# Patient Record
Sex: Female | Born: 1935 | Race: White | Hispanic: No | Marital: Single | Smoking: Never smoker
Health system: Southern US, Community
[De-identification: ages and names within clinical notes are randomized; demographics above are authoritative.]

## PROBLEM LIST (undated history)

## (undated) DIAGNOSIS — K219 Gastro-esophageal reflux disease without esophagitis: Secondary | ICD-10-CM

## (undated) DIAGNOSIS — I4819 Other persistent atrial fibrillation: Secondary | ICD-10-CM

## (undated) DIAGNOSIS — E785 Hyperlipidemia, unspecified: Secondary | ICD-10-CM

## (undated) DIAGNOSIS — R011 Cardiac murmur, unspecified: Secondary | ICD-10-CM

## (undated) DIAGNOSIS — I471 Supraventricular tachycardia, unspecified: Secondary | ICD-10-CM

## (undated) DIAGNOSIS — I5030 Unspecified diastolic (congestive) heart failure: Secondary | ICD-10-CM

## (undated) DIAGNOSIS — E039 Hypothyroidism, unspecified: Secondary | ICD-10-CM

## (undated) DIAGNOSIS — I10 Essential (primary) hypertension: Secondary | ICD-10-CM

## (undated) DIAGNOSIS — M1990 Unspecified osteoarthritis, unspecified site: Secondary | ICD-10-CM

## (undated) DIAGNOSIS — R928 Other abnormal and inconclusive findings on diagnostic imaging of breast: Secondary | ICD-10-CM

## (undated) HISTORY — DX: Unspecified osteoarthritis, unspecified site: M19.90

## (undated) HISTORY — DX: Hypothyroidism, unspecified: E03.9

## (undated) HISTORY — DX: Supraventricular tachycardia, unspecified: I47.10

## (undated) HISTORY — DX: Hyperlipidemia, unspecified: E78.5

## (undated) HISTORY — DX: Unspecified diastolic (congestive) heart failure: I50.30

## (undated) HISTORY — DX: Supraventricular tachycardia: I47.1

## (undated) HISTORY — DX: Other persistent atrial fibrillation: I48.19

## (undated) HISTORY — DX: Essential (primary) hypertension: I10

## (undated) HISTORY — DX: Gastro-esophageal reflux disease without esophagitis: K21.9

## (undated) HISTORY — DX: Other abnormal and inconclusive findings on diagnostic imaging of breast: R92.8

## (undated) HISTORY — DX: Cardiac murmur, unspecified: R01.1

---
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
## 1983-08-16 DIAGNOSIS — R011 Cardiac murmur, unspecified: Secondary | ICD-10-CM

## 1983-08-16 HISTORY — DX: Cardiac murmur, unspecified: R01.1

## 2000-08-15 DIAGNOSIS — M1990 Unspecified osteoarthritis, unspecified site: Secondary | ICD-10-CM

## 2000-08-15 HISTORY — DX: Unspecified osteoarthritis, unspecified site: M19.90

## 2003-08-16 DIAGNOSIS — I10 Essential (primary) hypertension: Secondary | ICD-10-CM

## 2003-08-16 HISTORY — DX: Essential (primary) hypertension: I10

## 2007-08-16 HISTORY — PX: OTHER SURGICAL HISTORY: SHX169

## 2008-08-15 HISTORY — PX: COLONOSCOPY: SHX174

## 2008-11-18 ENCOUNTER — Ambulatory Visit: Payer: Self-pay | Admitting: Cardiology

## 2008-11-18 ENCOUNTER — Ambulatory Visit: Payer: Self-pay | Admitting: Ophthalmology

## 2008-12-01 ENCOUNTER — Ambulatory Visit: Payer: Self-pay | Admitting: Ophthalmology

## 2008-12-13 HISTORY — PX: BREAST SURGERY: SHX581

## 2009-03-16 ENCOUNTER — Ambulatory Visit: Payer: Self-pay | Admitting: Ophthalmology

## 2009-08-15 DIAGNOSIS — R928 Other abnormal and inconclusive findings on diagnostic imaging of breast: Secondary | ICD-10-CM

## 2009-08-15 HISTORY — DX: Other abnormal and inconclusive findings on diagnostic imaging of breast: R92.8

## 2009-09-03 ENCOUNTER — Ambulatory Visit: Payer: Self-pay | Admitting: Unknown Physician Specialty

## 2011-08-16 HISTORY — PX: BREAST BIOPSY: SHX20

## 2011-08-16 HISTORY — PX: BREAST SURGERY: SHX581

## 2012-05-14 ENCOUNTER — Ambulatory Visit: Payer: Self-pay | Admitting: General Surgery

## 2012-05-16 LAB — PATHOLOGY REPORT

## 2012-08-15 HISTORY — PX: BREAST EXCISIONAL BIOPSY: SUR124

## 2012-10-13 ENCOUNTER — Encounter: Payer: Self-pay | Admitting: *Deleted

## 2012-10-13 DIAGNOSIS — I10 Essential (primary) hypertension: Secondary | ICD-10-CM | POA: Insufficient documentation

## 2012-11-22 ENCOUNTER — Encounter: Payer: Self-pay | Admitting: General Surgery

## 2012-11-22 NOTE — Progress Notes (Signed)
Quick Note:  I spoke with the attending radiologist the day of the procedure. She is concerned about the possibility of this malignancy in spite of multiple biopsies. Will review the option for formal excision with needle localization with the patient at her followup appointment. ______

## 2012-11-28 ENCOUNTER — Ambulatory Visit: Payer: Self-pay | Admitting: General Surgery

## 2012-12-05 ENCOUNTER — Ambulatory Visit (INDEPENDENT_AMBULATORY_CARE_PROVIDER_SITE_OTHER): Payer: Medicare Other | Admitting: General Surgery

## 2012-12-05 ENCOUNTER — Encounter: Payer: Self-pay | Admitting: General Surgery

## 2012-12-05 VITALS — BP 138/72 | HR 74 | Resp 16 | Ht 66.0 in | Wt 172.0 lb

## 2012-12-05 DIAGNOSIS — R928 Other abnormal and inconclusive findings on diagnostic imaging of breast: Secondary | ICD-10-CM | POA: Insufficient documentation

## 2012-12-05 NOTE — Patient Instructions (Addendum)
Patient to have left breast surgery to be scheduled.   This patient's surgery has been scheduled for 01-11-13 at [facility]. She is aware of date, time, and instructions.  It is okay for patient to continue 81 mg aspirin.

## 2012-12-05 NOTE — Progress Notes (Signed)
Patient ID: Meagan Wilcox, female   DOB: 07-May-1931, 79 y.o.   MRN: [id number]  Chief Complaint  Patient presents with  . Follow-up    mammogram    HPI GLENDALE Wilcox is a 79 y.o. female who presents for a follow up mammogram. The most recent mammogram was done on 11/21/12 at [facility] with birad category 5. The patient had a left breast stereotactic biopsy done in September 2013 which was benign. The patient does not performs regular self breast checks but does gets regular mammograms done. She denies any pain, swelling, discharge, lumps, or bumps in her breasts at this time. No known family history of breast problems.  HPI  Past Medical History  Diagnosis Date  . Hypertension 2005  . Heart murmur 1985  . Arthritis 2002  . Abnormal mammogram, unspecified 2011    Past Surgical History  Procedure Laterality Date  . Appendectomy  1939  . Colonoscopy  2010    Dr. Mechele Collin  . Cataract surgery  Bilateral 2009  . Left breast biopsy   2009  . Breast surgery Left 2013    stereotactic biopsy     History reviewed. No pertinent family history.  Social History History  Substance Use Topics  . Smoking status: Never Smoker   . Smokeless tobacco: Not on file  . Alcohol Use: No    Allergies  Allergen Reactions  . Amoxicillin Diarrhea  . Penicillins Rash    Current Outpatient Prescriptions  Medication Sig Dispense Refill  . amitriptyline (ELAVIL) 25 MG tablet Take 25 mg by mouth at bedtime.      Marland Kitchen aspirin 81 MG chewable tablet Chew 81 mg by mouth daily.      . Cholecalciferol (VITAMIN D3) 1000 UNIT/SPRAY LIQD Take by mouth.      . levothyroxine (SYNTHROID, LEVOTHROID) 100 MCG tablet Take 100 mcg by mouth daily.      Marland Kitchen omeprazole (PRILOSEC) 20 MG capsule Take 1 capsule by mouth daily.      . pentosan polysulfate (ELMIRON) 100 MG capsule Take 100 mg by mouth 3 (three) times daily before meals.      . simvastatin (ZOCOR) 20 MG tablet Take 20 mg by mouth every evening.      .  valsartan (DIOVAN) 40 MG tablet Take 40 mg by mouth daily.       No current facility-administered medications for this visit.    Review of Systems Review of Systems  Constitutional: Negative.   Respiratory: Negative.   Cardiovascular: Negative.     Blood pressure 138/72, pulse 74, resp. rate 16, height 5\' 6"  (1.676 m), weight 172 lb (78.019 kg).  Physical Exam Physical Exam  Constitutional: She appears well-developed and well-nourished.  Neck: Trachea normal. No mass and no thyromegaly present.  Cardiovascular: Normal rate, regular rhythm, normal heart sounds and normal pulses.   No murmur heard. Pulmonary/Chest: Effort normal and breath sounds normal. Right breast exhibits no inverted nipple, no mass, no nipple discharge, no skin change and no tenderness. Left breast exhibits no inverted nipple, no mass, no nipple discharge, no skin change and no tenderness.  Right breast 1/2 cup size bigger than left.   Lymphadenopathy:    She has no cervical adenopathy.    She has no axillary adenopathy.    Data Reviewed The November 21, 2012 left breast mammogram was reviewed as well as the written pathology report. I had spoken with the attending radiologist the day of the procedure. [id number].  Assessment  Left breast density, concerning for malignancy, possible missed biopsy.     Plan    I've recommended that we go ahead and proceed with a wire localization to confirm or dismiss the concerns for malignancy. The patient is in excellent health, and she is found the stereotactic procedure 3 particularly taxing. We'll have the films printed from [facility] prior to her biopsy.    This patient's surgery has been scheduled for 01-11-13 at [facility]. She is aware of date, time, and instructions. It is okay for patient to continue 81 mg aspirin.    [name] 12/05/2012, 10:00 PM   Cc: Corky Downs, M.D.

## 2012-12-23 ENCOUNTER — Other Ambulatory Visit: Payer: Self-pay | Admitting: General Surgery

## 2012-12-23 DIAGNOSIS — R928 Other abnormal and inconclusive findings on diagnostic imaging of breast: Secondary | ICD-10-CM

## 2012-12-27 ENCOUNTER — Ambulatory Visit: Payer: Self-pay | Admitting: General Surgery

## 2013-01-02 ENCOUNTER — Telehealth: Payer: Self-pay | Admitting: *Deleted

## 2013-01-02 NOTE — Telephone Encounter (Signed)
Message was left for patient to call the office.  We need to reschedule surgery that was scheduled for 01-11-13 at [facility] due to Dr. Rutherford Nail request (injury).

## 2013-01-04 ENCOUNTER — Telehealth: Payer: Self-pay | Admitting: *Deleted

## 2013-01-04 NOTE — Telephone Encounter (Signed)
Patient aware of the need to reschedule surgery per Dr. Rutherford Nail request (due to injury). This patient's surgery has been moved from 01-11-13 to 01-25-13. She is aware of all instructions.  Leah in the [facility] notified as well as Kim in mammography.

## 2013-01-25 DIAGNOSIS — R928 Other abnormal and inconclusive findings on diagnostic imaging of breast: Secondary | ICD-10-CM

## 2013-01-28 ENCOUNTER — Encounter: Payer: Self-pay | Admitting: General Surgery

## 2013-03-05 ENCOUNTER — Encounter: Payer: Self-pay | Admitting: General Surgery

## 2014-02-18 ENCOUNTER — Encounter: Payer: Self-pay | Admitting: General Surgery

## 2014-02-21 ENCOUNTER — Encounter: Payer: Self-pay | Admitting: General Surgery

## 2014-02-24 ENCOUNTER — Ambulatory Visit (INDEPENDENT_AMBULATORY_CARE_PROVIDER_SITE_OTHER): Payer: Medicare Other | Admitting: General Surgery

## 2014-02-24 ENCOUNTER — Other Ambulatory Visit: Payer: Medicare Other

## 2014-02-24 ENCOUNTER — Encounter: Payer: Self-pay | Admitting: General Surgery

## 2014-02-24 VITALS — BP 140/78 | HR 78 | Resp 14 | Ht 66.0 in | Wt 172.0 lb

## 2014-02-24 DIAGNOSIS — N63 Unspecified lump in unspecified breast: Secondary | ICD-10-CM

## 2014-02-24 DIAGNOSIS — R920 Mammographic microcalcification found on diagnostic imaging of breast: Secondary | ICD-10-CM

## 2014-02-24 HISTORY — PX: BREAST BIOPSY: SHX20

## 2014-02-24 NOTE — Progress Notes (Addendum)
Patient ID: Meagan Wilcox, female   DOB: 04-20-1931, 80 y.o.   MRN: [id number]  Chief Complaint  Patient presents with  . Follow-up    mammogram    HPI Meagan Wilcox is a 80 y.o. female. who presents for a breast evaluation. The most recent mammogram was done on 02/11/14 at [facility]. Patient does not perform regular self breast checks but does get regular mammograms done. She denies any problems with the breasts at this time.   The patient was originally evaluated in September 2013 for density in the left breast. Stereotactic biopsy completed 05/14/2012 showed benign tissue with focal fibrosis, columnar cell changes and dilated duct. A postbiopsy imaging suggested that the clip at least was superior to the area of original mammographic concern.  The patient was seen again in April 2014 in regards to an area of architectural distortion in the left breast. Attempt to identify this was unsuccessful as the area of concern could not be identified during standard mammograms for needle localization. At that point it was elected to have a one year followup, culminating in today's exam.  Since her last visit the radiologist reported that there was a developing density in the area of microcalcifications and an ultrasound completed at the imaging facility suggested an area amenable to biopsy. Also, microcalcifications have been identified in the right breast, new from past exams.   HPI  Past Medical History  Diagnosis Date  . Hypertension 2005  . Heart murmur 1985  . Arthritis 2002  . Abnormal mammogram, unspecified 2011    Past Surgical History  Procedure Laterality Date  . Appendectomy  1939  . Colonoscopy  2010    Dr. Mechele Collin  . Cataract surgery  Bilateral 2009  . Left breast biopsy   2009  . Breast surgery Left 2013    stereotactic biopsy     No family history on file.  Social History History  Substance Use Topics  . Smoking status: Never Smoker   . Smokeless tobacco: Not on file  .  Alcohol Use: No    Allergies  Allergen Reactions  . Amoxicillin Diarrhea  . Penicillins Rash    Current Outpatient Prescriptions  Medication Sig Dispense Refill  . amitriptyline (ELAVIL) 25 MG tablet Take 25 mg by mouth at bedtime.      Marland Kitchen aspirin 81 MG chewable tablet Chew 81 mg by mouth daily.      Marland Kitchen levothyroxine (SYNTHROID, LEVOTHROID) 100 MCG tablet Take 100 mcg by mouth daily.      . Multiple Vitamin (MULTIVITAMIN) tablet Take 1 tablet by mouth daily.      Marland Kitchen omeprazole (PRILOSEC) 20 MG capsule Take 1 capsule by mouth daily.      . pentosan polysulfate (ELMIRON) 100 MG capsule Take 100 mg by mouth 2 (two) times daily.       . simvastatin (ZOCOR) 20 MG tablet Take 20 mg by mouth every evening.      . valsartan (DIOVAN) 40 MG tablet Take 40 mg by mouth daily.       No current facility-administered medications for this visit.    Review of Systems Review of Systems  Constitutional: Negative.   Respiratory: Negative.   Cardiovascular: Negative.     Blood pressure 140/78, pulse 78, resp. rate 14, height 5\' 6"  (1.676 m), weight 172 lb (78.019 kg).  Physical Exam Physical Exam  Constitutional: She is oriented to person, place, and time. She appears well-developed and well-nourished.  Neck: Neck supple. No thyromegaly present.  Cardiovascular: Normal rate and regular rhythm.   Murmur heard.  Systolic murmur is present with a grade of 1/6  Pulmonary/Chest: Effort normal and breath sounds normal. Right breast exhibits no inverted nipple, no mass, no nipple discharge, no skin change and no tenderness. Left breast exhibits no inverted nipple, no mass, no nipple discharge, no skin change and no tenderness.  Lymphadenopathy:    She has no cervical adenopathy.    She has no axillary adenopathy.  Neurological: She is alert and oriented to person, place, and time.  Skin: Skin is warm and dry.    Data Reviewed Bilateral mammograms dated 02/11/2014 were reviewed. Architectural  distortion  Assessment    Right breast examination showed microcalcifications in the upper-outer quadrant and determined suspicion for malignancy. These were not evident on her 2014 study.  Left breast examination suggested a spiculated mass with an ultrasound correlation.    Plan    Ultrasound examination of the left breast at the 6:00 position, 4 cm from the nipple showed an area of hypoechoic tissue measuring 1 x 1 x 0.76 cm. The patient was amenable to a vacuum biopsy. 10 cc of 0.5% Xylocaine with 0.25% Marcaine with 1 200,000 units epinephrine was utilized for local anesthesia well tolerated. A 10-gauge Encor device was then advanced through the area an 8 core samples obtained. A small fluid collection was noted postbiopsy and this was controlled with direct pressure with no subsequent hematoma. Postbiopsy clip was placed. The skin defect was closed with benzoin and Steri-Strips followed by Telfa and Tegaderm dressing.  The patient was provided with written instructions in regards to wound care postbiopsy. Depending on pathology, will discuss proceeding with right breast stereotactic biopsy.      PCP: Meryle ReadyMasoud, Javed    Gisel Vipond W 02/25/2014, 3:28 PM

## 2014-02-24 NOTE — Patient Instructions (Signed)

## 2014-02-25 ENCOUNTER — Encounter: Payer: Self-pay | Admitting: General Surgery

## 2014-02-25 DIAGNOSIS — R920 Mammographic microcalcification found on diagnostic imaging of breast: Secondary | ICD-10-CM | POA: Insufficient documentation

## 2014-02-26 ENCOUNTER — Telehealth: Payer: Self-pay | Admitting: General Surgery

## 2014-02-26 ENCOUNTER — Telehealth: Payer: Self-pay | Admitting: *Deleted

## 2014-02-26 ENCOUNTER — Encounter: Payer: Self-pay | Admitting: General Surgery

## 2014-02-26 ENCOUNTER — Ambulatory Visit (INDEPENDENT_AMBULATORY_CARE_PROVIDER_SITE_OTHER): Payer: Self-pay | Admitting: General Surgery

## 2014-02-26 VITALS — BP 126/70 | HR 80 | Resp 12 | Ht 66.0 in | Wt 171.0 lb

## 2014-02-26 DIAGNOSIS — R920 Mammographic microcalcification found on diagnostic imaging of breast: Secondary | ICD-10-CM

## 2014-02-26 DIAGNOSIS — N63 Unspecified lump in unspecified breast: Secondary | ICD-10-CM

## 2014-02-26 LAB — PATHOLOGY

## 2014-02-26 NOTE — Telephone Encounter (Signed)
Pathology benign. Patient reports a few drops of blood. She will come in this afternoon for a wound check and to discuss contralateral biopsy.

## 2014-02-26 NOTE — Progress Notes (Signed)
Patient ID: Silvestre Mesilsie M Erpelding, female   DOB: 01-11-1931, 80 y.o.   MRN: [id number]  Chief Complaint  Patient presents with  . Follow-up    HPI Silvestre Mesilsie M Bouchillon is a 80 y.o. female.Pt had a biopsy on left breast on Monday July 13th, when she puts on her bra, each morning she has a little bit of blood on it, she stated not much just a few drops.   HPI  Past Medical History  Diagnosis Date  . Hypertension 2005  . Heart murmur 1985  . Arthritis 2002  . Abnormal mammogram, unspecified 2011    Past Surgical History  Procedure Laterality Date  . Appendectomy  1939  . Colonoscopy  2010    Dr. Mechele CollinElliott  . Cataract surgery  Bilateral 2009  . Left breast biopsy   2009  . Breast surgery Left 2013    stereotactic biopsy   . Breast surgery Left May 2010    1 mm foci of atypical ductal hyperplasia  . Breast biopsy Left 02-24-14    No family history on file.  Social History History  Substance Use Topics  . Smoking status: Never Smoker   . Smokeless tobacco: Not on file  . Alcohol Use: No    Allergies  Allergen Reactions  . Amoxicillin Diarrhea  . Penicillins Rash    Current Outpatient Prescriptions  Medication Sig Dispense Refill  . amitriptyline (ELAVIL) 25 MG tablet Take 25 mg by mouth at bedtime.      Marland Kitchen. aspirin 81 MG chewable tablet Chew 81 mg by mouth daily.      Marland Kitchen. levothyroxine (SYNTHROID, LEVOTHROID) 100 MCG tablet Take 100 mcg by mouth daily.      . Multiple Vitamin (MULTIVITAMIN) tablet Take 1 tablet by mouth daily.      Marland Kitchen. omeprazole (PRILOSEC) 20 MG capsule Take 1 capsule by mouth daily.      . pentosan polysulfate (ELMIRON) 100 MG capsule Take 100 mg by mouth 2 (two) times daily.       . simvastatin (ZOCOR) 20 MG tablet Take 20 mg by mouth every evening.      . valsartan (DIOVAN) 40 MG tablet Take 40 mg by mouth daily.       No current facility-administered medications for this visit.    Review of Systems Review of Systems  Constitutional: Negative.   Respiratory:  Negative.     Blood pressure 126/70, pulse 80, resp. rate 12, height 5\' 6"  (1.676 m), weight 171 lb (77.565 kg).  Physical Exam Physical Exam  Constitutional: She is oriented to person, place, and time. She appears well-developed and well-nourished.  Pulmonary/Chest:  Significant amount of bruising noted left breast  Neurological: She is alert and oriented to person, place, and time.  Skin: Skin is warm and dry.    Data Reviewed Diagnosis: LEFT BREAST 6:00, BIOPSY: - BENIGN BREAST TISSUE WITH COLUMNAR CELL CHANGE, APOCRINE METAPLASIA, AND FOCAL HYALINE FIBROSIS. - AREAS OF FIBROADIPOSE TISSUE, COULD BE COMPATIBLE WITH LIPOMA DEPENDING ON THE CLINICAL IMPRESSION. - NEGATIVE FOR ATYPIA AND MALIGNANCY. Comment These findings [facility]. Arvella NighByrnetts office at 11:40 AM on 02/26/14. RUB/02/26/2014    Assessment    Benign biopsy, unable to confirm mammographic abnormality due to his extensive bruising    Plan    Biopsy of the right breast microcalcifications in postbiopsy mammogram on the left will need to be postponed pending resolution of the significant bruising and ecchymosis noted today.  Arrangements will made for followup examination  one month. The patient is encouraged to make use of local heat for comfort and more rapid resolution of the anastomosis.      PCP: Meryle Ready 02/27/2014, 3:27 PM

## 2014-02-26 NOTE — Patient Instructions (Addendum)
Continue self breast exams. Call office for any new breast issues or concerns. Use heating pad to left breast to help reduce hematoma and for comfort. Wear bra for comfort and support

## 2014-02-26 NOTE — Telephone Encounter (Signed)
Pt had a BX on LT Side on Monday July 13th, when she puts on her bra, each morning she has a little bit of blood on it, she stated not much just a few drops. She was just wondering is this okay or should she come in to have it looked at.

## 2014-02-26 NOTE — Telephone Encounter (Signed)
appt for 1:30 today

## 2014-02-27 DIAGNOSIS — N63 Unspecified lump in unspecified breast: Secondary | ICD-10-CM | POA: Insufficient documentation

## 2014-03-26 ENCOUNTER — Ambulatory Visit (INDEPENDENT_AMBULATORY_CARE_PROVIDER_SITE_OTHER): Payer: Self-pay | Admitting: General Surgery

## 2014-03-26 ENCOUNTER — Encounter: Payer: Self-pay | Admitting: General Surgery

## 2014-03-26 VITALS — BP 140/72 | Wt 171.0 lb

## 2014-03-26 DIAGNOSIS — R920 Mammographic microcalcification found on diagnostic imaging of breast: Secondary | ICD-10-CM

## 2014-03-26 NOTE — Patient Instructions (Signed)
Patient advised to use a heating pad on the left breast to help resolve hematoma twice daily. Patient to return in December 2015. The patient is aware to call back for any questions or concerns.

## 2014-03-26 NOTE — Progress Notes (Signed)
Patient ID: Meagan Wilcox, female   DOB: 1931-02-02, 80 y.o.   MRN: [id number]  Chief Complaint  Patient presents with  . Follow-up    1 month follow up post op left breast biopsy    HPI Meagan Wilcox is a 80 y.o. female who presents for a post op left breast biopsy. The procedure was performed on 02/24/14. She states she is still having some tenderness in the area of the biopsy. No problems at this time.    HPI  Past Medical History  Diagnosis Date  . Hypertension 2005  . Heart murmur 1985  . Arthritis 2002  . Abnormal mammogram, unspecified 2011    Past Surgical History  Procedure Laterality Date  . Appendectomy  1939  . Colonoscopy  2010    Dr. Mechele Collin  . Cataract surgery  Bilateral 2009  . Left breast biopsy   2009  . Breast surgery Left 2013    stereotactic biopsy   . Breast surgery Left May 2010    1 mm foci of atypical ductal hyperplasia  . Breast biopsy Left 02-24-14    History reviewed. No pertinent family history.  Social History History  Substance Use Topics  . Smoking status: Never Smoker   . Smokeless tobacco: Not on file  . Alcohol Use: No    Allergies  Allergen Reactions  . Amoxicillin Diarrhea  . Penicillins Rash    Current Outpatient Prescriptions  Medication Sig Dispense Refill  . amitriptyline (ELAVIL) 25 MG tablet Take 25 mg by mouth at bedtime.      Marland Kitchen aspirin 81 MG chewable tablet Chew 81 mg by mouth daily.      Marland Kitchen levothyroxine (SYNTHROID, LEVOTHROID) 100 MCG tablet Take 100 mcg by mouth daily.      . Multiple Vitamin (MULTIVITAMIN) tablet Take 1 tablet by mouth daily.      Marland Kitchen omeprazole (PRILOSEC) 20 MG capsule Take 1 capsule by mouth daily.      . pentosan polysulfate (ELMIRON) 100 MG capsule Take 100 mg by mouth 2 (two) times daily.       . simvastatin (ZOCOR) 20 MG tablet Take 20 mg by mouth every evening.      . valsartan (DIOVAN) 40 MG tablet Take 40 mg by mouth daily.       No current facility-administered medications for this  visit.    Review of Systems Review of Systems  Constitutional: Negative.   Respiratory: Negative.   Cardiovascular: Negative.     Blood pressure 140/72, weight 171 lb (77.565 kg).  Physical Exam Physical Exam  Constitutional: She is oriented to person, place, and time. She appears well-developed and well-nourished.  Neck: Neck supple. No thyromegaly present.  Cardiovascular: Normal rate, regular rhythm and normal heart sounds.   No murmur heard. Pulmonary/Chest: Effort normal and breath sounds normal. Left breast exhibits tenderness. Left breast exhibits no inverted nipple, no mass, no nipple discharge and no skin change.    5 x 6 cm hematoma on the left breast at biopsy site.    Lymphadenopathy:    She has no cervical adenopathy.  Neurological: She is alert and oriented to person, place, and time.  Skin: Skin is warm and dry.    Data Reviewed LEFT BREAST 6:00, BIOPSY: - BENIGN BREAST TISSUE WITH COLUMNAR CELL CHANGE, APOCRINE METAPLASIA, AND FOCAL HYALINE FIBROSIS. - AREAS OF FIBROADIPOSE TISSUE, COULD BE COMPATIBLE WITH LIPOMA DEPENDING ON THE CLINICAL IMPRESSION. - NEGATIVE FOR ATYPIA AND MALIGNANCY. Comment These findings were communicated to  Michelle in Dr. Arvella NighByrnetts office at 11:40 AM on 02/26/14.    Assessment    Hematoma post biopsy.     Plan    The presence of the hematoma precludes a post biopsy mammogram to confirm the area of microcalcifications had been removed. We'll postpone reassessment until December 2015. We'll reassess at that time for post biopsy mammogram. The patient has been encouraged to resume local heat applications to assist with resolution of the hematoma.    PCP: Meryle ReadyMasoud, Javed    Zoye Chandra W 03/27/2014, 9:00 PM

## 2014-04-08 ENCOUNTER — Encounter: Payer: Self-pay | Admitting: General Surgery

## 2014-06-16 ENCOUNTER — Encounter: Payer: Self-pay | Admitting: General Surgery

## 2014-07-29 ENCOUNTER — Ambulatory Visit (INDEPENDENT_AMBULATORY_CARE_PROVIDER_SITE_OTHER): Payer: Medicare Other | Admitting: General Surgery

## 2014-07-29 ENCOUNTER — Encounter: Payer: Self-pay | Admitting: General Surgery

## 2014-07-29 VITALS — BP 160/80 | HR 64 | Resp 12 | Ht 66.0 in | Wt 174.0 lb

## 2014-07-29 DIAGNOSIS — R920 Mammographic microcalcification found on diagnostic imaging of breast: Secondary | ICD-10-CM

## 2014-07-29 NOTE — Patient Instructions (Addendum)
Continue self breast exams. Call office for any new breast issues or concerns. Follow up in 6 months with bilateral screening mammograms and office visits.

## 2014-07-29 NOTE — Progress Notes (Signed)
Patient ID: Meagan Wilcox, female   DOB: 1931-06-11, 80 y.o.   MRN: [id number]  Chief Complaint  Patient presents with  . Follow-up    HPI Meagan Mesilsie M Bidinger is a 80 y.o. female.  Here today for her follow up left breast biopsy on 02-24-14. She states the pain is usually at night 1/10 but it doesn't last long.   HPI  Past Medical History  Diagnosis Date  . Hypertension 2005  . Heart murmur 1985  . Arthritis 2002  . Abnormal mammogram, unspecified 2011    Past Surgical History  Procedure Laterality Date  . Appendectomy  1939  . Colonoscopy  2010    Dr. Mechele CollinElliott  . Cataract surgery  Bilateral 2009  . Left breast biopsy   2009  . Breast surgery Left 2013    stereotactic biopsy   . Breast surgery Left May 2010    1 mm foci of atypical ductal hyperplasia  . Breast biopsy Left 02-24-14    benign    No family history on file.  Social History History  Substance Use Topics  . Smoking status: Never Smoker   . Smokeless tobacco: Not on file  . Alcohol Use: No    Allergies  Allergen Reactions  . Amoxicillin Diarrhea  . Penicillins Rash    Current Outpatient Prescriptions  Medication Sig Dispense Refill  . amitriptyline (ELAVIL) 25 MG tablet Take 25 mg by mouth at bedtime.    Marland Kitchen. aspirin 81 MG chewable tablet Chew 81 mg by mouth daily.    Marland Kitchen. levothyroxine (SYNTHROID, LEVOTHROID) 100 MCG tablet Take 100 mcg by mouth daily.    . Multiple Vitamin (MULTIVITAMIN) tablet Take 1 tablet by mouth daily.    Marland Kitchen. omeprazole (PRILOSEC) 20 MG capsule Take 1 capsule by mouth daily.    . pentosan polysulfate (ELMIRON) 100 MG capsule Take 100 mg by mouth 2 (two) times daily.     . simvastatin (ZOCOR) 20 MG tablet Take 20 mg by mouth every evening.    . valsartan (DIOVAN) 40 MG tablet Take 40 mg by mouth daily.     No current facility-administered medications for this visit.    Review of Systems Review of Systems  Constitutional: Negative.   Respiratory: Negative.   Cardiovascular: Negative.      Blood pressure 160/80, pulse 64, resp. rate 12, height 5\' 6"  (1.676 m), weight 174 lb (78.926 kg).  Physical Exam Physical Exam  Constitutional: She is oriented to person, place, and time. She appears well-developed and well-nourished.  Neck: Neck supple.  Cardiovascular: Normal rate, regular rhythm and normal heart sounds.   Pulmonary/Chest: Effort normal and breath sounds normal. Right breast exhibits no inverted nipple, no mass, no nipple discharge, no skin change and no tenderness. Left breast exhibits tenderness. Left breast exhibits no inverted nipple, no mass, no nipple discharge and no skin change.    Tender at biopsy site left breast, left outer quadrant with 3 cm area of thickening.  Lymphadenopathy:    She has no cervical adenopathy.    She has no axillary adenopathy.  Neurological: She is alert and oriented to person, place, and time.  Skin: Skin is warm and dry.    Data Reviewed Diagnosis: LEFT BREAST 6:00, BIOPSY: - BENIGN BREAST TISSUE WITH COLUMNAR CELL CHANGE, APOCRINE METAPLASIA, AND FOCAL HYALINE FIBROSIS. - AREAS OF FIBROADIPOSE TISSUE, COULD BE COMPATIBLE WITH LIPOMA DEPENDING ON THE CLINICAL IMPRESSION. - NEGATIVE FOR ATYPIA AND MALIGNANCY.  Assessment    Benign breast exam.  Plan    The patient is still too tender to consider postbiopsy mammography. We'll plan for a screening exam in 6 months and reassess at that time.     Follow up in 6 months with bilateral screening mammograms and office visits.  PCP:  Meryle ReadyMasoud, Javed  Crist Kruszka W 07/30/2014, 5:58 AM

## 2014-12-18 ENCOUNTER — Other Ambulatory Visit: Payer: Self-pay

## 2014-12-18 DIAGNOSIS — Z1231 Encounter for screening mammogram for malignant neoplasm of breast: Secondary | ICD-10-CM

## 2015-02-09 ENCOUNTER — Encounter: Payer: Self-pay | Admitting: General Surgery

## 2015-02-10 ENCOUNTER — Encounter: Payer: Self-pay | Admitting: General Surgery

## 2015-02-10 ENCOUNTER — Ambulatory Visit (INDEPENDENT_AMBULATORY_CARE_PROVIDER_SITE_OTHER): Payer: Medicare Other | Admitting: General Surgery

## 2015-02-10 VITALS — BP 158/74 | HR 80 | Resp 14 | Ht 63.0 in | Wt 172.0 lb

## 2015-02-10 DIAGNOSIS — N63 Unspecified lump in unspecified breast: Secondary | ICD-10-CM

## 2015-02-10 DIAGNOSIS — R920 Mammographic microcalcification found on diagnostic imaging of breast: Secondary | ICD-10-CM | POA: Diagnosis not present

## 2015-02-10 NOTE — Progress Notes (Signed)
Patient ID: Meagan Wilcox, female   DOB: 1931-06-29, 81 y.o.   MRN: [id number]  Chief Complaint  Patient presents with  . Follow-up    mammogram    HPI Meagan Mesilsie M Norland is a 81 y.o. female who presents for a breast evaluation. The most recent mammogram was done on 02/02/15. She states she had added views and ultrasound 02-06-15. The patient had developed extensive ecchymosis after her vacuum biopsy. Patient does perform regular self breast checks and gets regular mammograms done.  She states the left breast is still tender but not any worse than it was in past.  HPI  Past Medical History  Diagnosis Date  . Hypertension 2005  . Heart murmur 1985  . Arthritis 2002  . Abnormal mammogram, unspecified 2011    Past Surgical History  Procedure Laterality Date  . Appendectomy  1939  . Colonoscopy  2010    Dr. Mechele CollinElliott  . Cataract surgery  Bilateral 2009  . Left breast biopsy   2009  . Breast surgery Left 2013    stereotactic biopsy   . Breast surgery Left May 2010    1 mm foci of atypical ductal hyperplasia  . Breast biopsy Left 02-24-14    benign    No family history on file.  Social History History  Substance Use Topics  . Smoking status: Never Smoker   . Smokeless tobacco: Never Used  . Alcohol Use: No    Allergies  Allergen Reactions  . Amoxicillin Diarrhea  . Penicillins Rash    Current Outpatient Prescriptions  Medication Sig Dispense Refill  . amitriptyline (ELAVIL) 25 MG tablet Take 25 mg by mouth at bedtime.    Marland Kitchen. aspirin 81 MG chewable tablet Chew 81 mg by mouth daily.    . Biotin (BIOTIN 5000) 5 MG CAPS Take by mouth daily.    Marland Kitchen. levothyroxine (SYNTHROID, LEVOTHROID) 100 MCG tablet Take 100 mcg by mouth daily.    . Multiple Vitamin (MULTIVITAMIN) tablet Take 1 tablet by mouth daily.    Marland Kitchen. omeprazole (PRILOSEC) 20 MG capsule Take 1 capsule by mouth daily.    . pentosan polysulfate (ELMIRON) 100 MG capsule Take 100 mg by mouth 2 (two) times daily.     . simvastatin  (ZOCOR) 20 MG tablet Take 20 mg by mouth every evening.    . valsartan (DIOVAN) 40 MG tablet Take 40 mg by mouth daily.     No current facility-administered medications for this visit.    Review of Systems Review of Systems  Constitutional: Negative.   Respiratory: Negative.   Cardiovascular: Negative.     Blood pressure 158/74, pulse 80, resp. rate 14, height 5\' 3"  (1.6 m), weight 172 lb (78.019 kg).  Physical Exam Physical Exam  Constitutional: She is oriented to person, place, and time. She appears well-developed and well-nourished.  HENT:  Mouth/Throat: Oropharynx is clear and moist.  Eyes: Conjunctivae are normal. No scleral icterus.  Neck: Neck supple.  Cardiovascular: Normal rate, regular rhythm and normal heart sounds.   Pulmonary/Chest: Effort normal and breath sounds normal. Right breast exhibits no inverted nipple, no mass, no nipple discharge, no skin change and no tenderness. Left breast exhibits tenderness. Left breast exhibits no inverted nipple, no mass, no nipple discharge and no skin change.  Tenderness at 6 o'clock 5 CFN left breast.  Lymphadenopathy:    She has no cervical adenopathy.    She has no axillary adenopathy.  Neurological: She is alert and oriented to person, place, and time.  Skin: Skin is warm and dry.  Psychiatric: She has a normal mood and affect.    Data Reviewed July 2015:  LEFT BREAST 6:00, BIOPSY: - BENIGN BREAST TISSUE WITH COLUMNAR CELL CHANGE, APOCRINE METAPLASIA, AND FOCAL HYALINE FIBROSIS. - AREAS OF FIBROADIPOSE TISSUE, COULD BE COMPATIBLE WITH LIPOMA DEPENDING ON THE CLINICAL IMPRESSION. - NEGATIVE FOR ATYPIA AND MALIGNANCY. Comment 2013 biopsy showed columnar cell changes without atypia.  2014/2015 mammograms recent area of persistent concern in the inferior aspect of the breast. Repeat attempted image guided biopsy showed no discernible lesion.  Recent mammogram dated 02/06/2015 completed at [facility] was reviewed.  Ultrasound reported persistent once I meter density at the 6:00 position with distortion less well appreciated. BI-RADS-4.  Assessment    No significant interval change in mammogram. Benign clinical exam.    Plan    Rose and cons of repeat biopsy were reviewed. At this time we'll plan for a follow-up 6 month mammogram.    Review previous mammograms to make sure the biopsy site was indeed the location of the mass.  The patient has been asked to return to the office in six months with a unilateral left breast diagnostic mammogram.  PCP:  Meryle Ready 02/11/2015, 8:39 PM

## 2015-02-10 NOTE — Patient Instructions (Signed)
Continue self breast exams. Call office for any new breast issues or concerns. The patient has been asked to return to the office in six months with a unilateral left breast diagnostic mammogram.

## 2015-08-05 ENCOUNTER — Encounter: Payer: Self-pay | Admitting: General Surgery

## 2015-08-06 ENCOUNTER — Ambulatory Visit (INDEPENDENT_AMBULATORY_CARE_PROVIDER_SITE_OTHER): Payer: Medicare Other | Admitting: General Surgery

## 2015-08-06 ENCOUNTER — Other Ambulatory Visit: Payer: Medicare Other

## 2015-08-06 ENCOUNTER — Encounter: Payer: Self-pay | Admitting: General Surgery

## 2015-08-06 VITALS — BP 148/74 | HR 74 | Resp 14 | Ht 64.0 in | Wt 171.0 lb

## 2015-08-06 DIAGNOSIS — N644 Mastodynia: Secondary | ICD-10-CM

## 2015-08-06 DIAGNOSIS — N63 Unspecified lump in breast: Secondary | ICD-10-CM | POA: Diagnosis not present

## 2015-08-06 DIAGNOSIS — N6320 Unspecified lump in the left breast, unspecified quadrant: Secondary | ICD-10-CM

## 2015-08-06 NOTE — Patient Instructions (Addendum)
The patient will be scheduled for a left stereotactic breast biopsy.   Patient has been scheduled for a left breast stere[facility] for [facility]31-15 at 4 pm. She will [facility] at [facility] at 3:30 pm. This patient is aware of date, time, and instructions. Patient verbalizes understanding.

## 2015-08-06 NOTE — Progress Notes (Signed)
Patient ID: Meagan Wilcox, female   DOB: June 02, 1931, 81 y.o.   MRN: [id number]  Chief Complaint  Patient presents with  . Follow-up    mammogram    HPI Meagan Mesilsie M Suire is a 81 y.o. female who presents for a breast evaluation. The most recent left breast mammogram was done on 07/2015.  Patient does perform regular self breast checks and gets regular mammograms done.    HPI  Past Medical History  Diagnosis Date  . Hypertension 2005  . Heart murmur 1985  . Arthritis 2002  . Abnormal mammogram, unspecified 2011    Past Surgical History  Procedure Laterality Date  . Appendectomy  1939  . Colonoscopy  2010    Dr. Mechele CollinElliott  . Cataract surgery  Bilateral 2009  . Left breast biopsy   2009  . Breast surgery Left 2013    stereotactic biopsy   . Breast surgery Left May 2010    1 mm foci of atypical ductal hyperplasia  . Breast biopsy Left 02-24-14    benign    No family history on file.  Social History Social History  Substance Use Topics  . Smoking status: Never Smoker   . Smokeless tobacco: Never Used  . Alcohol Use: No    Allergies  Allergen Reactions  . Amoxicillin Diarrhea  . Penicillins Rash    Current Outpatient Prescriptions  Medication Sig Dispense Refill  . amitriptyline (ELAVIL) 25 MG tablet Take 25 mg by mouth at bedtime.    Marland Kitchen. aspirin 81 MG chewable tablet Chew 81 mg by mouth daily.    Marland Kitchen. azithromycin (ZITHROMAX) 250 MG tablet     . Biotin (BIOTIN 5000) 5 MG CAPS Take by mouth daily.    Marland Kitchen. levothyroxine (SYNTHROID, LEVOTHROID) 100 MCG tablet Take 100 mcg by mouth daily.    . Multiple Vitamin (MULTIVITAMIN) tablet Take 1 tablet by mouth daily.    Marland Kitchen. omeprazole (PRILOSEC) 20 MG capsule Take 1 capsule by mouth daily.    . pentosan polysulfate (ELMIRON) 100 MG capsule Take 100 mg by mouth 2 (two) times daily.     . simvastatin (ZOCOR) 20 MG tablet Take 20 mg by mouth every evening.     No current facility-administered medications for this visit.    Review of  Systems Review of Systems  Constitutional: Negative.   Respiratory: Negative.   Cardiovascular: Negative.     Blood pressure 148/74, pulse 74, resp. rate 14, height 5\' 4"  (1.626 m), weight 171 lb (77.565 kg).  Physical Exam Physical Exam  Constitutional: She is oriented to person, place, and time. She appears well-developed and well-nourished.  Eyes: Conjunctivae are normal. No scleral icterus.  Neck: Neck supple.  Cardiovascular: Normal rate, regular rhythm and normal heart sounds.   Pulmonary/Chest: Effort normal and breath sounds normal. Right breast exhibits no inverted nipple, no mass, no nipple discharge, no skin change and no tenderness. Left breast exhibits no inverted nipple, no mass, no nipple discharge, no skin change and no tenderness.  Lymphadenopathy:    She has no cervical adenopathy.    She has no axillary adenopathy.  Neurological: She is alert and oriented to person, place, and time.  Skin: Skin is warm and dry.    Data Reviewed Left breast mammogram dated 08/04/2015 completed at [facility] was reviewed. Persistent and more prominent asymmetry in the inferior aspect of the breast. Not associated with any previously placed biopsy clips. BI-RADS-4.  Ultrasound examination of the left breast from the 3 to 6:00 position  was completed. No cystic or solid lesions were identified to correlate with the mammographic abnormality. BI-RADS-4.   Assessment    Persistent density, still likely related to previous hematoma.    Plan    Biopsy is been recommended using stereotactic technique. By index of suspicion is low but with the persistent and perhaps slightly more pronounced density noted (possibly related to resolution of the swelling with hematoma which was extensive) warrants a final answer. The patient will be scheduled for a left stereotactic breast biopsy.   Patient has been scheduled for a left breast stereotactic biopsy at [facility] for 08-31-15 at 4 pm. She will  check-in at the [facility] at 3:30 pm. This patient is aware of date, time, and instructions. Patient verbalizes understanding. This patient has been asked to discontinue aspirin 7 days prior to procedure.     PCP:  Corky Downs This information has been scribed by Ples Specter CMA.      [name] 08/07/2015, 12:09 PM

## 2015-08-07 ENCOUNTER — Other Ambulatory Visit: Payer: Self-pay | Admitting: General Surgery

## 2015-08-07 DIAGNOSIS — N6320 Unspecified lump in the left breast, unspecified quadrant: Secondary | ICD-10-CM | POA: Insufficient documentation

## 2015-08-31 ENCOUNTER — Other Ambulatory Visit: Payer: Self-pay | Admitting: General Surgery

## 2015-08-31 ENCOUNTER — Ambulatory Visit
Admission: RE | Admit: 2015-08-31 | Discharge: 2015-08-31 | Disposition: A | Discharge status: Home / Self Care | Payer: Medicare Other | Source: Ambulatory Visit | Attending: General Surgery | Admitting: General Surgery

## 2015-08-31 DIAGNOSIS — N6320 Unspecified lump in the left breast, unspecified quadrant: Secondary | ICD-10-CM

## 2015-08-31 DIAGNOSIS — R920 Mammographic microcalcification found on diagnostic imaging of breast: Secondary | ICD-10-CM | POA: Diagnosis not present

## 2015-08-31 DIAGNOSIS — N63 Unspecified lump in breast: Secondary | ICD-10-CM | POA: Insufficient documentation

## 2015-08-31 NOTE — Op Note (Signed)
STEREOTACTIC BREAST BIOPSY REPORT  Name:  [name] [facility] DOB:  1931-01-22  Vital signs:There were no vitals taken for this visit.  Lesion:  Nodular density.  Location:  Left breast, lower inner quadrant.   Prep:  ChloraPrep solution  Device:  40F Hologic device utilized  Local anesthetic:   20 cc's of 0.5% Xylocaine/0.25% Marcaine with 1:200,000 epinephrine and NaH2CO3   Patient tolerance:  Patient tolerated the procedure well.   Approach: Inferior`  Clip: Top hat  Dressing: Telfa/Tegaderm.  Ice pack applied. Written instructions provided to patient regarding wound care.   Patient advised that patient will be contacted by phone when pathology report is available.  Follow up appointment has been scheduled.      Meagan Wilcox

## 2015-09-01 ENCOUNTER — Telehealth: Payer: Self-pay | Admitting: General Surgery

## 2015-09-01 LAB — SURGICAL PATHOLOGY

## 2015-09-01 NOTE — Telephone Encounter (Signed)
The patient was notified that the biopsy completed yesterday was benign. The pathologist raised the question of a hamartoma, and I think this goes along with my clinical impression of residual changes from a hematoma. There was stromal fibrosis but no epithelial elements.  The patient reports no bruising but she has noted saturation of the gauze dressing applied yesterday. She will remove this tonight and continue to wear her bra for support.  Follow-up as previously scheduled.

## 2015-09-07 ENCOUNTER — Ambulatory Visit (INDEPENDENT_AMBULATORY_CARE_PROVIDER_SITE_OTHER): Payer: Medicare Other

## 2015-09-07 DIAGNOSIS — R920 Mammographic microcalcification found on diagnostic imaging of breast: Secondary | ICD-10-CM

## 2015-09-07 NOTE — Progress Notes (Signed)
Patient ID: Meagan Wilcox, female   DOB: June 18, 1931, 82 y.o.   MRN: [id number] Patient came in today for a wound check post stereo biopsy. The patient is aware of her pathology results. The wound is clean, with no signs of infection noted, small amount of bruising noted. Follow up as scheduled.

## 2016-02-11 HISTORY — PX: BREAST BIOPSY: SHX20

## 2016-03-14 ENCOUNTER — Other Ambulatory Visit: Payer: Self-pay | Admitting: Internal Medicine

## 2016-04-14 ENCOUNTER — Other Ambulatory Visit: Payer: Self-pay | Admitting: Internal Medicine

## 2016-05-18 ENCOUNTER — Other Ambulatory Visit: Payer: Self-pay | Admitting: Internal Medicine

## 2016-05-18 DIAGNOSIS — Z13820 Encounter for screening for osteoporosis: Secondary | ICD-10-CM

## 2016-06-01 ENCOUNTER — Telehealth: Payer: Self-pay

## 2016-06-01 NOTE — Telephone Encounter (Signed)
[facility]. She doesn't want to travel to [place].

## 2016-06-01 NOTE — Telephone Encounter (Signed)
The patient will have her mammogram done at [place].  

## 2016-06-08 ENCOUNTER — Other Ambulatory Visit: Payer: Self-pay

## 2016-06-08 DIAGNOSIS — Z1231 Encounter for screening mammogram for malignant neoplasm of breast: Secondary | ICD-10-CM

## 2016-06-15 ENCOUNTER — Other Ambulatory Visit: Payer: Self-pay | Admitting: *Deleted

## 2016-06-15 ENCOUNTER — Ambulatory Visit
Admission: RE | Admit: 2016-06-15 | Discharge: 2016-06-15 | Disposition: A | Discharge status: Home / Self Care | Payer: Self-pay | Source: Ambulatory Visit | Attending: *Deleted | Admitting: *Deleted

## 2016-06-15 DIAGNOSIS — Z9289 Personal history of other medical treatment: Secondary | ICD-10-CM

## 2016-06-21 ENCOUNTER — Ambulatory Visit
Admission: RE | Admit: 2016-06-21 | Discharge: 2016-06-21 | Disposition: A | Discharge status: Home / Self Care | Payer: Medicare Other | Source: Ambulatory Visit | Attending: Internal Medicine | Admitting: Internal Medicine

## 2016-06-21 DIAGNOSIS — M810 Age-related osteoporosis without current pathological fracture: Secondary | ICD-10-CM | POA: Diagnosis not present

## 2016-06-21 DIAGNOSIS — Z13820 Encounter for screening for osteoporosis: Secondary | ICD-10-CM | POA: Diagnosis not present

## 2016-07-26 ENCOUNTER — Ambulatory Visit
Admission: RE | Admit: 2016-07-26 | Discharge: 2016-07-26 | Disposition: A | Discharge status: Home / Self Care | Payer: Medicare Other | Source: Ambulatory Visit | Attending: General Surgery | Admitting: General Surgery

## 2016-07-26 DIAGNOSIS — Z1231 Encounter for screening mammogram for malignant neoplasm of breast: Secondary | ICD-10-CM

## 2016-08-10 ENCOUNTER — Encounter: Payer: Self-pay | Admitting: *Deleted

## 2016-08-18 ENCOUNTER — Ambulatory Visit: Payer: Medicare Other | Admitting: General Surgery

## 2016-08-30 ENCOUNTER — Encounter: Payer: Self-pay | Admitting: General Surgery

## 2016-08-30 ENCOUNTER — Ambulatory Visit (INDEPENDENT_AMBULATORY_CARE_PROVIDER_SITE_OTHER): Payer: Medicare Other | Admitting: General Surgery

## 2016-08-30 VITALS — BP 140/82 | HR 78 | Resp 14 | Ht 64.0 in | Wt 171.0 lb

## 2016-08-30 DIAGNOSIS — N6092 Unspecified benign mammary dysplasia of left breast: Secondary | ICD-10-CM | POA: Diagnosis not present

## 2016-08-30 NOTE — Patient Instructions (Addendum)
The patient is aware to call back for any questions or concerns. Patient will be asked to return to the office in one year with a bilateral screening mammogra

## 2016-08-30 NOTE — Progress Notes (Signed)
Patient ID: Meagan Mesilsie M Alas, female   DOB: September 18, 1930, 83 y.o.   MRN: [id number]  Chief Complaint  Patient presents with  . Follow-up    HPI Meagan Wilcox is a 83 y.o. female.  who presents for a breast evaluation. The most recent mammogram was done on 07-26-16.  Patient does perform regular self breast checks and gets regular mammograms done.   No new breast issues, occasional left lateral breast pain with changing positions, which is unchanged from before.  HPI  Past Medical History:  Diagnosis Date  . Abnormal mammogram, unspecified 2011  . Arthritis 2002  . Heart murmur 1985  . Hypertension 2005    Past Surgical History:  Procedure Laterality Date  . APPENDECTOMY  1939  . BREAST BIOPSY Left 02-24-14   benign  . BREAST BIOPSY Left 02/11/2016   neg  . BREAST BIOPSY Left 2013   neg  . BREAST EXCISIONAL BIOPSY Left 2014   neg  . BREAST SURGERY Left 2013   stereotactic biopsy   . BREAST SURGERY Left May 2010   1 mm foci of atypical ductal hyperplasia  . cataract surgery  Bilateral 2009  . COLONOSCOPY  2010   Dr. Mechele CollinElliott  . left breast biopsy   2009    No family history on file.  Social History Social History  Substance Use Topics  . Smoking status: Never Smoker  . Smokeless tobacco: Never Used  . Alcohol use No    Allergies  Allergen Reactions  . Amoxicillin Diarrhea  . Penicillins Rash    Current Outpatient Prescriptions  Medication Sig Dispense Refill  . amitriptyline (ELAVIL) 25 MG tablet Take 25 mg by mouth at bedtime.    Marland Kitchen. aspirin 81 MG chewable tablet Chew 81 mg by mouth daily.    . Biotin (BIOTIN 5000) 5 MG CAPS Take by mouth daily.    . Cholecalciferol (VITAMIN D3) 1000 units CAPS Take by mouth.    . levothyroxine (SYNTHROID, LEVOTHROID) 100 MCG tablet Take 100 mcg by mouth daily.    Marland Kitchen. omeprazole (PRILOSEC) 20 MG capsule Take 1 capsule by mouth daily.    . pentosan polysulfate (ELMIRON) 100 MG capsule Take 100 mg by mouth 2 (two) times daily.      . simvastatin (ZOCOR) 20 MG tablet Take 20 mg by mouth every evening.     No current facility-administered medications for this visit.     Review of Systems Review of Systems  Constitutional: Negative.   Respiratory: Negative.   Cardiovascular: Negative.     Blood pressure 140/82, pulse 78, resp. rate 14, height 5\' 4"  (1.626 m), weight 171 lb (77.6 kg).  Physical Exam Physical Exam  Constitutional: She is oriented to person, place, and time. She appears well-developed and well-nourished.  HENT:  Mouth/Throat: Oropharynx is clear and moist.  Eyes: Conjunctivae are normal. No scleral icterus.  Neck: Neck supple. Carotid bruit is not present.  Cardiovascular: Normal rate, regular rhythm and normal heart sounds.   Pulmonary/Chest: Effort normal and breath sounds normal. Right breast exhibits no inverted nipple, no mass, no nipple discharge, no skin change and no tenderness. Left breast exhibits no inverted nipple, no mass, no nipple discharge, no skin change and no tenderness.  Lymphadenopathy:    She has no cervical adenopathy.    She has no axillary adenopathy.  Neurological: She is alert and oriented to person, place, and time.  Skin: Skin is warm and dry.  Psychiatric: Her behavior is normal.    Data  Reviewed 07/26/2016 screening mammograms reviewed. BI-RADS-1.  Assessment    Unremarkable breast exam.    Plan    Repeat clinical exam and screening mammograms 1 year recommended. Patient desires to have this completed through this office.    Patient will be asked to return to the office in one year with a bilateral screening mammogram.   This information has been scribed by Dorathy Daft RN, BSN,BC.   [name] 08/31/2016, 1:38 PM

## 2016-08-31 DIAGNOSIS — N6092 Unspecified benign mammary dysplasia of left breast: Secondary | ICD-10-CM | POA: Insufficient documentation

## 2017-02-09 IMAGING — MG MM DIGITAL SCREENING BILAT W/ CAD
4 series · 4 of 4 positions shown · non-contrast
Comparison: Previous exam(s).

CLINICAL DATA: Screening.

EXAM:
DIGITAL SCREENING BILATERAL MAMMOGRAM WITH CAD

[R MLO]
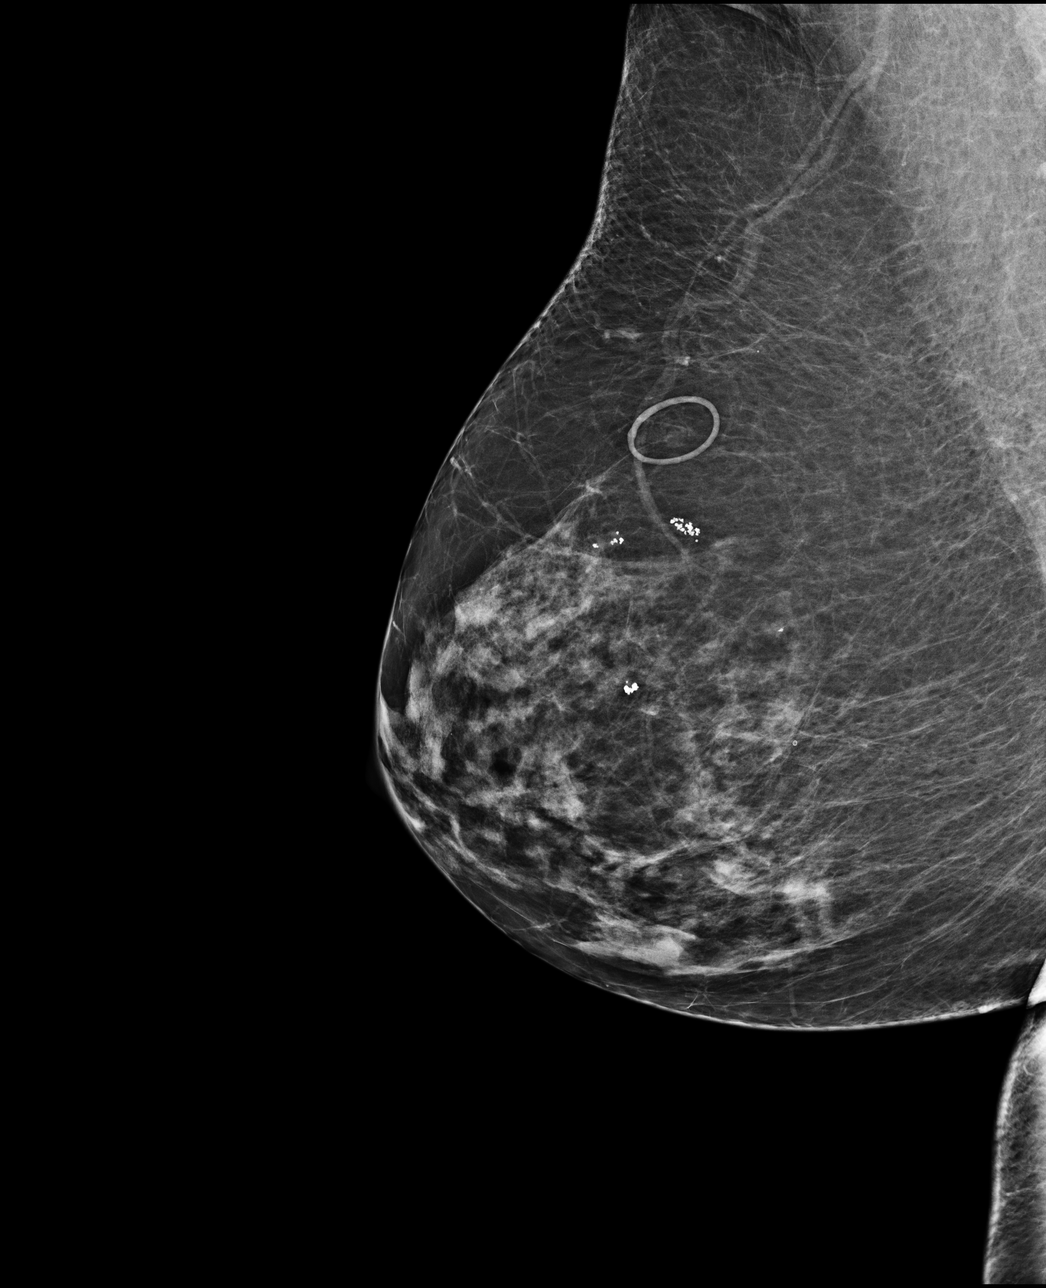

[L CC]
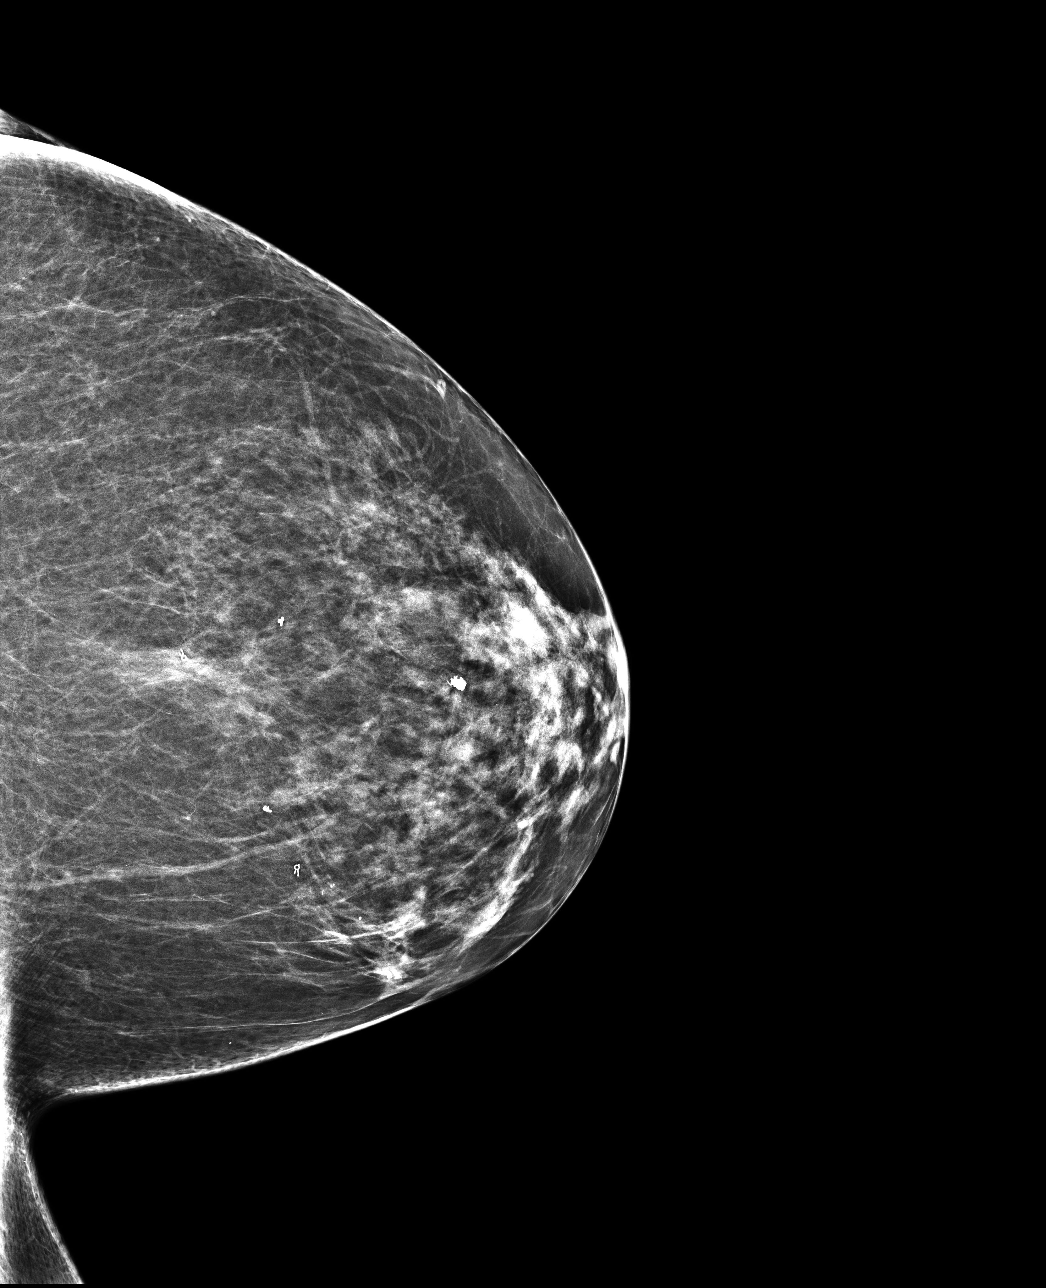

[R CC]
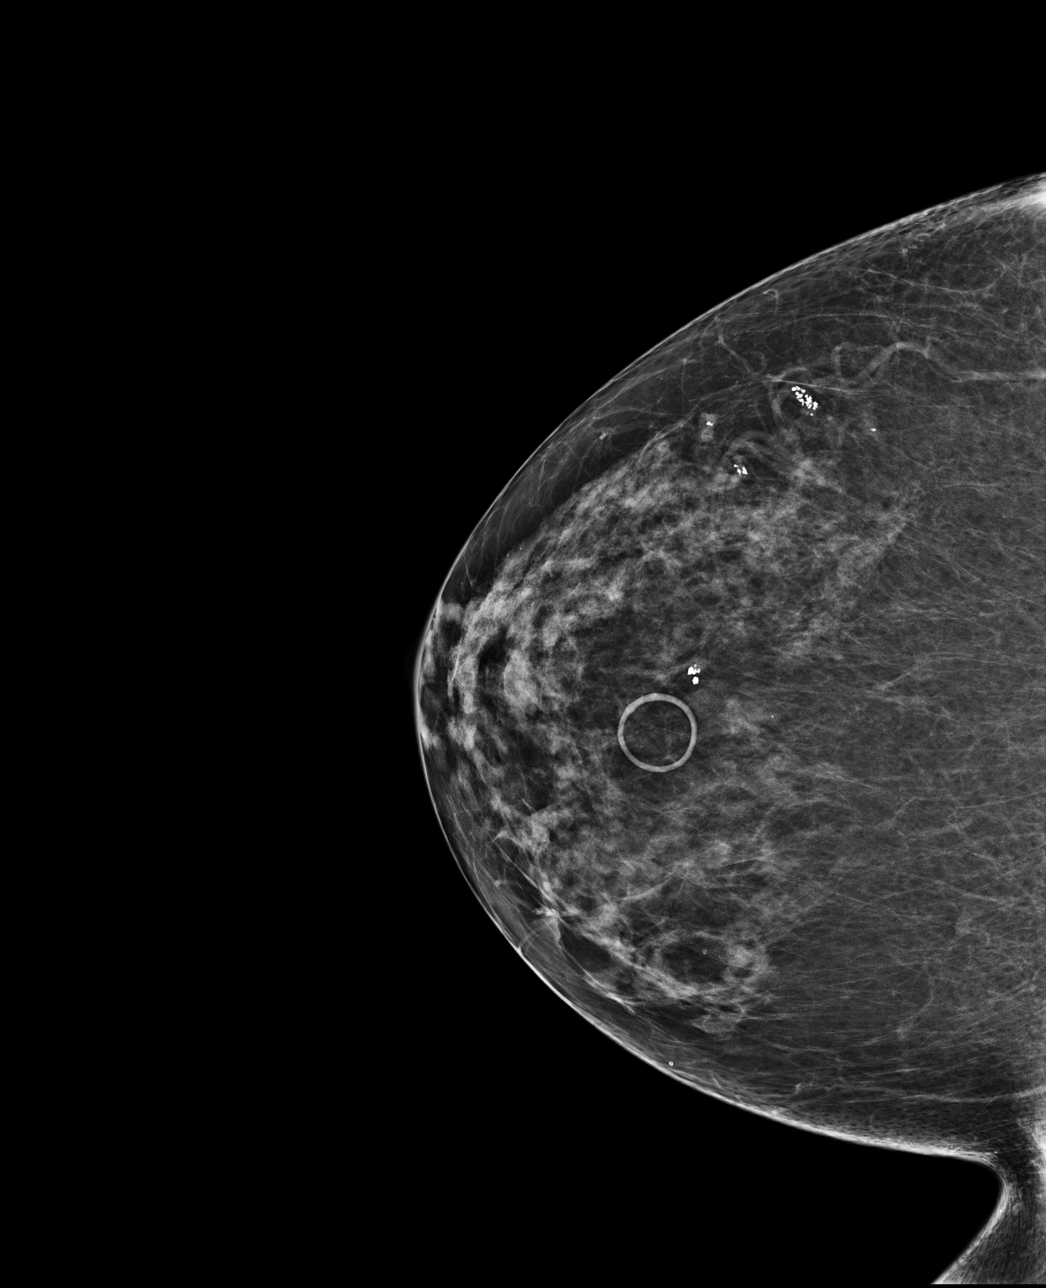

[L MLO]
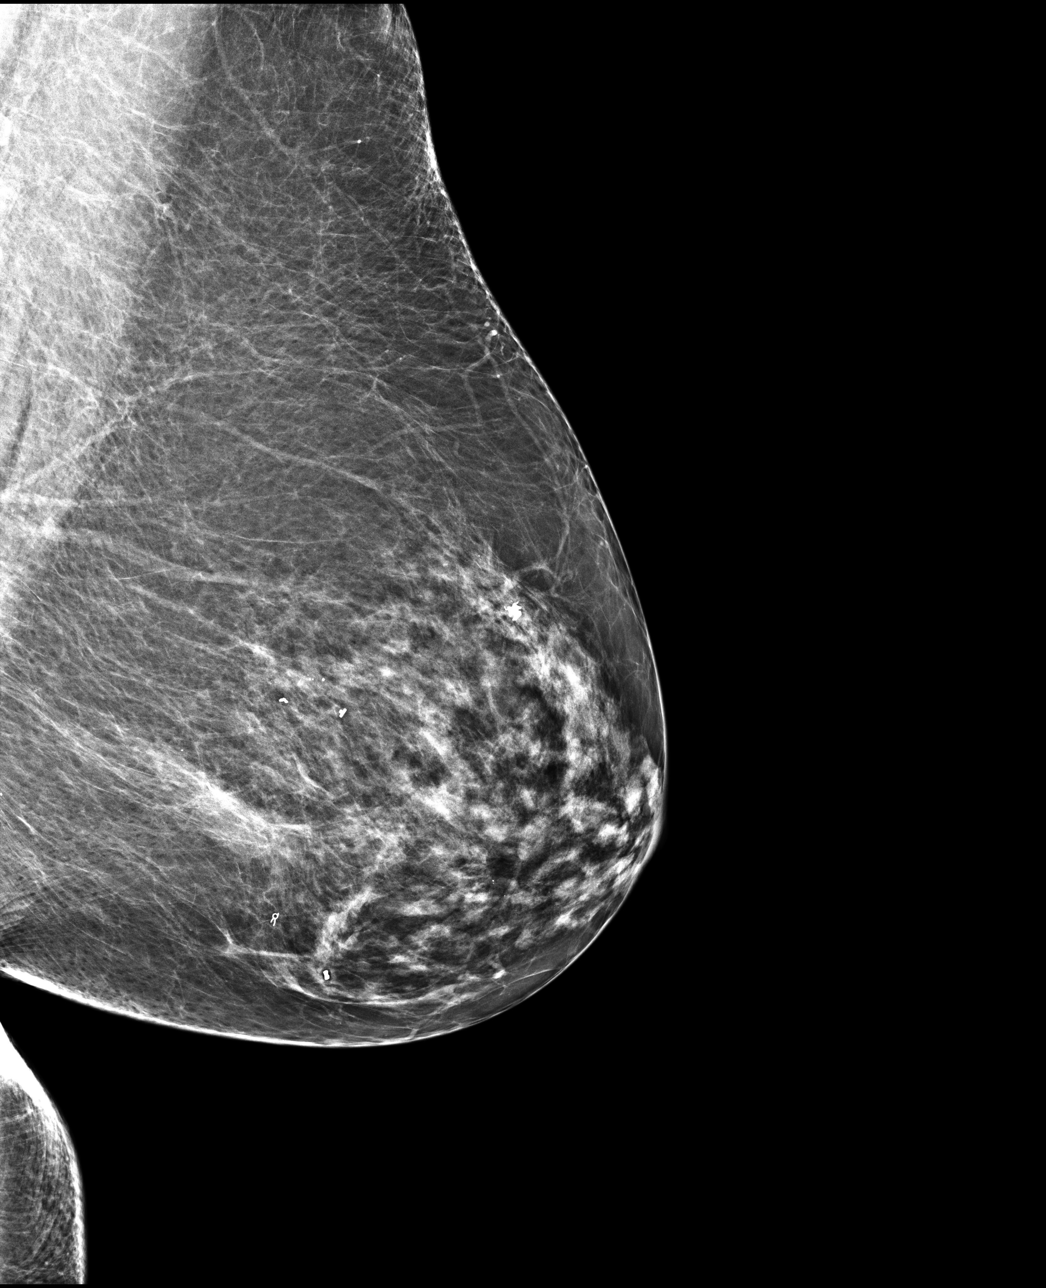

[4 of 4 positions shown; findings below may reference images not displayed]

ACR Breast Density Category b: There are scattered areas of
fibroglandular density.
FINDINGS: There are no findings suspicious for malignancy. Images were
processed with CAD.
IMPRESSION: No mammographic evidence of malignancy. A result letter of this
screening mammogram will be mailed directly to the patient.

RECOMMENDATION:
Screening mammogram in one year. (Code:AS-G-LCT)

BI-RADS CATEGORY  1: Negative.

## 2017-05-02 ENCOUNTER — Other Ambulatory Visit: Payer: Self-pay | Admitting: Student

## 2017-05-02 DIAGNOSIS — M5442 Lumbago with sciatica, left side: Secondary | ICD-10-CM

## 2017-05-04 ENCOUNTER — Ambulatory Visit
Admission: RE | Admit: 2017-05-04 | Discharge: 2017-05-04 | Disposition: A | Discharge status: Home / Self Care | Payer: Medicare Other | Source: Ambulatory Visit | Attending: Student | Admitting: Student

## 2017-05-04 DIAGNOSIS — M48061 Spinal stenosis, lumbar region without neurogenic claudication: Secondary | ICD-10-CM | POA: Insufficient documentation

## 2017-05-04 DIAGNOSIS — M5442 Lumbago with sciatica, left side: Secondary | ICD-10-CM | POA: Insufficient documentation

## 2017-05-04 DIAGNOSIS — M5136 Other intervertebral disc degeneration, lumbar region: Secondary | ICD-10-CM | POA: Insufficient documentation

## 2017-05-05 ENCOUNTER — Ambulatory Visit: Payer: Medicare Other

## 2017-05-17 ENCOUNTER — Ambulatory Visit (HOSPITAL_BASED_OUTPATIENT_CLINIC_OR_DEPARTMENT_OTHER): Payer: Medicare Other | Admitting: Student in an Organized Health Care Education/Training Program

## 2017-05-17 ENCOUNTER — Ambulatory Visit
Admission: RE | Admit: 2017-05-17 | Discharge: 2017-05-17 | Disposition: A | Discharge status: Home / Self Care | Payer: Medicare Other | Source: Ambulatory Visit | Attending: Student in an Organized Health Care Education/Training Program | Admitting: Student in an Organized Health Care Education/Training Program

## 2017-05-17 ENCOUNTER — Encounter: Payer: Self-pay | Admitting: Student in an Organized Health Care Education/Training Program

## 2017-05-17 VITALS — BP 164/59 | HR 64 | Temp 98.3°F | Resp 12 | Ht 63.0 in | Wt 170.0 lb

## 2017-05-17 DIAGNOSIS — M9983 Other biomechanical lesions of lumbar region: Secondary | ICD-10-CM

## 2017-05-17 DIAGNOSIS — M25562 Pain in left knee: Secondary | ICD-10-CM | POA: Diagnosis not present

## 2017-05-17 DIAGNOSIS — M5416 Radiculopathy, lumbar region: Secondary | ICD-10-CM | POA: Diagnosis not present

## 2017-05-17 DIAGNOSIS — M25561 Pain in right knee: Secondary | ICD-10-CM | POA: Insufficient documentation

## 2017-05-17 DIAGNOSIS — G894 Chronic pain syndrome: Secondary | ICD-10-CM | POA: Insufficient documentation

## 2017-05-17 DIAGNOSIS — Z7982 Long term (current) use of aspirin: Secondary | ICD-10-CM | POA: Insufficient documentation

## 2017-05-17 DIAGNOSIS — Z880 Allergy status to penicillin: Secondary | ICD-10-CM | POA: Insufficient documentation

## 2017-05-17 DIAGNOSIS — M5116 Intervertebral disc disorders with radiculopathy, lumbar region: Secondary | ICD-10-CM | POA: Insufficient documentation

## 2017-05-17 DIAGNOSIS — M545 Low back pain: Secondary | ICD-10-CM | POA: Diagnosis present

## 2017-05-17 DIAGNOSIS — Z79899 Other long term (current) drug therapy: Secondary | ICD-10-CM | POA: Diagnosis not present

## 2017-05-17 DIAGNOSIS — M5136 Other intervertebral disc degeneration, lumbar region: Secondary | ICD-10-CM

## 2017-05-17 DIAGNOSIS — M48061 Spinal stenosis, lumbar region without neurogenic claudication: Secondary | ICD-10-CM | POA: Diagnosis not present

## 2017-05-17 MED ORDER — LIDOCAINE HCL (PF) 1 % IJ SOLN
10.0000 mL | Freq: Once | INTRAMUSCULAR | Status: AC
  Administered 2017-05-17: 5 mL

## 2017-05-17 MED ORDER — LIDOCAINE HCL (PF) 1 % IJ SOLN
INTRAMUSCULAR | Status: AC
  Filled 2017-05-17: qty 5

## 2017-05-17 MED ORDER — IOPAMIDOL (ISOVUE-M 200) INJECTION 41%
INTRAMUSCULAR | Status: AC
  Filled 2017-05-17: qty 10

## 2017-05-17 MED ORDER — SODIUM CHLORIDE 0.9% FLUSH
2.0000 mL | Freq: Once | INTRAVENOUS | Status: AC
  Administered 2017-05-17: 10 mL

## 2017-05-17 MED ORDER — ROPIVACAINE HCL 2 MG/ML IJ SOLN
INTRAMUSCULAR | Status: AC
  Filled 2017-05-17: qty 10

## 2017-05-17 MED ORDER — IOPAMIDOL (ISOVUE-M 200) INJECTION 41%
10.0000 mL | Freq: Once | INTRAMUSCULAR | Status: AC
  Administered 2017-05-17: 10 mL via EPIDURAL
  Filled 2017-05-17: qty 10

## 2017-05-17 MED ORDER — DEXAMETHASONE SODIUM PHOSPHATE 10 MG/ML IJ SOLN
INTRAMUSCULAR | Status: AC
  Filled 2017-05-17: qty 1

## 2017-05-17 MED ORDER — SODIUM CHLORIDE 0.9 % IJ SOLN
INTRAMUSCULAR | Status: AC
  Filled 2017-05-17: qty 10

## 2017-05-17 MED ORDER — DEXAMETHASONE SODIUM PHOSPHATE 10 MG/ML IJ SOLN
10.0000 mg | Freq: Once | INTRAMUSCULAR | Status: AC
  Administered 2017-05-17: 10 mg

## 2017-05-17 MED ORDER — ROPIVACAINE HCL 2 MG/ML IJ SOLN
2.0000 mL | Freq: Once | INTRAMUSCULAR | Status: AC
  Administered 2017-05-17: 10 mL via EPIDURAL

## 2017-05-17 NOTE — Progress Notes (Signed)
Patient's Name: Meagan Wilcox  MRN: [id number]  Referring Provider: Corky Downs, MD  DOB: 11/26/1930  PCP: Corky Downs, MD  DOS: 05/17/2017  Note by: Edward Jolly, MD  Service setting: Ambulatory outpatient  Specialty: Interventional Pain Management  Patient type: Established  Location: [facility] (AMB) Pain Management Facility  Visit type: Interventional Procedure   Primary Reason for Visit: Interventional Pain Management Treatment. CC: Back Pain (low and mostley on the left); Leg Pain (left and lateral); and Knee Pain (bilateral, left is worse)  Procedure:  Anesthesia, Analgesia, Anxiolysis:  Type: Therapeutic Inter-Laminar Epidural Steroid Injection Region: Lumbar Level: L5-S1 Level. Laterality: Left         Type: Local Anesthesia Local Anesthetic: Lidocaine 1% Route: Infiltration (Rachel/IM) IV Access: Secured Sedation: Meaningful verbal contact was maintained at all times during the procedure  Indication(s): Analgesia and Anxiety   Indications: 1. Lumbar radiculopathy   2. Lumbar degenerative disc disease   3. Neuroforaminal stenosis of lumbar spine   4. Chronic pain syndrome    Pain Score: Pre-procedure: 3 /10 Post-procedure: 3 /10  Pre-op Assessment:  Meagan Wilcox is a 83 y.o. (year old), female patient, seen today for interventional treatment. She  has a past surgical history that includes Appendectomy (1939); Colonoscopy (2010); cataract surgery  (Bilateral, 2009); left breast biopsy  (2009); Breast surgery (Left, 2013); Breast surgery (Left, May 2010); Breast biopsy (Left, 02-24-14); Breast biopsy (Left, 02/11/2016); Breast excisional biopsy (Left, 2014); and Breast biopsy (Left, 2013). Meagan Wilcox has a current medication list which includes the following prescription(s): amitriptyline, aspirin, vitamin d3, gabapentin, levothyroxine, metaxalone, omeprazole, pentosan polysulfate, simvastatin, and biotin. Her primarily concern today is the Back Pain (low and mostley on the left); Leg Pain  (left and lateral); and Knee Pain (bilateral, left is worse)  Initial Vital Signs: Blood pressure (!) 168/59, pulse 64, temperature 98.3 F (36.8 C), temperature source Oral, resp. rate 18, height  (1.6 m), weight 170 lb (77.1 kg), SpO2 100 %. BMI: Estimated body mass index is 30.11 kg/m as calculated from the following:   Height as of this encounter:  (1.6 m).   Weight as of this encounter: 170 lb (77.1 kg).  Risk Assessment: Allergies: Reviewed. She is allergic to amoxicillin and penicillins.  Allergy Precautions: None required Coagulopathies: Reviewed. None identified.  Blood-thinner therapy: None at this time Active Infection(s): Reviewed. None identified. Meagan Wilcox is afebrile  Site Confirmation: Meagan Wilcox was asked to confirm the procedure and laterality before marking the site Procedure checklist: Completed Consent: Before the procedure and under the influence of no sedative(s), amnesic(s), or anxiolytics, the patient was informed of the treatment options, risks and possible complications. To fulfill our ethical and legal obligations, as recommended by the American Medical Association's Code of Ethics, I have informed the patient of my clinical impression; the nature and purpose of the treatment or procedure; the risks, benefits, and possible complications of the intervention; the alternatives, including doing nothing; the risk(s) and benefit(s) of the alternative treatment(s) or procedure(s); and the risk(s) and benefit(s) of doing nothing. The patient was provided information about the general risks and possible complications associated with the procedure. These may include, but are not limited to: failure to achieve desired goals, infection, bleeding, organ or nerve damage, allergic reactions, paralysis, and death. In addition, the patient was informed of those risks and complications associated to Spine-related procedures, such as failure to decrease pain; infection (i.e.:  Meningitis, epidural or intraspinal abscess); bleeding (i.e.: epidural hematoma, subarachnoid hemorrhage, or  any other type of intraspinal or peri-dural bleeding); organ or nerve damage (i.e.: Any type of peripheral nerve, nerve root, or spinal cord injury) with subsequent damage to sensory, motor, and/or autonomic systems, resulting in permanent pain, numbness, and/or weakness of one or several areas of the body; allergic reactions; (i.e.: anaphylactic reaction); and/or death. Furthermore, the patient was informed of those risks and complications associated with the medications. These include, but are not limited to: allergic reactions (i.e.: anaphylactic or anaphylactoid reaction(s)); adrenal axis suppression; blood sugar elevation that in diabetics may result in ketoacidosis or comma; water retention that in patients with history of congestive heart failure may result in shortness of breath, pulmonary edema, and decompensation with resultant heart failure; weight gain; swelling or edema; medication-induced neural toxicity; particulate matter embolism and blood vessel occlusion with resultant organ, and/or nervous system infarction; and/or aseptic necrosis of one or more joints. Finally, the patient was informed that Medicine is not an exact science; therefore, there is also the possibility of unforeseen or unpredictable risks and/or possible complications that may result in a catastrophic outcome. The patient indicated having understood very clearly. We have given the patient no guarantees and we have made no promises. Enough time was given to the patient to ask questions, all of which were answered to the patient's satisfaction. Meagan Wilcox has indicated that she wanted to continue with the procedure. Attestation: I, the ordering provider, attest that I have discussed with the patient the benefits, risks, side-effects, alternatives, likelihood of achieving goals, and potential problems during recovery for the  procedure that I have provided informed consent. Date: 05/17/2017; Time: 9:57 AM  Pre-Procedure Preparation:  Monitoring: As per clinic protocol. Respiration, ETCO2, SpO2, BP, heart rate and rhythm monitor placed and checked for adequate function Safety Precautions: Patient was assessed for positional comfort and pressure points before starting the procedure. Time-out: I initiated and conducted the "Time-out" before starting the procedure, as per protocol. The patient was asked to participate by confirming the accuracy of the "Time Out" information. Verification of the correct person, site, and procedure were performed and confirmed by me, the nursing staff, and the patient. "Time-out" conducted as per Joint Commission's Universal Protocol (UP.01.01.01). "Time-out" Date & Time: 05/17/2017; 1033 hrs.  Description of Procedure Process:   Position: Prone with head of the table was raised to facilitate breathing. Target Area: The interlaminar space, initially targeting the lower laminar border of the superior vertebral body. Approach: Paramedial approach. Area Prepped: Entire Posterior Lumbar Region Prepping solution: ChloraPrep (2% chlorhexidine gluconate and 70% isopropyl alcohol) Safety Precautions: Aspiration looking for blood return was conducted prior to all injections. At no point did we inject any substances, as a needle was being advanced. No attempts were made at seeking any paresthesias. Safe injection practices and needle disposal techniques used. Medications properly checked for expiration dates. SDV (single dose vial) medications used. Description of the Procedure: Protocol guidelines were followed. The procedure needle was introduced through the skin, ipsilateral to the reported pain, and advanced to the target area. Bone was contacted and the needle walked caudad, until the lamina was cleared. The epidural space was identified using "loss-of-resistance technique" with 2-3 ml of PF-NaCl  (0.9% NSS), in a 5cc LOR glass syringe. Vitals:   05/17/17 1032 05/17/17 1033 05/17/17 1037 05/17/17 1042  BP: (!) 167/71 (!) 155/61 (!) 161/60 (!) 164/59  Pulse:      Resp: Temp:      TempSrc:  SpO2: 97% 97% 98% 97%  Weight:      Height:        Start Time: 1033 hrs. End Time: 1043 hrs. Materials:  Needle(s) Type: Epidural needle Gauge: 17G Length: 3.5-in Medication(s): We administered iopamidol, ropivacaine (PF) 2 mg/mL (0.2%), sodium chloride flush, lidocaine (PF), and dexamethasone. Please see chart orders for dosing details. 5 cc of normal saline, 2 cc of 0.2% ropivacaine, 1 cc of Decadron 10 mg/cc Imaging Guidance (Spinal):  Type of Imaging Technique: Fluoroscopy Guidance (Spinal) Indication(s): Assistance in needle guidance and placement for procedures requiring needle placement in or near specific anatomical locations not easily accessible without such assistance. Exposure Time: Please see nurses notes. Contrast: Before injecting any contrast, we confirmed that the patient did not have an allergy to iodine, shellfish, or radiological contrast. Once satisfactory needle placement was completed at the desired level, radiological contrast was injected. Contrast injected under live fluoroscopy. No contrast complications. See chart for type and volume of contrast used. Fluoroscopic Guidance: I was personally present during the use of fluoroscopy. "Tunnel Vision Technique" used to obtain the best possible view of the target area. Parallax error corrected before commencing the procedure. "Direction-depth-direction" technique used to introduce the needle under continuous pulsed fluoroscopy. Once target was reached, antero-posterior, oblique, and lateral fluoroscopic projection used confirm needle placement in all planes. Images permanently stored in EMR. Interpretation: I personally interpreted the imaging intraoperatively. Adequate needle placement confirmed in multiple  planes. Appropriate spread of contrast into desired area was observed. No evidence of afferent or efferent intravascular uptake. No intrathecal or subarachnoid spread observed. Permanent images saved into the patient's record.  Antibiotic Prophylaxis:  Indication(s): None identified Antibiotic given: None  Post-operative Assessment:  EBL: None Complications: No immediate post-treatment complications observed by team, or reported by patient. Note: The patient tolerated the entire procedure well. A repeat set of vitals were taken after the procedure and the patient was kept under observation following institutional policy, for this type of procedure. Post-procedural neurological assessment was performed, showing return to baseline, prior to discharge. The patient was provided with post-procedure discharge instructions, including a section on how to identify potential problems. Should any problems arise concerning this procedure, the patient was given instructions to immediately contact us, at any time, without hesitation. In any case, we plan to contact the patient by telephone for a follow-up status report regarding this interventional procedure. Comments:  No additional relevant information.  Plan of Care  5 out of 5 strength left lower extremity: Plantar flexion, dorsiflexion, knee flexion, knee extension on discharge.  Imaging Orders     DG C-Arm 1-60 Min-No Report  Procedure Orders     Lumbar Epidural Injection  Medications ordered for procedure: Meds ordered this encounter  Medications  . iopamidol (ISOVUE-M) 41 % intrathecal injection 10 mL  . ropivacaine (PF) 2 mg/mL (0.2%) (NAROPIN) injection 2 mL  . sodium chloride flush (NS) 0.9 % injection 2 mL  . lidocaine (PF) (XYLOCAINE) 1 % injection 10 mL  . dexamethasone (DECADRON) injection 10 mg   Medications administered: We administered iopamidol, ropivacaine (PF) 2 mg/mL (0.2%), sodium chloride flush, lidocaine (PF), and  dexamethasone.  See the medical record for exact dosing, route, and time of administration.  New Prescriptions   No medications on file   Disposition: Discharge home  Discharge Date & Time: 05/17/2017; 1052 hrs.   Physician-requested Follow-up: Return in about 2 weeks (around 05/31/2017) for Post Procedure Evaluation. Future Appointments Date Time Provider Department Center  05/31/2017  11:45 AM Edward Jolly, MD [facility] None   Primary Care Physician: Corky Downs, MD Location: [facility] Note by: Edward Jolly, MD Date: 05/17/2017; Time: 2:25 PM  Disclaimer:  Medicine is not an exact science. The only guarantee in medicine is that nothing is guaranteed. It is important to note that the decision to proceed with this intervention was based on the information collected from the patient. The Data and conclusions were drawn from the patient's questionnaire, the interview, and the physical examination. Because the information was provided in large part by the patient, it cannot be guaranteed that it has not been purposely or unconsciously manipulated. Every effort has been made to obtain as much relevant data as possible for this evaluation. It is important to note that the conclusions that lead to this procedure are derived in large part from the available data. Always take into account that the treatment will also be dependent on availability of resources and existing treatment guidelines, considered by other Pain Management Practitioners as being common knowledge and practice, at the time of the intervention. For Medico-Legal purposes, it is also important to point out that variation in procedural techniques and pharmacological choices are the acceptable norm. The indications, contraindications, technique, and results of the above procedure should only be interpreted and judged by a Board-Certified Interventional Pain Specialist with extensive familiarity and expertise  in the same exact procedure and technique.

## 2017-05-17 NOTE — Patient Instructions (Signed)
Post-procedure Information What to expect: Most procedures involve the use of a local anesthetic (numbing medicine), and a steroid (anti-inflammatory medicine).  The local anesthetics may cause temporary numbness and weakness of the legs or arms, depending on the location of the block. This numbness/weakness may last 4-6 hours, depending on the local anesthetic used. In rare instances, it can last up to 24 hours. While numb, you must be very careful not to injure the extremity.  After any procedure, you could expect the pain to get better within 15-20 minutes. This relief is temporary and may last 4-6 hours. Once the local anesthetics wears off, you could experience discomfort, possibly more than usual, for up to 10 (ten) days. In the case of radiofrequencies, it may last up to 6 weeks. Surgeries may take up to 8 weeks for the healing process. The discomfort is due to the irritation caused by needles going through skin and muscle. To minimize the discomfort, we recommend using ice the first day, and heat from then on. The ice should be applied for 15 minutes on, and 15 minutes off. Keep repeating this cycle until bedtime. Avoid applying the ice directly to the skin, to prevent frostbite. Heat should be used daily, until the pain improves (4-10 days). Be careful not to burn yourself.  Occasionally you may experience muscle spasms or cramps. These occur as a consequence of the irritation caused by the needle sticks to the muscle and the blood that will inevitably be lost into the surrounding muscle tissue. Blood tends to be very irritating to tissues, which tend to react by going into spasm. These spasms may start the same day of your procedure, but they may also take days to develop. This late onset type of spasm or cramp is usually caused by electrolyte imbalances triggered by the steroids, at the level of the kidney. Cramps and spasms tend to respond well to muscle relaxants, multivitamins (some are  triggered by the procedure, but may have their origins in vitamin deficiencies), and "Gatorade", or any sports drinks that can replenish any electrolyte imbalances. (If you are a diabetic, ask your pharmacist to get you a sugar-free brand.) Warm showers or baths may also be helpful. Stretching exercises are highly recommended. General Instructions:  Be alert for signs of possible infection: redness, swelling, heat, red streaks, elevated temperature, and/or fever. These typically appear 4 to 6 days after the procedure. Immediately notify your doctor if you experience unusual bleeding, difficulty breathing, or loss of bowel or bladder control. If you experience increased pain, do not increase your pain medicine intake, unless instructed by your pain physician. Post-Procedure Care:  Be careful in moving about. Muscle spasms in the area of the injection may occur. Applying ice or heat to the area is often helpful. The incidence of spinal headaches after epidural injections ranges between 1.4% and 6%. If you develop a headache that does not seem to respond to conservative therapy, please let your physician know. This can be treated with an epidural blood patch.   Post-procedure numbness or redness is to be expected, however it should average 4 to 6 hours. If numbness and weakness of your extremities begins to develop 4 to 6 hours after your procedure, and is felt to be progressing and worsening, immediately contact your physician.   Diet:  If you experience nausea, do not eat until this sensation goes away. If you had a "Stellate Ganglion Block" for upper extremity "Reflex Sympathetic Dystrophy", do not eat or drink until your   hoarseness goes away. In any case, always start with liquids first and if you tolerate them well, then slowly progress to more solid foods. Activity:  For the first 4 to 6 hours after the procedure, use caution in moving about as you may experience numbness and/or weakness. Use caution in  cooking, using household electrical appliances, and climbing steps. If you need to reach your Doctor call our office: [phone] Monday-Thursday 8:00 am - 4:00 PM    Fridays: Closed     In case of an emergency: In case of emergency, call 911 or go to the nearest emergency room and have the physician there call us.  Interpretation of Procedure Every nerve block has two components: a diagnostic component, and a treatment component. Unrealistic expectations are the most common causes of "perceived failure".  In a perfect world, a single nerve block should be able to completely and permanently eliminate the pain. Sadly, the world is not perfect.  Most pain management nerve blocks are performed using local anesthetics and steroids. Steroids are responsible for any long-term benefit that you may experience. Their purpose is to decrease any chronic swelling that may exist in the area. Steroids begin to work immediately after being injected. However, most patients will not experience any benefits until 5 to 10 days after the injection, when the swelling has come down to the point where they can tell a difference. Steroids will only help if there is swelling to be treated. As such, they can assist with the diagnosis. If effective, they suggest an inflammatory component to the pain, and if ineffective, they rule out inflammation as the main cause or component of the problem. If the problem is one of mechanical compression, you will get no benefit from those steroids.   In the case of local anesthetics, they have a crucial role in the diagnosis of your condition. Most will begin to work within15 to 20 minutes after injection. The duration will depend on the type used (short- vs. Long-acting). It is of outmost importance that patients keep tract of their pain, after the procedure. To assist with this matter, a "Post-procedure Pain Diary" is provided. Make sure to complete it and to bring it back to your  follow-up appointment.  As long as the patient keeps accurate, detailed records of their symptoms after every procedure, and returns to have those interpreted, every procedure will provide us with invaluable information. Even a block that does not provide the patient with any relief, will always provide us with information about the mechanism and the origin of the pain. The only time a nerve block can be considered a waste of time is when patients do not keep track of the results, or do not keep their post-procedure appointment.  Reporting the results back to your physician The Pain Score  Pain is a subjective complaint. It cannot be seen, touched, or measured. We depend entirely on the patient's report of the pain in order to assess your condition and treatment. To evaluate the pain, we use a pain scale, where "0" means "No Pain", and a "10" is "the worst possible pain that you can even imagine" (i.e. something like been eaten alive by a shark or being torn apart by a lion).   You will frequently be asked to rate your pain. Please be as accurate, remember that medical decisions will be based on your responses. Please do not rate your pain above a 10. Doing so is actually interpreted as "symptom magnification" (exaggeration), as   well as lack of understanding with regards to the scale. To put this into perspective, when you tell us that your pain is at a 10 (ten), what you are saying is that there is nothing we can do to make this pain any worse. (Carefully think about that.) Epidural Steroid Injection An epidural steroid injection is given to relieve pain in your neck, back, or legs that is caused by the irritation or swelling of a nerve root. This procedure involves injecting a steroid and numbing medicine (anesthetic) into the epidural space. The epidural space is the space between the outer covering of your spinal cord and the bones that form your backbone (vertebra).  LET [facility] CARE PROVIDER  KNOW ABOUT:   Any allergies you have.  All medicines you are taking, including vitamins, herbs, eye drops, creams, and over-the-counter medicines such as aspirin.  Previous problems you or members of your family have had with the use of anesthetics.  Any blood disorders or blood clotting disorders you have.  Previous surgeries you have had.  Medical conditions you have.  RISKS AND COMPLICATIONS Generally, this is a safe procedure. However, as with any procedure, complications can occur. Possible complications of epidural steroid injection include:  Headache.  Bleeding.  Infection.  Allergic reaction to the medicines.  Damage to your nerves. The response to this procedure depends on the underlying cause of the pain and its duration. People who have long-term (chronic) pain are less likely to benefit from epidural steroids than are those people whose pain comes on strong and suddenly.  BEFORE THE PROCEDURE   Ask your health care provider about changing or stopping your regular medicines. You may be advised to stop taking blood-thinning medicines a few days before the procedure.  You may be given medicines to reduce anxiety.  Arrange for someone to take you home after the procedure.  PROCEDURE   You will remain awake during the procedure. You may receive medicine to make you relaxed.  You will be asked to lie on your stomach.  The injection site will be cleaned.  The injection site will be numbed with a medicine (local anesthetic).  A needle will be injected through your skin into the epidural space.  Your health care provider will use an X-ray machine to ensure that the steroid is delivered closest to the affected nerve. You may have minimal discomfort at this time.  Once the needle is in the right position, the local anesthetic and the steroid will be injected into the epidural space.  The needle will then be removed and a bandage will be applied to the injection  site.  AFTER THE PROCEDURE   You may be monitored for a short time before you go home.  You may feel weakness or numbness in your arm or leg, which disappears within hours.  You may be allowed to eat, drink, and take your regular medicine.  You may have soreness at the site of the injection.   This information is not intended to replace advice given to you by your health care provider. Make sure you discuss any questions you have with your health care provider.   Document Released: 11/08/2007 Document Revised: 04/03/2013 Document Reviewed: 01/18/2013 Elsevier Interactive Patient Education 2016 [employer]  GENERAL RISKS AND COMPLICATIONS  What are the risk, side effects and possible complications? Generally speaking, most procedures are safe.  However, with any procedure there are risks, side effects, and the possibility of complications.  The risks and complications  are dependent upon the sites that are lesioned, or the type of nerve block to be performed.  The closer the procedure is to the spine, the more serious the risks are.  Great care is taken when placing the radio frequency needles, block needles or lesioning probes, but sometimes complications can occur. Infection: Any time there is an injection through the skin, there is a risk of infection.  This is why sterile conditions are used for these blocks. There are four possible types of infection: 1. Localized skin infection. 2. Central Nervous System Infection: This can be in the form of Meningitis, which can be deadly. 3. Epidural Infections: This can be in the form of an epidural abscess, which can cause pressure inside of the spine, causing compression of the spinal cord with subsequent paralysis. This would require an emergency surgery to decompress, and there are no guarantees that the patient would recover from the paralysis. 4. Discitis: This is an infection of the intervertebral discs. It occurs in about 1% of discography  procedures. It is difficult to treat and it may lead to surgery. Pain: the needles have to go through skin and soft tissues, will cause soreness. Damage to internal structures:  The nerves to be lesioned may be near blood vessels or other nerves which can be potentially damaged. Bleeding: Bleeding is more common if the patient is taking blood thinners such as  aspirin, Coumadin, Ticiid, Plavix, etc., or if he/she have some genetic predisposition such as hemophilia. Bleeding into the spinal canal can cause compression of the spinal  cord with subsequent paralysis.  This would require an emergency surgery to decompress and there are no guarantees that the patient would recover from the paralysis. Pneumothorax: Puncturing of a lung is a possibility, every time a needle is introduced in the area of the chest or upper back.  Pneumothorax refers to free air around the collapsed lung(s), inside of the thoracic cavity (chest cavity).  Another two possible complications related to a similar event would include: Hemothorax and Chylothorax. These are variations of the Pneumothorax, where instead of air around the collapsed lung(s), you may have blood or chyle, respectively. Spinal headaches: They may occur with any procedures in the area of the spine. Persistent CSF (Cerebro-Spinal Fluid) leakage: This is a rare problem, but may occur with prolonged intrathecal or epidural catheters either due to the formation of a fistulous track or a dural tear. Nerve damage: By working so close to the spinal cord, there is always a possibility of nerve damage, which could be as serious as a permanent spinal cord injury with paralysis. Death: Although rare, severe deadly allergic reactions known as "Anaphylactic reaction" can occur to any of the medications used. Worsening of the symptoms: We can always make thing worse.  What are the chances of something like this happening? Chances of any of this occuring are extremely low.  By  statistics, you have more of a chance of getting killed in a motor vehicle accident: while driving to the hospital than any of the above occurring .  Nevertheless, you should be aware that they are possibilities.  In general, it is similar to taking a shower.  Everybody knows that you can slip, hit your head and get killed.  Does that mean that you should not shower again?  Nevertheless always keep in mind that statistics do not mean anything if you happen to be on the wrong side of them.  Even if a procedure has a 1 (one)  in a 1,000,000 (million) chance of going wrong, it you happen to be that one..Also, keep in mind that by statistics, you have more of a chance of having something go wrong when taking medications.  Who should not have this procedure? If you are on a blood thinning medication (e.g. Coumadin, Plavix, see list of "Blood Thinners"), or if you have an active infection going on, you should not have the procedure.  If you are taking any blood thinners, please inform your physician.  Preparing for your procedure: 1. Do not eat or drink anything at least eight (8) hours prior to the procedure. 2. Bring a driver with you .  It cannot be a taxi. 3. Come accompanied by an adult that can drive you back, and that is strong enough to help you if your legs get weak or numb from the local anesthetic. 4. Take all of your medicines the morning of the procedure with just enough water to swallow them. 5. If you have diabetes, make sure that you are scheduled to have your procedure done first thing in the morning, whenever possible. 6. If you have diabetes, take only half of your insulin dose and notify our nurse that you have done so as soon as you arrive at the clinic. 7. If you are diabetic, but only take blood sugar pills (oral hypoglycemic), then do not take them on the morning of your procedure.  You may take them after you have had the procedure. 8. Do not take aspirin or any aspirin-containing  medications, at least eleven (11) days prior to the procedure.  They may prolong bleeding. 9. Wear loose fitting clothing that may be easy to take off and that you would not mind if it got stained with Betadine or blood. 10. Do not wear any jewelry or perfume 11. Remove any nail coloring.  It will interfere with some of our monitoring equipment. 12. If you take Metformin for your diabetes, stop it 48 hours prior to the procedure.  NOTE: Remember that this is not meant to be interpreted as a complete list of all possible complications.  Unforeseen problems may occur.  BLOOD THINNERS The following drugs contain aspirin or other products, which can cause increased bleeding during surgery and should not be taken for 2 weeks prior to and 1 week after surgery.  If you should need take something for relief of minor pain, you may take acetaminophen which is found in Tylenol,m Datril, Anacin-3 and Panadol. It is not blood thinner. The products listed below are.  Do not take any of the products listed below in addition to any listed on your instruction sheet.  A.P.C or A.P.C with Codeine Codeine Phosphate Capsules #3 Ibuprofen Ridaura  ABC compound Congesprin Imuran rimadil  Advil Cope Indocin Robaxisal  Alka-Seltzer Effervescent Pain Reliever and Antacid Coricidin or Coricidin-D  Indomethacin Rufen  Alka-Seltzer plus Cold Medicine Cosprin Ketoprofen S-A-C Tablets  Anacin Analgesic Tablets or Capsules Coumadin Korlgesic Salflex  Anacin Extra Strength Analgesic tablets or capsules CP-2 Tablets Lanoril Salicylate  Anaprox Cuprimine Capsules Levenox Salocol  Anexsia-D Dalteparin Magan Salsalate  Anodynos Darvon compound Magnesium Salicylate Sine-off  Ansaid Dasin Capsules Magsal Sodium Salicylate  Anturane Depen Capsules Marnal Soma  APF Arthritis pain formula Dewitt's Pills Measurin Stanback  Argesic Dia-Gesic Meclofenamic Sulfinpyrazone  Arthritis Bayer Timed Release Aspirin Diclofenac Meclomen  Sulindac  Arthritis pain formula Anacin Dicumarol Medipren Supac  Analgesic (Safety coated) Arthralgen Diffunasal Mefanamic Suprofen  Arthritis Strength Bufferin Dihydrocodeine Mepro Compound Suprol  Arthropan liquid Dopirydamole Methcarbomol with Aspirin Synalgos  ASA tablets/Enseals Disalcid Micrainin Tagament  Ascriptin Doan's Midol Talwin  Ascriptin A/D Dolene Mobidin Tanderil  Ascriptin Extra Strength Dolobid Moblgesic Ticlid  Ascriptin with Codeine Doloprin or Doloprin with Codeine Momentum Tolectin  Asperbuf Duoprin Mono-gesic Trendar  Aspergum Duradyne Motrin or Motrin IB Triminicin  Aspirin plain, buffered or enteric coated Durasal Myochrisine Trigesic  Aspirin Suppositories Easprin Nalfon Trillsate  Aspirin with Codeine Ecotrin Regular or Extra Strength Naprosyn Uracel  Atromid-S Efficin Naproxen Ursinus  Auranofin Capsules Elmiron Neocylate Vanquish  Axotal Emagrin Norgesic Verin  Azathioprine Empirin or Empirin with Codeine Normiflo Vitamin E  Azolid Emprazil Nuprin Voltaren  Bayer Aspirin plain, buffered or children's or timed BC Tablets or powders Encaprin Orgaran Warfarin Sodium  Buff-a-Comp Enoxaparin Orudis Zorpin  Buff-a-Comp with Codeine Equegesic Os-Cal-Gesic   Buffaprin Excedrin plain, buffered or Extra Strength Oxalid   Bufferin Arthritis Strength Feldene Oxphenbutazone   Bufferin plain or Extra Strength Feldene Capsules Oxycodone with Aspirin   Bufferin with Codeine Fenoprofen Fenoprofen Pabalate or Pabalate-SF   Buffets II Flogesic Panagesic   Buffinol plain or Extra Strength Florinal or Florinal with Codeine Panwarfarin   Buf-Tabs Flurbiprofen Penicillamine   Butalbital Compound Four-way cold tablets Penicillin   Butazolidin Fragmin Pepto-Bismol   Carbenicillin Geminisyn Percodan   Carna Arthritis Reliever Geopen Persantine   Carprofen Gold's salt Persistin   Chloramphenicol Goody's Phenylbutazone   Chloromycetin Haltrain Piroxlcam   Clmetidine heparin  Plaquenil   Cllnoril Hyco-pap Ponstel   Clofibrate Hydroxy chloroquine Propoxyphen         Before stopping any of these medications, be sure to consult the physician who ordered them.  Some, such as Coumadin (Warfarin) are ordered to prevent or treat serious conditions such as "deep thrombosis", "pumonary embolisms", and other heart problems.  The amount of time that you may need off of the medication may also vary with the medication and the reason for which you were taking it.  If you are taking any of these medications, please make sure you notify your pain physician before you undergo any procedures.

## 2017-05-17 NOTE — Progress Notes (Signed)
Safety precautions to be maintained throughout the outpatient stay will include: orient to surroundings, keep bed in low position, maintain call bell within reach at all times, provide assistance with transfer out of bed and ambulation.  

## 2017-05-18 ENCOUNTER — Telehealth: Payer: Self-pay | Admitting: *Deleted

## 2017-05-18 NOTE — Telephone Encounter (Signed)
Attempted to call for post procedure follow-up. Message left. 

## 2017-05-31 ENCOUNTER — Ambulatory Visit: Payer: Medicare Other | Admitting: Student in an Organized Health Care Education/Training Program

## 2017-05-31 ENCOUNTER — Ambulatory Visit
Payer: Medicare Other | Attending: Student in an Organized Health Care Education/Training Program | Admitting: Student in an Organized Health Care Education/Training Program

## 2017-05-31 ENCOUNTER — Encounter: Payer: Self-pay | Admitting: Student in an Organized Health Care Education/Training Program

## 2017-05-31 VITALS — BP 144/59 | HR 74 | Temp 98.2°F | Resp 18 | Ht 63.0 in | Wt 170.0 lb

## 2017-05-31 DIAGNOSIS — M5136 Other intervertebral disc degeneration, lumbar region: Secondary | ICD-10-CM

## 2017-05-31 DIAGNOSIS — Z4889 Encounter for other specified surgical aftercare: Secondary | ICD-10-CM | POA: Diagnosis present

## 2017-05-31 DIAGNOSIS — M170 Bilateral primary osteoarthritis of knee: Secondary | ICD-10-CM | POA: Diagnosis not present

## 2017-05-31 DIAGNOSIS — M5116 Intervertebral disc disorders with radiculopathy, lumbar region: Secondary | ICD-10-CM | POA: Diagnosis not present

## 2017-05-31 DIAGNOSIS — M545 Low back pain: Secondary | ICD-10-CM | POA: Insufficient documentation

## 2017-05-31 DIAGNOSIS — M9983 Other biomechanical lesions of lumbar region: Secondary | ICD-10-CM | POA: Diagnosis not present

## 2017-05-31 DIAGNOSIS — I10 Essential (primary) hypertension: Secondary | ICD-10-CM | POA: Diagnosis not present

## 2017-05-31 DIAGNOSIS — Z9889 Other specified postprocedural states: Secondary | ICD-10-CM | POA: Insufficient documentation

## 2017-05-31 DIAGNOSIS — G894 Chronic pain syndrome: Secondary | ICD-10-CM

## 2017-05-31 DIAGNOSIS — M5416 Radiculopathy, lumbar region: Secondary | ICD-10-CM | POA: Diagnosis not present

## 2017-05-31 DIAGNOSIS — M48061 Spinal stenosis, lumbar region without neurogenic claudication: Secondary | ICD-10-CM | POA: Insufficient documentation

## 2017-05-31 DIAGNOSIS — Z880 Allergy status to penicillin: Secondary | ICD-10-CM | POA: Insufficient documentation

## 2017-05-31 DIAGNOSIS — Z79899 Other long term (current) drug therapy: Secondary | ICD-10-CM | POA: Diagnosis not present

## 2017-05-31 NOTE — Progress Notes (Signed)
Patient's Name: Meagan Wilcox  MRN: [id number]  Referring Provider: Cletis Athens, MD  DOB: September 08, 1930  PCP: Cletis Athens, MD  DOS: 05/31/2017  Note by: Gillis Santa, MD  Service setting: Ambulatory outpatient  Specialty: Interventional Pain Management  Location: [facility] (AMB) Pain Management Facility    Patient type: Established   Primary Reason(s) for Visit: Encounter for post-procedure evaluation of chronic illness with mild to moderate exacerbation CC: Back Pain (low and radiates to left leg)  HPI  Meagan Wilcox is a 83 y.o. year old, female patient, who comes today for a post-procedure evaluation. She has Hypertension and Atypical ductal hyperplasia of left breast on her problem list. Her primarily concern today is the Back Pain (low and radiates to left leg)  Pain Assessment: Location: Lower, Left Back Radiating: left leg Onset: More than a month ago Duration: Chronic pain Quality:  (denies pain today) Severity: 0-No pain/10 (self-reported pain score)  Note: Reported level is compatible with observation.                   When using our objective Pain Scale, levels between 6 and 10/10 are said to belong in an emergency room, as it progressively worsens from a 6/10, described as severely limiting, requiring emergency care not usually available at an outpatient pain management facility. At a 6/10 level, communication becomes difficult and requires great effort. Assistance to reach the emergency department may be required. Facial flushing and profuse sweating along with potentially dangerous increases in heart rate and blood pressure will be evident. Effect on ADL:   Timing: Constant Modifying factors: procedures, medications  Meagan Wilcox comes in today for post-procedure evaluation after the treatment done on 05/17/2017.  Further details on both, my assessment(s), as well as the proposed treatment plan, please see below.  Post-Procedure Assessment  05/17/2017 Procedure: left L5-S1  ESI Pre-procedure pain score:  3/10 Post-procedure pain score: 3/10         Influential Factors: BMI: 30.11 kg/m Intra-procedural challenges: None observed.         Assessment challenges: None detected.              Reported side-effects: None.        Post-procedural adverse reactions or complications: None reported         Sedation: Please see nurses note. When no sedatives are used, the analgesic levels obtained are directly associated to the effectiveness of the local anesthetics. However, when sedation is provided, the level of analgesia obtained during the initial 1 hour following the intervention, is believed to be the result of a combination of factors. These factors may include, but are not limited to: 1. The effectiveness of the local anesthetics used. 2. The effects of the analgesic(s) and/or anxiolytic(s) used. 3. The degree of discomfort experienced by the patient at the time of the procedure. 4. The patients ability and reliability in recalling and recording the events. 5. The presence and influence of possible secondary gains and/or psychosocial factors. Reported result: Relief experienced during the 1st hour after the procedure: 50 % (Ultra-Short Term Relief)            Interpretative annotation: Clinically appropriate result. Analgesia during this period is likely to be Local Anesthetic and/or IV Sedative (Analgesic/Anxiolytic) related.          Effects of local anesthetic: The analgesic effects attained during this period are directly associated to the localized infiltration of local anesthetics and therefore cary significant diagnostic value as to the etiological  location, or anatomical origin, of the pain. Expected duration of relief is directly dependent on the pharmacodynamics of the local anesthetic used. Long-acting (4-6 hours) anesthetics used.  Reported result: Relief during the next 4 to 6 hour after the procedure: 50 % (Short-Term Relief)            Interpretative  annotation: Clinically appropriate result. Analgesia during this period is likely to be Local Anesthetic-related.          Long-term benefit: Defined as the period of time past the expected duration of local anesthetics (1 hour for short-acting and 4-6 hours for long-acting). With the possible exception of prolonged sympathetic blockade from the local anesthetics, benefits during this period are typically attributed to, or associated with, other factors such as analgesic sensory neuropraxia, antiinflammatory effects, or beneficial biochemical changes provided by agents other than the local anesthetics.  Reported result: Extended relief following procedure: 100 % (Long-Term Relief)            Interpretative annotation: Clinically appropriate result. Good relief. No permanent benefit expected. Inflammation plays a part in the etiology to the pain.          Current benefits: Defined as reported results that persistent at this point in time.   Analgesia: 100 %            Function: Somewhat improved ROM: Somewhat improved Interpretative annotation: Recurrence of symptoms. Therapeutic success. Effective therapeutic approach.          Interpretation: Results would suggest a successful diagnostic and therapeutic intervention.                  Plan:  Please see "Plan of Care" for details.       Laboratory Chemistry  Inflammation Markers (CRP: Acute Phase) (ESR: Chronic Phase) No results found for: CRP, ESRSEDRATE               Renal Function Markers No results found for: BUN, CREATININE, GFRAA, GFRNONAA               Hepatic Function Markers No results found for: AST, ALT, ALBUMIN, ALKPHOS, HCVAB               Electrolytes No results found for: NA, K, CL, CALCIUM, MG               Neuropathy Markers No results found for: [id number]               Bone Pathology Markers No results found for: Hendricks Milo, VD125OH2TOT, [id number], GB1517OH6, 25OHVITD1, 25OHVITD2, 25OHVITD3, CALCIUM,  TESTOFREE, TESTOSTERONE               Coagulation Parameters No results found for: INR, LABPROT, APTT, PLT               Cardiovascular Markers No results found for: BNP, HGB, HCT               Note: Lab results reviewed.  Recent Diagnostic Imaging Results  DG C-Arm 1-60 Min-No Report Fluoroscopy was utilized by the requesting physician.  No radiographic  interpretation.   Complexity Note: Imaging results reviewed. Results shared with Meagan Wilcox, using [employer].                         Meds   Current Outpatient Prescriptions:  .  amitriptyline (ELAVIL) 25 MG tablet, Take 25 mg by mouth at bedtime., Disp: , Rfl:  .  aspirin 81 MG  chewable tablet, Chew 81 mg by mouth daily., Disp: , Rfl:  .  Biotin (BIOTIN 5000) 5 MG CAPS, Take by mouth daily., Disp: , Rfl:  .  Cholecalciferol (VITAMIN D3) 1000 units CAPS, Take by mouth., Disp: , Rfl:  .  gabapentin (NEURONTIN) 100 MG capsule, Take 100 mg by mouth 2 (two) times daily., Disp: , Rfl:  .  levothyroxine (SYNTHROID, LEVOTHROID) 100 MCG tablet, Take 100 mcg by mouth daily., Disp: , Rfl:  .  metaxalone (SKELAXIN) 800 MG tablet, Take 800 mg by mouth as needed for muscle spasms., Disp: , Rfl:  .  omeprazole (PRILOSEC) 20 MG capsule, Take 1 capsule by mouth daily., Disp: , Rfl:  .  pentosan polysulfate (ELMIRON) 100 MG capsule, Take 100 mg by mouth 2 (two) times daily. , Disp: , Rfl:  .  simvastatin (ZOCOR) 20 MG tablet, Take 20 mg by mouth every evening., Disp: , Rfl:   ROS  Constitutional: Denies any fever or chills Gastrointestinal: No reported hemesis, hematochezia, vomiting, or acute GI distress Musculoskeletal: Denies any acute onset joint swelling, redness, loss of ROM, or weakness Neurological: No reported episodes of acute onset apraxia, aphasia, dysarthria, agnosia, amnesia, paralysis, loss of coordination, or loss of consciousness  Allergies  Meagan Wilcox is allergic to amoxicillin and penicillins.  [place]  Drug: Meagan Wilcox   reports that she does not use drugs. Alcohol:  reports that she does not drink alcohol. Tobacco:  reports that she has never smoked. She has never used smokeless tobacco. Medical:  has a past medical history of Abnormal mammogram, unspecified (2011); Arthritis (2002); Heart murmur (1985); and Hypertension (2005). Surgical: Meagan Wilcox  has a past surgical history that includes Appendectomy (1939); Colonoscopy (2010); cataract surgery  (Bilateral, 2009); left breast biopsy  (2009); Breast surgery (Left, 2013); Breast surgery (Left, May 2010); Breast biopsy (Left, 02-24-14); Breast biopsy (Left, 02/11/2016); Breast excisional biopsy (Left, 2014); and Breast biopsy (Left, 2013). Family: family history is not on file.  Constitutional Exam  General appearance: alert, cooperative and in no distress Vitals:   05/31/17 1342  BP: (!) 144/59  Pulse: 74  Resp: 18  Temp: 98.2 F (36.8 C)  TempSrc: Oral  SpO2: 98%  Weight: 170 lb (77.1 kg)  Height: '5\' 3"'  (1.6 m)   BMI Assessment: Estimated body mass index is 30.11 kg/m as calculated from the following:   Height as of this encounter: '5\' 3"'  (1.6 m).   Weight as of this encounter: 170 lb (77.1 kg).  BMI interpretation table: BMI level Category Range association with higher incidence of chronic pain  <18 kg/m2 Underweight   18.5-24.9 kg/m2 Ideal body weight   25-29.9 kg/m2 Overweight Increased incidence by 20%  30-34.9 kg/m2 Obese (Class I) Increased incidence by 68%  35-39.9 kg/m2 Severe obesity (Class II) Increased incidence by 136%  >40 kg/m2 Extreme obesity (Class III) Increased incidence by 254%   BMI Readings from Last 4 Encounters:  05/31/17 30.11 kg/m  05/17/17 30.11 kg/m  08/30/16 29.35 kg/m  08/06/15 29.35 kg/m   Wt Readings from Last 4 Encounters:  05/31/17 170 lb (77.1 kg)  05/17/17 170 lb (77.1 kg)  08/30/16 171 lb (77.6 kg)  08/06/15 171 lb (77.6 kg)  Psych/Mental status: Alert, oriented x 3 (person, place, & time)        Eyes: PERLA Respiratory: No evidence of acute respiratory distress  Cervical Spine Area Exam  Skin & Axial Inspection: No masses, redness, edema, swelling, or associated skin lesions Alignment: Symmetrical Functional  ROM: Unrestricted ROM      Stability: No instability detected Muscle Tone/Strength: Functionally intact. No obvious neuro-muscular anomalies detected. Sensory (Neurological): Unimpaired Palpation: No palpable anomalies              Upper Extremity (UE) Exam    Side: Right upper extremity  Side: Left upper extremity  Skin & Extremity Inspection: Skin color, temperature, and hair growth are WNL. No peripheral edema or cyanosis. No masses, redness, swelling, asymmetry, or associated skin lesions. No contractures.  Skin & Extremity Inspection: Skin color, temperature, and hair growth are WNL. No peripheral edema or cyanosis. No masses, redness, swelling, asymmetry, or associated skin lesions. No contractures.  Functional ROM: Unrestricted ROM          Functional ROM: Unrestricted ROM          Muscle Tone/Strength: Functionally intact. No obvious neuro-muscular anomalies detected.  Muscle Tone/Strength: Functionally intact. No obvious neuro-muscular anomalies detected.  Sensory (Neurological): Unimpaired          Sensory (Neurological): Unimpaired          Palpation: No palpable anomalies              Palpation: No palpable anomalies              Specialized Test(s): Deferred         Specialized Test(s): Deferred          Thoracic Spine Area Exam  Skin & Axial Inspection: No masses, redness, or swelling Alignment: Symmetrical Functional ROM: Unrestricted ROM Stability: No instability detected Muscle Tone/Strength: Functionally intact. No obvious neuro-muscular anomalies detected. Sensory (Neurological): Unimpaired Muscle strength & Tone: No palpable anomalies  Lumbar Spine Area Exam  Skin & Axial Inspection: No masses, redness, or swelling Alignment: Symmetrical Functional  ROM: Unrestricted ROM      Stability: No instability detected Muscle Tone/Strength: Functionally intact. No obvious neuro-muscular anomalies detected. Sensory (Neurological): Unimpaired Palpation: No palpable anomalies       Provocative Tests: Lumbar Hyperextension and rotation test: Improved after treatment Lumbar Lateral bending test: Improved after treatment       Patrick's Maneuver: evaluation deferred today                    Gait & Posture Assessment  Ambulation: Patient ambulates using a cane Gait: Antalgic Posture: Difficulty standing up straight, due to pain   Lower Extremity Exam    Side: Right lower extremity  Side: Left lower extremity  Skin & Extremity Inspection: Skin color, temperature, and hair growth are WNL. No peripheral edema or cyanosis. No masses, redness, swelling, asymmetry, or associated skin lesions. No contractures.  Skin & Extremity Inspection: Skin color, temperature, and hair growth are WNL. No peripheral edema or cyanosis. No masses, redness, swelling, asymmetry, or associated skin lesions. No contractures.  Functional ROM: Unrestricted ROM          Functional ROM: Unrestricted ROM          Muscle Tone/Strength: Functionally intact. No obvious neuro-muscular anomalies detected.  Muscle Tone/Strength: Functionally intact. No obvious neuro-muscular anomalies detected.  Sensory (Neurological): Unimpaired  Sensory (Neurological): Unimpaired  Palpation: Tender  Palpation: Tender  Tender to palpation of bilateral patellas.No joint effusion noted.  Assessment  Primary Diagnosis & Pertinent Problem List: The primary encounter diagnosis was Primary osteoarthritis of both knees. Diagnoses of Lumbar degenerative disc disease, Lumbar radiculopathy, Neuroforaminal stenosis of lumbar spine, and Chronic pain syndrome were also pertinent to this visit.  Status Diagnosis  Having a  Flare-up Improved Stable 1. Primary osteoarthritis of both knees   2. Lumbar degenerative  disc disease   3. Lumbar radiculopathy   4. Neuroforaminal stenosis of lumbar spine   5. Chronic pain syndrome      83 year old female who presents with axial low back and left leg pain that is improved since her left L5-S1 epidural steroid injection done 2 weeks ago. She states that her pain scores have ranged between 0-1. She is able to sleep without waking up as often. She has also endorsed improvement in her ambulation. She is very pleased with the results from her lumbar epidural steroid injection.  Today patient is endorsing bilateral knee pain. Patient has severe osteoarthritis of bilateral knees. She previously has had intra-articular steroid injections over 5 years ago which were somewhat effective. She is interested in repeating this therapy. I discussed performing intra-articular Hyalgan injections bilaterally which the patient and her family members are in agreement with. I also discussed genicular nerve blocks bilaterally if the Hyalgan injections are not effective.  I will also provide a referral to physical therapy and recommend the patient start with aquatic therapy first and then transition to land-based therapy. Patient may benefit from McKenzie exercises focusing on spine rehabilitation.  Patient will also complete urine drug scan today and signed opioid agreement.  Plan: -significant benefit from left L5-S1 ESI, can repeat in the future -plan for bilateral intra-articular Hyalgan knee injections, series of 5 -UDS today. Patient will sign an opioid agreement.  Plan of Care  Pharmacotherapy (Medications Ordered): No orders of the defined types were placed in this encounter.  Lab-work, procedure(s), and/or referral(s): Orders Placed This Encounter  Procedures  . KNEE INJECTION  . Compliance Drug Analysis, Ur  . Ambulatory referral to Physical Therapy    Pharmacological management options:  Patient currently on amitriptyline 25 mg daily at bedtime, gabapentin 100 mg  twice a day, Synthroid 100 g,Skelaxin 800 mg when necessary.  Interventional management options: Planned, scheduled, and/or pending:    -Bilateral intra-articular Hyalgan injections   Considering:   -repeat lumbar epidural steroid injection, left L5-S1 (previous one done October 3 with greater than 75% benefit) -Genicular nerve blocks   PRN Procedures:   To be determined at a later time   Provider-requested follow-up: Return in about 1 week (around 06/07/2017) for Procedure.  Future Appointments Date Time Provider [place]  06/07/2017 2:00 PM Gillis Santa, MD [facility] None    Primary Care Physician: Cletis Athens, MD Location: [facility] Note by: Gillis Santa, M.D Date: 05/31/2017; Time: 2:50 PM  Patient Instructions  1. Urine drug screen today which is standard for new patients. 2. Referral to physical therapy. Recommend starting with aquatic therapy first. 3. Bilateral intra-articular Hyalgan injections. 4. Sign opioid contract. 5. Please follow up next week for knee injections.

## 2017-05-31 NOTE — Progress Notes (Signed)
Safety precautions to be maintained throughout the outpatient stay will include: orient to surroundings, keep bed in low position, maintain call bell within reach at all times, provide assistance with transfer out of bed and ambulation.  

## 2017-05-31 NOTE — Patient Instructions (Signed)
1. Urine drug screen today which is standard for new patients. 2. Referral to physical therapy. Recommend starting with aquatic therapy first. 3. Bilateral intra-articular Hyalgan injections. 4. Sign opioid contract. 5. Please follow up next week for knee injections.

## 2017-06-06 ENCOUNTER — Other Ambulatory Visit: Payer: Self-pay

## 2017-06-06 DIAGNOSIS — Z1231 Encounter for screening mammogram for malignant neoplasm of breast: Secondary | ICD-10-CM

## 2017-06-07 ENCOUNTER — Encounter: Payer: Self-pay | Admitting: Student in an Organized Health Care Education/Training Program

## 2017-06-07 ENCOUNTER — Ambulatory Visit
Payer: Medicare Other | Attending: Student in an Organized Health Care Education/Training Program | Admitting: Student in an Organized Health Care Education/Training Program

## 2017-06-07 VITALS — BP 156/74 | HR 74 | Temp 98.3°F | Resp 18 | Ht 63.0 in | Wt 170.0 lb

## 2017-06-07 DIAGNOSIS — M25561 Pain in right knee: Secondary | ICD-10-CM | POA: Insufficient documentation

## 2017-06-07 DIAGNOSIS — M25562 Pain in left knee: Secondary | ICD-10-CM | POA: Diagnosis present

## 2017-06-07 DIAGNOSIS — Z79899 Other long term (current) drug therapy: Secondary | ICD-10-CM | POA: Insufficient documentation

## 2017-06-07 DIAGNOSIS — Z7982 Long term (current) use of aspirin: Secondary | ICD-10-CM | POA: Insufficient documentation

## 2017-06-07 DIAGNOSIS — Z880 Allergy status to penicillin: Secondary | ICD-10-CM | POA: Insufficient documentation

## 2017-06-07 DIAGNOSIS — M170 Bilateral primary osteoarthritis of knee: Secondary | ICD-10-CM | POA: Insufficient documentation

## 2017-06-07 DIAGNOSIS — G894 Chronic pain syndrome: Secondary | ICD-10-CM | POA: Diagnosis not present

## 2017-06-07 LAB — COMPLIANCE DRUG ANALYSIS, UR

## 2017-06-07 MED ORDER — SODIUM HYALURONATE (VISCOSUP) 20 MG/2ML IX SOSY
2.0000 mL | PREFILLED_SYRINGE | Freq: Once | INTRA_ARTICULAR | Status: DC
  Filled 2017-06-07: qty 2

## 2017-06-07 MED ORDER — LIDOCAINE HCL (PF) 1 % IJ SOLN
5.0000 mL | Freq: Once | INTRAMUSCULAR | Status: DC
  Filled 2017-06-07: qty 5

## 2017-06-07 NOTE — Progress Notes (Signed)
Patient's Name: Meagan Wilcox  MRN: [id number]  Referring Provider: Corky DownsMasoud, Javed, MD  DOB: 1931-05-06  PCP: Corky DownsMasoud, Javed, MD  DOS: 06/07/2017  Note by: Edward JollyBilal Goldye Tourangeau, MD  Service setting: Ambulatory outpatient  Specialty: Interventional Pain Management  Patient type: Established  Location: [facility] (AMB) Pain Management Facility  Visit type: Interventional Procedure   Primary Reason for Visit: Interventional Pain Management Treatment. CC: Knee Pain (bilateral knee pain, left is worse.)  Procedure:  Anesthesia, Analgesia, Anxiolysis:  Type: Diagnostic Intra-Articular Hyalgan Knee Injection Region: Lateral infrapatellar Knee Region Level: Knee Joint Laterality: Bilateral  Type: Local Anesthesia Local Anesthetic: Lidocaine 1% Route: Infiltration ([place]/IM) IV Access: Declined Sedation: Declined  Indication(s): Analgesia           Indications: 1. Primary osteoarthritis of both knees   2. Chronic pain syndrome    Pain Score: Pre-procedure: 1 /10 Post-procedure: 1 /10  Pre-op Assessment:  Meagan Wilcox is a 83 y.o. (year old), female patient, seen today for interventional treatment. She  has a past surgical history that includes Appendectomy (1939); Colonoscopy (2010); cataract surgery  (Bilateral, 2009); left breast biopsy  (2009); Breast surgery (Left, 2013); Breast surgery (Left, May 2010); Breast biopsy (Left, 02-24-14); Breast biopsy (Left, 02/11/2016); Breast excisional biopsy (Left, 2014); and Breast biopsy (Left, 2013). Meagan Wilcox has a current medication list which includes the following prescription(s): amitriptyline, aspirin, biotin, vitamin d3, gabapentin, levothyroxine, metaxalone, omeprazole, pentosan polysulfate, and simvastatin, and the following Facility-Administered Medications: lidocaine (pf), sodium hyaluronate, and sodium hyaluronate. Her primarily concern today is the Knee Pain (bilateral knee pain, left is worse.)  Initial Vital Signs: There were no vitals taken for this  visit. BMI: Estimated body mass index is 30.11 kg/m as calculated from the following:   Height as of this encounter: 5\' 3"  (1.6 m).   Weight as of this encounter: 170 lb (77.1 kg).  Risk Assessment: Allergies: Reviewed. She is allergic to amoxicillin and penicillins.  Allergy Precautions: None required Coagulopathies: Reviewed. None identified.  Blood-thinner therapy: None at this time Active Infection(s): Reviewed. None identified. Meagan Wilcox is afebrile  Site Confirmation: Meagan Wilcox was asked to confirm the procedure and laterality before marking the site Procedure checklist: Completed Consent: Before the procedure and under the influence of no sedative(s), amnesic(s), or anxiolytics, the patient was informed of the treatment options, risks and possible complications. To fulfill our ethical and legal obligations, as recommended by the American Medical Association's Code of Ethics, I have informed the patient of my clinical impression; the nature and purpose of the treatment or procedure; the risks, benefits, and possible complications of the intervention; the alternatives, including doing nothing; the risk(s) and benefit(s) of the alternative treatment(s) or procedure(s); and the risk(s) and benefit(s) of doing nothing. The patient was provided information about the general risks and possible complications associated with the procedure. These may include, but are not limited to: failure to achieve desired goals, infection, bleeding, organ or nerve damage, allergic reactions, paralysis, and death. In addition, the patient was informed of those risks and complications associated to the procedure, such as failure to decrease pain; infection; bleeding; organ or nerve damage with subsequent damage to sensory, motor, and/or autonomic systems, resulting in permanent pain, numbness, and/or weakness of one or several areas of the body; allergic reactions; (i.e.: anaphylactic reaction); and/or  death. Furthermore, the patient was informed of those risks and complications associated with the medications. These include, but are not limited to: allergic reactions (i.e.: anaphylactic or anaphylactoid reaction(s)); adrenal axis suppression;  blood sugar elevation that in diabetics may result in ketoacidosis or comma; water retention that in patients with history of congestive heart failure may result in shortness of breath, pulmonary edema, and decompensation with resultant heart failure; weight gain; swelling or edema; medication-induced neural toxicity; particulate matter embolism and blood vessel occlusion with resultant organ, and/or nervous system infarction; and/or aseptic necrosis of one or more joints. Finally, the patient was informed that Medicine is not an exact science; therefore, there is also the possibility of unforeseen or unpredictable risks and/or possible complications that may result in a catastrophic outcome. The patient indicated having understood very clearly. We have given the patient no guarantees and we have made no promises. Enough time was given to the patient to ask questions, all of which were answered to the patient's satisfaction. Meagan Wilcox has indicated that she wanted to continue with the procedure. Attestation: I, the ordering provider, attest that I have discussed with the patient the benefits, risks, side-effects, alternatives, likelihood of achieving goals, and potential problems during recovery for the procedure that I have provided informed consent. Date: 06/07/2017; Time: 1:34 PM  Pre-Procedure Preparation:  Monitoring: As per clinic protocol. Respiration, ETCO2, SpO2, BP, heart rate and rhythm monitor placed and checked for adequate function Safety Precautions: Patient was assessed for positional comfort and pressure points before starting the procedure. Time-out: I initiated and conducted the "Time-out" before starting the procedure, as per protocol. The  patient was asked to participate by confirming the accuracy of the "Time Out" information. Verification of the correct person, site, and procedure were performed and confirmed by me, the nursing staff, and the patient. "Time-out" conducted as per Joint Commission's Universal Protocol (UP.01.01.01). "Time-out" Date & Time: 06/07/2017; 1417 hrs.  Description of Procedure Process:   Position: Sitting Target Area: Knee Joint Approach: Just above the Lateral tibial plateau, lateral to the infrapatellar tendon. Area Prepped: Entire knee area, from the mid-thigh to the mid-shin. Prepping solution: ChloraPrep (2% chlorhexidine gluconate and 70% isopropyl alcohol) Safety Precautions: Aspiration looking for blood return was conducted prior to all injections. At no point did we inject any substances, as a needle was being advanced. No attempts were made at seeking any paresthesias. Safe injection practices and needle disposal techniques used. Medications properly checked for expiration dates. SDV (single dose vial) medications used. Description of the Procedure: Protocol guidelines were followed. The patient was placed in position over the fluoroscopy table. The target area was identified and the area prepped in the usual manner. Skin desensitized using vapocoolant spray. Skin & deeper tissues infiltrated with local anesthetic. Appropriate amount of time allowed to pass for local anesthetics to take effect. The procedure needles were then advanced to the target area. Proper needle placement secured. Negative aspiration confirmed. Solution injected in intermittent fashion, asking for systemic symptoms every 0.5cc of injectate. The needles were then removed and the area cleansed, making sure to leave some of the prepping solution back to take advantage of its long term bactericidal properties. Vitals:   06/07/17 1402 06/07/17 1425  BP: (!) 149/59 (!) 156/74  Pulse: 73 74  Resp: 16 18  Temp: 98.3 F (36.8 C)    TempSrc: Oral   SpO2: 98% 99%  Weight: 170 lb (77.1 kg)   Height: 5\' 3"  (1.6 m)     Start Time: 1417 hrs. End Time: 1422 hrs. Materials:  Needle(s) Type: Regular needle Gauge: 22G Length: 3.5-in Medication(s): Meagan Wilcox had no medications administered during this visit. Please see chart orders for  dosing details. 2 cc of Hyalgan injected into each knee Imaging Guidance:  Type of Imaging Technique: None used Indication(s): N/A Exposure Time: No patient exposure Contrast: None used. Fluoroscopic Guidance: N/A Ultrasound Guidance: N/A Interpretation: N/A  Antibiotic Prophylaxis:  Indication(s): None identified Antibiotic given: None  Post-operative Assessment:  EBL: None Complications: No immediate post-treatment complications observed by team, or reported by patient. Note: The patient tolerated the entire procedure well. A repeat set of vitals were taken after the procedure and the patient was kept under observation following institutional policy, for this type of procedure. Post-procedural neurological assessment was performed, showing return to baseline, prior to discharge. The patient was provided with post-procedure discharge instructions, including a section on how to identify potential problems. Should any problems arise concerning this procedure, the patient was given instructions to immediately contact us, at any time, without hesitation. In any case, we plan to contact the patient by telephone for a follow-up status report regarding this interventional procedure. Comments:  No additional relevant information. 5 out of 5 strength bilateral lower extremity: Plantar flexion, dorsiflexion, knee flexion, knee extension.  Plan of Care  Follow up for bilateral hyalgan injection 2/5 in 2 weeks.   Imaging Orders  No imaging studies ordered today    Procedure Orders     KNEE INJECTION  Medications ordered for procedure: Meds ordered this encounter  Medications  .  lidocaine (PF) (XYLOCAINE) 1 % injection 5 mL  . Sodium Hyaluronate SOSY 2 mL  . Sodium Hyaluronate SOSY 2 mL   Medications administered: Meagan Wilcox had no medications administered during this visit.  See the medical record for exact dosing, route, and time of administration.  New Prescriptions   No medications on file   Disposition: Discharge home  Discharge Date & Time: 06/07/2017; 1430 hrs.   Physician-requested Follow-up: Return in about 2 weeks (around 06/21/2017) for Procedure. Future Appointments Date Time Provider Department Center  06/21/2017 11:00 AM Edward Jolly, MD [facility] None   Primary Care Physician: Corky Downs, MD Location: [facility] Out[facility] Management Facility Note by: Edward Jolly, MD Date: 06/07/2017; Time: 3:07 PM  Disclaimer:  Medicine is not an exact science. The only guarantee in medicine is that nothing is guaranteed. It is important to note that the decision to proceed with this intervention was based on the information collected from the patient. The Data and conclusions were drawn from the patient's questionnaire, the interview, and the physical examination. Because the information was provided in large part by the patient, it cannot be guaranteed that it has not been purposely or unconsciously manipulated. Every effort has been made to obtain as much relevant data as possible for this evaluation. It is important to note that the conclusions that lead to this procedure are derived in large part from the available data. Always take into account that the treatment will also be dependent on availability of resources and existing treatment guidelines, considered by other Pain Management Practitioners as being common knowledge and practice, at the time of the intervention. For Medico-Legal purposes, it is also important to point out that variation in procedural techniques and pharmacological choices are the acceptable norm. The indications, contraindications,  technique, and results of the above procedure should only be interpreted and judged by a Board-Certified Interventional Pain Specialist with extensive familiarity and expertise in the same exact procedure and technique.

## 2017-06-07 NOTE — Patient Instructions (Addendum)
Follow up for Hyalgan injection 2/5 in 2 weeks   Pain Management Discharge Instructions  General Discharge Instructions :  If you need to reach your doctor call: Monday-Friday 8:00 am - 4:00 pm at [phone] or toll free [phone].  After clinic hours [phone] to have operator reach doctor.  Bring all of your medication bottles to all your appointments in the pain clinic.  To cancel or reschedule your appointment with Pain Management please remember to call 24 hours in advance to avoid a fee.  Refer to the educational materials which you have been given on: General Risks, I had my Procedure. Discharge Instructions, Post Sedation.  Post Procedure Instructions:  The drugs you were given will stay in your system until tomorrow, so for the next 24 hours you should not drive, make any legal decisions or drink any alcoholic beverages.  You may eat anything you prefer, but it is better to start with liquids then soups and crackers, and gradually work up to solid foods.  Please notify your doctor immediately if you have any unusual bleeding, trouble breathing or pain that is not related to your normal pain.  Depending on the type of procedure that was done, some parts of your body may feel week and/or numb.  This usually clears up by tonight or the next day.  Walk with the use of an assistive device or accompanied by an adult for the 24 hours.  You may use ice on the affected area for the first 24 hours.  Put ice in a Ziploc bag and cover with a towel and place against area 15 minutes on 15 minutes off.  You may switch to heat after 24 hours.

## 2017-06-07 NOTE — Progress Notes (Signed)
Safety precautions to be maintained throughout the outpatient stay will include: orient to surroundings, keep bed in low position, maintain call bell within reach at all times, provide assistance with transfer out of bed and ambulation.  

## 2017-06-21 ENCOUNTER — Encounter: Payer: Self-pay | Admitting: Student in an Organized Health Care Education/Training Program

## 2017-06-21 ENCOUNTER — Ambulatory Visit
Payer: Medicare Other | Attending: Student in an Organized Health Care Education/Training Program | Admitting: Student in an Organized Health Care Education/Training Program

## 2017-06-21 VITALS — BP 157/51 | HR 67 | Temp 98.1°F | Resp 18 | Ht 63.0 in | Wt 170.0 lb

## 2017-06-21 DIAGNOSIS — M25561 Pain in right knee: Secondary | ICD-10-CM | POA: Diagnosis present

## 2017-06-21 DIAGNOSIS — M25562 Pain in left knee: Secondary | ICD-10-CM | POA: Diagnosis present

## 2017-06-21 DIAGNOSIS — G894 Chronic pain syndrome: Secondary | ICD-10-CM | POA: Diagnosis not present

## 2017-06-21 DIAGNOSIS — M170 Bilateral primary osteoarthritis of knee: Secondary | ICD-10-CM | POA: Insufficient documentation

## 2017-06-21 MED ORDER — ROPIVACAINE HCL 2 MG/ML IJ SOLN
2.0000 mL | Freq: Once | INTRAMUSCULAR | Status: DC
  Filled 2017-06-21: qty 10

## 2017-06-21 MED ORDER — SODIUM HYALURONATE (VISCOSUP) 20 MG/2ML IX SOSY
2.0000 mL | PREFILLED_SYRINGE | Freq: Once | INTRA_ARTICULAR | Status: AC
  Administered 2017-06-21: 2 mL via INTRA_ARTICULAR
  Filled 2017-06-21: qty 2

## 2017-06-21 MED ORDER — LIDOCAINE HCL (PF) 1 % IJ SOLN
5.0000 mL | Freq: Once | INTRAMUSCULAR | Status: AC
  Administered 2017-06-21: 5 mL
  Filled 2017-06-21: qty 5

## 2017-06-21 MED ORDER — LIDOCAINE HCL (PF) 1 % IJ SOLN
INTRAMUSCULAR | Status: AC
  Filled 2017-06-21: qty 5

## 2017-06-21 NOTE — Progress Notes (Signed)
Patient's Name: Meagan Wilcox  MRN: [id number]  Referring Provider: Corky DownsMasoud, Javed, MD  DOB: 1931/07/24  PCP: Corky DownsMasoud, Javed, MD  DOS: 06/21/2017  Note by: Edward JollyBilal Samiha Denapoli, MD  Service setting: Ambulatory outpatient  Specialty: Interventional Pain Management  Patient type: Established  Location: [facility] (AMB) Pain Management Facility  Visit type: Interventional Procedure   Primary Reason for Visit: Interventional Pain Management Treatment. CC: Knee Pain (bilateral, left worse)  Procedure:  Anesthesia, Analgesia, Anxiolysis:  Type: Diagnostic Intra-Articular Hyalgan Knee Injection #2 Region: Medial infrapatellar Knee Region Level: Knee Joint Laterality: Bilateral  Type: Local Anesthesia Local Anesthetic: Lidocaine 1% Route: Infiltration (Kimball/IM) IV Access: Declined Sedation: Declined  Indication(s): Analgesia           Indications: 1. Primary osteoarthritis of both knees   2. Chronic pain syndrome    Pain Score: Pre-procedure: 1 /10 Post-procedure: 2 /10  Pre-op Assessment:  Meagan Wilcox is a 83 y.o. (year old), female patient, seen today for interventional treatment. She  has a past surgical history that includes Appendectomy (1939); Colonoscopy (2010); cataract surgery  (Bilateral, 2009); left breast biopsy  (2009); Breast surgery (Left, 2013); Breast surgery (Left, May 2010); Breast biopsy (Left, 02-24-14); Breast biopsy (Left, 02/11/2016); Breast excisional biopsy (Left, 2014); and Breast biopsy (Left, 2013). Meagan Wilcox has a current medication list which includes the following prescription(s): amitriptyline, aspirin, vitamin d3, diazepam, gabapentin, levothyroxine, losartan-hydrochlorothiazide, omeprazole, pentosan polysulfate, simvastatin, biotin, and metaxalone, and the following Facility-Administered Medications: ropivacaine (pf) 2 mg/ml (0.2%). Her primarily concern today is the Knee Pain (bilateral, left worse)  Initial Vital Signs: There were no vitals taken for this visit. BMI: Estimated  body mass index is 30.11 kg/m as calculated from the following:   Height as of this encounter: 5\' 3"  (1.6 m).   Weight as of this encounter: 170 lb (77.1 kg).  Risk Assessment: Allergies: Reviewed. She is allergic to amoxicillin and penicillins.  Allergy Precautions: None required Coagulopathies: Reviewed. None identified.  Blood-thinner therapy: None at this time Active Infection(s): Reviewed. None identified. Meagan Wilcox is afebrile  Site Confirmation: Meagan Wilcox was asked to confirm the procedure and laterality before marking the site Procedure checklist: Completed Consent: Before the procedure and under the influence of no sedative(s), amnesic(s), or anxiolytics, the patient was informed of the treatment options, risks and possible complications. To fulfill our ethical and legal obligations, as recommended by the American Medical Association's Code of Ethics, I have informed the patient of my clinical impression; the nature and purpose of the treatment or procedure; the risks, benefits, and possible complications of the intervention; the alternatives, including doing nothing; the risk(s) and benefit(s) of the alternative treatment(s) or procedure(s); and the risk(s) and benefit(s) of doing nothing. The patient was provided information about the general risks and possible complications associated with the procedure. These may include, but are not limited to: failure to achieve desired goals, infection, bleeding, organ or nerve damage, allergic reactions, paralysis, and death. In addition, the patient was informed of those risks and complications associated to the procedure, such as failure to decrease pain; infection; bleeding; organ or nerve damage with subsequent damage to sensory, motor, and/or autonomic systems, resulting in permanent pain, numbness, and/or weakness of one or several areas of the body; allergic reactions; (i.e.: anaphylactic reaction); and/or death. Furthermore, the patient was  informed of those risks and complications associated with the medications. These include, but are not limited to: allergic reactions (i.e.: anaphylactic or anaphylactoid reaction(s)); adrenal axis suppression; blood sugar elevation that in  diabetics may result in ketoacidosis or comma; water retention that in patients with history of congestive heart failure may result in shortness of breath, pulmonary edema, and decompensation with resultant heart failure; weight gain; swelling or edema; medication-induced neural toxicity; particulate matter embolism and blood vessel occlusion with resultant organ, and/or nervous system infarction; and/or aseptic necrosis of one or more joints. Finally, the patient was informed that Medicine is not an exact science; therefore, there is also the possibility of unforeseen or unpredictable risks and/or possible complications that may result in a catastrophic outcome. The patient indicated having understood very clearly. We have given the patient no guarantees and we have made no promises. Enough time was given to the patient to ask questions, all of which were answered to the patient's satisfaction. Meagan Wilcox has indicated that she wanted to continue with the procedure. Attestation: I, the ordering provider, attest that I have discussed with the patient the benefits, risks, side-effects, alternatives, likelihood of achieving goals, and potential problems during recovery for the procedure that I have provided informed consent. Date: 06/21/2017; Time: 1:34 PM  Pre-Procedure Preparation:  Monitoring: As per clinic protocol. Respiration, ETCO2, SpO2, BP, heart rate and rhythm monitor placed and checked for adequate function Safety Precautions: Patient was assessed for positional comfort and pressure points before starting the procedure. Time-out: I initiated and conducted the "Time-out" before starting the procedure, as per protocol. The patient was asked to participate by  confirming the accuracy of the "Time Out" information. Verification of the correct person, site, and procedure were performed and confirmed by me, the nursing staff, and the patient. "Time-out" conducted as per Joint Commission's Universal Protocol (UP.01.01.01). "Time-out" Date & Time: 06/21/2017; 1146 hrs.  Description of Procedure Process:   Position: Sitting Target Area: Knee Joint Approach: Just above the Medial tibial plateau, medial to the infrapatellar tendon. Area Prepped: Entire knee area, from the mid-thigh to the mid-shin. Prepping solution: ChloraPrep (2% chlorhexidine gluconate and 70% isopropyl alcohol) Safety Precautions: Aspiration looking for blood return was conducted prior to all injections. At no point did we inject any substances, as a needle was being advanced. No attempts were made at seeking any paresthesias. Safe injection practices and needle disposal techniques used. Medications properly checked for expiration dates. SDV (single dose vial) medications used. Description of the Procedure: Protocol guidelines were followed. The patient was placed in position over the fluoroscopy table. The target area was identified and the area prepped in the usual manner. Skin desensitized using vapocoolant spray. Skin & deeper tissues infiltrated with local anesthetic. Appropriate amount of time allowed to pass for local anesthetics to take effect. The procedure needles were then advanced to the target area. Proper needle placement secured. Negative aspiration confirmed. Solution injected in intermittent fashion, asking for systemic symptoms every 0.5cc of injectate. The needles were then removed and the area cleansed, making sure to leave some of the prepping solution back to take advantage of its long term bactericidal properties. Vitals:   06/21/17 1125 06/21/17 1157  BP: (!) 164/64 (!) 157/51  Pulse: 60 67  Resp: 18 18  Temp: 98.1 F (36.7 C)   SpO2: 100% 100%  Weight: 170 lb (77.1  kg)   Height: 5\' 3"  (1.6 m)     Start Time: 1148 hrs. End Time: 1153 hrs. Materials:  Needle(s) Type: Regular needle Gauge: 25G Length: 1.5-in Medication(s): We administered lidocaine (PF), Sodium Hyaluronate, and Sodium Hyaluronate. Please see chart orders for dosing details. 2 cc of Hyalgan injected into each  knee Imaging Guidance:  Type of Imaging Technique: None used Indication(s): N/A Exposure Time: No patient exposure Contrast: None used. Fluoroscopic Guidance: N/A Ultrasound Guidance: N/A Interpretation: N/A  Antibiotic Prophylaxis:  Indication(s): None identified Antibiotic given: None  Post-operative Assessment:  EBL: None Complications: No immediate post-treatment complications observed by team, or reported by patient. Note: The patient tolerated the entire procedure well. A repeat set of vitals were taken after the procedure and the patient was kept under observation following institutional policy, for this type of procedure. Post-procedural neurological assessment was performed, showing return to baseline, prior to discharge. The patient was provided with post-procedure discharge instructions, including a section on how to identify potential problems. Should any problems arise concerning this procedure, the patient was given instructions to immediately contact us, at any time, without hesitation. In any case, we plan to contact the patient by telephone for a follow-up status report regarding this interventional procedure. Comments:  No additional relevant information. 5 out of 5 strength bilateral lower extremity: Plantar flexion, dorsiflexion, knee flexion, knee extension.  Plan of Care  Follow up for bilateral hyalgan injection 3/5 in 3 weeks.   Imaging Orders  No imaging studies ordered today    Procedure Orders     KNEE INJECTION  Medications ordered for procedure: Meds ordered this encounter  Medications  . lidocaine (PF) (XYLOCAINE) 1 % injection 5 mL   . Sodium Hyaluronate SOSY 2 mL  . Sodium Hyaluronate SOSY 2 mL  . ropivacaine (PF) 2 mg/mL (0.2%) (NAROPIN) injection 2 mL   Medications administered: We administered lidocaine (PF), Sodium Hyaluronate, and Sodium Hyaluronate.  See the medical record for exact dosing, route, and time of administration.  This SmartLink is deprecated. Use AVSMEDLIST instead to display the medication list for a patient. Disposition: Discharge home  Discharge Date & Time: 06/21/2017;   hrs.   Physician-requested Follow-up: Return in about 3 weeks (around 07/12/2017). Future Appointments  Date Time Provider Department Center  07/11/2017 10:00 AM Edward Jolly, MD [facility] None  08/02/2017  3:00 PM [facility]  08/09/2017  2:30 PM Byrnett, Merrily Pew, MD [facility] None   Primary Care Physician: Corky Downs, MD Location: [facility] Note by: Edward Jolly, MD Date: 06/21/2017; Time: 2:26 PM  Disclaimer:  Medicine is not an exact science. The only guarantee in medicine is that nothing is guaranteed. It is important to note that the decision to proceed with this intervention was based on the information collected from the patient. The Data and conclusions were drawn from the patient's questionnaire, the interview, and the physical examination. Because the information was provided in large part by the patient, it cannot be guaranteed that it has not been purposely or unconsciously manipulated. Every effort has been made to obtain as much relevant data as possible for this evaluation. It is important to note that the conclusions that lead to this procedure are derived in large part from the available data. Always take into account that the treatment will also be dependent on availability of resources and existing treatment guidelines, considered by other Pain Management Practitioners as being common knowledge and practice, at the time of the intervention. For Medico-Legal  purposes, it is also important to point out that variation in procedural techniques and pharmacological choices are the acceptable norm. The indications, contraindications, technique, and results of the above procedure should only be interpreted and judged by a Board-Certified Interventional Pain Specialist with extensive familiarity and expertise in the same exact procedure  and technique.

## 2017-06-21 NOTE — Patient Instructions (Signed)
You may apply ice to the injection site today.

## 2017-06-22 ENCOUNTER — Telehealth: Payer: Self-pay | Admitting: *Deleted

## 2017-06-22 NOTE — Telephone Encounter (Signed)
Attempted to call for post procedure follow-up. Message left. 

## 2017-07-11 ENCOUNTER — Other Ambulatory Visit: Payer: Self-pay

## 2017-07-11 ENCOUNTER — Encounter: Payer: Self-pay | Admitting: Student in an Organized Health Care Education/Training Program

## 2017-07-11 ENCOUNTER — Ambulatory Visit
Payer: Medicare Other | Attending: Student in an Organized Health Care Education/Training Program | Admitting: Student in an Organized Health Care Education/Training Program

## 2017-07-11 VITALS — BP 179/56 | HR 61 | Temp 97.7°F | Resp 14 | Ht 63.0 in | Wt 166.0 lb

## 2017-07-11 DIAGNOSIS — Z880 Allergy status to penicillin: Secondary | ICD-10-CM | POA: Insufficient documentation

## 2017-07-11 DIAGNOSIS — M5136 Other intervertebral disc degeneration, lumbar region: Secondary | ICD-10-CM | POA: Diagnosis not present

## 2017-07-11 DIAGNOSIS — G894 Chronic pain syndrome: Secondary | ICD-10-CM | POA: Insufficient documentation

## 2017-07-11 DIAGNOSIS — M170 Bilateral primary osteoarthritis of knee: Secondary | ICD-10-CM | POA: Diagnosis not present

## 2017-07-11 MED ORDER — LIDOCAINE HCL (PF) 1 % IJ SOLN
5.0000 mL | Freq: Once | INTRAMUSCULAR | Status: AC
  Administered 2017-07-11: 5 mL
  Filled 2017-07-11: qty 5

## 2017-07-11 MED ORDER — SODIUM HYALURONATE (VISCOSUP) 20 MG/2ML IX SOSY
2.0000 mL | PREFILLED_SYRINGE | Freq: Once | INTRA_ARTICULAR | Status: AC
  Administered 2017-07-11: 2 mL via INTRA_ARTICULAR
  Filled 2017-07-11: qty 2

## 2017-07-11 NOTE — Progress Notes (Signed)
Safety precautions to be maintained throughout the outpatient stay will include: orient to surroundings, keep bed in low position, maintain call bell within reach at all times, provide assistance with transfer out of bed and ambulation.  

## 2017-07-11 NOTE — Progress Notes (Signed)
Patient's Name: Meagan Wilcox  MRN: [id number]  Referring Provider: Corky DownsMasoud, Javed, MD  DOB: 01-06-1931  PCP: Corky DownsMasoud, Javed, MD  DOS: 07/11/2017  Note by: Edward JollyBilal Anne Sebring, MD  Service setting: Ambulatory outpatient  Specialty: Interventional Pain Management  Patient type: Established  Location: [facility] (AMB) Pain Management Facility  Visit type: Interventional Procedure   Primary Reason for Visit: Interventional Pain Management Treatment. CC: Knee Pain (bilateral)  Procedure:  Anesthesia, Analgesia, Anxiolysis:  Type: Diagnostic Intra-Articular Hyalgan Knee Injection #3 Region: Medial infrapatellar Knee Region Level: Knee Joint Laterality: Bilateral  Type: Local Anesthesia Local Anesthetic: Lidocaine 1% Route: Infiltration ([place]/IM) IV Access: Declined Sedation: Declined  Indication(s): Analgesia           Indications: 1. Primary osteoarthritis of both knees   2. Chronic pain syndrome   3. Lumbar degenerative disc disease    Pain Score: Pre-procedure: 2 /10 Post-procedure: 0-No pain(numb)/10  Pre-op Assessment:  Meagan Wilcox is a 83 y.o. (year old), female patient, seen today for interventional treatment. She  has a past surgical history that includes Appendectomy (1939); Colonoscopy (2010); cataract surgery  (Bilateral, 2009); left breast biopsy  (2009); Breast surgery (Left, 2013); Breast surgery (Left, May 2010); Breast biopsy (Left, 02-24-14); Breast biopsy (Left, 02/11/2016); Breast excisional biopsy (Left, 2014); and Breast biopsy (Left, 2013). Meagan Wilcox has a current medication list which includes the following prescription(s): amitriptyline, aspirin, calcium carbonate-vit d-min, vitamin d3, diazepam, levothyroxine, losartan-hydrochlorothiazide, omeprazole, pentosan polysulfate, and simvastatin. Her primarily concern today is the Knee Pain (bilateral)  Initial Vital Signs: There were no vitals taken for this visit. BMI: Estimated body mass index is 29.41 kg/m as calculated from the  following:   Height as of this encounter: 5\' 3"  (1.6 m).   Weight as of this encounter: 166 lb (75.3 kg).  Risk Assessment: Allergies: Reviewed. She is allergic to amoxicillin and penicillins.  Allergy Precautions: None required Coagulopathies: Reviewed. None identified.  Blood-thinner therapy: None at this time Active Infection(s): Reviewed. None identified. Meagan Wilcox is afebrile  Site Confirmation: Meagan Wilcox was asked to confirm the procedure and laterality before marking the site Procedure checklist: Completed Consent: Before the procedure and under the influence of no sedative(s), amnesic(s), or anxiolytics, the patient was informed of the treatment options, risks and possible complications. To fulfill our ethical and legal obligations, as recommended by the American Medical Association's Code of Ethics, I have informed the patient of my clinical impression; the nature and purpose of the treatment or procedure; the risks, benefits, and possible complications of the intervention; the alternatives, including doing nothing; the risk(s) and benefit(s) of the alternative treatment(s) or procedure(s); and the risk(s) and benefit(s) of doing nothing. The patient was provided information about the general risks and possible complications associated with the procedure. These may include, but are not limited to: failure to achieve desired goals, infection, bleeding, organ or nerve damage, allergic reactions, paralysis, and death. In addition, the patient was informed of those risks and complications associated to the procedure, such as failure to decrease pain; infection; bleeding; organ or nerve damage with subsequent damage to sensory, motor, and/or autonomic systems, resulting in permanent pain, numbness, and/or weakness of one or several areas of the body; allergic reactions; (i.e.: anaphylactic reaction); and/or death. Furthermore, the patient was informed of those risks and complications associated  with the medications. These include, but are not limited to: allergic reactions (i.e.: anaphylactic or anaphylactoid reaction(s)); adrenal axis suppression; blood sugar elevation that in diabetics may result in ketoacidosis or comma;  water retention that in patients with history of congestive heart failure may result in shortness of breath, pulmonary edema, and decompensation with resultant heart failure; weight gain; swelling or edema; medication-induced neural toxicity; particulate matter embolism and blood vessel occlusion with resultant organ, and/or nervous system infarction; and/or aseptic necrosis of one or more joints. Finally, the patient was informed that Medicine is not an exact science; therefore, there is also the possibility of unforeseen or unpredictable risks and/or possible complications that may result in a catastrophic outcome. The patient indicated having understood very clearly. We have given the patient no guarantees and we have made no promises. Enough time was given to the patient to ask questions, all of which were answered to the patient's satisfaction. Meagan Wilcox has indicated that she wanted to continue with the procedure. Attestation: I, the ordering provider, attest that I have discussed with the patient the benefits, risks, side-effects, alternatives, likelihood of achieving goals, and potential problems during recovery for the procedure that I have provided informed consent. Date: 07/11/2017; Time: 1:34 PM  Pre-Procedure Preparation:  Monitoring: As per clinic protocol. Respiration, ETCO2, SpO2, BP, heart rate and rhythm monitor placed and checked for adequate function Safety Precautions: Patient was assessed for positional comfort and pressure points before starting the procedure. Time-out: I initiated and conducted the "Time-out" before starting the procedure, as per protocol. The patient was asked to participate by confirming the accuracy of the "Time Out" information.  Verification of the correct person, site, and procedure were performed and confirmed by me, the nursing staff, and the patient. "Time-out" conducted as per Joint Commission's Universal Protocol (UP.01.01.01). "Time-out" Date & Time: 07/11/2017;   hrs.  Description of Procedure Process:   Position: Sitting Target Area: Knee Joint Approach: Just above the Medial tibial plateau, medial to the infrapatellar tendon. Area Prepped: Entire knee area, from the mid-thigh to the mid-shin. Prepping solution: ChloraPrep (2% chlorhexidine gluconate and 70% isopropyl alcohol) Safety Precautions: Aspiration looking for blood return was conducted prior to all injections. At no point did we inject any substances, as a needle was being advanced. No attempts were made at seeking any paresthesias. Safe injection practices and needle disposal techniques used. Medications properly checked for expiration dates. SDV (single dose vial) medications used. Description of the Procedure: Protocol guidelines were followed. The patient was placed in position over the fluoroscopy table. The target area was identified and the area prepped in the usual manner. Skin desensitized using vapocoolant spray. Skin & deeper tissues infiltrated with local anesthetic. Appropriate amount of time allowed to pass for local anesthetics to take effect. The procedure needles were then advanced to the target area. Proper needle placement secured. Negative aspiration confirmed. Solution injected in intermittent fashion, asking for systemic symptoms every 0.5cc of injectate. The needles were then removed and the area cleansed, making sure to leave some of the prepping solution back to take advantage of its long term bactericidal properties. Vitals:   07/11/17 1019 07/11/17 1036 07/11/17 1140  BP: (!) 171/60 (!) 172/56 (!) 179/56  Pulse: 66  61  Resp: 14    Temp: 97.7 F (36.5 C)    TempSrc: Oral    SpO2: 99%    Weight: 166 lb (75.3 kg)    Height: 5'  3" (1.6 m)      Start Time:   hrs. End Time:   hrs. Materials:  Needle(s) Type: Regular needle Gauge: 25G Length: 1.5-in Medication(s): We administered lidocaine (PF), Sodium Hyaluronate, and Sodium Hyaluronate. Please see chart  orders for dosing details. 2 cc of Hyalgan injected into each knee Imaging Guidance:  Type of Imaging Technique: None used Indication(s): N/A Exposure Time: No patient exposure Contrast: None used. Fluoroscopic Guidance: N/A Ultrasound Guidance: N/A Interpretation: N/A  Antibiotic Prophylaxis:  Indication(s): None identified Antibiotic given: None  Post-operative Assessment:  EBL: None Complications: No immediate post-treatment complications observed by team, or reported by patient. Note: The patient tolerated the entire procedure well. A repeat set of vitals were taken after the procedure and the patient was kept under observation following institutional policy, for this type of procedure. Post-procedural neurological assessment was performed, showing return to baseline, prior to discharge. The patient was provided with post-procedure discharge instructions, including a section on how to identify potential problems. Should any problems arise concerning this procedure, the patient was given instructions to immediately contact us, at any time, without hesitation. In any case, we plan to contact the patient by telephone for a follow-up status report regarding this interventional procedure. Comments:  No additional relevant information. 5 out of 5 strength bilateral lower extremity: Plantar flexion, dorsiflexion, knee flexion, knee extension.  Plan of Care  Follow up in 3 weeks for pp evaluation   Imaging Orders  No imaging studies ordered today   Procedure Orders    No procedure(s) ordered today    Medications ordered for procedure: Meds ordered this encounter  Medications  . lidocaine (PF) (XYLOCAINE) 1 % injection 5 mL  . Sodium Hyaluronate SOSY 2  mL  . Sodium Hyaluronate SOSY 2 mL   Medications administered: We administered lidocaine (PF), Sodium Hyaluronate, and Sodium Hyaluronate.  See the medical record for exact dosing, route, and time of administration.  This SmartLink is deprecated. Use AVSMEDLIST instead to display the medication list for a patient. Disposition: Discharge home  Discharge Date & Time: 07/11/2017; 1142 hrs.   Physician-requested Follow-up: Return in about 3 weeks (around 08/01/2017) for Post Procedure Evaluation. Future Appointments  Date Time Provider Department Center  08/01/2017  1:30 PM Edward Jolly, MD [facility] None  08/02/2017  3:00 PM [facility]  08/09/2017  2:30 PM Byrnett, Merrily Pew, MD [facility] None   Primary Care Physician: Corky Downs, MD Location: [facility] Note by: Edward Jolly, MD Date: 07/11/2017; Time: 12:52 PM  Disclaimer:  Medicine is not an exact science. The only guarantee in medicine is that nothing is guaranteed. It is important to note that the decision to proceed with this intervention was based on the information collected from the patient. The Data and conclusions were drawn from the patient's questionnaire, the interview, and the physical examination. Because the information was provided in large part by the patient, it cannot be guaranteed that it has not been purposely or unconsciously manipulated. Every effort has been made to obtain as much relevant data as possible for this evaluation. It is important to note that the conclusions that lead to this procedure are derived in large part from the available data. Always take into account that the treatment will also be dependent on availability of resources and existing treatment guidelines, considered by other Pain Management Practitioners as being common knowledge and practice, at the time of the intervention. For Medico-Legal purposes, it is also important to point out that variation in  procedural techniques and pharmacological choices are the acceptable norm. The indications, contraindications, technique, and results of the above procedure should only be interpreted and judged by a Board-Certified Interventional Pain Specialist with extensive familiarity and expertise in  the same exact procedure and technique.

## 2017-08-01 ENCOUNTER — Ambulatory Visit
Payer: Medicare Other | Attending: Student in an Organized Health Care Education/Training Program | Admitting: Student in an Organized Health Care Education/Training Program

## 2017-08-01 ENCOUNTER — Other Ambulatory Visit: Payer: Self-pay

## 2017-08-01 ENCOUNTER — Encounter: Payer: Self-pay | Admitting: Student in an Organized Health Care Education/Training Program

## 2017-08-01 VITALS — BP 145/58 | HR 71 | Temp 98.2°F | Resp 16 | Ht 63.0 in | Wt 167.0 lb

## 2017-08-01 DIAGNOSIS — M51369 Other intervertebral disc degeneration, lumbar region without mention of lumbar back pain or lower extremity pain: Secondary | ICD-10-CM

## 2017-08-01 DIAGNOSIS — M170 Bilateral primary osteoarthritis of knee: Secondary | ICD-10-CM | POA: Diagnosis not present

## 2017-08-01 DIAGNOSIS — M5136 Other intervertebral disc degeneration, lumbar region: Secondary | ICD-10-CM | POA: Diagnosis not present

## 2017-08-01 DIAGNOSIS — M48061 Spinal stenosis, lumbar region without neurogenic claudication: Secondary | ICD-10-CM

## 2017-08-01 DIAGNOSIS — M9983 Other biomechanical lesions of lumbar region: Secondary | ICD-10-CM

## 2017-08-01 DIAGNOSIS — G894 Chronic pain syndrome: Secondary | ICD-10-CM | POA: Diagnosis not present

## 2017-08-01 DIAGNOSIS — Z7982 Long term (current) use of aspirin: Secondary | ICD-10-CM | POA: Insufficient documentation

## 2017-08-01 DIAGNOSIS — I10 Essential (primary) hypertension: Secondary | ICD-10-CM | POA: Insufficient documentation

## 2017-08-01 DIAGNOSIS — M25562 Pain in left knee: Secondary | ICD-10-CM | POA: Insufficient documentation

## 2017-08-01 DIAGNOSIS — E039 Hypothyroidism, unspecified: Secondary | ICD-10-CM | POA: Diagnosis not present

## 2017-08-01 DIAGNOSIS — M25561 Pain in right knee: Secondary | ICD-10-CM | POA: Insufficient documentation

## 2017-08-01 DIAGNOSIS — E785 Hyperlipidemia, unspecified: Secondary | ICD-10-CM | POA: Insufficient documentation

## 2017-08-01 DIAGNOSIS — K219 Gastro-esophageal reflux disease without esophagitis: Secondary | ICD-10-CM | POA: Diagnosis not present

## 2017-08-01 DIAGNOSIS — Z79899 Other long term (current) drug therapy: Secondary | ICD-10-CM | POA: Diagnosis not present

## 2017-08-01 DIAGNOSIS — M5116 Intervertebral disc disorders with radiculopathy, lumbar region: Secondary | ICD-10-CM | POA: Insufficient documentation

## 2017-08-01 DIAGNOSIS — N6092 Unspecified benign mammary dysplasia of left breast: Secondary | ICD-10-CM | POA: Diagnosis not present

## 2017-08-01 DIAGNOSIS — M5416 Radiculopathy, lumbar region: Secondary | ICD-10-CM

## 2017-08-01 HISTORY — DX: Chronic pain syndrome: G89.4

## 2017-08-01 NOTE — Progress Notes (Signed)
Patient's Name: Meagan Wilcox  MRN: [id number]  Referring Provider: Corky DownsMasoud, Javed, MD  DOB: 05/24/1931  PCP: Corky DownsMasoud, Javed, MD  DOS: 08/01/2017  Note by: Edward JollyBilal Claudette Wermuth, MD  Service setting: Ambulatory outpatient  Specialty: Interventional Pain Management  Location: [facility] (AMB) Pain Management Facility    Patient type: Established   Primary Reason(s) for Visit: Encounter for post-procedure evaluation of chronic illness with mild to moderate exacerbation CC: Knee Pain (bilateral)  HPI  [name] is a 83 y.o y.o. year old, female patient, who comes today for a post-procedure evaluation. She has Hypertension; Atypical ductal hyperplasia of left breast; Lumbar radiculopathy; Neuroforaminal stenosis of lumbar spine; Lumbar degenerative disc disease; and Chronic pain syndrome on their problem list. Her primarily concern today is the Knee Pain (bilateral)  Pain Assessment: Location:   Knee Radiating:   Onset: More than a month ago Duration:   Quality: Aching Severity: 1 /10 (self-reported pain score)  Note: Reported level is compatible with observation.                         When using our objective Pain Scale, levels between 6 and 10/10 are said to belong in an emergency room, as it progressively worsens from a 6/10, described as severely limiting, requiring emergency care not usually available at an outpatient pain management facility. At a 6/10 level, communication becomes difficult and requires great effort. Assistance to reach the emergency department may be required. Facial flushing and profuse sweating along with potentially dangerous increases in heart rate and blood pressure will be evident. Effect on ADL:   Timing: Intermittent Modifying factors: sitting  [name] comes in today for post-procedure evaluation after the treatment done on 07/11/2017.  Further details on both, my assessment(s), as well as the proposed treatment plan, please see below.  Post-Procedure Assessment  07/11/2017  Procedure: Bilateral knee Hyalgan injections  Influential Factors: BMI: 29.58 kg/m Intra-procedural challenges: None observed.         Assessment challenges: None detected.              Reported side-effects: None.        Post-procedural adverse reactions or complications: None reported          Current benefits: Defined as reported results that persistent at this point in time.   Analgesia: 0 %            Function: No benefit ROM: No benefit Interpretative annotation: No benefit. Therapeutic failure. Results would argue against repeating therapy.          Interpretation: Results would suggest failure of therapy in achieving desired goal(s).                  Plan:  Please see "Plan of Care" for details.         Meds   Current Outpatient Medications:  .  amitriptyline (ELAVIL) 25 MG tablet, Take 25 mg by mouth at bedtime., Disp: , Rfl:  .  aspirin 81 MG chewable tablet, Chew 81 mg by mouth daily., Disp: , Rfl:  .  Calcium Carbonate-Vit D-Min (CALCIUM 1200 PO), Take 1 tablet by mouth daily., Disp: , Rfl:  .  Cholecalciferol (VITAMIN D3) 1000 units CAPS, Take 2,000 Units by mouth at bedtime. , Disp: , Rfl:  .  diazepam (VALIUM) 5 MG tablet, Take 5 mg as needed by mouth for anxiety., Disp: , Rfl:  .  levothyroxine (SYNTHROID, LEVOTHROID) 100 MCG tablet, Take 100 mcg by  mouth daily., Disp: , Rfl:  .  losartan-hydrochlorothiazide (HYZAAR) 100-12.5 MG tablet, Take 1 tablet daily by mouth., Disp: , Rfl:  .  omeprazole (PRILOSEC) 20 MG capsule, Take 1 capsule by mouth daily., Disp: , Rfl:  .  pentosan polysulfate (ELMIRON) 100 MG capsule, Take 100 mg by mouth 2 (two) times daily. , Disp: , Rfl:  .  simvastatin (ZOCOR) 20 MG tablet, Take 20 mg by mouth every evening., Disp: , Rfl:   ROS  Constitutional: Denies any fever or chills Gastrointestinal: No reported hemesis, hematochezia, vomiting, or acute GI distress Musculoskeletal: Denies any acute onset joint swelling, redness, loss of  ROM, or weakness Neurological: No reported episodes of acute onset apraxia, aphasia, dysarthria, agnosia, amnesia, paralysis, loss of coordination, or loss of consciousness  Allergies  Ms. Igo is allergic to amoxicillin and penicillins.  PFSH  Drug: Ms. Sanabia  reports that she does not use drugs. Alcohol:  reports that she does not drink alcohol. Tobacco:  reports that  has never smoked. she has never used smokeless tobacco. Medical:  has a past medical history of Abnormal mammogram, unspecified (2011), Arthritis (2002), GERD (gastroesophageal reflux disease), Heart murmur (1985), Hyperlipidemia, Hypertension (2005), and Hypothyroidism. Surgical: Ms. Chavous  has a past surgical history that includes Appendectomy (1939); Colonoscopy (2010); cataract surgery  (Bilateral, 2009); left breast biopsy  (2009); Breast surgery (Left, 2013); Breast surgery (Left, May 2010); Breast biopsy (Left, 02-24-14); Breast biopsy (Left, 02/11/2016); Breast excisional biopsy (Left, 2014); and Breast biopsy (Left, 2013). Family: family history is not on file.  Constitutional Exam  General appearance: Well nourished, well developed, and well hydrated. In no apparent acute distress Vitals:   08/01/17 1336  BP: (!) 145/58  Pulse: 71  Resp: 16  Temp: 98.2 F (36.8 C)  TempSrc: Oral  SpO2: 100%  Weight: 167 lb (75.8 kg)  Height: 5\' 3"  (1.6 m)   BMI Assessment: Estimated body mass index is 29.58 kg/m as calculated from the following:   Height as of this encounter: 5\' 3"  (1.6 m).   Weight as of this encounter: 167 lb (75.8 kg).  BMI interpretation table: BMI level Category Range association with higher incidence of chronic pain  <18 kg/m2 Underweight   18.5-24.9 kg/m2 Ideal body weight   25-29.9 kg/m2 Overweight Increased incidence by 20%  30-34.9 kg/m2 Obese (Class I) Increased incidence by 68%  35-39.9 kg/m2 Severe obesity (Class II) Increased incidence by 136%  >40 kg/m2 Extreme obesity (Class III)  Increased incidence by 254%   BMI Readings from Last 4 Encounters:  08/01/17 29.58 kg/m  07/11/17 29.41 kg/m  06/21/17 30.11 kg/m  06/07/17 30.11 kg/m   Wt Readings from Last 4 Encounters:  08/01/17 167 lb (75.8 kg)  07/11/17 166 lb (75.3 kg)  06/21/17 170 lb (77.1 kg)  06/07/17 170 lb (77.1 kg)  Psych/Mental status: Alert, oriented x 3 (person, place, & time)       Eyes: PERLA Respiratory: No evidence of acute respiratory distress  Cervical Spine Area Exam  Skin & Axial Inspection: No masses, redness, edema, swelling, or associated skin lesions Alignment: Symmetrical Functional ROM: Unrestricted ROM      Stability: No instability detected Muscle Tone/Strength: Functionally intact. No obvious neuro-muscular anomalies detected. Sensory (Neurological): Unimpaired Palpation: No palpable anomalies              Upper Extremity (UE) Exam    Side: Right upper extremity  Side: Left upper extremity  Skin & Extremity Inspection: Skin color, temperature, and hair  growth are WNL. No peripheral edema or cyanosis. No masses, redness, swelling, asymmetry, or associated skin lesions. No contractures.  Skin & Extremity Inspection: Skin color, temperature, and hair growth are WNL. No peripheral edema or cyanosis. No masses, redness, swelling, asymmetry, or associated skin lesions. No contractures.  Functional ROM: Unrestricted ROM          Functional ROM: Unrestricted ROM          Muscle Tone/Strength: Functionally intact. No obvious neuro-muscular anomalies detected.  Muscle Tone/Strength: Functionally intact. No obvious neuro-muscular anomalies detected.  Sensory (Neurological): Unimpaired          Sensory (Neurological): Unimpaired          Palpation: No palpable anomalies              Palpation: No palpable anomalies              Specialized Test(s): Deferred         Specialized Test(s): Deferred          Thoracic Spine Area Exam  Skin & Axial Inspection: No masses, redness, or  swelling Alignment: Symmetrical Functional ROM: Unrestricted ROM Stability: No instability detected Muscle Tone/Strength: Functionally intact. No obvious neuro-muscular anomalies detected. Sensory (Neurological): Unimpaired Muscle strength & Tone: No palpable anomalies  Lumbar Spine Area Exam  Skin & Axial Inspection: No masses, redness, or swelling Alignment: Symmetrical Functional ROM: Unrestricted ROM      Stability: No instability detected Muscle Tone/Strength: Functionally intact. No obvious neuro-muscular anomalies detected. Sensory (Neurological): Unimpaired Palpation: No palpable anomalies       Provocative Tests: Lumbar Hyperextension and rotation test: Positive       Lumbar Lateral bending test: Positive       Patrick's Maneuver: evaluation deferred today                    Gait & Posture Assessment  Ambulation: Unassisted Gait: Relatively normal for age and body habitus Posture: WNL   Lower Extremity Exam    Side: Right lower extremity  Side: Left lower extremity  Skin & Extremity Inspection: Skin color, temperature, and hair growth are WNL. No peripheral edema or cyanosis. No masses, redness, swelling, asymmetry, or associated skin lesions. No contractures.  Skin & Extremity Inspection: Skin color, temperature, and hair growth are WNL. No peripheral edema or cyanosis. No masses, redness, swelling, asymmetry, or associated skin lesions. No contractures.  Functional ROM: Unrestricted ROM          Functional ROM: Unrestricted ROM          Muscle Tone/Strength: Functionally intact. No obvious neuro-muscular anomalies detected.  Muscle Tone/Strength: Functionally intact. No obvious neuro-muscular anomalies detected.  Sensory (Neurological): Unimpaired  Sensory (Neurological): Unimpaired  Palpation: Tender  Palpation: Tender  Bilateral patellas tender to palpation. Assessment  Primary Diagnosis & Pertinent Problem List: The primary encounter diagnosis was Lumbar  radiculopathy. Diagnoses of Neuroforaminal stenosis of lumbar spine, Lumbar degenerative disc disease, and Chronic pain syndrome were also pertinent to this visit.  Status Diagnosis  Persistent Persistent Persistent 1. Lumbar radiculopathy   2. Neuroforaminal stenosis of lumbar spine   3. Lumbar degenerative disc disease   4. Chronic pain syndrome      83 year old female with a history of chronic pain secondary to lumbar degenerative disc disease, chronic lumbar radiculopathy, bilateral knee osteoarthritis who presents for follow-up status post a series of 3 intra-articular Hyalgan knee injections.  She states that the knee injections have not been very helpful for her  knee pain.  Patient does endorse significant benefit that is ongoing although not at the same level after her lumbar epidural steroid injection on May 17, 2017.  Patient states that she is having recurrence of her radicular symptoms and is interested in repeating epidural steroid injection.  I think this is reasonable especially since the patient obtains such great benefit after her first epidural steroid injection.  We will try to increase the volume injected into her lumbar epidural space and will proceed with more of the midline injections since the patient endorses worsening right knee pain along with chronic left knee pain that could be secondary to L3-L4 radiculopathy.  She also has difficulty walking an extended period of time which could be secondary to neurogenic claudication.  Risks and benefits of the procedure were discussed and patient would like to proceed.  Plan: -Repeat lumbar epidural steroid injection (L4-5 or L5-S1) targeted towards the midline with sedation.  Future: -Consideration of bilateral genicular nerve block with steroid if persistent knee pain that is not improved with repeat epidural steroid injection.  Time Note: Greater than 50% of the 25 minute(s) of face-to-face time spent with [name], was  spent in counseling/coordination of care regarding: Ms. Aurelio BrashFitch's primary cause of pain, the treatment plan, treatment alternatives, the risks and possible complications of proposed treatment, going over the informed consent and realistic expectations.  Provider-requested follow-up: Return in about 3 weeks (around 08/21/2017) for Procedure.  Future Appointments  Date Time Provider Department Center  08/02/2017  3:00 PM [facility]  08/09/2017  2:30 PM Byrnett, Merrily PewJeffrey W, MD [facility] None  08/21/2017 10:15 AM Edward JollyLateef, Mark Benecke, MD [facility] None    Primary Care Physician: Corky DownsMasoud, Javed, MD [facility] Note by: Edward JollyBilal Dayshon Roback, M.D Date: 08/01/2017; Time: 4:52 PM  Patient Instructions  Epidural Steroid Injection Patient Information  Description: The epidural space surrounds the nerves as they exit the spinal cord.  In some patients, the nerves can be compressed and inflamed by a bulging disc or a tight spinal canal (spinal stenosis).  By injecting steroids into the epidural space, we can bring irritated nerves into direct contact with a potentially helpful medication.  These steroids act directly on the irritated nerves and can reduce swelling and inflammation which often leads to decreased pain.  Epidural steroids may be injected anywhere along the spine and from the neck to the low back depending upon the location of your pain.   After numbing the skin with local anesthetic (like Novocaine), a small needle is passed into the epidural space slowly.  You may experience a sensation of pressure while this is being done.  The entire block usually last less than 10 minutes.  Conditions which may be treated by epidural steroids:   Low back and leg pain  Neck and arm pain  Spinal stenosis  Post-laminectomy syndrome  Herpes zoster (shingles) pain  Pain from compression fractures  Preparation for the injection:  1. Do not eat any solid food or dairy  products within 8 hours of your appointment.  2. You may drink clear liquids up to 3 hours before appointment.  Clear liquids include water, black coffee, juice or soda.  No milk or cream please. 3. You may take your regular medication, including pain medications, with a sip of water before your appointment  Diabetics should hold regular insulin (if taken separately) and take 1/2 normal NPH dos the morning of the procedure.  Carry some sugar containing items with  you to your appointment. 4. A driver must accompany you and be prepared to drive you home after your procedure.  5. Bring all your current medications with your. 6. An IV may be inserted and sedation may be given at the discretion of the physician.   7. A blood pressure cuff, EKG and other monitors will often be applied during the procedure.  Some patients may need to have extra oxygen administered for a short period. 8. You will be asked to provide medical information, including your allergies, prior to the procedure.  We must know immediately if you are taking blood thinners (like Coumadin/Warfarin)  Or if you are allergic to IV iodine contrast (dye). We must know if you could possible be pregnant.  Possible side-effects:  Bleeding from needle site  Infection (rare, may require surgery)  Nerve injury (rare)  Numbness & tingling (temporary)  Difficulty urinating (rare, temporary)  Spinal headache ( a headache worse with upright posture)  Light -headedness (temporary)  Pain at injection site (several days)  Decreased blood pressure (temporary)  Weakness in arm/leg (temporary)  Pressure sensation in back/neck (temporary)  Call if you experience:  Fever/chills associated with headache or increased back/neck pain.  Headache worsened by an upright position.  New onset weakness or numbness of an extremity below the injection site  Hives or difficulty breathing (go to the emergency room)  Inflammation or drainage at the  infection site  Severe back/neck pain  Any new symptoms which are concerning to you  Please note:  Although the local anesthetic injected can often make your back or neck feel good for several hours after the injection, the pain will likely return.  It takes 3-7 days for steroids to work in the epidural space.  You may not notice any pain relief for at least that one week.  If effective, we will often do a series of three injections spaced 3-6 weeks apart to maximally decrease your pain.  After the initial series, we generally will wait several months before considering a repeat injection of the same type.  If you have any questions, please call [phone] [facility]

## 2017-08-01 NOTE — Patient Instructions (Signed)
Epidural Steroid Injection Patient Information  Description: The epidural space surrounds the nerves as they exit the spinal cord.  In some patients, the nerves can be compressed and inflamed by a bulging disc or a tight spinal canal (spinal stenosis).  By injecting steroids into the epidural space, we can bring irritated nerves into direct contact with a potentially helpful medication.  These steroids act directly on the irritated nerves and can reduce swelling and inflammation which often leads to decreased pain.  Epidural steroids may be injected anywhere along the spine and from the neck to the low back depending upon the location of your pain.   After numbing the skin with local anesthetic (like Novocaine), a small needle is passed into the epidural space slowly.  You may experience a sensation of pressure while this is being done.  The entire block usually last less than 10 minutes.  Conditions which may be treated by epidural steroids:   Low back and leg pain  Neck and arm pain  Spinal stenosis  Post-laminectomy syndrome  Herpes zoster (shingles) pain  Pain from compression fractures  Preparation for the injection:  1. Do not eat any solid food or dairy products within 8 hours of your appointment.  2. You may drink clear liquids up to 3 hours before appointment.  Clear liquids include water, black coffee, juice or soda.  No milk or cream please. 3. You may take your regular medication, including pain medications, with a sip of water before your appointment  Diabetics should hold regular insulin (if taken separately) and take 1/2 normal NPH dos the morning of the procedure.  Carry some sugar containing items with you to your appointment. 4. A driver must accompany you and be prepared to drive you home after your procedure.  5. Bring all your current medications with your. 6. An IV may be inserted and sedation may be given at the discretion of the physician.   7. A blood pressure  cuff, EKG and other monitors will often be applied during the procedure.  Some patients may need to have extra oxygen administered for a short period. 8. You will be asked to provide medical information, including your allergies, prior to the procedure.  We must know immediately if you are taking blood thinners (like Coumadin/Warfarin)  Or if you are allergic to IV iodine contrast (dye). We must know if you could possible be pregnant.  Possible side-effects:  Bleeding from needle site  Infection (rare, may require surgery)  Nerve injury (rare)  Numbness & tingling (temporary)  Difficulty urinating (rare, temporary)  Spinal headache ( a headache worse with upright posture)  Light -headedness (temporary)  Pain at injection site (several days)  Decreased blood pressure (temporary)  Weakness in arm/leg (temporary)  Pressure sensation in back/neck (temporary)  Call if you experience:  Fever/chills associated with headache or increased back/neck pain.  Headache worsened by an upright position.  New onset weakness or numbness of an extremity below the injection site  Hives or difficulty breathing (go to the emergency room)  Inflammation or drainage at the infection site  Severe back/neck pain  Any new symptoms which are concerning to you  Please note:  Although the local anesthetic injected can often make your back or neck feel good for several hours after the injection, the pain will likely return.  It takes 3-7 days for steroids to work in the epidural space.  You may not notice any pain relief for at least that one week.    If effective, we will often do a series of three injections spaced 3-6 weeks apart to maximally decrease your pain.  After the initial series, we generally will wait several months before considering a repeat injection of the same type.  If you have any questions, please call [phone] [facility] 

## 2017-08-01 NOTE — Progress Notes (Signed)
Safety precautions to be maintained throughout the outpatient stay will include: orient to surroundings, keep bed in low position, maintain call bell within reach at all times, provide assistance with transfer out of bed and ambulation.  

## 2017-08-02 ENCOUNTER — Ambulatory Visit
Admission: RE | Admit: 2017-08-02 | Discharge: 2017-08-02 | Disposition: A | Discharge status: Home / Self Care | Payer: Medicare Other | Source: Ambulatory Visit | Attending: General Surgery | Admitting: General Surgery

## 2017-08-02 DIAGNOSIS — Z1231 Encounter for screening mammogram for malignant neoplasm of breast: Secondary | ICD-10-CM | POA: Insufficient documentation

## 2017-08-09 ENCOUNTER — Ambulatory Visit: Payer: Medicare Other | Admitting: General Surgery

## 2017-08-09 ENCOUNTER — Encounter: Payer: Self-pay | Admitting: General Surgery

## 2017-08-09 VITALS — BP 166/76 | HR 69 | Resp 14 | Ht 63.0 in | Wt 168.0 lb

## 2017-08-09 DIAGNOSIS — N6092 Unspecified benign mammary dysplasia of left breast: Secondary | ICD-10-CM | POA: Diagnosis not present

## 2017-08-09 NOTE — Patient Instructions (Signed)
Patient will be asked to return to the office in one year with a bilateral screening mammogram. 

## 2017-08-09 NOTE — Progress Notes (Signed)
Patient ID: Meagan Wilcox, female   DOB: 1931-06-01, 83 y.o.   MRN: [id number]  Chief Complaint  Patient presents with  . Follow-up    HPI Meagan Mesilsie M Birky is a 83 y.o. female.  who presents for her follow up left breast ADH and a breast evaluation. The most recent mammogram was done on 08-02-17.  Patient does perform regular self breast checks and gets regular mammograms done.  Occasional "drawing" sensation left breast. She has recently had steroid injection to the back.  HPI  Past Medical History:  Diagnosis Date  . Abnormal mammogram, unspecified 2011  . Arthritis 2002  . GERD (gastroesophageal reflux disease)   . Heart murmur 1985  . Hyperlipidemia   . Hypertension 2005  . Hypothyroidism     Past Surgical History:  Procedure Laterality Date  . APPENDECTOMY  1939  . BREAST BIOPSY Left 02-24-14   benign  . BREAST BIOPSY Left 02/11/2016   neg  . BREAST BIOPSY Left 2013   neg  . BREAST EXCISIONAL BIOPSY Left 2014   neg  . BREAST SURGERY Left 2013   stereotactic biopsy   . BREAST SURGERY Left May 2010   1 mm foci of atypical ductal hyperplasia  . cataract surgery  Bilateral 2009  . COLONOSCOPY  2010   Dr. Mechele CollinElliott  . left breast biopsy   2009    No family history on file.  Social History Social History   Tobacco Use  . Smoking status: Never Smoker  . Smokeless tobacco: Never Used  Substance Use Topics  . Alcohol use: No    Alcohol/week: 0.0 oz  . Drug use: No    Allergies  Allergen Reactions  . Amoxicillin Diarrhea  . Penicillins Rash    Current Outpatient Medications  Medication Sig Dispense Refill  . amitriptyline (ELAVIL) 25 MG tablet Take 25 mg by mouth at bedtime.    Marland Kitchen. aspirin 81 MG chewable tablet Chew 81 mg by mouth daily.    . Calcium Carbonate-Vit D-Min (CALCIUM 1200 PO) Take 1 tablet by mouth daily.    . Cholecalciferol (VITAMIN D3) 1000 units CAPS Take 2,000 Units by mouth at bedtime.     . diazepam (VALIUM) 5 MG tablet Take 5 mg as needed  by mouth for anxiety.    Marland Kitchen. levothyroxine (SYNTHROID, LEVOTHROID) 100 MCG tablet Take 100 mcg by mouth daily.    Marland Kitchen. losartan-hydrochlorothiazide (HYZAAR) 100-12.5 MG tablet Take 1 tablet daily by mouth.    Marland Kitchen. omeprazole (PRILOSEC) 20 MG capsule Take 1 capsule by mouth daily.    . pentosan polysulfate (ELMIRON) 100 MG capsule Take 100 mg by mouth 2 (two) times daily.     . simvastatin (ZOCOR) 20 MG tablet Take 20 mg by mouth every evening.     No current facility-administered medications for this visit.     Review of Systems Review of Systems  Constitutional: Negative.   Respiratory: Negative.   Cardiovascular: Negative.     Blood pressure (!) 166/76, pulse 69, resp. rate 14, height 5\' 3"  (1.6 m), weight 168 lb (76.2 kg), SpO2 97 %.  Physical Exam Physical Exam  Constitutional: She is oriented to person, place, and time. She appears well-developed and well-nourished.  HENT:  Mouth/Throat: Oropharynx is clear and moist.  Eyes: Conjunctivae are normal. No scleral icterus.  Neck: Neck supple.  Cardiovascular: Normal rate, regular rhythm and normal heart sounds.  Pulmonary/Chest: Effort normal and breath sounds normal. Right breast exhibits no inverted nipple, no mass, no nipple  discharge, no skin change and no tenderness. Left breast exhibits no inverted nipple, no mass, no nipple discharge, no skin change and no tenderness.  Lymphadenopathy:    She has no cervical adenopathy.    She has no axillary adenopathy.  Neurological: She is alert and oriented to person, place, and time.  Skin: Skin is warm and dry.  Psychiatric: Her behavior is normal.    Data Reviewed August 03, 2017 screening mammograms were reviewed.  No interval change.  BI-RADS-1.  Assessment    Benign breast exam.  Past history ADH.    Plan    Based on the patient's excellent functional status, I would recommend continuing screening mammograms.  Should be accompanied by a clinical breast exam.     Patient  will be asked to return to the office in one year with a bilateral screening mammogram.   HPI, Physical Exam, Assessment and Plan have been scribed under the direction and in the presence of Earline MayotteJeffrey W. Byrnett, MD. Dorathy DaftMarsha Hatch, RN   I have completed the exam and reviewed the above documentation for accuracy and completeness.  I agree with the above.  [employer] has been used and any errors in dictation or transcription are unintentional.  Donnalee CurryJeffrey Byrnett, M.D., F.A.C.S.    Earline MayotteByrnett, Jeffrey W 08/10/2017, 12:15 PM

## 2017-08-21 ENCOUNTER — Ambulatory Visit: Payer: Medicare Other | Admitting: Student in an Organized Health Care Education/Training Program

## 2017-08-21 ENCOUNTER — Encounter: Payer: Self-pay | Admitting: Student in an Organized Health Care Education/Training Program

## 2017-08-21 ENCOUNTER — Ambulatory Visit
Admission: RE | Admit: 2017-08-21 | Discharge: 2017-08-21 | Disposition: A | Discharge status: Home / Self Care | Payer: Medicare Other | Source: Ambulatory Visit | Attending: Student in an Organized Health Care Education/Training Program | Admitting: Student in an Organized Health Care Education/Training Program

## 2017-08-21 ENCOUNTER — Other Ambulatory Visit: Payer: Self-pay

## 2017-08-21 VITALS — BP 140/57 | HR 67 | Temp 97.9°F | Resp 14 | Ht 63.0 in | Wt 165.0 lb

## 2017-08-21 DIAGNOSIS — Z7982 Long term (current) use of aspirin: Secondary | ICD-10-CM | POA: Diagnosis not present

## 2017-08-21 DIAGNOSIS — G894 Chronic pain syndrome: Secondary | ICD-10-CM | POA: Insufficient documentation

## 2017-08-21 DIAGNOSIS — M5116 Intervertebral disc disorders with radiculopathy, lumbar region: Secondary | ICD-10-CM | POA: Diagnosis not present

## 2017-08-21 DIAGNOSIS — M51369 Other intervertebral disc degeneration, lumbar region without mention of lumbar back pain or lower extremity pain: Secondary | ICD-10-CM

## 2017-08-21 DIAGNOSIS — M5136 Other intervertebral disc degeneration, lumbar region: Secondary | ICD-10-CM

## 2017-08-21 DIAGNOSIS — Z880 Allergy status to penicillin: Secondary | ICD-10-CM | POA: Diagnosis not present

## 2017-08-21 DIAGNOSIS — F419 Anxiety disorder, unspecified: Secondary | ICD-10-CM | POA: Insufficient documentation

## 2017-08-21 DIAGNOSIS — Z79890 Hormone replacement therapy (postmenopausal): Secondary | ICD-10-CM | POA: Insufficient documentation

## 2017-08-21 DIAGNOSIS — Z79899 Other long term (current) drug therapy: Secondary | ICD-10-CM | POA: Diagnosis not present

## 2017-08-21 DIAGNOSIS — M48061 Spinal stenosis, lumbar region without neurogenic claudication: Secondary | ICD-10-CM | POA: Diagnosis not present

## 2017-08-21 DIAGNOSIS — M9983 Other biomechanical lesions of lumbar region: Secondary | ICD-10-CM

## 2017-08-21 DIAGNOSIS — M25569 Pain in unspecified knee: Secondary | ICD-10-CM | POA: Insufficient documentation

## 2017-08-21 DIAGNOSIS — M549 Dorsalgia, unspecified: Secondary | ICD-10-CM | POA: Diagnosis present

## 2017-08-21 DIAGNOSIS — M5416 Radiculopathy, lumbar region: Secondary | ICD-10-CM | POA: Diagnosis not present

## 2017-08-21 MED ORDER — FENTANYL CITRATE (PF) 100 MCG/2ML IJ SOLN
INTRAMUSCULAR | Status: AC
  Filled 2017-08-21: qty 2

## 2017-08-21 MED ORDER — LIDOCAINE HCL (PF) 1 % IJ SOLN
10.0000 mL | Freq: Once | INTRAMUSCULAR | Status: AC
  Administered 2017-08-21: 5 mL

## 2017-08-21 MED ORDER — LACTATED RINGERS IV SOLN
1000.0000 mL | Freq: Once | INTRAVENOUS | Status: AC
  Administered 2017-08-21: 1000 mL via INTRAVENOUS

## 2017-08-21 MED ORDER — DEXAMETHASONE SODIUM PHOSPHATE 10 MG/ML IJ SOLN
INTRAMUSCULAR | Status: AC
  Filled 2017-08-21: qty 1

## 2017-08-21 MED ORDER — IOPAMIDOL (ISOVUE-M 200) INJECTION 41%
10.0000 mL | Freq: Once | INTRAMUSCULAR | Status: AC
Start: 2017-08-21 — End: 2017-08-21
  Administered 2017-08-21: 10 mL via EPIDURAL

## 2017-08-21 MED ORDER — FENTANYL CITRATE (PF) 100 MCG/2ML IJ SOLN
25.0000 ug | INTRAMUSCULAR | Status: DC | PRN
  Administered 2017-08-21: 25 ug via INTRAVENOUS

## 2017-08-21 MED ORDER — ROPIVACAINE HCL 2 MG/ML IJ SOLN
INTRAMUSCULAR | Status: AC
  Filled 2017-08-21: qty 10

## 2017-08-21 MED ORDER — SODIUM CHLORIDE 0.9 % IJ SOLN
INTRAMUSCULAR | Status: AC
  Filled 2017-08-21: qty 10

## 2017-08-21 MED ORDER — DEXAMETHASONE SODIUM PHOSPHATE 10 MG/ML IJ SOLN
10.0000 mg | Freq: Once | INTRAMUSCULAR | Status: AC
  Administered 2017-08-21: 10 mg

## 2017-08-21 MED ORDER — LIDOCAINE HCL (PF) 1 % IJ SOLN
INTRAMUSCULAR | Status: AC
  Filled 2017-08-21: qty 5

## 2017-08-21 MED ORDER — SODIUM CHLORIDE 0.9% FLUSH
2.0000 mL | Freq: Once | INTRAVENOUS | Status: AC
  Administered 2017-08-21: 10 mL

## 2017-08-21 MED ORDER — ROPIVACAINE HCL 2 MG/ML IJ SOLN
2.0000 mL | Freq: Once | INTRAMUSCULAR | Status: AC
  Administered 2017-08-21: 10 mL via EPIDURAL

## 2017-08-21 NOTE — Patient Instructions (Signed)

## 2017-08-21 NOTE — Progress Notes (Signed)
Patient's Name: Meagan Wilcox  MRN: [id number]  Referring Provider: Corky Downs, MD  DOB: 1930/12/26  PCP: Corky Downs, MD  DOS: 08/21/2017  Note by: Edward Jolly, MD  Service setting: Ambulatory outpatient  Specialty: Interventional Pain Management  Patient type: Established  Location: [facility] (AMB) Pain Management Facility  Visit type: Interventional Procedure   Primary Reason for Visit: Interventional Pain Management Treatment. CC: Back Pain and Knee Pain  Procedure:  Anesthesia, Analgesia, Anxiolysis:  Type: Therapeutic Inter-Laminar Epidural Steroid Injection #2  Region: Lumbar Level: L5-S1 Level. Laterality: Midline         Type: Local Anesthesia Local Anesthetic: Lidocaine 1% Route: Infiltration ([place]/IM) IV Access: Secured Sedation: Meaningful verbal contact was maintained at all times during the procedure  Indication(s): Analgesia and Anxiety   Indications: 1. Lumbar radiculopathy   2. Neuroforaminal stenosis of lumbar spine   3. Lumbar degenerative disc disease   4. Chronic pain syndrome    Pain Score: Pre-procedure: 1 /10 Post-procedure: 0-No pain/10  Pre-op Assessment:  Meagan Wilcox is a 84 y.o. (year old), female patient, seen today for interventional treatment. She  has a past surgical history that includes Appendectomy (1939); Colonoscopy (2010); cataract surgery  (Bilateral, 2009); left breast biopsy  (2009); Breast surgery (Left, 2013); Breast surgery (Left, May 2010); Breast biopsy (Left, 02-24-14); Breast biopsy (Left, 02/11/2016); Breast excisional biopsy (Left, 2014); and Breast biopsy (Left, 2013). Meagan Wilcox has a current medication list which includes the following prescription(s): amitriptyline, aspirin, calcium carbonate-vit d-min, vitamin d3, diazepam, levothyroxine, losartan-hydrochlorothiazide, omeprazole, pentosan polysulfate, and simvastatin, and the following Facility-Administered Medications: fentanyl. Her primarily concern today is the Back Pain and Knee  Pain  Initial Vital Signs: Blood pressure (!) 168/59, pulse 64, temperature 98.3 F (36.8 C), temperature source Oral, resp. rate 18, height 5\' 3"  (1.6 m), weight 170 lb (77.1 kg), SpO2 100 %. BMI: Estimated body mass index is 29.23 kg/m as calculated from the following:   Height as of this encounter: 5\' 3"  (1.6 m).   Weight as of this encounter: 165 lb (74.8 kg).  Risk Assessment: Allergies: Reviewed. She is allergic to amoxicillin and penicillins.  Allergy Precautions: None required Coagulopathies: Reviewed. None identified.  Blood-thinner therapy: None at this time Active Infection(s): Reviewed. None identified. Meagan Wilcox is afebrile  Site Confirmation: Meagan Wilcox was asked to confirm the procedure and laterality before marking the site Procedure checklist: Completed Consent: Before the procedure and under the influence of no sedative(s), amnesic(s), or anxiolytics, the patient was informed of the treatment options, risks and possible complications. To fulfill our ethical and legal obligations, as recommended by the American Medical Association's Code of Ethics, I have informed the patient of my clinical impression; the nature and purpose of the treatment or procedure; the risks, benefits, and possible complications of the intervention; the alternatives, including doing nothing; the risk(s) and benefit(s) of the alternative treatment(s) or procedure(s); and the risk(s) and benefit(s) of doing nothing. The patient was provided information about the general risks and possible complications associated with the procedure. These may include, but are not limited to: failure to achieve desired goals, infection, bleeding, organ or nerve damage, allergic reactions, paralysis, and death. In addition, the patient was informed of those risks and complications associated to Spine-related procedures, such as failure to decrease pain; infection (i.e.: Meningitis, epidural or intraspinal abscess); bleeding  (i.e.: epidural hematoma, subarachnoid hemorrhage, or any other type of intraspinal or peri-dural bleeding); organ or nerve damage (i.e.: Any type of peripheral nerve, nerve root,  or spinal cord injury) with subsequent damage to sensory, motor, and/or autonomic systems, resulting in permanent pain, numbness, and/or weakness of one or several areas of the body; allergic reactions; (i.e.: anaphylactic reaction); and/or death. Furthermore, the patient was informed of those risks and complications associated with the medications. These include, but are not limited to: allergic reactions (i.e.: anaphylactic or anaphylactoid reaction(s)); adrenal axis suppression; blood sugar elevation that in diabetics may result in ketoacidosis or comma; water retention that in patients with history of congestive heart failure may result in shortness of breath, pulmonary edema, and decompensation with resultant heart failure; weight gain; swelling or edema; medication-induced neural toxicity; particulate matter embolism and blood vessel occlusion with resultant organ, and/or nervous system infarction; and/or aseptic necrosis of one or more joints. Finally, the patient was informed that Medicine is not an exact science; therefore, there is also the possibility of unforeseen or unpredictable risks and/or possible complications that may result in a catastrophic outcome. The patient indicated having understood very clearly. We have given the patient no guarantees and we have made no promises. Enough time was given to the patient to ask questions, all of which were answered to the patient's satisfaction. [name] has indicated that she wanted to continue with the procedure. Attestation: I, the ordering provider, attest that I have discussed with the patient the benefits, risks, side-effects, alternatives, likelihood of achieving goals, and potential problems during recovery for the procedure that I have provided informed consent. Date:  08/21/2017; Time: 9:57 AM  Pre-Procedure Preparation:  Monitoring: As per clinic protocol. Respiration, ETCO2, SpO2, BP, heart rate and rhythm monitor placed and checked for adequate function Safety Precautions: Patient was assessed for positional comfort and pressure points before starting the procedure. Time-out: I initiated and conducted the "Time-out" before starting the procedure, as per protocol. The patient was asked to participate by confirming the accuracy of the "Time Out" information. Verification of the correct person, site, and procedure were performed and confirmed by me, the nursing staff, and the patient. "Time-out" conducted as per Joint Commission's Universal Protocol (UP.01.01.01). "Time-out" Date & Time: 08/21/2017; 1042 hrs.  Description of Procedure Process:   Position: Prone with head of the table was raised to facilitate breathing. Target Area: The interlaminar space, initially targeting the lower laminar border of the superior vertebral body. Approach: Paramedial approach. Area Prepped: Entire Posterior Lumbar Region Prepping solution: ChloraPrep (2% chlorhexidine gluconate and 70% isopropyl alcohol) Safety Precautions: Aspiration looking for blood return was conducted prior to all injections. At no point did we inject any substances, as a needle was being advanced. No attempts were made at seeking any paresthesias. Safe injection practices and needle disposal techniques used. Medications properly checked for expiration dates. SDV (single dose vial) medications used. Description of the Procedure: Protocol guidelines were followed. The procedure needle was introduced through the skin, ipsilateral to the reported pain, and advanced to the target area. Bone was contacted and the needle walked caudad, until the lamina was cleared. The epidural space was identified using "loss-of-resistance technique" with 2-3 ml of PF-NaCl (0.9% NSS), in a 5cc LOR glass syringe. Vitals:   08/21/17  1048 08/21/17 1055 08/21/17 1105 08/21/17 1116  BP: (!) 152/82 (!) 159/55 (!) 148/55 (!) 140/57  Pulse: 67     Resp: 15 (!) 8 13 14   Temp:  97.9 F (36.6 C)    TempSrc:  Temporal    SpO2: 100% 95% 95% 98%  Weight:      Height:  Start Time: 1042 hrs. End Time: 1048 hrs. Materials:  Needle(s) Type: Epidural needle Gauge: 17G Length: 3.5-in Medication(s): We administered lactated ringers, fentaNYL, iopamidol, ropivacaine (PF) 2 mg/mL (0.2%), sodium chloride flush, dexamethasone, and lidocaine (PF). Please see chart orders for dosing details. 5 cc of normal saline, 2 cc of 0.2% ropivacaine, 1 cc of Decadron 10 mg/cc= 8 cc total Imaging Guidance (Spinal):  Type of Imaging Technique: Fluoroscopy Guidance (Spinal) Indication(s): Assistance in needle guidance and placement for procedures requiring needle placement in or near specific anatomical locations not easily accessible without such assistance. Exposure Time: Please see nurses notes. Contrast: Before injecting any contrast, we confirmed that the patient did not have an allergy to iodine, shellfish, or radiological contrast. Once satisfactory needle placement was completed at the desired level, radiological contrast was injected. Contrast injected under live fluoroscopy. No contrast complications. See chart for type and volume of contrast used. Fluoroscopic Guidance: I was personally present during the use of fluoroscopy. "Tunnel Vision Technique" used to obtain the best possible view of the target area. Parallax error corrected before commencing the procedure. "Direction-depth-direction" technique used to introduce the needle under continuous pulsed fluoroscopy. Once target was reached, antero-posterior, oblique, and lateral fluoroscopic projection used confirm needle placement in all planes. Images permanently stored in EMR. Interpretation: I personally interpreted the imaging intraoperatively. Adequate needle placement confirmed in  multiple planes. Appropriate spread of contrast into desired area was observed. No evidence of afferent or efferent intravascular uptake. No intrathecal or subarachnoid spread observed. Permanent images saved into the patient's record.  Antibiotic Prophylaxis:  Indication(s): None identified Antibiotic given: None  Post-operative Assessment:  EBL: None Complications: No immediate post-treatment complications observed by team, or reported by patient. Note: The patient tolerated the entire procedure well. A repeat set of vitals were taken after the procedure and the patient was kept under observation following institutional policy, for this type of procedure. Post-procedural neurological assessment was performed, showing return to baseline, prior to discharge. The patient was provided with post-procedure discharge instructions, including a section on how to identify potential problems. Should any problems arise concerning this procedure, the patient was given instructions to immediately contact us, at any time, without hesitation. In any case, we plan to contact the patient by telephone for a follow-up status report regarding this interventional procedure. Comments:  No additional relevant information.  Plan of Care  5 out of 5 strength left lower extremity: Plantar flexion, dorsiflexion, knee flexion, knee extension on discharge.  Imaging Orders     DG C-Arm 1-60 Min-No Report Procedure Orders    No procedure(s) ordered today    Medications ordered for procedure: Meds ordered this encounter  Medications  . lactated ringers infusion 1,000 mL  . fentaNYL (SUBLIMAZE) injection 25-100 mcg    Make sure Narcan is available in the pyxis when using this medication. In the event of respiratory depression (RR< 8/min): Titrate NARCAN (naloxone) in increments of 0.1 to 0.2 mg IV at 2-3 minute intervals, until desired degree of reversal.  . iopamidol (ISOVUE-M) 41 % intrathecal injection 10 mL  .  ropivacaine (PF) 2 mg/mL (0.2%) (NAROPIN) injection 2 mL  . sodium chloride flush (NS) 0.9 % injection 2 mL  . dexamethasone (DECADRON) injection 10 mg  . lidocaine (PF) (XYLOCAINE) 1 % injection 10 mL   Medications administered: We administered lactated ringers, fentaNYL, iopamidol, ropivacaine (PF) 2 mg/mL (0.2%), sodium chloride flush, dexamethasone, and lidocaine (PF).  See the medical record for exact dosing, route, and time of  administration.  This SmartLink is deprecated. Use AVSMEDLIST instead to display the medication list for a patient. Disposition: Discharge home  Discharge Date & Time: 08/21/2017; 1116 hrs.   Physician-requested Follow-up: Return in about 4 weeks (around 09/18/2017) for Post Procedure Evaluation. Future Appointments  Date Time Provider Department Center  09/19/2017 10:00 AM Edward Jolly, MD [facility] None   Primary Care Physician: Corky Downs, MD Location: [facility] Note by: Edward Jolly, MD Date: 08/21/2017; Time: 11:54 AM  Disclaimer:  Medicine is not an exact science. The only guarantee in medicine is that nothing is guaranteed. It is important to note that the decision to proceed with this intervention was based on the information collected from the patient. The Data and conclusions were drawn from the patient's questionnaire, the interview, and the physical examination. Because the information was provided in large part by the patient, it cannot be guaranteed that it has not been purposely or unconsciously manipulated. Every effort has been made to obtain as much relevant data as possible for this evaluation. It is important to note that the conclusions that lead to this procedure are derived in large part from the available data. Always take into account that the treatment will also be dependent on availability of resources and existing treatment guidelines, considered by other Pain Management Practitioners as being common knowledge  and practice, at the time of the intervention. For Medico-Legal purposes, it is also important to point out that variation in procedural techniques and pharmacological choices are the acceptable norm. The indications, contraindications, technique, and results of the above procedure should only be interpreted and judged by a Board-Certified Interventional Pain Specialist with extensive familiarity and expertise in the same exact procedure and technique.

## 2017-08-22 ENCOUNTER — Telehealth: Payer: Self-pay | Admitting: *Deleted

## 2017-08-22 NOTE — Telephone Encounter (Signed)
Attempted to call for post procedure follow-up. No answer. 

## 2017-09-19 ENCOUNTER — Other Ambulatory Visit: Payer: Self-pay

## 2017-09-19 ENCOUNTER — Ambulatory Visit
Payer: Medicare Other | Attending: Student in an Organized Health Care Education/Training Program | Admitting: Student in an Organized Health Care Education/Training Program

## 2017-09-19 ENCOUNTER — Encounter: Payer: Self-pay | Admitting: Student in an Organized Health Care Education/Training Program

## 2017-09-19 VITALS — BP 146/52 | HR 61 | Temp 98.0°F | Resp 16 | Ht 63.0 in | Wt 166.0 lb

## 2017-09-19 DIAGNOSIS — M5116 Intervertebral disc disorders with radiculopathy, lumbar region: Secondary | ICD-10-CM | POA: Diagnosis not present

## 2017-09-19 DIAGNOSIS — M25561 Pain in right knee: Secondary | ICD-10-CM | POA: Diagnosis present

## 2017-09-19 DIAGNOSIS — E039 Hypothyroidism, unspecified: Secondary | ICD-10-CM | POA: Insufficient documentation

## 2017-09-19 DIAGNOSIS — Z8249 Family history of ischemic heart disease and other diseases of the circulatory system: Secondary | ICD-10-CM | POA: Diagnosis not present

## 2017-09-19 DIAGNOSIS — Z7982 Long term (current) use of aspirin: Secondary | ICD-10-CM | POA: Insufficient documentation

## 2017-09-19 DIAGNOSIS — E785 Hyperlipidemia, unspecified: Secondary | ICD-10-CM | POA: Diagnosis not present

## 2017-09-19 DIAGNOSIS — Z9889 Other specified postprocedural states: Secondary | ICD-10-CM | POA: Insufficient documentation

## 2017-09-19 DIAGNOSIS — M9983 Other biomechanical lesions of lumbar region: Secondary | ICD-10-CM

## 2017-09-19 DIAGNOSIS — M545 Low back pain: Secondary | ICD-10-CM | POA: Diagnosis present

## 2017-09-19 DIAGNOSIS — I10 Essential (primary) hypertension: Secondary | ICD-10-CM | POA: Insufficient documentation

## 2017-09-19 DIAGNOSIS — M5136 Other intervertebral disc degeneration, lumbar region: Secondary | ICD-10-CM

## 2017-09-19 DIAGNOSIS — Z79899 Other long term (current) drug therapy: Secondary | ICD-10-CM | POA: Insufficient documentation

## 2017-09-19 DIAGNOSIS — G894 Chronic pain syndrome: Secondary | ICD-10-CM

## 2017-09-19 DIAGNOSIS — M51369 Other intervertebral disc degeneration, lumbar region without mention of lumbar back pain or lower extremity pain: Secondary | ICD-10-CM

## 2017-09-19 DIAGNOSIS — M170 Bilateral primary osteoarthritis of knee: Secondary | ICD-10-CM

## 2017-09-19 DIAGNOSIS — Z880 Allergy status to penicillin: Secondary | ICD-10-CM | POA: Diagnosis not present

## 2017-09-19 DIAGNOSIS — Z833 Family history of diabetes mellitus: Secondary | ICD-10-CM | POA: Diagnosis not present

## 2017-09-19 DIAGNOSIS — M5416 Radiculopathy, lumbar region: Secondary | ICD-10-CM

## 2017-09-19 DIAGNOSIS — Z823 Family history of stroke: Secondary | ICD-10-CM | POA: Diagnosis not present

## 2017-09-19 DIAGNOSIS — M48061 Spinal stenosis, lumbar region without neurogenic claudication: Secondary | ICD-10-CM

## 2017-09-19 DIAGNOSIS — M25562 Pain in left knee: Secondary | ICD-10-CM | POA: Diagnosis present

## 2017-09-19 DIAGNOSIS — K219 Gastro-esophageal reflux disease without esophagitis: Secondary | ICD-10-CM | POA: Insufficient documentation

## 2017-09-19 NOTE — Progress Notes (Signed)
Patient's Name: Meagan Wilcox  MRN: [id number]  Referring Provider: Corky Downs, MD  DOB: [birth year]  PCP: Corky Downs, MD  DOS: 09/19/2017  Note by: Edward Jolly, MD  Service setting: Ambulatory outpatient  Specialty: Interventional Pain Management  Location: [facility] (AMB) Pain Management Facility    Patient type: Established   Primary Reason(s) for Visit: Encounter for post-procedure evaluation of chronic illness with mild to moderate exacerbation CC: Back Pain (lower) and Knee Pain (bilaterally)  HPI  Meagan Wilcox is a 84 y.o. year old, female patient, who comes today for a post-procedure evaluation. She has Hypertension; Atypical ductal hyperplasia of left breast; Lumbar radiculopathy; Neuroforaminal stenosis of lumbar spine; Lumbar degenerative disc disease; and Chronic pain syndrome on their problem list. Her primarily concern today is the Back Pain (lower) and Knee Pain (bilaterally)  Pain Assessment: Location: Lower Back Radiating: denies pain at the moment Onset: More than a month ago Duration:   Quality:   Severity: 0-No pain/10 (self-reported pain score)  Note: Reported level is compatible with observation.                         When using our objective Pain Scale, levels between 6 and 10/10 are said to belong in an emergency room, as it progressively worsens from a 6/10, described as severely limiting, requiring emergency care not usually available at an outpatient pain management facility. At a 6/10 level, communication becomes difficult and requires great effort. Assistance to reach the emergency department may be required. Facial flushing and profuse sweating along with potentially dangerous increases in heart rate and blood pressure will be evident. Effect on ADL:   Timing:   Modifying factors: Aleve, procedures; most days pt states she does not take anything for pain  Meagan Wilcox comes in today for post-procedure evaluation after the treatment done on 08/21/2017.  Further details  on both, my assessment(s), as well as the proposed treatment plan, please see below.  Post-Procedure Assessment  08/21/2017 Procedure: L5-S1 ESI #2 Pre-procedure pain score:  1/10 Post-procedure pain score: 0/10         Influential Factors: BMI: 29.41 kg/m Intra-procedural challenges: None observed.         Assessment challenges: None detected.              Reported side-effects: None.        Post-procedural adverse reactions or complications: None reported         Sedation: Please see nurses note. When no sedatives are used, the analgesic levels obtained are directly associated to the effectiveness of the local anesthetics. However, when sedation is provided, the level of analgesia obtained during the initial 1 hour following the intervention, is believed to be the result of a combination of factors. These factors may include, but are not limited to: 1. The effectiveness of the local anesthetics used. 2. The effects of the analgesic(s) and/or anxiolytic(s) used. 3. The degree of discomfort experienced by the patient at the time of the procedure. 4. The patients ability and reliability in recalling and recording the events. 5. The presence and influence of possible secondary gains and/or psychosocial factors. Reported result: Relief experienced during the 1st hour after the procedure: 80 % (Ultra-Short Term Relief)            Interpretative annotation: Clinically appropriate result. Analgesia during this period is likely to be Local Anesthetic and/or IV Sedative (Analgesic/Anxiolytic) related.          Effects  of local anesthetic: The analgesic effects attained during this period are directly associated to the localized infiltration of local anesthetics and therefore cary significant diagnostic value as to the etiological location, or anatomical origin, of the pain. Expected duration of relief is directly dependent on the pharmacodynamics of the local anesthetic used. Long-acting (4-6 hours)  anesthetics used.  Reported result: Relief during the next 4 to 6 hour after the procedure: 90 % (Short-Term Relief)            Interpretative annotation: Clinically appropriate result. Analgesia during this period is likely to be Local Anesthetic-related.          Long-term benefit: Defined as the period of time past the expected duration of local anesthetics (1 hour for short-acting and 4-6 hours for long-acting). With the possible exception of prolonged sympathetic blockade from the local anesthetics, benefits during this period are typically attributed to, or associated with, other factors such as analgesic sensory neuropraxia, antiinflammatory effects, or beneficial biochemical changes provided by agents other than the local anesthetics.  Reported result: Extended relief following procedure: 95 % (Long-Term Relief)            Interpretative annotation: Clinically appropriate result. Good relief. Therapeutic success. Inflammation plays a part in the etiology to the pain.          Current benefits: Defined as reported results that persistent at this point in time.   Analgesia: 75-100 % Meagan Wilcox reports improvement of axial symptoms. Function: Meagan Wilcox reports improvement in function ROM: Meagan Wilcox reports improvement in ROM Interpretative annotation: Ongoing benefit. Therapeutic benefit observed. Effective therapeutic approach.          Interpretation: Results would suggest a successful therapeutic intervention.                  Plan:  Please see "Plan of Care" for details.         Note: Lab results reviewed.    Meds   Current Outpatient Medications:  .  amitriptyline (ELAVIL) 25 MG tablet, Take 25 mg by mouth at bedtime., Disp: , Rfl:  .  aspirin 81 MG chewable tablet, Chew 81 mg by mouth daily., Disp: , Rfl:  .  Calcium Carbonate-Vit D-Min (CALCIUM 1200 PO), Take 1 tablet by mouth daily., Disp: , Rfl:  .  Cholecalciferol (VITAMIN D3) 1000 units CAPS, Take 2,000 Units by mouth at  bedtime. , Disp: , Rfl:  .  diazepam (VALIUM) 5 MG tablet, Take 5 mg as needed by mouth for anxiety., Disp: , Rfl:  .  levothyroxine (SYNTHROID, LEVOTHROID) 100 MCG tablet, Take 100 mcg by mouth daily., Disp: , Rfl:  .  losartan-hydrochlorothiazide (HYZAAR) 100-12.5 MG tablet, Take 1 tablet daily by mouth., Disp: , Rfl:  .  omeprazole (PRILOSEC) 20 MG capsule, Take 1 capsule by mouth daily., Disp: , Rfl:  .  pentosan polysulfate (ELMIRON) 100 MG capsule, Take 100 mg by mouth 2 (two) times daily. , Disp: , Rfl:  .  simvastatin (ZOCOR) 20 MG tablet, Take 20 mg by mouth every evening., Disp: , Rfl:   ROS  Constitutional: Denies any fever or chills Gastrointestinal: No reported hemesis, hematochezia, vomiting, or acute GI distress Musculoskeletal: Denies any acute onset joint swelling, redness, loss of ROM, or weakness Neurological: No reported episodes of acute onset apraxia, aphasia, dysarthria, agnosia, amnesia, paralysis, loss of coordination, or loss of consciousness  Allergies  Meagan Wilcox is allergic to amoxicillin and penicillins.  PFSH  Drug: Meagan Wilcox  reports  that she does not use drugs. Alcohol:  reports that she does not drink alcohol. Tobacco:  reports that  has never smoked. she has never used smokeless tobacco. Medical:  has a past medical history of Abnormal mammogram, unspecified (2011), Arthritis (2002), GERD (gastroesophageal reflux disease), Heart murmur (1985), Hyperlipidemia, Hypertension (2005), and Hypothyroidism. Surgical: Meagan Wilcox  has a past surgical history that includes Appendectomy (1939); Colonoscopy (2010); cataract surgery  (Bilateral, 2009); left breast biopsy  (2009); Breast surgery (Left, 2013); Breast surgery (Left, May 2010); Breast biopsy (Left, 02-24-14); Breast biopsy (Left, 02/11/2016); Breast excisional biopsy (Left, 2014); and Breast biopsy (Left, 2013). Family: family history includes Arthritis in her father; Diabetes in her father; Heart disease in her  father and mother; Stroke in her father.  Constitutional Exam  General appearance: Well nourished, well developed, and well hydrated. In no apparent acute distress Vitals:   09/19/17 1017  BP: (!) 146/52  Pulse: 61  Resp: 16  Temp: 98 F (36.7 C)  TempSrc: Oral  SpO2: 100%  Weight: 166 lb (75.3 kg)  Height: 5\' 3"  (1.6 m)   BMI Assessment: Estimated body mass index is 29.41 kg/m as calculated from the following:   Height as of this encounter: 5\' 3"  (1.6 m).   Weight as of this encounter: 166 lb (75.3 kg).  BMI interpretation table: BMI level Category Range association with higher incidence of chronic pain  <18 kg/m2 Underweight   18.5-24.9 kg/m2 Ideal body weight   25-29.9 kg/m2 Overweight Increased incidence by 20%  30-34.9 kg/m2 Obese (Class I) Increased incidence by 68%  35-39.9 kg/m2 Severe obesity (Class II) Increased incidence by 136%  >40 kg/m2 Extreme obesity (Class III) Increased incidence by 254%   BMI Readings from Last 4 Encounters:  09/19/17 29.41 kg/m  08/21/17 29.23 kg/m  08/09/17 29.76 kg/m  08/01/17 29.58 kg/m   Wt Readings from Last 4 Encounters:  09/19/17 166 lb (75.3 kg)  08/21/17 165 lb (74.8 kg)  08/09/17 168 lb (76.2 kg)  08/01/17 167 lb (75.8 kg)  Psych/Mental status: Alert, oriented x 3 (person, place, & time)       Eyes: PERLA Respiratory: No evidence of acute respiratory distress  Cervical Spine Area Exam  Skin & Axial Inspection: No masses, redness, edema, swelling, or associated skin lesions Alignment: Symmetrical Functional ROM: Unrestricted ROM      Stability: No instability detected Muscle Tone/Strength: Functionally intact. No obvious neuro-muscular anomalies detected. Sensory (Neurological): Unimpaired Palpation: No palpable anomalies              Upper Extremity (UE) Exam    Side: Right upper extremity  Side: Left upper extremity  Skin & Extremity Inspection: Skin color, temperature, and hair growth are WNL. No peripheral  edema or cyanosis. No masses, redness, swelling, asymmetry, or associated skin lesions. No contractures.  Skin & Extremity Inspection: Skin color, temperature, and hair growth are WNL. No peripheral edema or cyanosis. No masses, redness, swelling, asymmetry, or associated skin lesions. No contractures.  Functional ROM: Unrestricted ROM          Functional ROM: Unrestricted ROM          Muscle Tone/Strength: Functionally intact. No obvious neuro-muscular anomalies detected.  Muscle Tone/Strength: Functionally intact. No obvious neuro-muscular anomalies detected.  Sensory (Neurological): Unimpaired          Sensory (Neurological): Unimpaired          Palpation: No palpable anomalies  Palpation: No palpable anomalies              Specialized Test(s): Deferred         Specialized Test(s): Deferred          Thoracic Spine Area Exam  Skin & Axial Inspection: No masses, redness, or swelling Alignment: Symmetrical Functional ROM: Unrestricted ROM Stability: No instability detected Muscle Tone/Strength: Functionally intact. No obvious neuro-muscular anomalies detected. Sensory (Neurological): Unimpaired Muscle strength & Tone: No palpable anomalies  Lumbar Spine Area Exam  Skin & Axial Inspection: No masses, redness, or swelling Alignment: Symmetrical Functional ROM: Improved after treatment, bilaterally Stability: No instability detected Muscle Tone/Strength: Functionally intact. No obvious neuro-muscular anomalies detected. Sensory (Neurological): Unimpaired Palpation: No palpable anomalies       Provocative Tests: Lumbar Hyperextension and rotation test: Improved after treatment       Lumbar Lateral bending test: Improved after treatment       Patrick's Maneuver: evaluation deferred today                    Gait & Posture Assessment  Ambulation: Unassisted Gait: Relatively normal for age and body habitus Posture: WNL   Lower Extremity Exam    Side: Right lower extremity   Side: Left lower extremity  Skin & Extremity Inspection: Skin color, temperature, and hair growth are WNL. No peripheral edema or cyanosis. No masses, redness, swelling, asymmetry, or associated skin lesions. No contractures.  Skin & Extremity Inspection: Skin color, temperature, and hair growth are WNL. No peripheral edema or cyanosis. No masses, redness, swelling, asymmetry, or associated skin lesions. No contractures.  Functional ROM: Unrestricted ROM          Functional ROM: Unrestricted ROM          Muscle Tone/Strength: Functionally intact. No obvious neuro-muscular anomalies detected.  Muscle Tone/Strength: Functionally intact. No obvious neuro-muscular anomalies detected.  Sensory (Neurological): Unimpaired  Sensory (Neurological): Unimpaired  Palpation: No palpable anomalies  Palpation: No palpable anomalies   Assessment  Primary Diagnosis & Pertinent Problem List: The primary encounter diagnosis was Lumbar radiculopathy. Diagnoses of Neuroforaminal stenosis of lumbar spine, Lumbar degenerative disc disease, Chronic pain syndrome, and Primary osteoarthritis of both knees were also pertinent to this visit.  Status Diagnosis  Controlled Controlled Controlled 1. Lumbar radiculopathy   2. Neuroforaminal stenosis of lumbar spine   3. Lumbar degenerative disc disease   4. Chronic pain syndrome   5. Primary osteoarthritis of both knees     General Recommendations: The pain condition that the patient suffers from is best treated with a multidisciplinary approach that involves an increase in physical activity to prevent de-conditioning and worsening of the pain cycle, as well as psychological counseling (formal and/or informal) to address the co-morbid psychological affects of pain. Treatment will often involve judicious use of pain medications and interventional procedures to decrease the pain, allowing the patient to participate in the physical activity that will ultimately produce long-lasting  pain reductions. The goal of the multidisciplinary approach is to return the patient to a higher level of overall function and to restore their ability to perform activities of daily living.  84 year old female who presents status post L5-S1 epidural steroid injection #2 on August 21, 2017.  Patient notes significant improvement in her axial low back pain and radiating symptoms that are very debilitating for her in the past.  She states that she is able to ambulate for an extended period of time without having as much pain is able  to perform activities of daily living without as much discomfort.  I have encouraged the patient to continue with her daily exercises.  Should her symptoms return, she is welcome to come back and see me for discussion about a repeat epidural steroid injection.  Provider-requested follow-up: No Follow-up on file. Time Note: Greater than 50% of the 15 minute(s) of face-to-face time spent with Meagan Wilcox, was spent in counseling/coordination of care regarding: the treatment plan, treatment alternatives and the results, interpretation and significance of  her recent diagnostic interventional treatment(s). No future appointments.  Primary Care Physician: Corky Downs, MD Location: [facility] Note by: Edward Jolly, M.D Date: 09/19/2017; Time: 10:45 AM  There are no Patient Instructions on file for this visit.

## 2017-09-19 NOTE — Progress Notes (Signed)
Safety precautions to be maintained throughout the outpatient stay will include: orient to surroundings, keep bed in low position, maintain call bell within reach at all times, provide assistance with transfer out of bed and ambulation.  

## 2018-02-16 IMAGING — MG MM DIGITAL SCREENING BILAT W/ CAD
4 series · 4 of 4 positions shown · non-contrast
Comparison: Previous exam(s).

CLINICAL DATA: Screening.

EXAM:
DIGITAL SCREENING BILATERAL MAMMOGRAM WITH CAD

[R MLO]
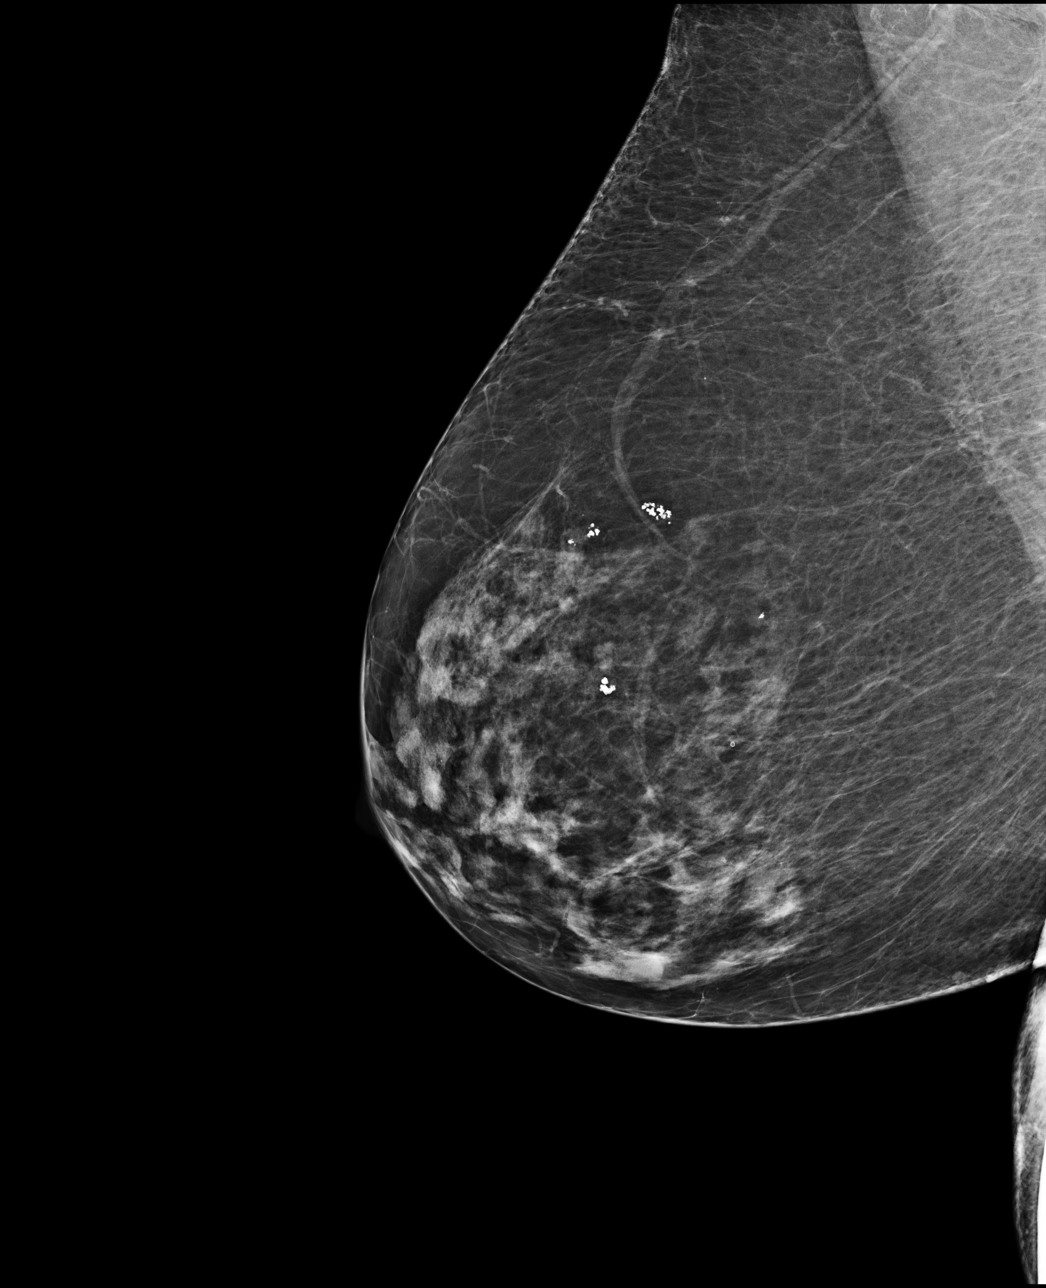

[R CC]
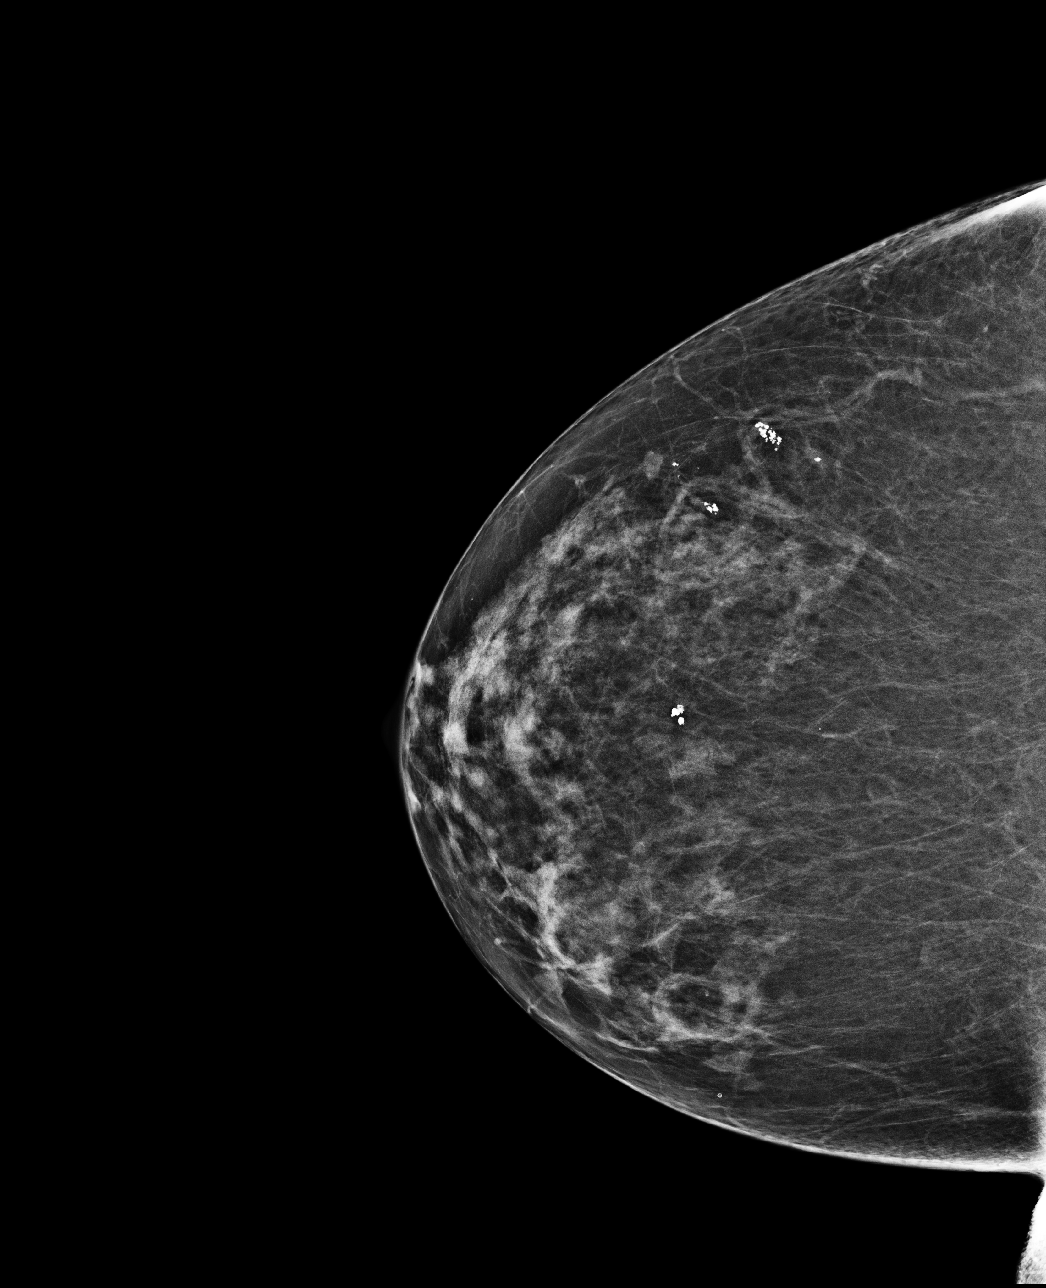

[L MLO]
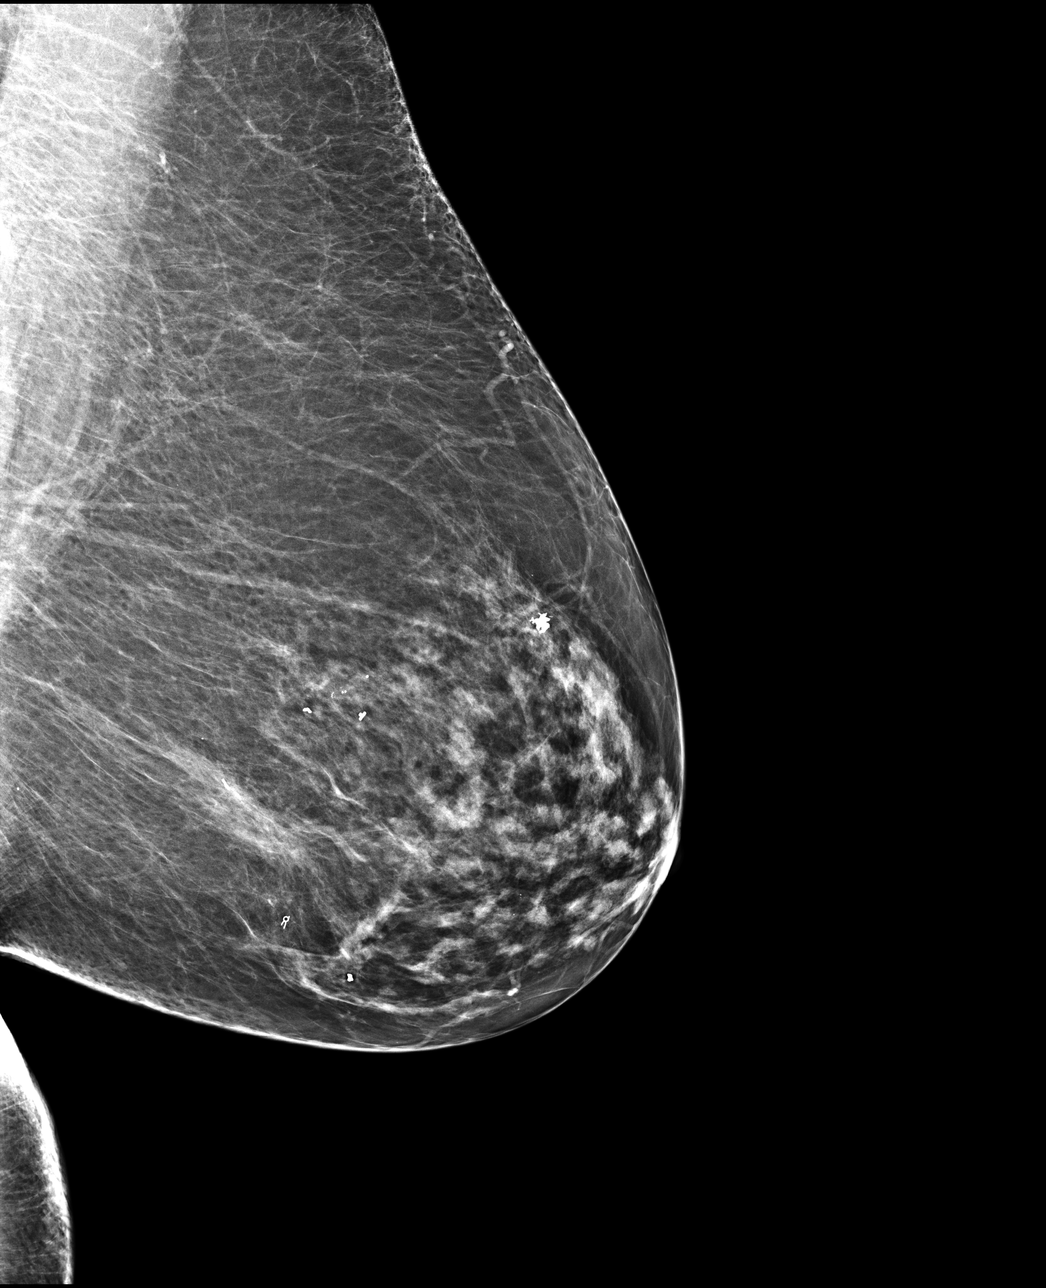

[L CC]
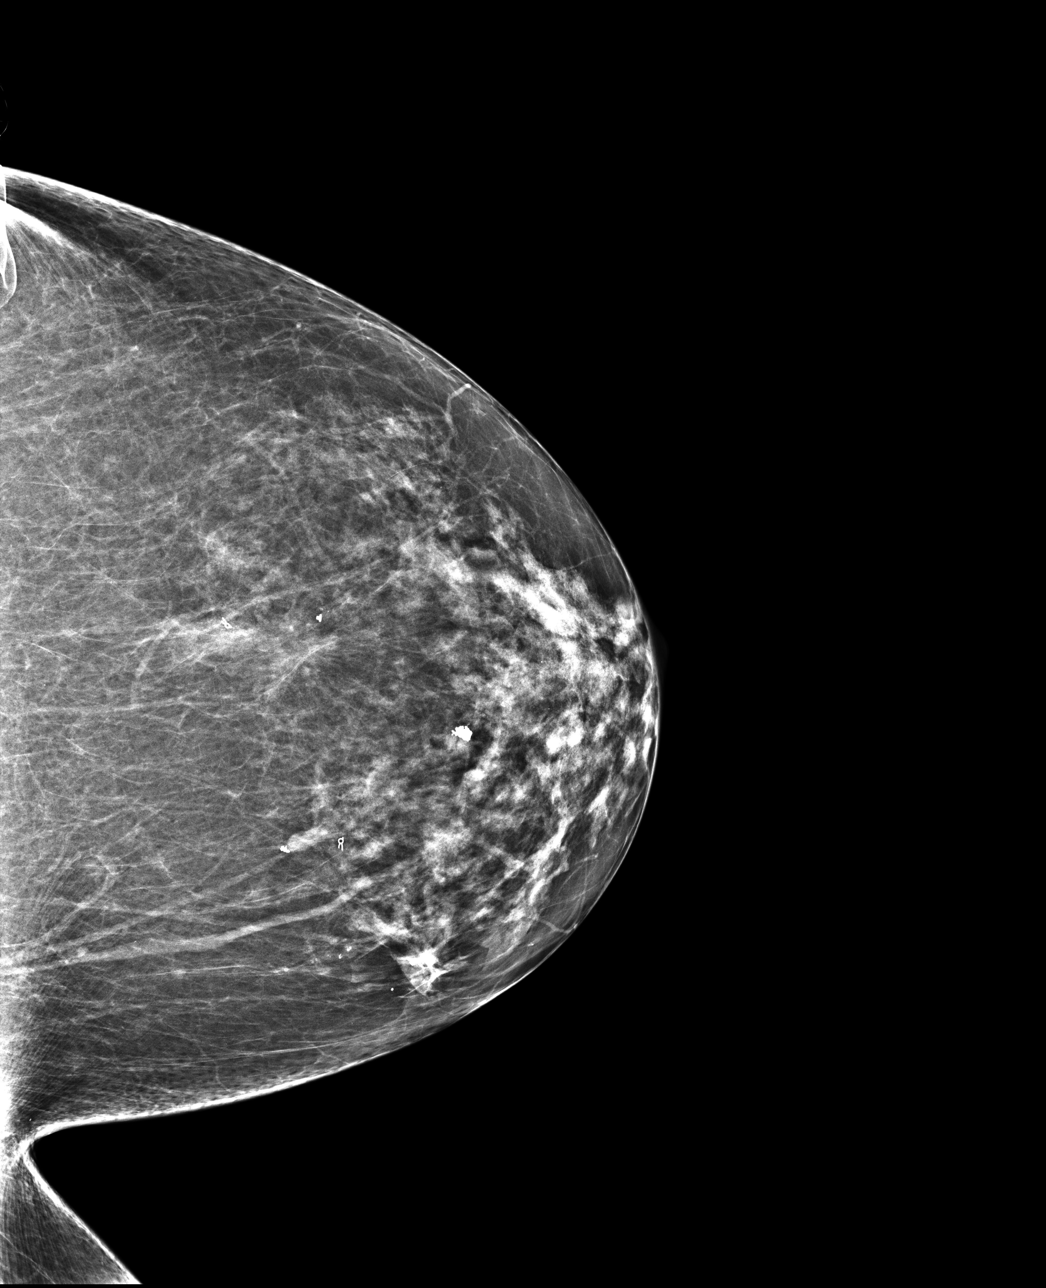

[4 of 4 positions shown; findings below may reference images not displayed]

ACR Breast Density Category b: There are scattered areas of
fibroglandular density.
FINDINGS: There are no findings suspicious for malignancy. Images were
processed with CAD.
IMPRESSION: No mammographic evidence of malignancy. A result letter of this
screening mammogram will be mailed directly to the patient.

RECOMMENDATION:
Screening mammogram in one year. (Code:AS-G-LCT)

BI-RADS CATEGORY  1: Negative.

## 2018-06-04 ENCOUNTER — Other Ambulatory Visit: Payer: Self-pay

## 2018-06-04 DIAGNOSIS — Z1231 Encounter for screening mammogram for malignant neoplasm of breast: Secondary | ICD-10-CM

## 2018-06-04 NOTE — Progress Notes (Signed)
cr

## 2018-06-18 ENCOUNTER — Ambulatory Visit: Payer: Medicare Other | Admitting: Student in an Organized Health Care Education/Training Program

## 2018-07-03 ENCOUNTER — Telehealth: Payer: Self-pay | Admitting: Student in an Organized Health Care Education/Training Program

## 2018-07-03 NOTE — Telephone Encounter (Signed)
Called patient back and she states that she spoke with someone earlier and they cleared it up for her.  Denies further questions.

## 2018-07-03 NOTE — Telephone Encounter (Signed)
Wants to speak with someone about her procedure scheduled Wed w/ Dr. Cherylann RatelLateef

## 2018-07-04 ENCOUNTER — Ambulatory Visit (HOSPITAL_BASED_OUTPATIENT_CLINIC_OR_DEPARTMENT_OTHER): Payer: Medicare Other | Admitting: Student in an Organized Health Care Education/Training Program

## 2018-07-04 ENCOUNTER — Encounter: Payer: Self-pay | Admitting: Student in an Organized Health Care Education/Training Program

## 2018-07-04 ENCOUNTER — Ambulatory Visit
Admission: RE | Admit: 2018-07-04 | Discharge: 2018-07-04 | Disposition: A | Discharge status: Home / Self Care | Payer: Medicare Other | Source: Ambulatory Visit | Attending: Student in an Organized Health Care Education/Training Program | Admitting: Student in an Organized Health Care Education/Training Program

## 2018-07-04 ENCOUNTER — Other Ambulatory Visit: Payer: Self-pay

## 2018-07-04 VITALS — BP 159/72 | HR 61 | Temp 97.2°F | Resp 14 | Ht 63.0 in | Wt 161.0 lb

## 2018-07-04 DIAGNOSIS — F419 Anxiety disorder, unspecified: Secondary | ICD-10-CM | POA: Insufficient documentation

## 2018-07-04 DIAGNOSIS — Z79899 Other long term (current) drug therapy: Secondary | ICD-10-CM | POA: Diagnosis not present

## 2018-07-04 DIAGNOSIS — Z79890 Hormone replacement therapy (postmenopausal): Secondary | ICD-10-CM | POA: Diagnosis not present

## 2018-07-04 DIAGNOSIS — Z880 Allergy status to penicillin: Secondary | ICD-10-CM | POA: Diagnosis not present

## 2018-07-04 DIAGNOSIS — M5416 Radiculopathy, lumbar region: Secondary | ICD-10-CM

## 2018-07-04 DIAGNOSIS — Z7982 Long term (current) use of aspirin: Secondary | ICD-10-CM | POA: Diagnosis not present

## 2018-07-04 DIAGNOSIS — M545 Low back pain: Secondary | ICD-10-CM | POA: Diagnosis present

## 2018-07-04 MED ORDER — SODIUM CHLORIDE 0.9% FLUSH
2.0000 mL | Freq: Once | INTRAVENOUS | Status: AC
  Administered 2018-07-04: 2 mL

## 2018-07-04 MED ORDER — SODIUM CHLORIDE (PF) 0.9 % IJ SOLN
INTRAMUSCULAR | Status: AC
  Filled 2018-07-04: qty 10

## 2018-07-04 MED ORDER — LIDOCAINE HCL 2 % IJ SOLN
10.0000 mL | Freq: Once | INTRAMUSCULAR | Status: AC
  Administered 2018-07-04: 400 mg
  Filled 2018-07-04: qty 40

## 2018-07-04 MED ORDER — FENTANYL CITRATE (PF) 100 MCG/2ML IJ SOLN
25.0000 ug | INTRAMUSCULAR | Status: DC | PRN
  Administered 2018-07-04: 25 ug via INTRAVENOUS
  Filled 2018-07-04: qty 2

## 2018-07-04 MED ORDER — ROPIVACAINE HCL 2 MG/ML IJ SOLN
2.0000 mL | Freq: Once | INTRAMUSCULAR | Status: AC
  Administered 2018-07-04: 20 mL via EPIDURAL
  Filled 2018-07-04: qty 10

## 2018-07-04 MED ORDER — DEXAMETHASONE SODIUM PHOSPHATE 10 MG/ML IJ SOLN
10.0000 mg | Freq: Once | INTRAMUSCULAR | Status: AC
  Administered 2018-07-04: 10 mg
  Filled 2018-07-04: qty 1

## 2018-07-04 MED ORDER — IOPAMIDOL (ISOVUE-M 200) INJECTION 41%
10.0000 mL | Freq: Once | INTRAMUSCULAR | Status: AC
  Administered 2018-07-04: 10 mL via EPIDURAL
  Filled 2018-07-04: qty 10

## 2018-07-04 MED ORDER — LACTATED RINGERS IV SOLN
1000.0000 mL | Freq: Once | INTRAVENOUS | Status: AC
  Administered 2018-07-04: 1000 mL via INTRAVENOUS

## 2018-07-04 NOTE — Patient Instructions (Addendum)
____________________________________________________________________________________________  Post-Procedure Discharge Instructions  Instructions:  Apply ice: Fill a plastic sandwich bag with crushed ice. Cover it with a small towel and apply to injection site. Apply for 15 minutes then remove x 15 minutes. Repeat sequence on day of procedure, until you go to bed. The purpose is to minimize swelling and discomfort after procedure.  Apply heat: Apply heat to procedure site starting the day following the procedure. The purpose is to treat any soreness and discomfort from the procedure.  Food intake: Start with clear liquids (like water) and advance to regular food, as tolerated.   Physical activities: Keep activities to a minimum for the first 8 hours after the procedure.   Driving: If you have received any sedation, you are not allowed to drive for 24 hours after your procedure.  Blood thinner: Restart your blood thinner 6 hours after your procedure. (Only for those taking blood thinners)  Insulin: As soon as you can eat, you may resume your normal dosing schedule. (Only for those taking insulin)  Infection prevention: Keep procedure site clean and dry.  Post-procedure Pain Diary: Extremely important that this be done correctly and accurately. Recorded information will be used to determine the next step in treatment.  Pain evaluated is that of treated area only. Do not include pain from an untreated area.  Complete every hour, on the hour, for the initial 8 hours. Set an alarm to help you do this part accurately.  Do not go to sleep and have it completed later. It will not be accurate.  Follow-up appointment: Keep your follow-up appointment after the procedure. Usually 2 weeks for most procedures. (6 weeks in the case of radiofrequency.) Bring you pain diary.   Expect:  From numbing medicine (AKA: Local Anesthetics): Numbness or decrease in pain.  Onset: Full effect within 15  minutes of injected.  Duration: It will depend on the type of local anesthetic used. On the average, 1 to 8 hours.   From steroids: Decrease in swelling or inflammation. Once inflammation is improved, relief of the pain will follow.  Onset of benefits: Depends on the amount of swelling present. The more swelling, the longer it will take for the benefits to be seen. In some cases, up to 10 days.  Duration: Steroids will stay in the system x 2 weeks. Duration of benefits will depend on multiple posibilities including persistent irritating factors.  Occasional side-effects: Facial flushing (red, warm cheeks) , cramps (if present, drink Gatorade and take over-the-counter Magnesium 450-500 mg once to twice a day).  From procedure: Some discomfort is to be expected once the numbing medicine wears off. This should be minimal if ice and heat are applied as instructed.  Call if:  You experience numbness and weakness that gets worse with time, as opposed to wearing off.  New onset bowel or bladder incontinence. (This applies to Spinal procedures only)  Emergency Numbers:  Durning business hours (Monday - Thursday, 8:00 AM - 4:00 PM) (Friday, 9:00 AM - 12:00 Noon): [phone]  After hours: [phone] ____________________________________________________________________________________________   ______________________________________________________________________________________________  Specialty Pain Scale  Introduction:  There are significant differences in how pain is reported. The word pain usually refers to physical pain, but it is also a common synonym of suffering. The medical community uses a scale from 0 (zero) to 10 (ten) to report pain level. Zero (0) is described as "no pain", while ten (10) is described as "the worse pain you can imagine". The problem with this scale  is that physical pain is reported along with suffering. Suffering refers to mental pain, or more often yet  it refers to any unpleasant feeling, emotion or aversion associated with the perception of harm or threat of harm. It is the psychological component of pain.  Pain Specialists prefer to separate the two components. The pain scale used by this practice is the Verbal Numerical Rating Scale (VNRS-11). This scale is for the physical pain only. DO NOT INCLUDE how your pain psychologically affects you. This scale is for adults 84 years of age and older. It has 11 (eleven) levels. The 1st level is 0/10. This means: "right now, I have no pain". In the context of pain management, it also means: "right now, my physical pain is under control with the current therapy".  General Information:  The scale should reflect your current level of pain. Unless you are specifically asked for the level of your worst pain, or your average pain. If you are asked for one of these two, then it should be understood that it is over the past 24 hours.  Levels 1 (one) through 5 (five) are described below, and can be treated as an outpatient. Ambulatory pain management facilities such as ours are more than adequate to treat these levels. Levels 6 (six) through 10 (ten) are also described below, however, these must be treated as a hospitalized patient. While levels 6 (six) and 7 (seven) may be evaluated at an urgent care facility, levels 8 (eight) through 10 (ten) constitute medical emergencies and as such, they belong in a hospital's emergency department. When having these levels (as described below), do not come to our office. Our facility is not equipped to manage these levels. Go directly to an urgent care facility or an emergency department to be evaluated.  Definitions:  Activities of Daily Living (ADL): Activities of daily living (ADL or ADLs) is a term used in healthcare to refer to people's daily self-care activities. Health professionals often use a person's ability or inability to perform ADLs as a measurement of their functional  status, particularly in regard to people post injury, with disabilities and the elderly. There are two ADL levels: Basic and Instrumental. Basic Activities of Daily Living (BADL  or BADLs) consist of self-care tasks that include: Bathing and showering; personal hygiene and grooming (including brushing/combing/styling hair); dressing; Toilet hygiene (getting to the toilet, cleaning oneself, and getting back up); eating and self-feeding (not including cooking or chewing and swallowing); functional mobility, often referred to as "transferring", as measured by the ability to walk, get in and out of bed, and get into and out of a chair; the broader definition (moving from one place to another while performing activities) is useful for people with different physical abilities who are still able to get around independently. Basic ADLs include the things many people do when they get up in the morning and get ready to go out of the house: get out of bed, go to the toilet, bathe, dress, groom, and eat. On the average, loss of function typically follows a particular order. Hygiene is the first to go, followed by loss of toilet use and locomotion. The last to go is the ability to eat. When there is only one remaining area in which the person is independent, there is a 62.9% chance that it is eating and only a 3.5% chance that it is hygiene. Instrumental Activities of Daily Living (IADL or IADLs) are not necessary for fundamental functioning, but they let an individual  live independently in a community. IADL consist of tasks that include: cleaning and maintaining the house; home establishment and maintenance; care of others (including selecting and supervising caregivers); care of pets; child rearing; managing money; managing financials (investments, etc.); meal preparation and cleanup; shopping for groceries and necessities; moving within the community; safety procedures and emergency responses; health management and  maintenance (taking prescribed medications); and using the telephone or other form of communication.  Instructions:  Most patients tend to report their pain as a combination of two factors, their physical pain and their psychosocial pain. This last one is also known as "suffering" and it is reflection of how physical pain affects you socially and psychologically. From now on, report them separately.  From this point on, when asked to report your pain level, report only your physical pain. Use the following table for reference.  Pain Clinic Pain Levels (0-5/10)  Pain Level Score  Description  No Pain 0   Mild pain 1 Nagging, annoying, but does not interfere with basic activities of daily living (ADL). Patients are able to eat, bathe, get dressed, toileting (being able to get on and off the toilet and perform personal hygiene functions), transfer (move in and out of bed or a chair without assistance), and maintain continence (able to control bladder and bowel functions). Blood pressure and heart rate are unaffected. A normal heart rate for a healthy adult ranges from 60 to 100 bpm (beats per minute).   Mild to moderate pain 2 Noticeable and distracting. Impossible to hide from other people. More frequent flare-ups. Still possible to adapt and function close to normal. It can be very annoying and may have occasional stronger flare-ups. With discipline, patients may get used to it and adapt.   Moderate pain 3 Interferes significantly with activities of daily living (ADL). It becomes difficult to feed, bathe, get dressed, get on and off the toilet or to perform personal hygiene functions. Difficult to get in and out of bed or a chair without assistance. Very distracting. With effort, it can be ignored when deeply involved in activities.   Moderately severe pain 4 Impossible to ignore for more than a few minutes. With effort, patients may still be able to manage work or participate in some social  activities. Very difficult to concentrate. Signs of autonomic nervous system discharge are evident: dilated pupils (mydriasis); mild sweating (diaphoresis); sleep interference. Heart rate becomes elevated (>115 bpm). Diastolic blood pressure (lower number) rises above 100 mmHg. Patients find relief in laying down and not moving.   Severe pain 5 Intense and extremely unpleasant. Associated with frowning face and frequent crying. Pain overwhelms the senses.  Ability to do any activity or maintain social relationships becomes significantly limited. Conversation becomes difficult. Pacing back and forth is common, as getting into a comfortable position is nearly impossible. Pain wakes you up from deep sleep. Physical signs will be obvious: pupillary dilation; increased sweating; goosebumps; brisk reflexes; cold, clammy hands and feet; nausea, vomiting or dry heaves; loss of appetite; significant sleep disturbance with inability to fall asleep or to remain asleep. When persistent, significant weight loss is observed due to the complete loss of appetite and sleep deprivation.  Blood pressure and heart rate becomes significantly elevated. Caution: If elevated blood pressure triggers a pounding headache associated with blurred vision, then the patient should immediately seek attention at an urgent or emergency care unit, as these may be signs of an impending stroke.    Emergency Department Pain Levels (6-10/10)  Emergency Room Pain 6 Severely limiting. Requires emergency care and should not be seen or managed at an outpatient pain management facility. Communication becomes difficult and requires great effort. Assistance to reach the emergency department may be required. Facial flushing and profuse sweating along with potentially dangerous increases in heart rate and blood pressure will be evident.   Distressing pain 7 Self-care is very difficult. Assistance is required to transport, or use restroom. Assistance to  reach the emergency department will be required. Tasks requiring coordination, such as bathing and getting dressed become very difficult.   Disabling pain 8 Self-care is no longer possible. At this level, pain is disabling. The individual is unable to do even the most "basic" activities such as walking, eating, bathing, dressing, transferring to a bed, or toileting. Fine motor skills are lost. It is difficult to think clearly.   Incapacitating pain 9 Pain becomes incapacitating. Thought processing is no longer possible. Difficult to remember your own name. Control of movement and coordination are lost.   The worst pain imaginable 10 At this level, most patients pass out from pain. When this level is reached, collapse of the autonomic nervous system occurs, leading to a sudden drop in blood pressure and heart rate. This in turn results in a temporary and dramatic drop in blood flow to the brain, leading to a loss of consciousness. Fainting is one of the body's self defense mechanisms. Passing out puts the brain in a calmed state and causes it to shut down for a while, in order to begin the healing process.    Summary: 1. Refer to this scale when providing us with your pain level. 2. Be accurate and careful when reporting your pain level. This will help with your care. 3. Over-reporting your pain level will lead to loss of credibility. 4. Even a level of 1/10 means that there is pain and will be treated at our facility. 5. High, inaccurate reporting will be documented as "Symptom Exaggeration", leading to loss of credibility and suspicions of possible secondary gains such as obtaining more narcotics, or wanting to appear disabled, for fraudulent reasons. 6. Only pain levels of 5 or below will be seen at our facility. 7. Pain levels of 6 and above will be sent to the Emergency Department and the appointment  cancelled. ______________________________________________________________________________________________   Epidural Steroid Injection An epidural steroid injection is a shot of steroid medicine and numbing medicine that is given into the space between the spinal cord and the bones in your back (epidural space). The shot helps relieve pain caused by an irritated or swollen nerve root. The amount of pain relief you get from the injection depends on what is causing the nerve to be swollen and irritated, and how long your pain lasts. You are more likely to benefit from this injection if your pain is strong and comes on suddenly rather than if you have had pain for a long time. Tell a health care provider about:  Any allergies you have.  All medicines you are taking, including vitamins, herbs, eye drops, creams, and over-the-counter medicines.  Any problems you or family members have had with anesthetic medicines.  Any blood disorders you have.  Any surgeries you have had.  Any medical conditions you have.  Whether you are pregnant or may be pregnant. What are the risks? Generally, this is a safe procedure. However, problems may occur, including:  Headache.  Bleeding.  Infection.  Allergic reaction to medicines.  Damage to your nerves.  What happens before the procedure? Staying hydrated Follow instructions from your health care provider about hydration, which may include:  Up to 2 hours before the procedure - you may continue to drink clear liquids, such as water, clear fruit juice, black coffee, and plain tea.  Eating and drinking restrictions Follow instructions from your health care provider about eating and drinking, which may include:  8 hours before the procedure - stop eating heavy meals or foods such as meat, fried foods, or fatty foods.  6 hours before the procedure - stop eating light meals or foods, such as toast or cereal.  6 hours before the procedure - stop  drinking milk or drinks that contain milk.  2 hours before the procedure - stop drinking clear liquids.  Medicine  You may be given medicines to lower anxiety.  Ask your health care provider about: ? Changing or stopping your regular medicines. This is especially important if you are taking diabetes medicines or blood thinners. ? Taking medicines such as aspirin and ibuprofen. These medicines can thin your blood. Do not take these medicines before your procedure if your health care provider instructs you not to. General instructions  Plan to have someone take you home from the hospital or clinic. What happens during the procedure?  You may receive a medicine to help you relax (sedative).  You will be asked to lie on your abdomen.  The injection site will be cleaned.  A numbing medicine (local anesthetic) will be used to numb the injection site.  A needle will be inserted through your skin into the epidural space. You may feel some discomfort when this happens. An X-ray machine will be used to make sure the needle is put as close as possible to the affected nerve.  A steroid medicine and a local anesthetic will be injected into the epidural space.  The needle will be removed.  A bandage (dressing) will be put over the injection site. What happens after the procedure?  Your blood pressure, heart rate, breathing rate, and blood oxygen level will be monitored until the medicines you were given have worn off.  Your arm or leg may feel weak or numb for a few hours.  The injection site may feel sore.  Do not drive for 24 hours if you received a sedative. This information is not intended to replace advice given to you by your health care provider. Make sure you discuss any questions you have with your health care provider. Document Released: 11/08/2007 Document Revised: 01/13/2016 Document Reviewed: 11/17/2015 Elsevier Interactive Patient Education  2018 [employer]. Post-procedure Information What to expect: Most procedures involve the use of a local anesthetic (numbing medicine), and a steroid (anti-inflammatory medicine).  The local anesthetics may cause temporary numbness and weakness of the legs or arms, depending on the location of the block. This numbness/weakness may last 4-6 hours, depending on the local anesthetic used. In rare instances, it can last up to 24 hours. While numb, you must be very careful not to injure the extremity.  After any procedure, you could expect the pain to get better within 15-20 minutes. This relief is temporary and may last 4-6 hours. Once the local anesthetics wears off, you could experience discomfort, possibly more than usual, for up to 10 (ten) days. In the case of radiofrequencies, it may last up to 6 weeks. Surgeries may take up to 8 weeks for the healing process. The discomfort is due to the irritation caused by needles going  through skin and muscle. To minimize the discomfort, we recommend using ice the first day, and heat from then on. The ice should be applied for 15 minutes on, and 15 minutes off. Keep repeating this cycle until bedtime. Avoid applying the ice directly to the skin, to prevent frostbite. Heat should be used daily, until the pain improves (4-10 days). Be careful not to burn yourself.  Occasionally you may experience muscle spasms or cramps. These occur as a consequence of the irritation caused by the needle sticks to the muscle and the blood that will inevitably be lost into the surrounding muscle tissue. Blood tends to be very irritating to tissues, which tend to react by going into spasm. These spasms may start the same day of your procedure, but they may also take days to develop. This late onset type of spasm or cramp is usually caused by electrolyte imbalances triggered by the steroids, at the level of the kidney. Cramps and spasms tend to respond well to muscle relaxants, multivitamins (some are  triggered by the procedure, but may have their origins in vitamin deficiencies), and "Gatorade", or any sports drinks that can replenish any electrolyte imbalances. (If you are a diabetic, ask your pharmacist to get you a sugar-free brand.) Warm showers or baths may also be helpful. Stretching exercises are highly recommended. General Instructions:  Be alert for signs of possible infection: redness, swelling, heat, red streaks, elevated temperature, and/or fever. These typically appear 4 to 6 days after the procedure. Immediately notify your doctor if you experience unusual bleeding, difficulty breathing, or loss of bowel or bladder control. If you experience increased pain, do not increase your pain medicine intake, unless instructed by your pain physician. Post-Procedure Care:  Be careful in moving about. Muscle spasms in the area of the injection may occur. Applying ice or heat to the area is often helpful. The incidence of spinal headaches after epidural injections ranges between 1.4% and 6%. If you develop a headache that does not seem to respond to conservative therapy, please let your physician know. This can be treated with an epidural blood patch.   Post-procedure numbness or redness is to be expected, however it should average 4 to 6 hours. If numbness and weakness of your extremities begins to develop 4 to 6 hours after your procedure, and is felt to be progressing and worsening, immediately contact your physician.   Diet:  If you experience nausea, do not eat until this sensation goes away. If you had a "Stellate Ganglion Block" for upper extremity "Reflex Sympathetic Dystrophy", do not eat or drink until your hoarseness goes away. In any case, always start with liquids first and if you tolerate them well, then slowly progress to more solid foods. Activity:  For the first 4 to 6 hours after the procedure, use caution in moving about as you may experience numbness and/or weakness. Use caution in  cooking, using household electrical appliances, and climbing steps. If you need to reach your Doctor call our office: [phone] Monday-Thursday 8:00 am - 4:00 PM    Fridays: Closed     In case of an emergency: In case of emergency, call 911 or go to the nearest emergency room and have the physician there call us.  Interpretation of Procedure Every nerve block has two components: a diagnostic component, and a treatment component. Unrealistic expectations are the most common causes of "perceived failure".  In a perfect world, a single nerve block should be able to completely and permanently  eliminate the pain. Sadly, the world is not perfect.  Most pain management nerve blocks are performed using local anesthetics and steroids. Steroids are responsible for any long-term benefit that you may experience. Their purpose is to decrease any chronic swelling that may exist in the area. Steroids begin to work immediately after being injected. However, most patients will not experience any benefits until 5 to 10 days after the injection, when the swelling has come down to the point where they can tell a difference. Steroids will only help if there is swelling to be treated. As such, they can assist with the diagnosis. If effective, they suggest an inflammatory component to the pain, and if ineffective, they rule out inflammation as the main cause or component of the problem. If the problem is one of mechanical compression, you will get no benefit from those steroids.   In the case of local anesthetics, they have a crucial role in the diagnosis of your condition. Most will begin to work within15 to 20 minutes after injection. The duration will depend on the type used (short- vs. Long-acting). It is of outmost importance that patients keep tract of their pain, after the procedure. To assist with this matter, a "Post-procedure Pain Diary" is provided. Make sure to complete it and to bring it back to your  follow-up appointment.  As long as the patient keeps accurate, detailed records of their symptoms after every procedure, and returns to have those interpreted, every procedure will provide [place] with invaluable information. Even a block that does not provide the patient with any relief, will always provide [place] with information about the mechanism and the origin of the pain. The only time a nerve block can be considered a waste of time is when patients do not keep track of the results, or do not keep their post-procedure appointment.  Reporting the results back to your physician The Pain Score  Pain is a subjective complaint. It cannot be seen, touched, or measured. We depend entirely on the patient's report of the pain in order to assess your condition and treatment. To evaluate the pain, we use a pain scale, where "0" means "No Pain", and a "10" is "the worst possible pain that you can even imagine" (i.e. something like been eaten alive by a shark or being torn apart by a lion).   You will frequently be asked to rate your pain. Please be as accurate, remember that medical decisions will be based on your responses. Please do not rate your pain above a 10. Doing so is actually interpreted as "symptom magnification" (exaggeration), as well as lack of understanding with regards to the scale. To put this into perspective, when you tell us that your pain is at a 10 (ten), what you are saying is that there is nothing we can do to make this pain any worse. (Carefully think about that.)

## 2018-07-04 NOTE — Progress Notes (Signed)
Patient's Name: Meagan Wilcox  MRN: [id number]  Referring Provider: Corky DownsMasoud, Javed, MD  DOB: Jan 22, 1931  PCP: Corky DownsMasoud, Javed, MD  DOS: 07/04/2018  Note by: Edward JollyBilal Maeson Purohit, MD  Service setting: Ambulatory outpatient  Specialty: Interventional Pain Management  Patient type: Established  Location: [facility] (AMB) Pain Management Facility  Visit type: Interventional Procedure   Primary Reason for Visit: Interventional Pain Management Treatment. CC: Back Pain (lower) and Knee Pain (bilateral)  Procedure:  Anesthesia, Analgesia, Anxiolysis:  Type: Therapeutic Inter-Laminar Epidural Steroid Injection #3 Region: Lumbar Level: L5-S1 Level. Laterality: Left-Sided         Type: Local Anesthesia Local Anesthetic: Lidocaine 1% Route: Infiltration ([place]/IM) IV Access: Secured Sedation: Meaningful verbal contact was maintained at all times during the procedure  Indication(s): Analgesia and Anxiety   Indications: 1. Lumbar radiculopathy    Pain Score: Pre-procedure: 4 /10 Post-procedure: 0-No pain/10  Pre-op Assessment:  [name] is a 84 y.o. (year old), female patient, seen today for interventional treatment. She  has a past surgical history that includes Appendectomy (1939); Colonoscopy (2010); cataract surgery  (Bilateral, 2009); left breast biopsy  (2009); Breast surgery (Left, 2013); Breast surgery (Left, May 2010); Breast biopsy (Left, 02-24-14); Breast biopsy (Left, 02/11/2016); Breast excisional biopsy (Left, 2014); and Breast biopsy (Left, 2013). [name] has a current medication list which includes the following prescription(s): amitriptyline, aspirin, biotin, calcium carbonate-vit d-min, vitamin d3, diazepam, levothyroxine, losartan-hydrochlorothiazide, omeprazole, pentosan polysulfate, and simvastatin, and the following Facility-Administered Medications: fentanyl. Her primarily concern today is the Back Pain (lower) and Knee Pain (bilateral)  Initial Vital Signs: Blood pressure (!) 168/59, pulse 64,  temperature 98.3 F (36.8 C), temperature source Oral, resp. rate 18, height 5\' 3"  (1.6 m), weight 170 lb (77.1 kg), SpO2 100 %. BMI: Estimated body mass index is 28.52 kg/m as calculated from the following:   Height as of this encounter: 5\' 3"  (1.6 m).   Weight as of this encounter: 161 lb (73 kg).  Risk Assessment: Allergies: Reviewed. She is allergic to amoxicillin and penicillins.  Allergy Precautions: None required Coagulopathies: Reviewed. None identified.  Blood-thinner therapy: None at this time Active Infection(s): Reviewed. None identified. [name] is afebrile  Site Confirmation: [name] was asked to confirm the procedure and laterality before marking the site Procedure checklist: Completed Consent: Before the procedure and under the influence of no sedative(s), amnesic(s), or anxiolytics, the patient was informed of the treatment options, risks and possible complications. To fulfill our ethical and legal obligations, as recommended by the American Medical Association's Code of Ethics, I have informed the patient of my clinical impression; the nature and purpose of the treatment or procedure; the risks, benefits, and possible complications of the intervention; the alternatives, including doing nothing; the risk(s) and benefit(s) of the alternative treatment(s) or procedure(s); and the risk(s) and benefit(s) of doing nothing. The patient was provided information about the general risks and possible complications associated with the procedure. These may include, but are not limited to: failure to achieve desired goals, infection, bleeding, organ or nerve damage, allergic reactions, paralysis, and death. In addition, the patient was informed of those risks and complications associated to Spine-related procedures, such as failure to decrease pain; infection (i.e.: Meningitis, epidural or intraspinal abscess); bleeding (i.e.: epidural hematoma, subarachnoid hemorrhage, or any other type of  intraspinal or peri-dural bleeding); organ or nerve damage (i.e.: Any type of peripheral nerve, nerve root, or spinal cord injury) with subsequent damage to sensory, motor, and/or autonomic systems, resulting in permanent pain,  numbness, and/or weakness of one or several areas of the body; allergic reactions; (i.e.: anaphylactic reaction); and/or death. Furthermore, the patient was informed of those risks and complications associated with the medications. These include, but are not limited to: allergic reactions (i.e.: anaphylactic or anaphylactoid reaction(s)); adrenal axis suppression; blood sugar elevation that in diabetics may result in ketoacidosis or comma; water retention that in patients with history of congestive heart failure may result in shortness of breath, pulmonary edema, and decompensation with resultant heart failure; weight gain; swelling or edema; medication-induced neural toxicity; particulate matter embolism and blood vessel occlusion with resultant organ, and/or nervous system infarction; and/or aseptic necrosis of one or more joints. Finally, the patient was informed that Medicine is not an exact science; therefore, there is also the possibility of unforeseen or unpredictable risks and/or possible complications that may result in a catastrophic outcome. The patient indicated having understood very clearly. We have given the patient no guarantees and we have made no promises. Enough time was given to the patient to ask questions, all of which were answered to the patient's satisfaction. Meagan Wilcox has indicated that she wanted to continue with the procedure. Attestation: I, the ordering provider, attest that I have discussed with the patient the benefits, risks, side-effects, alternatives, likelihood of achieving goals, and potential problems during recovery for the procedure that I have provided informed consent. Date: 07/04/2018; Time: 9:57 AM  Pre-Procedure Preparation:  Monitoring: As  per clinic protocol. Respiration, ETCO2, SpO2, BP, heart rate and rhythm monitor placed and checked for adequate function Safety Precautions: Patient was assessed for positional comfort and pressure points before starting the procedure. Time-out: I initiated and conducted the "Time-out" before starting the procedure, as per protocol. The patient was asked to participate by confirming the accuracy of the "Time Out" information. Verification of the correct person, site, and procedure were performed and confirmed by me, the nursing staff, and the patient. "Time-out" conducted as per Joint Commission's Universal Protocol (UP.01.01.01). "Time-out" Date & Time: 07/04/2018; 1012 hrs.  Description of Procedure Process:   Position: Prone with head of the table was raised to facilitate breathing. Target Area: The interlaminar space, initially targeting the lower laminar border of the superior vertebral body. Approach: Paramedial approach. Area Prepped: Entire Posterior Lumbar Region Prepping solution: ChloraPrep (2% chlorhexidine gluconate and 70% isopropyl alcohol) Safety Precautions: Aspiration looking for blood return was conducted prior to all injections. At no point did we inject any substances, as a needle was being advanced. No attempts were made at seeking any paresthesias. Safe injection practices and needle disposal techniques used. Medications properly checked for expiration dates. SDV (single dose vial) medications used. Description of the Procedure: Protocol guidelines were followed. The procedure needle was introduced through the skin, ipsilateral to the reported pain, and advanced to the target area. Bone was contacted and the needle walked caudad, until the lamina was cleared. The epidural space was identified using "loss-of-resistance technique" with 2-3 ml of PF-NaCl (0.9% NSS), in a 5cc LOR glass syringe. Vitals:   07/04/18 1020 07/04/18 1031 07/04/18 1041 07/04/18 1051  BP: (!) 153/63 (!)  163/70 (!) 160/72 (!) 159/72  Pulse: 61     Resp: 16 16 14 14   Temp:  (!) 97.2 F (36.2 C)    SpO2: 98% 97% 96% 96%  Weight:      Height:        Start Time: 1012 hrs. End Time: 1019 hrs. Materials:  Needle(s) Type: Epidural needle Gauge: 17G Length: 3.5-in Medication(s):  We administered lactated ringers, fentaNYL, iopamidol, ropivacaine (PF) 2 mg/mL (0.2%), sodium chloride flush, lidocaine, and dexamethasone. Please see chart orders for dosing details. 5 cc of normal saline, 2 cc of 0.2% ropivacaine, 1 cc of Decadron 10 mg/cc= 8 cc total Imaging Guidance (Spinal):  Type of Imaging Technique: Fluoroscopy Guidance (Spinal) Indication(s): Assistance in needle guidance and placement for procedures requiring needle placement in or near specific anatomical locations not easily accessible without such assistance. Exposure Time: Please see nurses notes. Contrast: Before injecting any contrast, we confirmed that the patient did not have an allergy to iodine, shellfish, or radiological contrast. Once satisfactory needle placement was completed at the desired level, radiological contrast was injected. Contrast injected under live fluoroscopy. No contrast complications. See chart for type and volume of contrast used. Fluoroscopic Guidance: I was personally present during the use of fluoroscopy. "Tunnel Vision Technique" used to obtain the best possible view of the target area. Parallax error corrected before commencing the procedure. "Direction-depth-direction" technique used to introduce the needle under continuous pulsed fluoroscopy. Once target was reached, antero-posterior, oblique, and lateral fluoroscopic projection used confirm needle placement in all planes. Images permanently stored in EMR. Interpretation: I personally interpreted the imaging intraoperatively. Adequate needle placement confirmed in multiple planes. Appropriate spread of contrast into desired area was observed. No evidence of  afferent or efferent intravascular uptake. No intrathecal or subarachnoid spread observed. Permanent images saved into the patient's record.  Antibiotic Prophylaxis:  Indication(s): None identified Antibiotic given: None  Post-operative Assessment:  EBL: None Complications: No immediate post-treatment complications observed by team, or reported by patient. Note: The patient tolerated the entire procedure well. A repeat set of vitals were taken after the procedure and the patient was kept under observation following institutional policy, for this type of procedure. Post-procedural neurological assessment was performed, showing return to baseline, prior to discharge. The patient was provided with post-procedure discharge instructions, including a section on how to identify potential problems. Should any problems arise concerning this procedure, the patient was given instructions to immediately contact us, at any time, without hesitation. In any case, we plan to contact the patient by telephone for a follow-up status report regarding this interventional procedure. Comments:  No additional relevant information.  Plan of Care  5 out of 5 strength left lower extremity: Plantar flexion, dorsiflexion, knee flexion, knee extension on discharge.  Imaging Orders     DG C-Arm 1-60 Min-No Report Procedure Orders    No procedure(s) ordered today    Medications ordered for procedure: Meds ordered this encounter  Medications  . lactated ringers infusion 1,000 mL  . fentaNYL (SUBLIMAZE) injection 25-100 mcg    Make sure Narcan is available in the pyxis when using this medication. In the event of respiratory depression (RR< 8/min): Titrate NARCAN (naloxone) in increments of 0.1 to 0.2 mg IV at 2-3 minute intervals, until desired degree of reversal.  . iopamidol (ISOVUE-M) 41 % intrathecal injection 10 mL  . ropivacaine (PF) 2 mg/mL (0.2%) (NAROPIN) injection 2 mL  . sodium chloride flush (NS) 0.9 %  injection 2 mL  . lidocaine (XYLOCAINE) 2 % (with pres) injection 200 mg  . dexamethasone (DECADRON) injection 10 mg   Medications administered: We administered lactated ringers, fentaNYL, iopamidol, ropivacaine (PF) 2 mg/mL (0.2%), sodium chloride flush, lidocaine, and dexamethasone.  See the medical record for exact dosing, route, and time of administration.  New Prescriptions   No medications on file   Disposition: Discharge home  Discharge Date & Time:  07/04/2018; 1052 hrs.   Physician-requested Follow-up: Return in about 8 weeks (around 08/29/2018) for Post Procedure Evaluation. Future Appointments  Date Time Provider Department Center  08/03/2018  2:40 PM [facility]  08/21/2018  2:30 PM Byrnett, Merrily Pew, MD [facility] None  08/28/2018 11:45 AM Edward Jolly, MD [facility] None   Primary Care Physician: Corky Downs, MD Location: [facility] Note by: Edward Jolly, MD Date: 07/04/2018; Time: 11:29 AM  Disclaimer:  Medicine is not an exact science. The only guarantee in medicine is that nothing is guaranteed. It is important to note that the decision to proceed with this intervention was based on the information collected from the patient. The Data and conclusions were drawn from the patient's questionnaire, the interview, and the physical examination. Because the information was provided in large part by the patient, it cannot be guaranteed that it has not been purposely or unconsciously manipulated. Every effort has been made to obtain as much relevant data as possible for this evaluation. It is important to note that the conclusions that lead to this procedure are derived in large part from the available data. Always take into account that the treatment will also be dependent on availability of resources and existing treatment guidelines, considered by other Pain Management Practitioners as being common knowledge and practice, at the time of the  intervention. For Medico-Legal purposes, it is also important to point out that variation in procedural techniques and pharmacological choices are the acceptable norm. The indications, contraindications, technique, and results of the above procedure should only be interpreted and judged by a Board-Certified Interventional Pain Specialist with extensive familiarity and expertise in the same exact procedure and technique.

## 2018-07-04 NOTE — Progress Notes (Signed)
Safety precautions to be maintained throughout the outpatient stay will include: orient to surroundings, keep bed in low position, maintain call bell within reach at all times, provide assistance with transfer out of bed and ambulation.  

## 2018-07-05 ENCOUNTER — Telehealth: Payer: Self-pay

## 2018-07-05 NOTE — Telephone Encounter (Signed)
Post procedure phone call.  Patient states she is doing well.  

## 2018-08-03 ENCOUNTER — Ambulatory Visit
Admission: RE | Admit: 2018-08-03 | Discharge: 2018-08-03 | Disposition: A | Discharge status: Home / Self Care | Payer: Medicare Other | Source: Ambulatory Visit | Attending: General Surgery | Admitting: General Surgery

## 2018-08-03 DIAGNOSIS — Z1231 Encounter for screening mammogram for malignant neoplasm of breast: Secondary | ICD-10-CM | POA: Diagnosis not present

## 2018-08-21 ENCOUNTER — Ambulatory Visit: Payer: Medicare Other | Admitting: General Surgery

## 2018-08-28 ENCOUNTER — Ambulatory Visit
Payer: Medicare Other | Attending: Student in an Organized Health Care Education/Training Program | Admitting: Student in an Organized Health Care Education/Training Program

## 2018-08-28 ENCOUNTER — Encounter: Payer: Self-pay | Admitting: Student in an Organized Health Care Education/Training Program

## 2018-08-28 ENCOUNTER — Other Ambulatory Visit: Payer: Self-pay

## 2018-08-28 VITALS — BP 141/54 | HR 63 | Temp 98.3°F | Resp 18 | Ht 63.0 in | Wt 163.0 lb

## 2018-08-28 DIAGNOSIS — M5416 Radiculopathy, lumbar region: Secondary | ICD-10-CM

## 2018-08-28 DIAGNOSIS — G894 Chronic pain syndrome: Secondary | ICD-10-CM | POA: Diagnosis present

## 2018-08-28 DIAGNOSIS — M170 Bilateral primary osteoarthritis of knee: Secondary | ICD-10-CM | POA: Diagnosis not present

## 2018-08-28 NOTE — Patient Instructions (Signed)
Please have pt and caregivers talk with Alona Bene

## 2018-08-28 NOTE — Progress Notes (Signed)
Patient's Name: Meagan Wilcox  MRN: [id number]  Referring Provider: Cletis Athens, MD  DOB: 18-Oct-1930  PCP: Cletis Athens, MD  DOS: 08/28/2018  Note by: Gillis Santa, MD  Service setting: Ambulatory outpatient  Specialty: Interventional Pain Management  Location: [facility] (AMB) Pain Management Facility    Patient type: Established   Primary Reason(s) for Visit: Encounter for post-procedure evaluation of chronic illness with mild to moderate exacerbation CC: Back Pain (lower) and Knee Pain (bilaterally)  HPI  Meagan Wilcox is a 85 y.o. year old, female patient, who comes today for a post-procedure evaluation. She has Hypertension; Atypical ductal hyperplasia of left breast; Lumbar radiculopathy; Neuroforaminal stenosis of lumbar spine; Lumbar degenerative disc disease; and Chronic pain syndrome on their problem list. Her primarily concern today is the Back Pain (lower) and Knee Pain (bilaterally)  Pain Assessment: Location: Lower Back Radiating: denies Onset: More than a month ago Duration: Chronic pain Quality: Aching, Discomfort, Numbness(sometimes tingling in Left foot) Severity: 0-No pain/10 (subjective, self-reported pain score)  Note: Reported level is compatible with observation.                         When using our objective Pain Scale, levels between 6 and 10/10 are said to belong in an emergency room, as it progressively worsens from a 6/10, described as severely limiting, requiring emergency care not usually available at an outpatient pain management facility. At a 6/10 level, communication becomes difficult and requires great effort. Assistance to reach the emergency department may be required. Facial flushing and profuse sweating along with potentially dangerous increases in heart rate and blood pressure will be evident. Effect on ADL: prolonged stabbing, walking Timing: Intermittent Modifying factors: procedures BP: (!) 141/54  HR: 63  Meagan Wilcox comes in today for post-procedure  evaluation.  Further details on both, my assessment(s), as well as the proposed treatment plan, please see below.  Post-Procedure Assessment  07/04/2018 Procedure: Left L5-S1 ESI Pre-procedure pain score:  4/10 Post-procedure pain score: 0/10         Influential Factors: BMI: 28.87 kg/m Intra-procedural challenges: None observed.         Assessment challenges: None detected.              Reported side-effects: None.        Post-procedural adverse reactions or complications: None reported         Sedation: Please see nurses note. When no sedatives are used, the analgesic levels obtained are directly associated to the effectiveness of the local anesthetics. However, when sedation is provided, the level of analgesia obtained during the initial 1 hour following the intervention, is believed to be the result of a combination of factors. These factors may include, but are not limited to: 1. The effectiveness of the local anesthetics used. 2. The effects of the analgesic(s) and/or anxiolytic(s) used. 3. The degree of discomfort experienced by the patient at the time of the procedure. 4. The patients ability and reliability in recalling and recording the events. 5. The presence and influence of possible secondary gains and/or psychosocial factors. Reported result: Relief experienced during the 1st hour after the procedure: 100 % (Ultra-Short Term Relief)            Interpretative annotation: Clinically appropriate result. Analgesia during this period is likely to be Local Anesthetic and/or IV Sedative (Analgesic/Anxiolytic) related.          Effects of local anesthetic: The analgesic effects attained during this period  are directly associated to the localized infiltration of local anesthetics and therefore cary significant diagnostic value as to the etiological location, or anatomical origin, of the pain. Expected duration of relief is directly dependent on the pharmacodynamics of the local  anesthetic used. Long-acting (4-6 hours) anesthetics used.  Reported result: Relief during the next 4 to 6 hour after the procedure: 100 % (Short-Term Relief)            Interpretative annotation: Clinically appropriate result. Analgesia during this period is likely to be Local Anesthetic-related.          Long-term benefit: Defined as the period of time past the expected duration of local anesthetics (1 hour for short-acting and 4-6 hours for long-acting). With the possible exception of prolonged sympathetic blockade from the local anesthetics, benefits during this period are typically attributed to, or associated with, other factors such as analgesic sensory neuropraxia, antiinflammatory effects, or beneficial biochemical changes provided by agents other than the local anesthetics.  Reported result: Extended relief following procedure: 100 %(Back pain returns once pt is on her feet for any period of time.; "in general, my back pain is better than it was before the procedure") (Long-Term Relief)            Interpretative annotation: Clinically possible results. Good relief. No permanent benefit expected. Inflammation plays a part in the etiology to the pain.          Current benefits: Defined as reported results that persistent at this point in time.   Analgesia: >50 %            Function: Somewhat improved ROM: Somewhat improved Interpretative annotation: Ongoing benefit. No permanent benefit expected. Effective diagnostic intervention.          Interpretation: Results would suggest a successful diagnostic intervention.                  Plan:  Please see "Plan of Care" for details.                Laboratory Chemistry  Inflammation Markers (CRP: Acute Phase) (ESR: Chronic Phase) No results found for: CRP, ESRSEDRATE, LATICACIDVEN                       Rheumatology Markers No results found for: RF, ANA, LABURIC, URICUR, LYMEIGGIGMAB, LYMEABIGMQN, HLAB27                      Renal Function  Markers No results found for: BUN, CREATININE, BCR, GFRAA, GFRNONAA, LABVMA, EPIRU, [id number], NOREPRU, NOREPI24HUR, DOPARU, [id number]                           Hepatic Function Markers No results found for: AST, ALT, ALBUMIN, ALKPHOS, HCVAB, AMYLASE, LIPASE, AMMONIA                      Electrolytes No results found for: NA, K, CL, CALCIUM, MG, PHOS                      Neuropathy Markers No results found for: VITAMINB12, FOLATE, HGBA1C, HIV                      CNS Tests No results found for: COLORCSF, APPEARCSF, RBCCOUNTCSF, WBCCSF, POLYSCSF, LYMPHSCSF, EOSCSF, PROTEINCSF, GLUCCSF, JCVIRUS, CSFOLI, IGGCSF  Bone Pathology Markers No results found for: VD25OH, [id number], [id number], [id number], 25OHVITD1, 25OHVITD2, 25OHVITD3, TESTOFREE, TESTOSTERONE                       Coagulation Parameters No results found for: INR, LABPROT, APTT, PLT, DDIMER, LABHEMA, VITAMINK1                      Cardiovascular Markers No results found for: BNP, CKTOTAL, CKMB, TROPONINI, HGB, HCT                       CA Markers No results found for: CEA, CA125, LABCA2                      Note: Lab results reviewed.  Recent Diagnostic Imaging Results  MM 3D SCREEN BREAST BILATERAL CLINICAL DATA:  Screening. History of multiple benign breast biopsies including LEFT breast excisional biopsy in 2014.  EXAM: DIGITAL SCREENING BILATERAL MAMMOGRAM WITH TOMO AND CAD  COMPARISON:  Previous exam(s).  ACR Breast Density Category c: The breast tissue is heterogeneously dense, which may obscure small masses.  FINDINGS: There are no findings suspicious for malignancy. Images were processed with CAD.  IMPRESSION: No mammographic evidence of malignancy. A result letter of this screening mammogram will be mailed directly to the patient.  RECOMMENDATION: Screening mammogram in one year. (Code:[id number]-01Y)  BI-RADS CATEGORY  1: Negative.  Electronically Signed   By: Franki Cabot  M.D.   On: 08/03/2018 16:32  Complexity Note: Imaging results reviewed. Results shared with Meagan Wilcox, using [employer].                         Meds   Current Outpatient Medications:  .  amitriptyline (ELAVIL) 25 MG tablet, Take 25 mg by mouth at bedtime., Disp: , Rfl:  .  aspirin 81 MG chewable tablet, Chew 81 mg by mouth daily., Disp: , Rfl:  .  Biotin 1 MG CAPS, Take by mouth., Disp: , Rfl:  .  Calcium Carbonate-Vit D-Min (CALCIUM 1200 PO), Take 1 tablet by mouth daily., Disp: , Rfl:  .  Cholecalciferol (VITAMIN D3) 1000 units CAPS, Take 2,000 Units by mouth at bedtime. , Disp: , Rfl:  .  diazepam (VALIUM) 5 MG tablet, Take 5 mg as needed by mouth for anxiety., Disp: , Rfl:  .  levothyroxine (SYNTHROID, LEVOTHROID) 100 MCG tablet, Take 100 mcg by mouth daily., Disp: , Rfl:  .  losartan-hydrochlorothiazide (HYZAAR) 100-12.5 MG tablet, Take 1 tablet daily by mouth., Disp: , Rfl:  .  omeprazole (PRILOSEC) 20 MG capsule, Take 1 capsule by mouth daily., Disp: , Rfl:  .  pentosan polysulfate (ELMIRON) 100 MG capsule, Take 100 mg by mouth 2 (two) times daily. , Disp: , Rfl:  .  simvastatin (ZOCOR) 20 MG tablet, Take 20 mg by mouth every evening., Disp: , Rfl:   ROS  Constitutional: Denies any fever or chills Gastrointestinal: No reported hemesis, hematochezia, vomiting, or acute GI distress Musculoskeletal: Denies any acute onset joint swelling, redness, loss of ROM, or weakness Neurological: No reported episodes of acute onset apraxia, aphasia, dysarthria, agnosia, amnesia, paralysis, loss of coordination, or loss of consciousness  Allergies  Meagan Wilcox is allergic to amoxicillin and penicillins.  [place]  Drug: Meagan Wilcox  reports no history of drug use. Alcohol:  reports no history of alcohol use. Tobacco:  reports that she has never smoked.  She has never used smokeless tobacco. Medical:  has a past medical history of Abnormal mammogram, unspecified (2011), Arthritis (2002), GERD  (gastroesophageal reflux disease), Heart murmur (1985), Hyperlipidemia, Hypertension (2005), and Hypothyroidism. Surgical: Meagan Wilcox  has a past surgical history that includes Appendectomy (1939); Colonoscopy (2010); cataract surgery  (Bilateral, 2009); left breast biopsy  (2009); Breast surgery (Left, 2013); Breast surgery (Left, May 2010); Breast biopsy (Left, 02-24-14); Breast biopsy (Left, 02/11/2016); Breast excisional biopsy (Left, 2014); and Breast biopsy (Left, 2013). Family: family history includes Arthritis in her father; Diabetes in her father; Heart disease in her father and mother; Stroke in her father.  Constitutional Exam  General appearance: Well nourished, well developed, and well hydrated. In no apparent acute distress Vitals:   08/28/18 1155  BP: (!) 141/54  Pulse: 63  Resp: 18  Temp: 98.3 F (36.8 C)  TempSrc: Oral  SpO2: 99%  Weight: 163 lb (73.9 kg)  Height: _0  (1.6 m)   BMI Assessment: Estimated body mass index is 28.87 kg/m as calculated from the following:   Height as of this encounter: _1  (1.6 m).   Weight as of this encounter: 163 lb (73.9 kg).  BMI interpretation table: BMI level Category Range association with higher incidence of chronic pain  <18 kg/m2 Underweight   18.5-24.9 kg/m2 Ideal body weight   25-29.9 kg/m2 Overweight Increased incidence by 20%  30-34.9 kg/m2 Obese (Class I) Increased incidence by 68%  35-39.9 kg/m2 Severe obesity (Class II) Increased incidence by 136%  >40 kg/m2 Extreme obesity (Class III) Increased incidence by 254%   Patient's current BMI Ideal Body weight  Body mass index is 28.87 kg/m. Ideal body weight: 52.4 kg (115 lb 8.3 oz) Adjusted ideal body weight: 61 kg (134 lb 8.2 oz)   BMI Readings from Last 4 Encounters:  08/28/18 28.87 kg/m  07/04/18 28.52 kg/m  09/19/17 29.41 kg/m  08/21/17 29.23 kg/m   Wt Readings from Last 4 Encounters:  08/28/18 163 lb (73.9 kg)  07/04/18 161 lb (73 kg)  09/19/17 166 lb  (75.3 kg)  08/21/17 165 lb (74.8 kg)  Psych/Mental status: Alert, oriented x 3 (person, place, & time)       Eyes: PERLA Respiratory: No evidence of acute respiratory distress  Cervical Spine Area Exam  Skin & Axial Inspection: No masses, redness, edema, swelling, or associated skin lesions Alignment: Symmetrical Functional ROM: Unrestricted ROM      Stability: No instability detected Muscle Tone/Strength: Functionally intact. No obvious neuro-muscular anomalies detected. Sensory (Neurological): Unimpaired Palpation: No palpable anomalies              Upper Extremity (UE) Exam    Side: Right upper extremity  Side: Left upper extremity  Skin & Extremity Inspection: Skin color, temperature, and hair growth are WNL. No peripheral edema or cyanosis. No masses, redness, swelling, asymmetry, or associated skin lesions. No contractures.  Skin & Extremity Inspection: Skin color, temperature, and hair growth are WNL. No peripheral edema or cyanosis. No masses, redness, swelling, asymmetry, or associated skin lesions. No contractures.  Functional ROM: Unrestricted ROM          Functional ROM: Unrestricted ROM          Muscle Tone/Strength: Functionally intact. No obvious neuro-muscular anomalies detected.  Muscle Tone/Strength: Functionally intact. No obvious neuro-muscular anomalies detected.  Sensory (Neurological): Unimpaired          Sensory (Neurological): Unimpaired          Palpation: No palpable anomalies  Palpation: No palpable anomalies              Provocative Test(s):  Phalen's test: deferred Tinel's test: deferred Apley's scratch test (touch opposite shoulder):  Action 1 (Across chest): deferred Action 2 (Overhead): deferred Action 3 (LB reach): deferred   Provocative Test(s):  Phalen's test: deferred Tinel's test: deferred Apley's scratch test (touch opposite shoulder):  Action 1 (Across chest): deferred Action 2 (Overhead): deferred Action 3 (LB reach): deferred     Thoracic Spine Area Exam  Skin & Axial Inspection: No masses, redness, or swelling Alignment: Symmetrical Functional ROM: Unrestricted ROM Stability: No instability detected Muscle Tone/Strength: Functionally intact. No obvious neuro-muscular anomalies detected. Sensory (Neurological): Unimpaired Muscle strength & Tone: No palpable anomalies  Lumbar Spine Area Exam  Skin & Axial Inspection: No masses, redness, or swelling Alignment: Symmetrical Functional ROM: Improved after treatment       Stability: No instability detected Muscle Tone/Strength: Functionally intact. No obvious neuro-muscular anomalies detected. Sensory (Neurological): Improved Palpation: No palpable anomalies       Provocative Tests: Hyperextension/rotation test: Improved after treatment       Lumbar quadrant test (Kemp's test): deferred today       Lateral bending test: deferred today       Patrick's Maneuver: deferred today                   FABER* test: deferred today                   S-I anterior distraction/compression test: deferred today         S-I lateral compression test: deferred today         S-I Thigh-thrust test: deferred today         S-I Gaenslen's test: deferred today         *(Flexion, ABduction and External Rotation)  Gait & Posture Assessment  Ambulation: Unassisted Gait: Relatively normal for age and body habitus Posture: WNL   Lower Extremity Exam    Side: Right lower extremity  Side: Left lower extremity  Stability: No instability observed          Stability: No instability observed          Skin & Extremity Inspection: Skin color, temperature, and hair growth are WNL. No peripheral edema or cyanosis. No masses, redness, swelling, asymmetry, or associated skin lesions. No contractures.  Skin & Extremity Inspection: Skin color, temperature, and hair growth are WNL. No peripheral edema or cyanosis. No masses, redness, swelling, asymmetry, or associated skin lesions. No contractures.   Functional ROM: Decreased ROM for knee joint          Functional ROM: Decreased ROM for knee joint          Muscle Tone/Strength: Functionally intact. No obvious neuro-muscular anomalies detected.  Muscle Tone/Strength: Functionally intact. No obvious neuro-muscular anomalies detected.  Sensory (Neurological): Arthropathic arthralgia        Sensory (Neurological): Arthropathic arthralgia        DTR: Patellar: deferred today Achilles: deferred today Plantar: deferred today  DTR: Patellar: deferred today Achilles: deferred today Plantar: deferred today  Palpation: No palpable anomalies  Palpation: No palpable anomalies   Assessment  Primary Diagnosis & Pertinent Problem List: The primary encounter diagnosis was Primary osteoarthritis of both knees. Diagnoses of Chronic pain syndrome and Lumbar radiculopathy were also pertinent to this visit.  Status Diagnosis  Having a Flare-up Persistent Improved 1. Primary osteoarthritis of both knees   2. Chronic pain  syndrome   3. Lumbar radiculopathy       85 year old female with a history of chronic pain secondary to lumbar radiculopathy who follows up status post left L5-S1 ESI.  Patient follows up today for postprocedural evaluation.  She endorses approximately 50% pain relief that is ongoing in regards to her axial low back pain that radiates down her left leg.  Patient today is endorsing worsening knee pain.  Patient has tried Visco supplementation via Hyalgan with me which was not effective.  She has previously had intra-articular knee steroid injections which she found effective.  She is interested in having these repeated.  Risks and benefits of intra-articular knee steroid injections were discussed with patient and patient like to proceed.  Plan: -Bilateral knee steroid injection  Lab-work, procedure(s), and/or referral(s): Orders Placed This Encounter  Procedures  . KNEE INJECTION   Provider-requested follow-up: Return in about 1  week (around 09/04/2018) for Procedure.  Time Note: Greater than 50% of the 25 minute(s) of face-to-face time spent with Meagan Wilcox, was spent in counseling/coordination of care regarding: Meagan Wilcox primary cause of pain, the treatment plan, treatment alternatives, the risks and possible complications of proposed treatment, going over the informed consent, the results, interpretation and significance of  her recent diagnostic interventional treatment(s), realistic expectations and the goals of pain management (increased in functionality).  Future Appointments  Date Time Provider [place]  09/03/2018 10:30 AM Gillis Santa, MD [facility] None  09/27/2018  2:00 PM Byrnett, Forest Gleason, MD [facility] None    Primary Care Physician: Cletis Athens, MD Location: [facility] Note by: Gillis Santa, M.D Date: 08/28/2018; Time: 2:47 PM  Patient Instructions  Please have pt and caregivers talk with Blanch Media

## 2018-08-28 NOTE — Progress Notes (Signed)
Safety precautions to be maintained throughout the outpatient stay will include: orient to surroundings, keep bed in low position, maintain call bell within reach at all times, provide assistance with transfer out of bed and ambulation.  

## 2018-09-03 ENCOUNTER — Ambulatory Visit: Payer: Medicare Other | Admitting: Student in an Organized Health Care Education/Training Program

## 2018-09-27 ENCOUNTER — Ambulatory Visit: Payer: Medicare Other | Admitting: General Surgery

## 2018-09-27 ENCOUNTER — Other Ambulatory Visit: Payer: Self-pay

## 2018-09-27 ENCOUNTER — Encounter: Payer: Self-pay | Admitting: General Surgery

## 2018-09-27 VITALS — BP 144/66 | HR 66 | Resp 12 | Ht 63.0 in | Wt 165.0 lb

## 2018-09-27 DIAGNOSIS — Z1231 Encounter for screening mammogram for malignant neoplasm of breast: Secondary | ICD-10-CM | POA: Diagnosis not present

## 2018-09-27 NOTE — Progress Notes (Signed)
Patient ID: Meagan Wilcox, female   DOB: 06-Mar-1931, 85 y.o.   MRN: [id number]  Chief Complaint  Patient presents with  . Follow-up    f/u recall bil screening mammo [facility] 08/03/18    HPI Meagan Wilcox is a 85 y.o. female.  who presents for a breast evaluation. The most recent mammogram was done on 08-03-18.  Patient does perform regular self breast checks and gets regular mammograms done.   No new breast issues. Occasional left breast discomfort, but it doesn't last long. She states her back and knees are causing more discomfort and has gotten injections.  HPI  Past Medical History:  Diagnosis Date  . Abnormal mammogram, unspecified 2011  . Arthritis 2002  . GERD (gastroesophageal reflux disease)   . Heart murmur 1985  . Hyperlipidemia   . Hypertension 2005  . Hypothyroidism     Past Surgical History:  Procedure Laterality Date  . APPENDECTOMY  1939  . BREAST BIOPSY Left 02-24-14   benign  . BREAST BIOPSY Left 02/11/2016   neg  . BREAST BIOPSY Left 2013   neg  . BREAST EXCISIONAL BIOPSY Left 2014   neg  . BREAST SURGERY Left 2013   stereotactic biopsy   . BREAST SURGERY Left May 2010   1 mm foci of atypical ductal hyperplasia  . cataract surgery  Bilateral 2009  . COLONOSCOPY  2010   Dr. Mechele Collin  . left breast biopsy   2009    Family History  Problem Relation Age of Onset  . Heart disease Mother   . Heart disease Father   . Diabetes Father   . Stroke Father   . Arthritis Father     Social History Social History   Tobacco Use  . Smoking status: Never Smoker  . Smokeless tobacco: Never Used  Substance Use Topics  . Alcohol use: No    Alcohol/week: 0.0 standard drinks  . Drug use: No    Allergies  Allergen Reactions  . Amoxicillin Diarrhea  . Penicillins Rash    Current Outpatient Medications  Medication Sig Dispense Refill  . amitriptyline (ELAVIL) 25 MG tablet Take 25 mg by mouth at bedtime.    Marland Kitchen aspirin 81 MG chewable tablet Chew 81 mg by  mouth daily.    . Biotin 1 MG CAPS Take by mouth.    . Calcium Carbonate-Vit D-Min (CALCIUM 1200 PO) Take 1 tablet by mouth daily.    . Cholecalciferol (VITAMIN D3) 1000 units CAPS Take 2,000 Units by mouth at bedtime.     . diazepam (VALIUM) 5 MG tablet Take 5 mg as needed by mouth for anxiety.    Marland Kitchen levothyroxine (SYNTHROID, LEVOTHROID) 100 MCG tablet Take 100 mcg by mouth daily.    Marland Kitchen losartan-hydrochlorothiazide (HYZAAR) 100-12.5 MG tablet Take 1 tablet daily by mouth.    Marland Kitchen omeprazole (PRILOSEC) 20 MG capsule Take 1 capsule by mouth daily.    . pentosan polysulfate (ELMIRON) 100 MG capsule Take 100 mg by mouth 2 (two) times daily.     . simvastatin (ZOCOR) 20 MG tablet Take 20 mg by mouth every evening.     No current facility-administered medications for this visit.     Review of Systems Review of Systems  Constitutional: Negative.   Respiratory: Negative.   Cardiovascular: Negative.   Musculoskeletal:       Significant difficulty with back and knee pain.    Blood pressure (!) 144/66, pulse 66, resp. rate 12, height 5\' 3"  (1.6 m), weight  165 lb (74.8 kg), SpO2 98 %.  Physical Exam Physical Exam Constitutional:      Appearance: She is well-developed.  Eyes:     General: No scleral icterus.    Conjunctiva/sclera: Conjunctivae normal.  Neck:     Musculoskeletal: Neck supple.  Cardiovascular:     Rate and Rhythm: Normal rate and regular rhythm.     Heart sounds: Normal heart sounds.  Pulmonary:     Effort: Pulmonary effort is normal.     Breath sounds: Normal breath sounds.  Chest:     Breasts:        Right: No inverted nipple, mass, nipple discharge, skin change or tenderness.        Left: No inverted nipple, mass, nipple discharge, skin change or tenderness.  Lymphadenopathy:     Cervical: No cervical adenopathy.     Upper Body:     Right upper body: No axillary adenopathy.     Left upper body: No axillary adenopathy.  Skin:    General: Skin is warm and dry.   Neurological:     Mental Status: She is alert and oriented to person, place, and time.  Psychiatric:        Behavior: Behavior normal.     Data Reviewed Bilateral mammograms dated August 03, 2018 were reviewed.  BI-RADS-1.  Assessment    Benign breast exam.  Significant knee and back pain.    Plan    The patient is aware to call back for any questions or new concerns. Patient will be asked to return to the office in one year with a bilateral screening mammogram.     The patient had been discouraged from considering surgical intervention by her primary care physician.  She is in otherwise excellent health and shows significant mobility loss due to her back and knee pain.  She has been encouraged to have a reassessment completed to see if any options are available which would allow her to maintain her mobility.  HPI, assessment, plan and physical exam has been scribed under the direction and in the presence of Earline MayotteJeffrey W. Dantonio Justen, MD. Dorathy DaftMarsha Hatch, RN  I have completed the exam and reviewed the above documentation for accuracy and completeness.  I agree with the above.  [employer] has been used and any errors in dictation or transcription are unintentional.  Donnalee CurryJeffrey Kaylee Wombles, M.D., F.A.C.S.  Merrily PewJeffrey W Kelsei Defino 09/28/2018, 6:28 AM

## 2018-09-27 NOTE — Patient Instructions (Addendum)
The patient is aware to call back for any questions or new concerns. Patient will be asked to return to the office in one year with a bilateral screening mammogram.  

## 2019-03-08 ENCOUNTER — Encounter: Payer: Self-pay | Admitting: General Surgery

## 2019-05-07 ENCOUNTER — Encounter: Payer: Self-pay | Admitting: Emergency Medicine

## 2019-05-07 ENCOUNTER — Inpatient Hospital Stay
Admission: EM | Admit: 2019-05-07 | Discharge: 2019-05-09 | DRG: 310 | Disposition: A | Discharge status: Home Health | Payer: Medicare Other | Attending: Internal Medicine | Admitting: Internal Medicine

## 2019-05-07 ENCOUNTER — Other Ambulatory Visit: Payer: Self-pay

## 2019-05-07 ENCOUNTER — Inpatient Hospital Stay (HOSPITAL_COMMUNITY)
Admit: 2019-05-07 | Discharge: 2019-05-07 | Disposition: A | Discharge status: Home / Self Care | Payer: Medicare Other | Attending: Internal Medicine | Admitting: Internal Medicine

## 2019-05-07 ENCOUNTER — Emergency Department: Payer: Medicare Other

## 2019-05-07 DIAGNOSIS — Z20828 Contact with and (suspected) exposure to other viral communicable diseases: Secondary | ICD-10-CM | POA: Diagnosis present

## 2019-05-07 DIAGNOSIS — I361 Nonrheumatic tricuspid (valve) insufficiency: Secondary | ICD-10-CM

## 2019-05-07 DIAGNOSIS — K219 Gastro-esophageal reflux disease without esophagitis: Secondary | ICD-10-CM | POA: Diagnosis present

## 2019-05-07 DIAGNOSIS — I10 Essential (primary) hypertension: Secondary | ICD-10-CM

## 2019-05-07 DIAGNOSIS — I340 Nonrheumatic mitral (valve) insufficiency: Secondary | ICD-10-CM

## 2019-05-07 DIAGNOSIS — J029 Acute pharyngitis, unspecified: Secondary | ICD-10-CM | POA: Diagnosis not present

## 2019-05-07 DIAGNOSIS — I483 Typical atrial flutter: Secondary | ICD-10-CM | POA: Diagnosis not present

## 2019-05-07 DIAGNOSIS — E785 Hyperlipidemia, unspecified: Secondary | ICD-10-CM | POA: Diagnosis present

## 2019-05-07 DIAGNOSIS — I959 Hypotension, unspecified: Secondary | ICD-10-CM | POA: Diagnosis not present

## 2019-05-07 DIAGNOSIS — Z79899 Other long term (current) drug therapy: Secondary | ICD-10-CM

## 2019-05-07 DIAGNOSIS — E039 Hypothyroidism, unspecified: Secondary | ICD-10-CM | POA: Diagnosis present

## 2019-05-07 DIAGNOSIS — H9190 Unspecified hearing loss, unspecified ear: Secondary | ICD-10-CM | POA: Diagnosis present

## 2019-05-07 DIAGNOSIS — Z7982 Long term (current) use of aspirin: Secondary | ICD-10-CM

## 2019-05-07 DIAGNOSIS — I4891 Unspecified atrial fibrillation: Secondary | ICD-10-CM | POA: Diagnosis present

## 2019-05-07 DIAGNOSIS — R531 Weakness: Secondary | ICD-10-CM | POA: Diagnosis not present

## 2019-05-07 DIAGNOSIS — Z23 Encounter for immunization: Secondary | ICD-10-CM | POA: Diagnosis present

## 2019-05-07 DIAGNOSIS — Z8249 Family history of ischemic heart disease and other diseases of the circulatory system: Secondary | ICD-10-CM | POA: Diagnosis not present

## 2019-05-07 DIAGNOSIS — I4892 Unspecified atrial flutter: Secondary | ICD-10-CM | POA: Diagnosis present

## 2019-05-07 DIAGNOSIS — Z79890 Hormone replacement therapy (postmenopausal): Secondary | ICD-10-CM | POA: Diagnosis not present

## 2019-05-07 DIAGNOSIS — I484 Atypical atrial flutter: Principal | ICD-10-CM | POA: Diagnosis present

## 2019-05-07 DIAGNOSIS — R000 Tachycardia, unspecified: Secondary | ICD-10-CM

## 2019-05-07 DIAGNOSIS — H9193 Unspecified hearing loss, bilateral: Secondary | ICD-10-CM | POA: Diagnosis not present

## 2019-05-07 LAB — CBC WITH DIFFERENTIAL/PLATELET
Abs Immature Granulocytes: 0.04 10*3/uL (ref 0.00–0.07)
Basophils Absolute: 0.1 10*3/uL (ref 0.0–0.1)
Basophils Relative: 1 %
Eosinophils Absolute: 0.1 10*3/uL (ref 0.0–0.5)
Eosinophils Relative: 1 %
HCT: 40.9 % (ref 36.0–46.0)
Hemoglobin: 13.5 g/dL (ref 12.0–15.0)
Immature Granulocytes: 1 %
Lymphocytes Relative: 34 %
Lymphs Abs: 2.6 10*3/uL (ref 0.7–4.0)
MCH: 30.4 pg (ref 26.0–34.0)
MCHC: 33 g/dL (ref 30.0–36.0)
MCV: 92.1 fL (ref 80.0–100.0)
Monocytes Absolute: 0.7 10*3/uL (ref 0.1–1.0)
Monocytes Relative: 9 %
Neutro Abs: 4.3 10*3/uL (ref 1.7–7.7)
Neutrophils Relative %: 54 %
Platelets: 255 10*3/uL (ref 150–400)
RBC: 4.44 MIL/uL (ref 3.87–5.11)
RDW: 13.6 % (ref 11.5–15.5)
WBC: 7.8 10*3/uL (ref 4.0–10.5)
nRBC: 0 % (ref 0.0–0.2)

## 2019-05-07 LAB — URINALYSIS, COMPLETE (UACMP) WITH MICROSCOPIC
Bacteria, UA: NONE SEEN
Bilirubin Urine: NEGATIVE
Glucose, UA: NEGATIVE mg/dL
Hgb urine dipstick: NEGATIVE
Ketones, ur: NEGATIVE mg/dL
Leukocytes,Ua: NEGATIVE
Nitrite: NEGATIVE
Protein, ur: NEGATIVE mg/dL
Specific Gravity, Urine: 1.003 — ABNORMAL LOW (ref 1.005–1.030)
Squamous Epithelial / HPF: NONE SEEN (ref 0–5)
WBC, UA: NONE SEEN WBC/hpf (ref 0–5)
pH: 5 (ref 5.0–8.0)

## 2019-05-07 LAB — MAGNESIUM: Magnesium: 1.9 mg/dL (ref 1.7–2.4)

## 2019-05-07 LAB — TROPONIN I (HIGH SENSITIVITY)
Troponin I (High Sensitivity): 6 ng/L (ref <18)
Troponin I (High Sensitivity): 7 ng/L (ref <18)

## 2019-05-07 LAB — APTT: aPTT: 29 seconds (ref 24–36)

## 2019-05-07 LAB — PROTIME-INR
INR: 1 (ref 0.8–1.2)
Prothrombin Time: 13.1 seconds (ref 11.4–15.2)

## 2019-05-07 LAB — COMPREHENSIVE METABOLIC PANEL
ALT: 17 U/L (ref 0–44)
AST: 21 U/L (ref 15–41)
Albumin: 4.5 g/dL (ref 3.5–5.0)
Alkaline Phosphatase: 49 U/L (ref 38–126)
Anion gap: 11 (ref 5–15)
BUN: 18 mg/dL (ref 8–23)
CO2: 25 mmol/L (ref 22–32)
Calcium: 10.2 mg/dL (ref 8.9–10.3)
Chloride: 103 mmol/L (ref 98–111)
Creatinine, Ser: 0.74 mg/dL (ref 0.44–1.00)
GFR calc Af Amer: >60 mL/min (ref >60)
GFR calc non Af Amer: >60 mL/min (ref >60)
Glucose, Bld: 116 mg/dL — ABNORMAL HIGH (ref 70–99)
Potassium: 4 mmol/L (ref 3.5–5.1)
Sodium: 139 mmol/L (ref 135–145)
Total Bilirubin: 0.6 mg/dL (ref 0.3–1.2)
Total Protein: 7.6 g/dL (ref 6.5–8.1)

## 2019-05-07 LAB — T4, FREE: Free T4: 1.64 ng/dL — ABNORMAL HIGH (ref 0.61–1.12)

## 2019-05-07 LAB — PHOSPHORUS: Phosphorus: 3.9 mg/dL (ref 2.5–4.6)

## 2019-05-07 LAB — TSH: TSH: 5.369 u[IU]/mL — ABNORMAL HIGH (ref 0.350–4.500)

## 2019-05-07 MED ORDER — HEPARIN BOLUS VIA INFUSION
4000.0000 [IU] | Freq: Once | INTRAVENOUS | Status: AC
  Administered 2019-05-07: 4000 [IU] via INTRAVENOUS
  Filled 2019-05-07: qty 4000

## 2019-05-07 MED ORDER — DILTIAZEM HCL 100 MG IV SOLR
5.0000 mg/h | INTRAVENOUS | Status: DC
  Administered 2019-05-07: 13:00:00 5 mg/h via INTRAVENOUS
  Administered 2019-05-07: 15 mg/h via INTRAVENOUS
  Administered 2019-05-08: 12.5 mg/h via INTRAVENOUS
  Administered 2019-05-08: 10 mg/h via INTRAVENOUS
  Filled 2019-05-07 (×4): qty 100

## 2019-05-07 MED ORDER — LOSARTAN POTASSIUM-HCTZ 100-12.5 MG PO TABS: 1.0000 | ORAL_TABLET | Freq: Every day | ORAL | Status: DC

## 2019-05-07 MED ORDER — ACETAMINOPHEN 650 MG RE SUPP: 650.0000 mg | Freq: Four times a day (QID) | RECTAL | Status: DC | PRN

## 2019-05-07 MED ORDER — HYDROCHLOROTHIAZIDE 12.5 MG PO CAPS
12.5000 mg | ORAL_CAPSULE | Freq: Every day | ORAL | Status: DC
  Administered 2019-05-09: 12.5 mg via ORAL
  Filled 2019-05-07: qty 1

## 2019-05-07 MED ORDER — SODIUM CHLORIDE 0.9% FLUSH
3.0000 mL | Freq: Two times a day (BID) | INTRAVENOUS | Status: DC
  Administered 2019-05-08: 21:00:00 3 mL via INTRAVENOUS

## 2019-05-07 MED ORDER — LEVOTHYROXINE SODIUM 100 MCG PO TABS
100.0000 ug | ORAL_TABLET | Freq: Every day | ORAL | Status: DC
  Administered 2019-05-08 – 2019-05-09 (×2): 100 ug via ORAL
  Filled 2019-05-07 (×2): qty 1

## 2019-05-07 MED ORDER — SODIUM CHLORIDE 0.9 % IV SOLN
250.0000 mL | INTRAVENOUS | Status: DC | PRN
  Administered 2019-05-08: 13:00:00 via INTRAVENOUS

## 2019-05-07 MED ORDER — PANTOPRAZOLE SODIUM 40 MG PO TBEC
40.0000 mg | DELAYED_RELEASE_TABLET | Freq: Every day | ORAL | Status: DC
  Administered 2019-05-07 – 2019-05-09 (×2): 40 mg via ORAL
  Filled 2019-05-07 (×2): qty 1

## 2019-05-07 MED ORDER — INFLUENZA VAC A&B SA ADJ QUAD 0.5 ML IM PRSY
0.5000 mL | PREFILLED_SYRINGE | INTRAMUSCULAR | Status: AC
  Administered 2019-05-09: 15:00:00 0.5 mL via INTRAMUSCULAR
  Filled 2019-05-07: qty 0.5

## 2019-05-07 MED ORDER — ACETAMINOPHEN 325 MG PO TABS
650.0000 mg | ORAL_TABLET | Freq: Four times a day (QID) | ORAL | Status: DC | PRN
  Administered 2019-05-08: 650 mg via ORAL
  Filled 2019-05-07 (×2): qty 2

## 2019-05-07 MED ORDER — LOSARTAN POTASSIUM 50 MG PO TABS
100.0000 mg | ORAL_TABLET | Freq: Every day | ORAL | Status: DC
  Administered 2019-05-09: 100 mg via ORAL
  Filled 2019-05-07: qty 2

## 2019-05-07 MED ORDER — DILTIAZEM HCL 25 MG/5ML IV SOLN
10.0000 mg | Freq: Once | INTRAVENOUS | Status: AC
  Administered 2019-05-07: 10 mg via INTRAVENOUS
  Filled 2019-05-07: qty 5

## 2019-05-07 MED ORDER — ONDANSETRON HCL 4 MG PO TABS: 4.0000 mg | ORAL_TABLET | Freq: Four times a day (QID) | ORAL | Status: DC | PRN

## 2019-05-07 MED ORDER — SODIUM CHLORIDE 0.9 % IV BOLUS
500.0000 mL | Freq: Once | INTRAVENOUS | Status: AC
  Administered 2019-05-07: 500 mL via INTRAVENOUS

## 2019-05-07 MED ORDER — METOPROLOL TARTRATE 25 MG PO TABS
12.5000 mg | ORAL_TABLET | Freq: Four times a day (QID) | ORAL | Status: DC
  Administered 2019-05-07 – 2019-05-09 (×8): 12.5 mg via ORAL
  Filled 2019-05-07 (×8): qty 1

## 2019-05-07 MED ORDER — SODIUM CHLORIDE 0.9% FLUSH: 3.0000 mL | INTRAVENOUS | Status: DC | PRN

## 2019-05-07 MED ORDER — ONDANSETRON HCL 4 MG/2ML IJ SOLN: 4.0000 mg | Freq: Four times a day (QID) | INTRAMUSCULAR | Status: DC | PRN

## 2019-05-07 MED ORDER — AMITRIPTYLINE HCL 25 MG PO TABS
25.0000 mg | ORAL_TABLET | Freq: Every day | ORAL | Status: DC
  Administered 2019-05-07 – 2019-05-08 (×2): 25 mg via ORAL
  Filled 2019-05-07 (×2): qty 1

## 2019-05-07 MED ORDER — ALBUTEROL SULFATE (2.5 MG/3ML) 0.083% IN NEBU: 2.5000 mg | INHALATION_SOLUTION | RESPIRATORY_TRACT | Status: DC | PRN

## 2019-05-07 MED ORDER — HYDROCODONE-ACETAMINOPHEN 5-325 MG PO TABS: 1.0000 | ORAL_TABLET | ORAL | Status: DC | PRN

## 2019-05-07 MED ORDER — ASPIRIN EC 81 MG PO TBEC
81.0000 mg | DELAYED_RELEASE_TABLET | Freq: Every day | ORAL | Status: DC
  Administered 2019-05-07 – 2019-05-09 (×2): 81 mg via ORAL
  Filled 2019-05-07 (×2): qty 1

## 2019-05-07 MED ORDER — HEPARIN (PORCINE) 25000 UT/250ML-% IV SOLN
900.0000 [IU]/h | INTRAVENOUS | Status: DC
  Administered 2019-05-07: 1150 [IU]/h via INTRAVENOUS
  Administered 2019-05-08: 950 [IU]/h via INTRAVENOUS
  Filled 2019-05-07 (×2): qty 250

## 2019-05-07 MED ORDER — PENTOSAN POLYSULFATE SODIUM 100 MG PO CAPS
100.0000 mg | ORAL_CAPSULE | Freq: Three times a day (TID) | ORAL | Status: DC
  Administered 2019-05-07 – 2019-05-09 (×5): 100 mg via ORAL
  Filled 2019-05-07 (×8): qty 1

## 2019-05-07 MED ORDER — BISACODYL 5 MG PO TBEC: 5.0000 mg | DELAYED_RELEASE_TABLET | Freq: Every day | ORAL | Status: DC | PRN

## 2019-05-07 MED ORDER — SENNOSIDES-DOCUSATE SODIUM 8.6-50 MG PO TABS: 1.0000 | ORAL_TABLET | Freq: Every evening | ORAL | Status: DC | PRN

## 2019-05-07 MED ORDER — SIMVASTATIN 20 MG PO TABS
20.0000 mg | ORAL_TABLET | Freq: Every evening | ORAL | Status: DC
  Administered 2019-05-07 – 2019-05-08 (×2): 20 mg via ORAL
  Filled 2019-05-07 (×2): qty 1

## 2019-05-07 MED ORDER — DIAZEPAM 5 MG PO TABS: 5.0000 mg | ORAL_TABLET | Freq: Every day | ORAL | Status: DC | PRN

## 2019-05-07 NOTE — ED Provider Notes (Signed)
[facility] Emergency Department Provider Note  ____________________________________________   First MD Initiated Contact with Patient 05/07/19 1052     (approximate)  I have reviewed the triage vital signs and the nursing notes.  History  Chief Complaint Tachycardia    HPI Meagan Wilcox is a 85 y.o. female with a history of hypothyroidism, hyperlipidemia, hypertension who presents emergency department for generalized weakness and tachycardia.  Patient complains of generalized weakness and overall fatigue for the last several months.  Today, she had a home health check, and the RN noted her to be tachycardic, prompting her to be evaluated in the ED.  Patient denies feeling any heart racing or palpitation sensatios until she was informed that her heart rate was elevated.  She denies any shortness of breath or chest pain.  No nausea or vomiting.  She does report intermittent loose stools.  She denies any prior history of arrhythmias, including atrial fibrillation, or atrial flutter.   Past Medical Hx Past Medical History:  Diagnosis Date  . Abnormal mammogram, unspecified 2011  . Arthritis 2002  . GERD (gastroesophageal reflux disease)   . Heart murmur 1985  . Hyperlipidemia   . Hypertension 2005  . Hypothyroidism     Problem List Patient Active Problem List   Diagnosis Date Noted  . Lumbar radiculopathy 08/01/2017  . Neuroforaminal stenosis of lumbar spine 08/01/2017  . Lumbar degenerative disc disease 08/01/2017  . Chronic pain syndrome 08/01/2017  . Atypical ductal hyperplasia of left breast 08/31/2016  . Hypertension     Past Surgical Hx Past Surgical History:  Procedure Laterality Date  . APPENDECTOMY  1939  . BREAST BIOPSY Left 02-24-14   benign  . BREAST BIOPSY Left 02/11/2016   neg  . BREAST BIOPSY Left 2013   neg  . BREAST EXCISIONAL BIOPSY Left 2014   neg  . BREAST SURGERY Left 2013   stereotactic biopsy   . BREAST SURGERY Left  May 2010   1 mm foci of atypical ductal hyperplasia  . cataract surgery  Bilateral 2009  . COLONOSCOPY  2010   Dr. Mechele Collin  . left breast biopsy   2009    Medications Prior to Admission medications   Medication Sig Start Date End Date Taking? Authorizing Provider  amitriptyline (ELAVIL) 25 MG tablet Take 25 mg by mouth at bedtime.   Yes [provider]  aspirin 81 MG EC tablet Take 81 mg by mouth daily.    Yes [provider]  Biotin 10 MG TABS Take 10 mg by mouth daily.    Yes [provider]  diazepam (VALIUM) 5 MG tablet Take 5 mg by mouth daily as needed for anxiety.    Yes [provider]  levothyroxine (SYNTHROID, LEVOTHROID) 100 MCG tablet Take 100 mcg by mouth daily.   Yes [provider]  losartan-hydrochlorothiazide (HYZAAR) 100-12.5 MG tablet Take 1 tablet daily by mouth.   Yes [provider]  omeprazole (PRILOSEC) 20 MG capsule Take 20 mg by mouth daily.    Yes [provider]  pentosan polysulfate (ELMIRON) 100 MG capsule Take 100 mg by mouth 3 (three) times daily.    Yes [provider]  simvastatin (ZOCOR) 20 MG tablet Take 20 mg by mouth every evening.   Yes [provider]    Allergies Amoxicillin and Penicillins  Family Hx Family History  Problem Relation Age of Onset  . Heart disease Mother   . Heart disease Father   . Diabetes  Father   . Stroke Father   . Arthritis Father     Social Hx Social History   Tobacco Use  . Smoking status: Never Smoker  . Smokeless tobacco: Never Used  Substance Use Topics  . Alcohol use: No    Alcohol/week: 0.0 standard drinks  . Drug use: No     Review of Systems  Constitutional: Negative for fever, chills. + generalized weakness Eyes: Negative for visual changes. ENT: Negative for sore throat. Cardiovascular: Negative for chest pain. + tachycardia Respiratory: Negative for shortness of breath. Gastrointestinal: Negative for nausea,  vomiting.  Genitourinary: Negative for dysuria. Musculoskeletal: Negative for leg swelling. Skin: Negative for rash. Neurological: Negative for for headaches.   Physical Exam  Vital Signs: ED Triage Vitals  Enc Vitals Group     BP 05/07/19 1046 (!) 153/111     Pulse Rate 05/07/19 1046 (!) 127     Resp 05/07/19 1046 20     Temp 05/07/19 1046 98 F (36.7 C)     Temp Source 05/07/19 1046 Oral     SpO2 05/07/19 1046 99 %     Weight 05/07/19 1047 190 lb (86.2 kg)     Height 05/07/19 1047 5\' 6"  (1.676 m)     Head Circumference --      Peak Flow --      Pain Score 05/07/19 1047 0     Pain Loc --      Pain Edu? --      Excl. in GC? --     Constitutional: Alert and oriented.  Head: Normocephalic. Atraumatic. Eyes: Conjunctivae clear. Sclera anicteric. Nose: No congestion. No rhinorrhea. Mouth/Throat: Mucous membranes are moist.  Neck: No stridor.   Cardiovascular: Tachycardic. Extremities well perfused. Respiratory: Normal respiratory effort.  Lungs CTAB. Gastrointestinal: Soft. Non-tender. Non-distended.  Musculoskeletal: No lower extremity edema. No deformities. Neurologic:  Normal speech and language. No gross focal neurologic deficits are appreciated.  Skin: Skin is warm, dry and intact. No rash noted. Psychiatric: Mood and affect are appropriate for situation.  EKG  Personally reviewed.   Rate: 120s Rhythm: appears to be atrial flutter, with 2:1 block Axis: leftward Intervals: QTc WNL No STEMI Concern for atrial flutter with 2:1 block    Radiology  XR: IMPRESSION: No acute cardiopulmonary disease.     Procedures  Procedure(s) performed (including critical care):  .Critical Care Performed by: Miguel Aschoff., MD Authorized by: Miguel Aschoff., MD   Critical care provider statement:    Critical care time (minutes):  45   Critical care was necessary to treat or prevent imminent or life-threatening deterioration of the following conditions:  Cardiac  failure   Critical care was time spent personally by me on the following activities:  Discussions with consultants, evaluation of patient's response to treatment, examination of patient, ordering and performing treatments and interventions, ordering and review of laboratory studies, ordering and review of radiographic studies, pulse oximetry, re-evaluation of patient's condition, obtaining history from patient or surrogate and review of old charts     Initial Impression / Assessment and Plan / ED Course  85 y.o. female who presents to the ED for generalized weakness, tachycardia concerning for atrial tachycardia vs flutter with 2:1 block.  On exam she is tachycardic, but otherwise hemodynamically stable.  Initial heart rate stays stable in the mid 120s.  Even after fluids, her heart rate remained steadily in the mid 120s, even more so suggestive of atrial flutter with block.  After dose of diltiazem,  no significant response in heart rate.  Will start on diltiazem drip.  Will obtain basic lab work, including electrolytes, thyroid studies to evaluate for underlying etiology of her arrhythmia.  Electrolytes without actionable derangements.  Free T4 minimally elevated.  No evidence of urine infection.  High-sensitivity troponin negative.  Patient on diltiazem drip, will titrate for rate control.  Discussed with hospitalist for admission.  Final Clinical Impression(s) / ED Diagnosis  Final diagnoses:  Generalized weakness  Tachycardia     Note:  This document was prepared using Dragon voice recognition software and may include unintentional dictation errors.   Lilia Pro., MD 05/07/19 1524

## 2019-05-07 NOTE — Consult Note (Addendum)
ANTICOAGULATION CONSULT NOTE - Initial Consult  Pharmacy Consult for Heparin Drip Indication: atrial fibrillation  Allergies  Allergen Reactions  . Amoxicillin Diarrhea  . Penicillins Rash    Patient Measurements: Height: 5\' 6"  (167.6 cm) Weight: 190 lb (86.2 kg) IBW/kg (Calculated) : 59.3 Heparin Dosing Weight: 77.7 kg  Vital Signs: Temp: 98 F (36.7 C) (09/22 1046) Temp Source: Oral (09/22 1046) BP: 141/91 (09/22 1400) Pulse Rate: 127 (09/22 1400)  Labs: Recent Labs    05/07/19 1047  HGB 13.5  HCT 40.9  PLT 255  APTT 29  LABPROT 13.1  INR 1.0  CREATININE 0.74  TROPONINIHS 6    Estimated Creatinine Clearance: 55.9 mL/min (by C-G formula based on SCr of 0.74 mg/dL).   Medical History: Past Medical History:  Diagnosis Date  . Abnormal mammogram, unspecified 2011  . Arthritis 2002  . GERD (gastroesophageal reflux disease)   . Heart murmur 1985  . Hyperlipidemia   . Hypertension 2005  . Hypothyroidism     Medications:  (Not in a hospital admission)  Scheduled:  . heparin  4,000 Units Intravenous Once   Infusions:  . diltiazem (CARDIZEM) infusion 12.5 mg/hr (05/07/19 1402)  . heparin     PRN:  Anti-infectives (From admission, onward)   None      Assessment: Pharmacy has been consulted to initiate Heparin Drip in 85yo patient that was sent to hospital after being found to have tachycardia during [place] annual visit. Patient doesn't not take any anticoagulant medications PTA. Baseline labs have been ordered and are normal. Will initiate therapy immediately.  Goal of Therapy:  Heparin level 0.3-0.7 units/ml Monitor platelets by anticoagulation protocol: Yes   Plan:  Give 4000 units bolus x 1 Start heparin infusion at 1150 units/hr Check anti-Xa level in 8 hours(9/23@0000 ) and daily while on heparin Continue to monitor H&H and platelets  Ansel Ferrall A Jese Comella 05/07/2019,2:13 PM

## 2019-05-07 NOTE — ED Notes (Signed)
In/Out done. [place] RN at bedside as a witness. Pt tolerated well.

## 2019-05-07 NOTE — ED Notes (Signed)
ED Provider, Qing  at bedside.

## 2019-05-07 NOTE — H&P (Signed)
[facility] - [place] at [facility]   PATIENT NAME: Meagan Wilcox    MR#:  [id number]  DATE OF BIRTH:  07/04/1931  DATE OF ADMISSION:  05/07/2019  PRIMARY CARE PHYSICIAN: Corky Downs, MD   REQUESTING/REFERRING PHYSICIAN: Dr. Colon Branch.  CHIEF COMPLAINT:   Chief Complaint  Patient presents with  . Tachycardia   Generalized weakness and palpitation. HISTORY OF PRESENT ILLNESS:  Meagan Wilcox  is a 85 y.o. female with a known history of hypertension, hyperlipidemia, hypothyroidism, GERD and arthritis.  The patient presents the ED with above chief complaints.  She denies any headache, dizziness, chest pain, shortness of breath or cough or wheezing.  She is found tachycardia and EKG showed a flutter with heart rate at 120s.  She is treated with Cardizem IV without improvement.  ED physician start Cardizem drip. PAST MEDICAL HISTORY:   Past Medical History:  Diagnosis Date  . Abnormal mammogram, unspecified 2011  . Arthritis 2002  . GERD (gastroesophageal reflux disease)   . Heart murmur 1985  . Hyperlipidemia   . Hypertension 2005  . Hypothyroidism     PAST SURGICAL HISTORY:   Past Surgical History:  Procedure Laterality Date  . APPENDECTOMY  1939  . BREAST BIOPSY Left 02-24-14   benign  . BREAST BIOPSY Left 02/11/2016   neg  . BREAST BIOPSY Left 2013   neg  . BREAST EXCISIONAL BIOPSY Left 2014   neg  . BREAST SURGERY Left 2013   stereotactic biopsy   . BREAST SURGERY Left May 2010   1 mm foci of atypical ductal hyperplasia  . cataract surgery  Bilateral 2009  . COLONOSCOPY  2010   Dr. Mechele Collin  . left breast biopsy   2009    SOCIAL HISTORY:   Social History   Tobacco Use  . Smoking status: Never Smoker  . Smokeless tobacco: Never Used  Substance Use Topics  . Alcohol use: No    Alcohol/week: 0.0 standard drinks    FAMILY HISTORY:   Family History  Problem Relation Age of Onset  . Heart disease Mother   . Heart disease Father   .  Diabetes Father   . Stroke Father   . Arthritis Father     DRUG ALLERGIES:   Allergies  Allergen Reactions  . Amoxicillin Diarrhea  . Penicillins Rash    REVIEW OF SYSTEMS:   Review of Systems  Constitutional: Positive for malaise/fatigue. Negative for chills and fever.  HENT: Negative for sore throat.   Eyes: Negative for blurred vision and double vision.  Respiratory: Negative for cough, hemoptysis, shortness of breath, wheezing and stridor.   Cardiovascular: Positive for palpitations. Negative for chest pain, orthopnea and leg swelling.  Gastrointestinal: Negative for abdominal pain, blood in stool, diarrhea, melena, nausea and vomiting.  Genitourinary: Negative for dysuria, flank pain and hematuria.  Musculoskeletal: Negative for back pain and joint pain.  Skin: Negative for rash.  Neurological: Negative for dizziness, sensory change, focal weakness, seizures, loss of consciousness, weakness and headaches.  Endo/Heme/Allergies: Negative for polydipsia.  Psychiatric/Behavioral: Negative for depression. The patient is not nervous/anxious.     MEDICATIONS AT HOME:   Prior to Admission medications   Medication Sig Start Date End Date Taking? Authorizing Provider  amitriptyline (ELAVIL) 25 MG tablet Take 25 mg by mouth at bedtime.   Yes [provider]  aspirin 81 MG EC tablet Take 81 mg by mouth daily.    Yes [provider]  Biotin 10 MG TABS Take  10 mg by mouth daily.    Yes [provider]  diazepam (VALIUM) 5 MG tablet Take 5 mg by mouth daily as needed for anxiety.    Yes [provider]  levothyroxine (SYNTHROID, LEVOTHROID) 100 MCG tablet Take 100 mcg by mouth daily.   Yes [provider]  losartan-hydrochlorothiazide (HYZAAR) 100-12.5 MG tablet Take 1 tablet daily by mouth.   Yes [provider]  omeprazole (PRILOSEC) 20 MG capsule Take 20 mg by mouth daily.    Yes [provider]  pentosan polysulfate  (ELMIRON) 100 MG capsule Take 100 mg by mouth 3 (three) times daily.    Yes [provider]  simvastatin (ZOCOR) 20 MG tablet Take 20 mg by mouth every evening.   Yes [provider]      VITAL SIGNS:  Blood pressure (!) 141/91, pulse (!) 127, temperature 98 F (36.7 C), temperature source Oral, resp. rate 17, height 5\' 6"  (1.676 m), weight 86.2 kg, SpO2 98 %.  PHYSICAL EXAMINATION:  Physical Exam  GENERAL:  85 y.o.-year-old patient lying in the bed with no acute distress.  EYES: Pupils equal, round, reactive to light and accommodation. No scleral icterus. Extraocular muscles intact.  HEENT: Head atraumatic, normocephalic. Oropharynx and nasopharynx clear.  NECK:  Supple, no jugular venous distention. No thyroid enlargement, no tenderness.  LUNGS: Normal breath sounds bilaterally, no wheezing, rales,rhonchi or crepitation. No use of accessory muscles of respiration.  CARDIOVASCULAR: S1, S2 normal. No murmurs, rubs, or gallops.  ABDOMEN: Soft, nontender, nondistended. Bowel sounds present. No organomegaly or mass.  EXTREMITIES: No pedal edema, cyanosis, or clubbing.  NEUROLOGIC: Cranial nerves II through XII are intact. Muscle strength 5/5 in all extremities. Sensation intact. Gait not checked.  PSYCHIATRIC: The patient is alert and oriented x 3.  SKIN: No obvious rash, lesion, or ulcer.   LABORATORY PANEL:   CBC Recent Labs  Lab 05/07/19 1047  WBC 7.8  HGB 13.5  HCT 40.9  PLT 255   ------------------------------------------------------------------------------------------------------------------  Chemistries  Recent Labs  Lab 05/07/19 1047  NA 139  K 4.0  CL 103  CO2 25  GLUCOSE 116*  BUN 18  CREATININE 0.74  CALCIUM 10.2  MG 1.9  AST 21  ALT 17  ALKPHOS 49  BILITOT 0.6   ------------------------------------------------------------------------------------------------------------------  Cardiac Enzymes No results for input(s): TROPONINI in the  last 168 hours. ------------------------------------------------------------------------------------------------------------------  RADIOLOGY:  No results found.    IMPRESSION AND PLAN:   A flutter. The patient will be admitted on telemetry floor. Continue telemetry monitor, continue Cardizem IV, start heparin drip, echocardiograph and cardiology consult.  Hypertension.  Continue Hyzaar. Hypothyroidism.  Continue Synthyroid. I informed Dr. Saunders Revel. All the records are reviewed and case discussed with ED provider. Management plans discussed with the patient, her niece and they are in agreement.  CODE STATUS: Full code.  TOTAL TIME TAKING CARE OF THIS PATIENT: 52 minutes.    Demetrios Loll M.D on 05/07/2019 at 2:05 PM  Between 7am to 6pm - Pager - [phone]  After 6pm go to [email or web address] - Proofreader  [facility]  Hospitalists  Office  [phone]  CC: Primary care physician; Cletis Athens, MD   Note: This dictation was prepared with Dragon dictation along with smaller phrase technology. Any transcriptional errors that result from this process are unin

## 2019-05-07 NOTE — ED Triage Notes (Signed)
Pt in via [facility] from home; was having home health annual visit, sent here for tachycardia.  Reports feeling anxious about visit.  Pt denies any pain, denies fever.  NAD noted at this time.

## 2019-05-07 NOTE — Progress Notes (Signed)
Advanced Care Plan.  Purpose of Encounter: CODE STATUS. Parties in Attendance: The patient, her niece and me. Patient's Decisional Capacity: Yes. Medical Story: Meagan Wilcox  is a 85 y.o. female with a known history of hypertension, hyperlipidemia, hypothyroidism, GERD and arthritis.  The patient is being admitted for new onset a flutter.  I discussed with the patient about her current condition, prognosis and CODE STATUS.  The patient does want to be resuscitated and intubated if she has cardiopulmonary arrest but does not want on ventilation long time. Plan:  Code Status: Full code. Time spent discussing advance care planning: 18 minutes.

## 2019-05-07 NOTE — Consult Note (Signed)
Cardiology Consultation:   Patient ID: ROSALBA TOTTY MRN: [id number]; DOB: 12/12/1930  Admit date: 05/07/2019 Date of Consult: 05/07/2019  Primary Care Provider: Cletis Athens, MD Primary Cardiologist: New - Darcia Lampi Primary Electrophysiologist:  None    Patient Profile:   Meagan Wilcox is a 85 y.o. female with a hx of hypertension, hyperlipidemia, hypothyroidism, GERD, and arthritis who is being seen today for the evaluation of atrial flutter at the request of Dr. Bridgett Larsson.  History of Present Illness:   Meagan Wilcox reports that she has been fatigued for the last 3 weeks.  She denies chest pain and shortness of breath but has experienced intermittent palpitations as well as mild leg edema.  She has not had any orthopnea.  A nurse from [facility] came to her home today for an annual assessment and noted Meagan Wilcox to be tachycardic.  He called EMS and had her transported to [facility] for further evaluation.  Here, she was noted to be tachycardic with EKG showing atrial flutter.  She was given IV diltiazem followed by diltiazem infusion.  She is also placed on heparin.  At this time she feels well, though her heart rate has remained at about 130 bpm.  Meagan Wilcox denies a history of prior cardiac disease other than possible mitral valve prolapse diagnosed at least 15 years ago by her gynecologist.  She is quite active and lives by herself.  She is able to perform all of her ADLs.  Heart Pathway Score:     Past Medical History:  Diagnosis Date  . Abnormal mammogram, unspecified 2011  . Arthritis 2002  . GERD (gastroesophageal reflux disease)   . Heart murmur 1985  . Hyperlipidemia   . Hypertension 2005  . Hypothyroidism     Past Surgical History:  Procedure Laterality Date  . APPENDECTOMY  1939  . BREAST BIOPSY Left 02-24-14   benign  . BREAST BIOPSY Left 02/11/2016   neg  . BREAST BIOPSY Left 2013   neg  . BREAST EXCISIONAL BIOPSY Left 2014   neg  . BREAST SURGERY Left 2013   stereotactic biopsy   . BREAST SURGERY Left May 2010   1 mm foci of atypical ductal hyperplasia  . cataract surgery  Bilateral 2009  . COLONOSCOPY  2010   Dr. Vira Agar  . left breast biopsy   2009     Home Medications:  Prior to Admission medications   Medication Sig Start Date Shanetra Blumenstock Date Taking? Authorizing Provider  amitriptyline (ELAVIL) 25 MG tablet Take 25 mg by mouth at bedtime.   Yes [provider]  aspirin 81 MG EC tablet Take 81 mg by mouth daily.    Yes [provider]  Biotin 10 MG TABS Take 10 mg by mouth daily.    Yes [provider]  diazepam (VALIUM) 5 MG tablet Take 5 mg by mouth daily as needed for anxiety.    Yes [provider]  levothyroxine (SYNTHROID, LEVOTHROID) 100 MCG tablet Take 100 mcg by mouth daily.   Yes [provider]  losartan-hydrochlorothiazide (HYZAAR) 100-12.5 MG tablet Take 1 tablet daily by mouth.   Yes [provider]  omeprazole (PRILOSEC) 20 MG capsule Take 20 mg by mouth daily.    Yes [provider]  pentosan polysulfate (ELMIRON) 100 MG capsule Take 100 mg by mouth 3 (three) times daily.    Yes [provider]  simvastatin (ZOCOR) 20 MG tablet Take 20 mg by mouth every evening.   Yes [provider]    Inpatient Medications: Scheduled Meds: . metoprolol tartrate  12.5 mg Oral Q6H   Continuous Infusions: . diltiazem (CARDIZEM) infusion 15 mg/hr (05/07/19 1510)  . heparin 1,150 Units/hr (05/07/19 1555)   PRN Meds: acetaminophen **OR** acetaminophen  Allergies:    Allergies  Allergen Reactions  . Amoxicillin Diarrhea  . Penicillins Rash    Social History:   Social History   Tobacco Use  . Smoking status: Never Smoker  . Smokeless tobacco: Never Used  Substance Use Topics  . Alcohol use: No    Alcohol/week: 0.0 standard drinks  . Drug use: No    Family History:   Family History  Problem Relation Age of Onset  . Heart disease Mother   . Heart  disease Father   . Diabetes Father   . Stroke Father   . Arthritis Father      ROS:  Please see the history of present illness. All other ROS reviewed and negative.     Physical Exam/Data:   Vitals:   05/07/19 1500 05/07/19 1520 05/07/19 1603 05/07/19 1630  BP: (!) 162/99 (!) 143/91 (!) 150/90 (!) 142/82  Pulse: (!) 128 (!) 128 (!) 128 (!) 118  Resp: 16 20 16 12   Temp:      TempSrc:      SpO2: 96% 97% 97% 94%  Weight:      Height:        Intake/Output Summary (Last 24 hours) at 05/07/2019 1715 Last data filed at 05/07/2019 1620 Gross per 24 hour  Intake -  Output 810 ml  Net -810 ml   Last 3 Weights 05/07/2019 09/27/2018 08/28/2018  Weight (lbs) 190 lb 165 lb 163 lb  Weight (kg) 86.183 kg 74.844 kg 73.936 kg     Body mass index is 30.67 kg/m.  General:  Well nourished, well developed, in no acute distress.  She is accompanied by her niece. HEENT: normal Lymph: no adenopathy Neck: no JVD Endocrine:  No thryomegaly Vascular: No carotid bruits; FA pulses 2+ bilaterally without bruits  Cardiac: Tachycardic but regular without murmurs or rubs. Lungs:  clear to auscultation bilaterally, no wheezing, rhonchi or rales  Abd: soft, nontender, no hepatomegaly  Ext: no edema Musculoskeletal:  No deformities, BUE and BLE strength normal and equal Skin: warm and dry  Neuro:  CNs 2-12 intact, no focal abnormalities noted Psych:  Normal affect   EKG:  The EKG (10:44 AM today) was personally reviewed and demonstrates:  Atypical atrial flutter versus atrial tachycardia with 2:1 AV block, left axis deviation, and non-specific ST/T changes. Telemetry:  Telemetry was personally reviewed and demonstrates: Atrial flutter versus atrial tachycardia with 2:1 AV block and occasional PVCs.  Relevant CV Studies: None.  Laboratory Data:  High Sensitivity Troponin:   Recent Labs  Lab 05/07/19 1047 05/07/19 1358  TROPONINIHS 6 7     Chemistry Recent Labs  Lab 05/07/19 1047  NA 139   K 4.0  CL 103  CO2 25  GLUCOSE 116*  BUN 18  CREATININE 0.74  CALCIUM 10.2  GFRNONAA >60  GFRAA >60  ANIONGAP 11    Recent Labs  Lab 05/07/19 1047  PROT 7.6  ALBUMIN 4.5  AST 21  ALT 17  ALKPHOS 49  BILITOT 0.6   Hematology Recent Labs  Lab 05/07/19 1047  WBC 7.8  RBC 4.44  HGB 13.5  HCT 40.9  MCV 92.1  MCH 30.4  MCHC 33.0  RDW 13.6  PLT 255   BNPNo results  for input(s): BNP, PROBNP in the last 168 hours.  DDimer No results for input(s): DDIMER in the last 168 hours.   Radiology/Studies:  Dg Chest 1 View  Result Date: 05/07/2019 CLINICAL DATA:  Pt in via [facility] from home; was having home health annual visit, sent here for tachycardia. Reports feeling anxious about visit, history hypertension, heart murmur, nonsmoker EXAM: CHEST  1 VIEW COMPARISON:  none FINDINGS: Coarse bronchovascular markings without focal infiltrate or overt edema. Heart size upper limits normal for technique. No pneumothorax. No effusion. Visualized bones unremarkable. IMPRESSION: No acute cardiopulmonary disease. Electronically Signed   By: Corlis Leak M.D.   On: 05/07/2019 14:12    Assessment and Plan:   Atrial flutter versus atrial tachycardia: EKG is most consistent with atypical atrial flutter though atrial tachycardia cannot be excluded.  Heart rate has remained elevated despite diltiazem bolus and infusion.  Continue diltiazem infusion for now.  Start metoprolol tartrate 12.5 mg PO every 6 hours, with uptitration as heart rate and blood pressure allow (goal resting HR < 110 bpm).  Continue heparin infusion with plan to transition to NOAC tomorrow.  Aspirin can be discontinued once [facility] has been started.  TSH and free T4 both elevated; defer thyroid management to internal medicine, as hyperthyroidism can precipitate/worsen tachyarrhythmias.  If patient remains in this rhythm, we will proceed with TEE/cardioversion tomorrow.  I discussed the procedures with the patient and her niece,  including the risks and benefits.  They have agreed to proceed.  Meagan Wilcox should be n.p.o. after midnight tonight.  Follow-up transthoracic echocardiogram.  Hypertension: Blood pressure mildly elevated.  Hold losartan-HCTZ at this time to allow for titration of rate controlling medications, if necessary    For questions or updates, please contact [facility] Please consult [email or web address] for contact info under [facility] Cardiology.  Signed, Yvonne Kendall, MD  05/07/2019 5:15 PM

## 2019-05-07 NOTE — ED Notes (Signed)
ED TO INPATIENT HANDOFF REPORT  ED Nurse Name and Phone #: [phone]  S Name/Age/Gender Meagan Wilcox 85 y.o. female Room/Bed: ED13A/ED13A  Code Status   Code Status: Not on file  Home/SNF/Other Home A/Ox4 Is this baseline? Yes   Triage Complete: Triage complete  Chief Complaint heart problem  Triage Note Pt in via [facility] from home; was having home health annual visit, sent here for tachycardia.  Reports feeling anxious about visit.  Pt denies any pain, denies fever.  NAD noted at this time.   Allergies Allergies  Allergen Reactions  . Amoxicillin Diarrhea  . Penicillins Rash    Level of Care/Admitting Diagnosis ED Disposition    ED Disposition Condition Comment   Admit  Hospital Area: [facility] [[id number]]  Level of Care: Telemetry [5]  Covid Evaluation: Asymptomatic Screening Protocol (No Symptoms)  Diagnosis: Atrial flutter (HCC) [427.32.ICD-9-CM]  Admitting Physician: Shaune Pollack [[id number]]  Attending Physician: Shaune Pollack [[id number]]  Estimated length of stay: past midnight tomorrow  Certification:: I certify this patient will need inpatient services for at least 2 midnights  PT Class (Do Not Modify): Inpatient [101]  PT Acc Code (Do Not Modify): Private [1]       B Medical/Surgery History Past Medical History:  Diagnosis Date  . Abnormal mammogram, unspecified 2011  . Arthritis 2002  . GERD (gastroesophageal reflux disease)   . Heart murmur 1985  . Hyperlipidemia   . Hypertension 2005  . Hypothyroidism    Past Surgical History:  Procedure Laterality Date  . APPENDECTOMY  1939  . BREAST BIOPSY Left 02-24-14   benign  . BREAST BIOPSY Left 02/11/2016   neg  . BREAST BIOPSY Left 2013   neg  . BREAST EXCISIONAL BIOPSY Left 2014   neg  . BREAST SURGERY Left 2013   stereotactic biopsy   . BREAST SURGERY Left May 2010   1 mm foci of atypical ductal hyperplasia  . cataract surgery  Bilateral 2009  . COLONOSCOPY  2010   Dr. Mechele Collin  .  left breast biopsy   2009     A IV Location/Drains/Wounds Patient Lines/Drains/Airways Status   Active Line/Drains/Airways    Name:   Placement date:   Placement time:   Site:   Days:   Peripheral IV 05/07/19 Left Antecubital   05/07/19    1124    Antecubital   less than 1          Intake/Output Last 24 hours No intake or output data in the 24 hours ending 05/07/19 1405  Labs/Imaging Results for orders placed or performed during the hospital encounter of 05/07/19 (from the past 48 hour(s))  Comprehensive metabolic panel     Status: Abnormal   Collection Time: 05/07/19 10:47 AM  Result Value Ref Range   Sodium 139 135 - 145 mmol/L   Potassium 4.0 3.5 - 5.1 mmol/L   Chloride 103 98 - 111 mmol/L   CO2 25 22 - 32 mmol/L   Glucose, Bld 116 (H) 70 - 99 mg/dL   BUN 18 8 - 23 mg/dL   Creatinine, Ser 0.01 0.44 - 1.00 mg/dL   Calcium 74.9 8.9 - 44.9 mg/dL   Total Protein 7.6 6.5 - 8.1 g/dL   Albumin 4.5 3.5 - 5.0 g/dL   AST 21 15 - 41 U/L   ALT 17 0 - 44 U/L   Alkaline Phosphatase 49 38 - 126 U/L   Total Bilirubin 0.6 0.3 - 1.2 mg/dL   GFR calc  non Af Amer >60 >60 mL/min   GFR calc Af Amer >60 >60 mL/min   Anion gap 11 5 - 15    Comment: Performed at [facility], [address] [place], [place] [address]  CBC with Differential     Status: None   Collection Time: 05/07/19 10:47 AM  Result Value Ref Range   WBC 7.8 4.0 - 10.5 K/uL   RBC 4.44 3.87 - 5.11 MIL/uL   Hemoglobin 13.5 12.0 - 15.0 g/dL   HCT 53.6 46.8 - 03.2 %   MCV 92.1 80.0 - 100.0 fL   MCH 30.4 26.0 - 34.0 pg   MCHC 33.0 30.0 - 36.0 g/dL   RDW 12.2 48.2 - 50.0 %   Platelets 255 150 - 400 K/uL   nRBC 0.0 0.0 - 0.2 %   Neutrophils Relative % 54 %   Neutro Abs 4.3 1.7 - 7.7 K/uL   Lymphocytes Relative 34 %   Lymphs Abs 2.6 0.7 - 4.0 K/uL   Monocytes Relative 9 %   Monocytes Absolute 0.7 0.1 - 1.0 K/uL   Eosinophils Relative 1 %   Eosinophils Absolute 0.1 0.0 - 0.5 K/uL   Basophils Relative 1 %    Basophils Absolute 0.1 0.0 - 0.1 K/uL   Immature Granulocytes 1 %   Abs Immature Granulocytes 0.04 0.00 - 0.07 K/uL    Comment: Performed at [facility], [address] [place], [place] [address]  Protime-INR     Status: None   Collection Time: 05/07/19 10:47 AM  Result Value Ref Range   Prothrombin Time 13.1 11.4 - 15.2 seconds   INR 1.0 0.8 - 1.2    Comment: (NOTE) INR goal varies based on device and disease states. Performed at [facility], [address] [place], [place] [address]   Troponin I (High Sensitivity)     Status: None   Collection Time: 05/07/19 10:47 AM  Result Value Ref Range   Troponin I (High Sensitivity) 6 <18 ng/L    Comment: (NOTE) Elevated high sensitivity troponin I (hsTnI) values and significant  changes across serial measurements may suggest ACS but many other  chronic and acute conditions are known to elevate hsTnI results.  Refer to the "Links" section for chest pain algorithms and additional  guidance. Performed at [facility], [address] [place], [place] [address]   APTT     Status: None   Collection Time: 05/07/19 10:47 AM  Result Value Ref Range   aPTT 29 24 - 36 seconds    Comment: Performed at [facility], [address] [place], [place] [address]  Magnesium     Status: None   Collection Time: 05/07/19 10:47 AM  Result Value Ref Range   Magnesium 1.9 1.7 - 2.4 mg/dL    Comment: Performed at [facility], [address] [place], [place] [address]  Phosphorus     Status: None   Collection Time: 05/07/19 10:47 AM  Result Value Ref Range   Phosphorus 3.9 2.5 - 4.6 mg/dL    Comment: Performed at [facility], [address] [place], [place] [address]  TSH     Status: Abnormal   Collection Time: 05/07/19 10:47 AM  Result Value Ref Range   TSH 5.369 (H) 0.350 - 4.500 uIU/mL    Comment: Performed by a 3rd Generation assay with a functional sensitivity of <=0.01  uIU/mL. Performed at [facility], [address] [place], [place] [address]  T4, free     Status: Abnormal   Collection Time: 05/07/19 10:47 AM  Result Value Ref Range   Free T4 1.64 (H) 0.61 - 1.12 ng/dL    Comment: (NOTE) Biotin ingestion may interfere with free T4 tests. If the results are inconsistent with the TSH level, previous test results, or the clinical presentation, then consider biotin interference. If needed, order repeat testing after stopping biotin. Performed at [facility], [address] [place], [place] [address]   Urinalysis, Complete w Microscopic     Status: Abnormal   Collection Time: 05/07/19 10:59 AM  Result Value Ref Range   Color, Urine COLORLESS (A) YELLOW   APPearance CLEAR (A) CLEAR   Specific Gravity, Urine 1.003 (L) 1.005 - 1.030   pH 5.0 5.0 - 8.0   Glucose, UA NEGATIVE NEGATIVE mg/dL   Hgb urine dipstick NEGATIVE NEGATIVE   Bilirubin Urine NEGATIVE NEGATIVE   Ketones, ur NEGATIVE NEGATIVE mg/dL   Protein, ur NEGATIVE NEGATIVE mg/dL   Nitrite NEGATIVE NEGATIVE   Leukocytes,Ua NEGATIVE NEGATIVE   WBC, UA NONE SEEN 0 - 5 WBC/hpf   Bacteria, UA NONE SEEN NONE SEEN   Squamous Epithelial / LPF NONE SEEN 0 - 5    Comment: Performed at [facility], [address], [place], [place] [address]   No results found.  Pending Labs [employer] (From admission, onward)    Start     Ordered   05/07/19 1336  SARS CORONAVIRUS 2 (TAT 6-24 HRS) Nasopharyngeal Nasopharyngeal Swab  (Asymptomatic/Tier 2 Patients Labs)  ONCE - STAT,   STAT    Question Answer Comment  Is this test for diagnosis or screening Screening   Symptomatic for COVID-19 as defined by CDC No   Hospitalized for COVID-19 No   Admitted to ICU for COVID-19 No   Previously tested for COVID-19 No   Resident in a congregate (group) care setting No   Employed in healthcare setting No   Pregnant No      05/07/19 1335   05/07/19 1330  T3, free  Once,   R      05/07/19 1330   05/07/19 1059  Urine culture  Once,   STAT     05/07/19 1059   Signed and Held  Basic metabolic panel  Tomorrow morning,   R     Signed and Held   Signed and Held  CBC  Tomorrow morning,   R     Signed and Held          Vitals/Pain Today's Vitals   05/07/19 1230 05/07/19 1300 05/07/19 1330 05/07/19 1400  BP: (!) 143/89 136/88 (!) 141/90 (!) 141/91  Pulse:    (!) 127  Resp: 15 14 17 17   Temp:      TempSrc:      SpO2:    98%  Weight:      Height:      PainSc:        Isolation Precautions No active isolations  Medications Medications  diltiazem (CARDIZEM) 100 mg in dextrose 5 % 100 mL (1 mg/mL) infusion (12.5 mg/hr Intravenous Rate/Dose Change 05/07/19 1402)  sodium chloride 0.9 % bolus 500 mL (0 mLs Intravenous Stopped 05/07/19 1233)  diltiazem (CARDIZEM) injection 10 mg (10 mg Intravenous Given 05/07/19 1121)  sodium chloride 0.9 % bolus 500 mL (500 mLs Intravenous New Bag/Given 05/07/19 1404)    Mobility walks Low fall risk      R Recommendations: See Admitting Provider Note  Report given to:   Additional Notes:

## 2019-05-08 ENCOUNTER — Inpatient Hospital Stay: Payer: Medicare Other | Admitting: Anesthesiology

## 2019-05-08 ENCOUNTER — Inpatient Hospital Stay (HOSPITAL_COMMUNITY)
Admit: 2019-05-08 | Discharge: 2019-05-08 | Disposition: A | Discharge status: Home / Self Care | Payer: Medicare Other | Attending: Physician Assistant | Admitting: Physician Assistant

## 2019-05-08 ENCOUNTER — Encounter
Admission: EM | Disposition: A | Discharge status: Home Health | Payer: Self-pay | Source: Home / Self Care | Attending: Internal Medicine

## 2019-05-08 DIAGNOSIS — I361 Nonrheumatic tricuspid (valve) insufficiency: Secondary | ICD-10-CM

## 2019-05-08 DIAGNOSIS — I4892 Unspecified atrial flutter: Secondary | ICD-10-CM

## 2019-05-08 DIAGNOSIS — I340 Nonrheumatic mitral (valve) insufficiency: Secondary | ICD-10-CM

## 2019-05-08 HISTORY — PX: CARDIOVERSION: SHX1299

## 2019-05-08 HISTORY — PX: TEE WITHOUT CARDIOVERSION: SHX5443

## 2019-05-08 LAB — CBC
HCT: 37.1 % (ref 36.0–46.0)
Hemoglobin: 12.4 g/dL (ref 12.0–15.0)
MCH: 30.3 pg (ref 26.0–34.0)
MCHC: 33.4 g/dL (ref 30.0–36.0)
MCV: 90.7 fL (ref 80.0–100.0)
Platelets: 228 10*3/uL (ref 150–400)
RBC: 4.09 MIL/uL (ref 3.87–5.11)
RDW: 13.4 % (ref 11.5–15.5)
WBC: 8.8 10*3/uL (ref 4.0–10.5)
nRBC: 0 % (ref 0.0–0.2)

## 2019-05-08 LAB — BASIC METABOLIC PANEL
Anion gap: 8 (ref 5–15)
BUN: 18 mg/dL (ref 8–23)
CO2: 25 mmol/L (ref 22–32)
Calcium: 9.2 mg/dL (ref 8.9–10.3)
Chloride: 106 mmol/L (ref 98–111)
Creatinine, Ser: 0.66 mg/dL (ref 0.44–1.00)
GFR calc Af Amer: >60 mL/min (ref >60)
GFR calc non Af Amer: >60 mL/min (ref >60)
Glucose, Bld: 121 mg/dL — ABNORMAL HIGH (ref 70–99)
Potassium: 3.7 mmol/L (ref 3.5–5.1)
Sodium: 139 mmol/L (ref 135–145)

## 2019-05-08 LAB — URINE CULTURE: Culture: NO GROWTH

## 2019-05-08 LAB — HEPARIN LEVEL (UNFRACTIONATED)
Heparin Unfractionated: 0.81 IU/mL — ABNORMAL HIGH (ref 0.30–0.70)
Heparin Unfractionated: 0.79 IU/mL — ABNORMAL HIGH (ref 0.30–0.70)

## 2019-05-08 LAB — SARS CORONAVIRUS 2 (TAT 6-24 HRS): SARS Coronavirus 2: NEGATIVE

## 2019-05-08 LAB — T3, FREE: T3, Free: 2.6 pg/mL (ref 2.0–4.4)

## 2019-05-08 SURGERY — CARDIOVERSION
Anesthesia: General

## 2019-05-08 SURGERY — ECHOCARDIOGRAM, TRANSESOPHAGEAL
Anesthesia: General

## 2019-05-08 MED ORDER — EPHEDRINE SULFATE 50 MG/ML IJ SOLN
INTRAMUSCULAR | Status: DC | PRN
  Administered 2019-05-08: 10 mg via INTRAVENOUS

## 2019-05-08 MED ORDER — GLYCOPYRROLATE 0.2 MG/ML IJ SOLN
INTRAMUSCULAR | Status: DC | PRN
  Administered 2019-05-08: 0.2 mg via INTRAVENOUS

## 2019-05-08 MED ORDER — SODIUM CHLORIDE 0.9 % IV SOLN: 250.0000 mL | INTRAVENOUS | Status: DC

## 2019-05-08 MED ORDER — SODIUM CHLORIDE 0.9 % IV SOLN: INTRAVENOUS | Status: DC

## 2019-05-08 MED ORDER — APIXABAN 5 MG PO TABS
5.0000 mg | ORAL_TABLET | Freq: Two times a day (BID) | ORAL | Status: DC
  Administered 2019-05-08 – 2019-05-09 (×2): 5 mg via ORAL
  Filled 2019-05-08 (×2): qty 1

## 2019-05-08 MED ORDER — MENTHOL 3 MG MT LOZG
1.0000 | LOZENGE | OROMUCOSAL | Status: DC | PRN
  Filled 2019-05-08: qty 9

## 2019-05-08 MED ORDER — PROPOFOL 10 MG/ML IV BOLUS
INTRAVENOUS | Status: DC | PRN
  Administered 2019-05-08: 10 mg via INTRAVENOUS
  Administered 2019-05-08: 25 mg via INTRAVENOUS
  Administered 2019-05-08: 40 mg via INTRAVENOUS
  Administered 2019-05-08: 25 mg via INTRAVENOUS

## 2019-05-08 MED ORDER — SODIUM CHLORIDE 0.9% FLUSH: 3.0000 mL | INTRAVENOUS | Status: DC | PRN

## 2019-05-08 NOTE — Consult Note (Signed)
ANTICOAGULATION CONSULT NOTE - Initial Consult  Pharmacy Consult for Heparin Drip Indication: atrial fibrillation  Allergies  Allergen Reactions  . Amoxicillin Diarrhea  . Penicillins Rash    Patient Measurements: Height: 5\' 6"  (167.6 cm) Weight: 190 lb (86.2 kg) IBW/kg (Calculated) : 59.3 Heparin Dosing Weight: 77.7 kg  Vital Signs: Temp: 98.4 F (36.9 C) (09/22 1945) Temp Source: Oral (09/22 1945) BP: 110/53 (09/23 0000) Pulse Rate: 92 (09/23 0000)  Labs: Recent Labs    05/07/19 1047 05/07/19 1358 05/08/19 0011  HGB 13.5  --   --   HCT 40.9  --   --   PLT 255  --   --   APTT 29  --   --   LABPROT 13.1  --   --   INR 1.0  --   --   HEPARINUNFRC  --   --  0.81*  CREATININE 0.74  --   --   TROPONINIHS 6 7  --     Estimated Creatinine Clearance: 55.9 mL/min (by C-G formula based on SCr of 0.74 mg/dL).   Medical History: Past Medical History:  Diagnosis Date  . Abnormal mammogram, unspecified 2011  . Arthritis 2002  . GERD (gastroesophageal reflux disease)   . Heart murmur 1985  . Hyperlipidemia   . Hypertension 2005  . Hypothyroidism     Medications:  Medications Prior to Admission  Medication Sig Dispense Refill Last Dose  . amitriptyline (ELAVIL) 25 MG tablet Take 25 mg by mouth at bedtime.   05/06/2019 at 2000  . aspirin 81 MG EC tablet Take 81 mg by mouth daily.    05/06/2019 at Unknown time  . Biotin 10 MG TABS Take 10 mg by mouth daily.    05/06/2019 at Unknown time  . diazepam (VALIUM) 5 MG tablet Take 5 mg by mouth daily as needed for anxiety.    Unknown at PRN  . levothyroxine (SYNTHROID, LEVOTHROID) 100 MCG tablet Take 100 mcg by mouth daily.   05/07/2019 at 0600  . losartan-hydrochlorothiazide (HYZAAR) 100-12.5 MG tablet Take 1 tablet daily by mouth.   05/07/2019 at 0600  . omeprazole (PRILOSEC) 20 MG capsule Take 20 mg by mouth daily.    05/06/2019 at Unknown time  . pentosan polysulfate (ELMIRON) 100 MG capsule Take 100 mg by mouth 3 (three)  times daily.    05/07/2019 at 0600  . simvastatin (ZOCOR) 20 MG tablet Take 20 mg by mouth every evening.   05/06/2019 at 1700   Scheduled:  . amitriptyline  25 mg Oral QHS  . aspirin EC  81 mg Oral Daily  . losartan  100 mg Oral Daily   And  . hydrochlorothiazide  12.5 mg Oral Daily  . influenza vaccine adjuvanted  0.5 mL Intramuscular Tomorrow-1000  . levothyroxine  100 mcg Oral Q0600  . metoprolol tartrate  12.5 mg Oral Q6H  . pantoprazole  40 mg Oral Daily  . pentosan polysulfate  100 mg Oral TID  . simvastatin  20 mg Oral QPM  . sodium chloride flush  3 mL Intravenous Q12H   Infusions:  . sodium chloride    . diltiazem (CARDIZEM) infusion 12.5 mg/hr (05/08/19 0158)  . heparin 950 Units/hr (05/08/19 0207)   PRN:  Anti-infectives (From admission, onward)   None      Assessment: Pharmacy has been consulted to initiate Heparin Drip in 85yo patient that was sent to hospital after being found to have tachycardia during Home Health annual visit. Patient doesn't not take  any anticoagulant medications PTA. Baseline labs have been ordered and are normal. Will initiate therapy immediately.  Goal of Therapy:  Heparin level 0.3-0.7 units/ml Monitor platelets by anticoagulation protocol: Yes   Plan:  09/23 @ 0011 HL 0.81 supratherapeutic. No issues w/ bleeding per RN, will decrease rate to 950 units/hr and will recheck HL @ 1000, will continue to monitor and f/u w/ CBC.  Tobie Lords, PharmD, BCPS Clinical Pharmacist 05/08/2019,2:09 AM

## 2019-05-08 NOTE — Progress Notes (Signed)
[facility] NAME: Meagan Wilcox    MR#:  [id number]  DATE OF BIRTH:  1931/01/18  SUBJECTIVE:  CHIEF COMPLAINT: Patient was seen after procedure feeling tired as she did not sleep for 2 days.  Patient is resting comfortably reporting sore throat, niece at bedside  REVIEW OF SYSTEMS:  CONSTITUTIONAL: No fever, fatigue or weakness.  EYES: No blurred or double vision.  EARS, NOSE, AND THROAT: No tinnitus or ear pain.  Sore throat RESPIRATORY: No cough, shortness of breath, wheezing or hemoptysis.  CARDIOVASCULAR: No chest pain, orthopnea, edema.  GASTROINTESTINAL: No nausea, vomiting, diarrhea or abdominal pain.  GENITOURINARY: No dysuria, hematuria.  ENDOCRINE: No polyuria, nocturia,  HEMATOLOGY: No anemia, easy bruising or bleeding SKIN: No rash or lesion. MUSCULOSKELETAL: No joint pain or arthritis.   NEUROLOGIC: No tingling, numbness, weakness.  PSYCHIATRY: No anxiety or depression.   DRUG ALLERGIES:   Allergies  Allergen Reactions  . Amoxicillin Diarrhea  . Penicillins Rash    VITALS:  Blood pressure (!) 120/59, pulse 70, temperature 97.7 F (36.5 C), temperature source Oral, resp. rate 14, height 5\' 6"  (1.676 m), weight 76.4 kg, SpO2 99 %.  PHYSICAL EXAMINATION:  GENERAL:  85 y.o.-year-old patient lying in the bed with no acute distress.  EYES: Pupils equal, round, reactive to light and accommodation. No scleral icterus. Extraocular muscles intact.  HEENT: Head atraumatic, normocephalic. Oropharynx and nasopharynx clear.  NECK:  Supple, no jugular venous distention. No thyroid enlargement, no tenderness.  LUNGS: Normal breath sounds bilaterally, no wheezing, rales,rhonchi or crepitation. No use of accessory muscles of respiration.  CARDIOVASCULAR: S1, S2 normal. No murmurs, rubs, or gallops.  ABDOMEN: Soft, nontender, nondistended. Bowel sounds present.  EXTREMITIES: No pedal edema, cyanosis, or clubbing.   NEUROLOGIC: Cranial nerves II through XII are intact. Muscle strength 5/5 in all extremities. Sensation intact. Gait not checked.  PSYCHIATRIC: The patient is alert and oriented x 3.  SKIN: No obvious rash, lesion, or ulcer.    LABORATORY PANEL:   CBC Recent Labs  Lab 05/08/19 0516  WBC 8.8  HGB 12.4  HCT 37.1  PLT 228   ------------------------------------------------------------------------------------------------------------------  Chemistries  Recent Labs  Lab 05/07/19 1047 05/08/19 0516  NA 139 139  K 4.0 3.7  CL 103 106  CO2 25 25  GLUCOSE 116* 121*  BUN 18 18  CREATININE 0.74 0.66  CALCIUM 10.2 9.2  MG 1.9  --   AST 21  --   ALT 17  --   ALKPHOS 49  --   BILITOT 0.6  --    ------------------------------------------------------------------------------------------------------------------  Cardiac Enzymes No results for input(s): TROPONINI in the last 168 hours. ------------------------------------------------------------------------------------------------------------------  RADIOLOGY:  Dg Chest 1 View  Result Date: 05/07/2019 CLINICAL DATA:  Pt in via [facility] from home; was having home health annual visit, sent here for tachycardia. Reports feeling anxious about visit, history hypertension, heart murmur, nonsmoker EXAM: CHEST  1 VIEW COMPARISON:  none FINDINGS: Coarse bronchovascular markings without focal infiltrate or overt edema. Heart size upper limits normal for technique. No pneumothorax. No effusion. Visualized bones unremarkable. IMPRESSION: No acute cardiopulmonary disease. Electronically Signed   By: Lucrezia Europe M.D.   On: 05/07/2019 14:12    EKG:   Orders placed or performed during the hospital encounter of 05/07/19  . EKG 12-Lead  . EKG 12-Lead  . EKG 12-Lead  . EKG 12-Lead  . EKG 12-Lead  . EKG 12-Lead  . EKG 12-Lead  .  EKG 12-Lead  . EKG 12-Lead  . EKG 12-Lead  . EKG 12-Lead  . EKG 12-Lead  . EKG 12-Lead  . EKG 12-Lead     ASSESSMENT AND PLAN:   A flutter. Continue telemetry monitor Patient was on Cardizem drip and heparin drip  Status post TEE cardioversion converted back to sinus rhythm tolerated procedure well -Discontinue heparin drip and start the patient on Eliquis per pharmacy -Cardizem drip discontinued in view of bradycardia -Appreciate cardiology recommendations  Hypertension.  Continue Hyzaar.  Hypothyroidism.  Continue Synthyroid.  Sore throat from the procedure-Cepacol lozenges      All the records are reviewed and case discussed with Care Management/Social Workerr. Management plans discussed with the patient, family and they are in agreement.  CODE STATUS: fc   TOTAL TIME TAKING CARE OF THIS PATIENT: 35  minutes.   POSSIBLE D/C IN 1  DAYS, DEPENDING ON CLINICAL CONDITION.  Note: This dictation was prepared with Dragon dictation along with smaller phrase technology. Any transcriptional errors that result from this process are unintentional.   Ramonita Lab M.D on 05/08/2019 at 4:33 PM  Between 7am to 6pm - Pager - [phone] After 6pm go to [email or web address] - password [email or web address] [facility]  [facility]  Office  [phone]  CC: Primary care physician; Corky Downs, MD

## 2019-05-08 NOTE — Anesthesia Post-op Follow-up Note (Signed)
Anesthesia QCDR form completed.        

## 2019-05-08 NOTE — Progress Notes (Signed)
Per dr. Garen Lah patient can take scheduled metoprolol and may have clear liquids this am.

## 2019-05-08 NOTE — Progress Notes (Signed)
Transesophageal Echocardiogram :  Indication: atrial flutter  Procedure:The patient was positioned on the left side, bite block provided. The patient was sedated with the help of anesthesia team.  Using digital technique an omniplane probe was advanced into the esophagus without incident.    sedation: Administered as per anesthesia  See report in EPIC  for complete details: In brief, imaging revealed normal LV function with no RWMAs. Estimated ejection fraction was 55%.  .  Imaging of the septum showed no ASD or VSD  The LA was well visualized in orthogonal views.  There was no spontaneous contrast and no thrombus in the LA and LA appendage   The descending thoracic aorta had no  mural aortic debris with no evidence of aneurysmal dilation or disection   After TEE showed no thrombus, DC Cardioversion performed Cardioversion procedure note For atrial flutter.  Procedure Details: Time Out: Verified patient identification, verified procedure, site/side was marked, verified correct patient position, special equipment/implants available, medications/allergies/relevent history reviewed, required imaging and test results available.  Performed  Patient placed on cardiac monitor, pulse oximetry, supplemental oxygen as necessary.   Sedation given: propofol IV, Dr.  Gomez Cleverly pads placed anterior and posterior chest.   Cardioverted 1 time.   Cardioverted at  [place]. Synchronized biphasic Converted to Sinus bradycardia   Evaluation: Findings: Post procedure EKG shows: Sinus bradycardia with HR in the 25K Complications: None Patient did tolerate procedure well.  Time Spent Directly with the Patient:  45 minutes   Kate Sable, M.D. 05/08/2019 1:45 PM

## 2019-05-08 NOTE — Consult Note (Signed)
ANTICOAGULATION CONSULT NOTE - Initial Consult  Pharmacy Consult for Heparin Drip Indication: atrial fibrillation  Allergies  Allergen Reactions  . Amoxicillin Diarrhea  . Penicillins Rash    Patient Measurements: Height: 5\' 6"  (167.6 cm) Weight: 168 lb 8 oz (76.4 kg) IBW/kg (Calculated) : 59.3 Heparin Dosing Weight: 77.7 kg  Vital Signs: Temp: 98.5 F (36.9 C) (09/23 0529) Temp Source: Oral (09/23 0529) BP: 113/73 (09/23 1115) Pulse Rate: 64 (09/23 1115)  Labs: Recent Labs    05/07/19 1047 05/07/19 1358 05/08/19 0011 05/08/19 0516 05/08/19 0951  HGB 13.5  --   --  12.4  --   HCT 40.9  --   --  37.1  --   PLT 255  --   --  228  --   APTT 29  --   --   --   --   LABPROT 13.1  --   --   --   --   INR 1.0  --   --   --   --   HEPARINUNFRC  --   --  0.81*  --  0.79*  CREATININE 0.74  --   --  0.66  --   TROPONINIHS 6 7  --   --   --     Estimated Creatinine Clearance: 52.7 mL/min (by C-G formula based on SCr of 0.66 mg/dL).   Medical History: Past Medical History:  Diagnosis Date  . Abnormal mammogram, unspecified 2011  . Arthritis 2002  . GERD (gastroesophageal reflux disease)   . Heart murmur 1985  . Hyperlipidemia   . Hypertension 2005  . Hypothyroidism     Medications:  Medications Prior to Admission  Medication Sig Dispense Refill Last Dose  . amitriptyline (ELAVIL) 25 MG tablet Take 25 mg by mouth at bedtime.   05/06/2019 at 2000  . aspirin 81 MG EC tablet Take 81 mg by mouth daily.    05/06/2019 at Unknown time  . Biotin 10 MG TABS Take 10 mg by mouth daily.    05/06/2019 at Unknown time  . diazepam (VALIUM) 5 MG tablet Take 5 mg by mouth daily as needed for anxiety.    Unknown at PRN  . levothyroxine (SYNTHROID, LEVOTHROID) 100 MCG tablet Take 100 mcg by mouth daily.   05/07/2019 at 0600  . losartan-hydrochlorothiazide (HYZAAR) 100-12.5 MG tablet Take 1 tablet daily by mouth.   05/07/2019 at 0600  . omeprazole (PRILOSEC) 20 MG capsule Take 20 mg by  mouth daily.    05/06/2019 at Unknown time  . pentosan polysulfate (ELMIRON) 100 MG capsule Take 100 mg by mouth 3 (three) times daily.    05/07/2019 at 0600  . simvastatin (ZOCOR) 20 MG tablet Take 20 mg by mouth every evening.   05/06/2019 at 1700   Scheduled:  . amitriptyline  25 mg Oral QHS  . aspirin EC  81 mg Oral Daily  . losartan  100 mg Oral Daily   And  . hydrochlorothiazide  12.5 mg Oral Daily  . influenza vaccine adjuvanted  0.5 mL Intramuscular Tomorrow-1000  . levothyroxine  100 mcg Oral Q0600  . metoprolol tartrate  12.5 mg Oral Q6H  . pantoprazole  40 mg Oral Daily  . pentosan polysulfate  100 mg Oral TID  . simvastatin  20 mg Oral QPM  . sodium chloride flush  3 mL Intravenous Q12H   Infusions:  . sodium chloride    . sodium chloride    . sodium chloride    .  diltiazem (CARDIZEM) infusion 10 mg/hr (05/08/19 0921)  . heparin 900 Units/hr (05/08/19 1056)   PRN:  Anti-infectives (From admission, onward)   None      Assessment: Pharmacy has been consulted to initiate Heparin Drip in 85yo patient that was sent to hospital after being found to have tachycardia during Home Health annual visit. Patient doesn't not take any anticoagulant medications PTA. Baseline labs have been ordered and are normal. Will initiate therapy immediately.  9/23 0011 HL 0.81 decrease rate to 950 units/hr  9/23 0951 HL 0.79 decrease rate to 900 units/hr   Goal of Therapy:  Heparin level 0.3-0.7 units/ml Monitor platelets by anticoagulation protocol: Yes   Plan:  Heparin level is supratherapeutic. Will decrease rate to 900 units/hr and will recheck HL @ 1900, will continue to monitor and f/u w/ CBC.  Paschal Dopp, PharmD, BCPS Clinical Pharmacist 05/08/2019,11:36 AM

## 2019-05-08 NOTE — Transfer of Care (Signed)
Immediate Anesthesia Transfer of Care Note  Patient: Meagan Wilcox  Procedure(s) Performed: TRANSESOPHAGEAL ECHOCARDIOGRAM (TEE) (N/A ) CARDIOVERSION (N/A )  Patient Location: PACU  Anesthesia Type:General  Level of Consciousness: awake and sedated  Airway & Oxygen Therapy: Patient Spontanous Breathing and Patient connected to nasal cannula oxygen  Post-op Assessment: Report given to RN and Post -op Vital signs reviewed and stable  Post vital signs: Reviewed and stable  Last Vitals:  Vitals Value Taken Time  BP    Temp    Pulse    Resp    SpO2      Last Pain:  Vitals:   05/08/19 1233  TempSrc: Oral  PainSc: 2          Complications: No apparent anesthesia complications

## 2019-05-08 NOTE — Anesthesia Preprocedure Evaluation (Addendum)
Anesthesia Evaluation  Patient identified by MRN, date of birth, ID band Patient awake    Reviewed: Allergy & Precautions, H&P , NPO status , Patient's Chart, lab work & pertinent test results  Airway Mallampati: III  TM Distance: <3 FB    Comment: TM 2 FB Dental  (+) Teeth Intact   Pulmonary neg pulmonary ROS,           Cardiovascular hypertension, + dysrhythmias Atrial Fibrillation + Valvular Problems/Murmurs MVP      Neuro/Psych Lumbar radiculopathy negative psych ROS   GI/Hepatic Neg liver ROS, GERD  Controlled,  Endo/Other  Hypothyroidism   Renal/GU negative Renal ROS  negative genitourinary   Musculoskeletal   Abdominal   Peds  Hematology negative hematology ROS (+)   Anesthesia Other Findings Past Medical History: 2011: Abnormal mammogram, unspecified 2002: Arthritis No date: GERD (gastroesophageal reflux disease) 1985: Heart murmur No date: Hyperlipidemia 2005: Hypertension No date: Hypothyroidism  Past Surgical History: 1939: APPENDECTOMY 02-24-14: BREAST BIOPSY; Left     Comment:  benign 02/11/2016: BREAST BIOPSY; Left     Comment:  neg 2013: BREAST BIOPSY; Left     Comment:  neg 2014: BREAST EXCISIONAL BIOPSY; Left     Comment:  neg 2013: BREAST SURGERY; Left     Comment:  stereotactic biopsy  May 2010: BREAST SURGERY; Left     Comment:  1 mm foci of atypical ductal hyperplasia 2009: cataract surgery ; Bilateral 2010: COLONOSCOPY     Comment:  Dr. Vira Agar 2009: left breast biopsy   BMI    Body Mass Index: 27.20 kg/m      Reproductive/Obstetrics negative OB ROS                            Anesthesia Physical Anesthesia Plan  ASA: III  Anesthesia Plan: General   Post-op Pain Management:    Induction:   PONV Risk Score and Plan:   Airway Management Planned: Natural Airway and Nasal Cannula  Additional Equipment:   Intra-op Plan:   Post-operative  Plan:   Informed Consent: I have reviewed the patients History and Physical, chart, labs and discussed the procedure including the risks, benefits and alternatives for the proposed anesthesia with the patient or authorized representative who has indicated his/her understanding and acceptance.     Dental Advisory Given  Plan Discussed with: Anesthesiologist and CRNA  Anesthesia Plan Comments:         Anesthesia Quick Evaluation

## 2019-05-08 NOTE — Progress Notes (Signed)
Progress Note  Patient Name: Meagan Wilcox Date of Encounter: 05/08/2019  Primary Cardiologist: No primary care provider on file.   Subjective   Patient seen this a.m. states feeling great, denies chest pain, shortness of breath, palpitations.  Inpatient Medications    Scheduled Meds: . amitriptyline  25 mg Oral QHS  . aspirin EC  81 mg Oral Daily  . losartan  100 mg Oral Daily   And  . hydrochlorothiazide  12.5 mg Oral Daily  . influenza vaccine adjuvanted  0.5 mL Intramuscular Tomorrow-1000  . levothyroxine  100 mcg Oral Q0600  . metoprolol tartrate  12.5 mg Oral Q6H  . pantoprazole  40 mg Oral Daily  . pentosan polysulfate  100 mg Oral TID  . simvastatin  20 mg Oral QPM  . sodium chloride flush  3 mL Intravenous Q12H   Continuous Infusions: . sodium chloride    . sodium chloride    . sodium chloride    . diltiazem (CARDIZEM) infusion 10 mg/hr (05/08/19 0921)  . heparin 900 Units/hr (05/08/19 1056)   PRN Meds: sodium chloride, acetaminophen **OR** acetaminophen, albuterol, bisacodyl, diazepam, HYDROcodone-acetaminophen, ondansetron **OR** ondansetron (ZOFRAN) IV, senna-docusate, sodium chloride flush, sodium chloride flush   Vital Signs    Vitals:   05/08/19 1045 05/08/19 1100 05/08/19 1115 05/08/19 1200  BP: 96/84 123/64 113/73 114/77  Pulse: (!) 121 90 64 (!) 119  Resp:      Temp:      TempSrc:      SpO2:      Weight:      Height:        Intake/Output Summary (Last 24 hours) at 05/08/2019 1210 Last data filed at 05/08/2019 1129 Gross per 24 hour  Intake -  Output 1510 ml  Net -1510 ml   Last 3 Weights 05/08/2019 05/07/2019 09/27/2018  Weight (lbs) 168 lb 8 oz 190 lb 165 lb  Weight (kg) 76.431 kg 86.183 kg 74.844 kg      Telemetry    Atrial flutter heart rate in the 120s- Personally Reviewed  ECG    Atrial flutter- Personally Reviewed  Physical Exam   GEN: No acute distress.   Neck: No JVD Cardiac:  Regular, irregular, no murmurs, rubs, or  gallops.  Respiratory: Clear to auscultation bilaterally. GI: Soft, nontender, non-distended  MS: No edema; No deformity. Neuro:  Nonfocal  Psych: Normal affect   Labs    High Sensitivity Troponin:   Recent Labs  Lab 05/07/19 1047 05/07/19 1358  TROPONINIHS 6 7      Chemistry Recent Labs  Lab 05/07/19 1047 05/08/19 0516  NA 139 139  K 4.0 3.7  CL 103 106  CO2 25 25  GLUCOSE 116* 121*  BUN 18 18  CREATININE 0.74 0.66  CALCIUM 10.2 9.2  PROT 7.6  --   ALBUMIN 4.5  --   AST 21  --   ALT 17  --   ALKPHOS 49  --   BILITOT 0.6  --   GFRNONAA >60 >60  GFRAA >60 >60  ANIONGAP 11 8     Hematology Recent Labs  Lab 05/07/19 1047 05/08/19 0516  WBC 7.8 8.8  RBC 4.44 4.09  HGB 13.5 12.4  HCT 40.9 37.1  MCV 92.1 90.7  MCH 30.4 30.3  MCHC 33.0 33.4  RDW 13.6 13.4  PLT 255 228    BNPNo results for input(s): BNP, PROBNP in the last 168 hours.   DDimer No results for input(s): DDIMER in the  last 168 hours.   Radiology    Dg Chest 1 View  Result Date: 05/07/2019 CLINICAL DATA:  Pt in via [facility] from [facility]; was having home health annual visit, sent here for tachycardia. Reports feeling anxious about visit, history hypertension, heart murmur, nonsmoker EXAM: CHEST  1 VIEW COMPARISON:  none FINDINGS: Coarse bronchovascular markings without focal infiltrate or overt edema. Heart size upper limits normal for technique. No pneumothorax. No effusion. Visualized bones unremarkable. IMPRESSION: No acute cardiopulmonary disease. Electronically Signed   By: Lucrezia Europe M.D.   On: 05/07/2019 14:12    Cardiac Studies   TEE scheduled for today  Patient Profile     85 y.o. female with history of hypertension, hyperlipidemia who was feeling fatigued, and evaluation by home nurse revealed tachycardia.  She was brought to the hospital per EMS and noted to be in atrial fibrillation with rapid ventricular response.  She was started on IV diltiazem, and heparin infusion.  Assessment  & Plan    She continues to be in atrial flutter.  We will plan for cardioversion after TEE today.  Decision for TEE cardioversion discussed with her and she is in agreement.   1.  Atrial flutter -Plan for TEE/cardioversion today -Continue metoprolol 12.5 mg every 6 hours -Continue heparin drip -Plan to switch to oral anticoagulation upon discharge  2.  Hypertension -Blood pressure is normal to low normal -Continue to hold antihypertensive for now  For questions or updates, please contact [place] Please consult [email or web address] for contact info under        Signed, Kate Sable, MD  05/08/2019, 12:10 PM

## 2019-05-08 NOTE — Consult Note (Signed)
ANTICOAGULATION CONSULT NOTE - Initial Consult  Pharmacy Consult for Apixaban dosing Indication: Atrial Flutter  Allergies  Allergen Reactions  . Amoxicillin Diarrhea  . Penicillins Rash    Patient Measurements: Height: 5\' 6"  (167.6 cm) Weight: 168 lb 8 oz (76.4 kg) IBW/kg (Calculated) : 59.3 Heparin Dosing Weight: 77.7 kg  Vital Signs: Temp: 97.7 F (36.5 C) (09/23 1605) Temp Source: Oral (09/23 1605) BP: 112/50 (09/23 1657) Pulse Rate: 70 (09/23 1657)  Labs: Recent Labs    05/07/19 1047 05/07/19 1358 05/08/19 0011 05/08/19 0516 05/08/19 0951  HGB 13.5  --   --  12.4  --   HCT 40.9  --   --  37.1  --   PLT 255  --   --  228  --   APTT 29  --   --   --   --   LABPROT 13.1  --   --   --   --   INR 1.0  --   --   --   --   HEPARINUNFRC  --   --  0.81*  --  0.79*  CREATININE 0.74  --   --  0.66  --   TROPONINIHS 6 7  --   --   --     Estimated Creatinine Clearance: 52.7 mL/min (by C-G formula based on SCr of 0.66 mg/dL).   Medical History: Past Medical History:  Diagnosis Date  . Abnormal mammogram, unspecified 2011  . Arthritis 2002  . GERD (gastroesophageal reflux disease)   . Heart murmur 1985  . Hyperlipidemia   . Hypertension 2005  . Hypothyroidism     Medications:  Medications Prior to Admission  Medication Sig Dispense Refill Last Dose  . amitriptyline (ELAVIL) 25 MG tablet Take 25 mg by mouth at bedtime.   05/06/2019 at 2000  . aspirin 81 MG EC tablet Take 81 mg by mouth daily.    05/06/2019 at Unknown time  . Biotin 10 MG TABS Take 10 mg by mouth daily.    05/06/2019 at Unknown time  . diazepam (VALIUM) 5 MG tablet Take 5 mg by mouth daily as needed for anxiety.    Unknown at PRN  . levothyroxine (SYNTHROID, LEVOTHROID) 100 MCG tablet Take 100 mcg by mouth daily.   05/07/2019 at 0600  . losartan-hydrochlorothiazide (HYZAAR) 100-12.5 MG tablet Take 1 tablet daily by mouth.   05/07/2019 at 0600  . omeprazole (PRILOSEC) 20 MG capsule Take 20 mg by  mouth daily.    05/06/2019 at Unknown time  . pentosan polysulfate (ELMIRON) 100 MG capsule Take 100 mg by mouth 3 (three) times daily.    05/07/2019 at 0600  . simvastatin (ZOCOR) 20 MG tablet Take 20 mg by mouth every evening.   05/06/2019 at 1700    Assessment: Pharmacy has been consulted to transition patient from Heparin Drip to Apixaban Dosing for Atrial Flutter. Heparin was stopped on 9/23@1632 . Will initiate Apixaban first dose@1800 .  Goal of Therapy:  Monitor platelets by anticoagulation protocol: Yes   Plan:  Apixaban 5mg  PO BID. Hgb and platelets have remained steady while on Heparin drip. Will continue to monitor while in-patient.   Jaleeya Mcnelly A Avaneesh Pepitone 05/08/2019,5:18 PM

## 2019-05-08 NOTE — Progress Notes (Signed)
*  PRELIMINARY RESULTS* Echocardiogram Echocardiogram Transesophageal has been performed.  Meagan Wilcox 05/08/2019, 1:49 PM

## 2019-05-08 NOTE — Anesthesia Procedure Notes (Signed)
Date/Time: 05/08/2019 12:50 PM Performed by: Nelda Marseille, CRNA Pre-anesthesia Checklist: Patient identified, Emergency Drugs available, Suction available, Patient being monitored and Timeout performed Oxygen Delivery Method: Nasal cannula

## 2019-05-08 NOTE — Progress Notes (Signed)
Made dr. Margaretmary Eddy aware patient has trouble voiding, bladder scan done and showed 530. Per md do in and out cath. Post cath void was 624ml. Patient tolerated well

## 2019-05-09 ENCOUNTER — Telehealth: Payer: Self-pay | Admitting: Internal Medicine

## 2019-05-09 ENCOUNTER — Encounter: Payer: Self-pay | Admitting: Cardiology

## 2019-05-09 DIAGNOSIS — I483 Typical atrial flutter: Secondary | ICD-10-CM

## 2019-05-09 DIAGNOSIS — H9193 Unspecified hearing loss, bilateral: Secondary | ICD-10-CM

## 2019-05-09 DIAGNOSIS — R531 Weakness: Secondary | ICD-10-CM

## 2019-05-09 LAB — CBC
HCT: 33.7 % — ABNORMAL LOW (ref 36.0–46.0)
Hemoglobin: 11.3 g/dL — ABNORMAL LOW (ref 12.0–15.0)
MCH: 31 pg (ref 26.0–34.0)
MCHC: 33.5 g/dL (ref 30.0–36.0)
MCV: 92.6 fL (ref 80.0–100.0)
Platelets: 200 10*3/uL (ref 150–400)
RBC: 3.64 MIL/uL — ABNORMAL LOW (ref 3.87–5.11)
RDW: 13.9 % (ref 11.5–15.5)
WBC: 7.2 10*3/uL (ref 4.0–10.5)
nRBC: 0 % (ref 0.0–0.2)

## 2019-05-09 LAB — ECHOCARDIOGRAM COMPLETE
Height: 66 in
Weight: 3040 oz

## 2019-05-09 MED ORDER — LOSARTAN POTASSIUM 50 MG PO TABS: 50.0000 mg | ORAL_TABLET | Freq: Every day | ORAL | 0 refills | Status: DC

## 2019-05-09 MED ORDER — METOPROLOL TARTRATE 25 MG PO TABS: 25.0000 mg | ORAL_TABLET | Freq: Two times a day (BID) | ORAL | Status: DC

## 2019-05-09 MED ORDER — METOPROLOL TARTRATE 25 MG PO TABS: 25.0000 mg | ORAL_TABLET | Freq: Two times a day (BID) | ORAL | 0 refills | Status: DC

## 2019-05-09 MED ORDER — LOSARTAN POTASSIUM 50 MG PO TABS: 50.0000 mg | ORAL_TABLET | Freq: Every day | ORAL | Status: DC

## 2019-05-09 MED ORDER — APIXABAN 5 MG PO TABS: 5.0000 mg | ORAL_TABLET | Freq: Two times a day (BID) | ORAL | 0 refills | Status: DC

## 2019-05-09 MED ORDER — SENNOSIDES-DOCUSATE SODIUM 8.6-50 MG PO TABS: 1.0000 | ORAL_TABLET | Freq: Every evening | ORAL | 0 refills | Status: DC | PRN

## 2019-05-09 NOTE — Progress Notes (Signed)
Patient alert and oriented, vss, no complaints of pain.  Niece at bedside.  Repeated back discharge insturctions.  noq uestions.  D/c telemtry and pv.  Escorted out of hospital via wheelchair by volunteers.

## 2019-05-09 NOTE — Telephone Encounter (Signed)
TCM....  Patient is being discharged     They are scheduled to see End 10/5 at 320 pm They were seen for cardioversion   They need to be seen within 7-10 days

## 2019-05-09 NOTE — Progress Notes (Signed)
Progress Note  Patient Name: Meagan Wilcox Date of Encounter: 05/09/2019  Primary Cardiologist: Dr. Saunders Revel  Subjective   TEE cardioversion yesterday, Feels well, after following Blood pressure running low this morning Heart rate stable 70s to 80s normal sinus rhythm Tolerated procedure well, family at the bedside Patient is hard of hearing  Inpatient Medications    Scheduled Meds: . amitriptyline  25 mg Oral QHS  . apixaban  5 mg Oral BID  . influenza vaccine adjuvanted  0.5 mL Intramuscular Tomorrow-1000  . levothyroxine  100 mcg Oral Q0600  . [START ON 05/10/2019] losartan  50 mg Oral Daily  . [START ON 05/10/2019] metoprolol tartrate  25 mg Oral BID  . pantoprazole  40 mg Oral Daily  . pentosan polysulfate  100 mg Oral TID  . simvastatin  20 mg Oral QPM  . sodium chloride flush  3 mL Intravenous Q12H   Continuous Infusions: . sodium chloride Stopped (05/08/19 1900)  . sodium chloride     PRN Meds: sodium chloride, acetaminophen **OR** acetaminophen, albuterol, bisacodyl, diazepam, HYDROcodone-acetaminophen, menthol-cetylpyridinium, ondansetron **OR** ondansetron (ZOFRAN) IV, senna-docusate, sodium chloride flush, sodium chloride flush   Vital Signs    Vitals:   05/08/19 1657 05/08/19 1952 05/09/19 0346 05/09/19 0737  BP: (!) 112/50 106/63 (!) 114/54 (!) 116/31  Pulse: 70 71 70 67  Resp:  14 16 16   Temp:  98.7 F (37.1 C) 97.7 F (36.5 C) 97.7 F (36.5 C)  TempSrc:  Oral Oral Oral  SpO2:  97% 94% 99%  Weight:   77 kg   Height:        Intake/Output Summary (Last 24 hours) at 05/09/2019 1120 Last data filed at 05/09/2019 1017 Gross per 24 hour  Intake 1591.47 ml  Output 1150 ml  Net 441.47 ml   Last 3 Weights 05/09/2019 05/08/2019 05/07/2019  Weight (lbs) 169 lb 12.8 oz 168 lb 8 oz 190 lb  Weight (kg) 77.021 kg 76.431 kg 86.183 kg      Telemetry    Normal sinus rhythm with APCs- Personally Reviewed  ECG     - Personally Reviewed  Physical Exam    GEN: No acute distress.  Hard of hearing Neck: No JVD Cardiac: RRR, no murmurs, rubs, or gallops.  Respiratory: Clear to auscultation bilaterally. GI: Soft, nontender, non-distended  MS: No edema; No deformity. Neuro:  Nonfocal  Psych: Normal affect   Labs    High Sensitivity Troponin:   Recent Labs  Lab 05/07/19 1047 05/07/19 1358  TROPONINIHS 6 7      Chemistry Recent Labs  Lab 05/07/19 1047 05/08/19 0516  NA 139 139  K 4.0 3.7  CL 103 106  CO2 25 25  GLUCOSE 116* 121*  BUN 18 18  CREATININE 0.74 0.66  CALCIUM 10.2 9.2  PROT 7.6  --   ALBUMIN 4.5  --   AST 21  --   ALT 17  --   ALKPHOS 49  --   BILITOT 0.6  --   GFRNONAA >60 >60  GFRAA >60 >60  ANIONGAP 11 8     Hematology Recent Labs  Lab 05/07/19 1047 05/08/19 0516 05/09/19 0442  WBC 7.8 8.8 7.2  RBC 4.44 4.09 3.64*  HGB 13.5 12.4 11.3*  HCT 40.9 37.1 33.7*  MCV 92.1 90.7 92.6  MCH 30.4 30.3 31.0  MCHC 33.0 33.4 33.5  RDW 13.6 13.4 13.9  PLT 255 228 200    BNPNo results for input(s): BNP, PROBNP in the last  168 hours.   DDimer No results for input(s): DDIMER in the last 168 hours.   Radiology    Dg Chest 1 View  Result Date: 05/07/2019 CLINICAL DATA:  Pt in via [facility] from home; was having home health annual visit, sent here for tachycardia. Reports feeling anxious about visit, history hypertension, heart murmur, nonsmoker EXAM: CHEST  1 VIEW COMPARISON:  none FINDINGS: Coarse bronchovascular markings without focal infiltrate or overt edema. Heart size upper limits normal for technique. No pneumothorax. No effusion. Visualized bones unremarkable. IMPRESSION: No acute cardiopulmonary disease. Electronically Signed   By: Corlis Leak M.D.   On: 05/07/2019 14:12    Cardiac Studies   TEE  1. Left ventricular ejection fraction, by visual estimation, is 55 to 60%. The left ventricle has normal function. Normal left ventricular size. There is no left ventricular hypertrophy.  2. Global right  ventricle has normal systolic function.The right ventricular size is normal. Right vetricular wall thickness was not assessed.  3. Left atrial size was not well visualized.  4. Mildly dilated. no evidence of LA thrombus, velocity of 50cm/s in LA appendage.  5. Right atrial size was normal.  6. The mitral valve is normal in structure. Mild mitral valve regurgitation.  7. The tricuspid valve is normal in structure. Tricuspid valve regurgitation is mild.  8. The aortic valve is tricuspid Aortic valve regurgitation was not visualized by color flow Doppler. Structurally normal aortic valve, with no evidence of sclerosis or stenosis.  9. The pulmonic valve was not well visualized. Pulmonic valve regurgitation is not visualized by color flow Doppler.   Patient Profile     Meagan Wilcox is a 85 y.o. female with a hx of hypertension, hyperlipidemia, hypothyroidism, GERD, and arthritis who is being seen  for the evaluation of atrial flutter   Assessment & Plan    Atrial flutter  Status post TEE and cardioversion yesterday, details discussed with her and her niece at bedside Normal ejection fraction on echo Initially started on diltiazem infusion on arrival, transitioned to metoprolol tartrate On eliquis 5 BID -We will change metoprolol tartrate to 25 twice daily -Discontinue aspirin  Continue Eliquis at discharge Close outpatient follow-up with Dr. Okey Dupre  Hypertension: Blood pressure running low,  Will decrease losartan 100/HCTZ 12.5 down to losartan 50 daily  Long discussion with niece at the bedside Medication changes discussed in detail Patient is hard of hearing Discussed each medication change and role of each medication Discharge planning, discussed with hospitalist service, niece at the bedside and with patient  Total encounter time more than 35 minutes  Greater than 50% was spent in counseling and coordination of care with the patient   For questions or updates, please contact [facility] Please consult [email or web address] for contact info under        Signed, Julien Nordmann, MD  05/09/2019, 11:20 AM

## 2019-05-09 NOTE — Discharge Summary (Signed)
[facility] [place] [facility]   PATIENT NAME: Meagan Wilcox    MR#:  [id number]  DATE OF BIRTH:  03-Aug-1931  DATE OF ADMISSION:  05/07/2019 ADMITTING PHYSICIAN: Shaune Pollack, MD  DATE OF DISCHARGE:  05/09/19   PRIMARY CARE PHYSICIAN: Corky Downs, MD    ADMISSION DIAGNOSIS:  Tachycardia [R00.0] Generalized weakness [R53.1]  DISCHARGE DIAGNOSIS:  Active Problems:   Atrial flutter (HCC)   SECONDARY DIAGNOSIS:   Past Medical History:  Diagnosis Date  . Abnormal mammogram, unspecified 2011  . Arthritis 2002  . GERD (gastroesophageal reflux disease)   . Heart murmur 1985  . Hyperlipidemia   . Hypertension 2005  . Hypothyroidism     HOSPITAL COURSE:  Meagan Wilcox  is a 85 y.o. female with a known history of hypertension, hyperlipidemia, hypothyroidism, GERD and arthritis.  The patient presents the ED with above chief complaints.  She denies any headache, dizziness, chest pain, shortness of breath or cough or wheezing.  She is found tachycardia and EKG showed a flutter with heart rate at 120s.  She is treated with Cardizem IV without improvement.  ED physician start Cardizem drip.  A flutter. Continue telemetry monitor Patient was on Cardizem drip and heparin drip Status post TEE cardioversion converted back to sinus rhythm tolerated procedure well -Discontinued heparin drip and started the patient on Eliquis , monitor pt closely while on Eliquis as Elmiron may interact with it  -Cardizem drip discontinued in view of bradycardia -Appreciate cardiology recommendations -pt ambulating with so sob , pulse -84 -ok to dc pt home   Hypertension. BP meds changed to cozaar and metoprolol  Hypothyroidism. Continue Synthyroid.  Sore throat from the procedure-Cepacol lozenges   DISCHARGE CONDITIONS:   Stable  CONSULTS OBTAINED:  Treatment Team:  Yvonne Kendall, MD   PROCEDURES TEE cardioversion 05/08/2019  DRUG ALLERGIES:   Allergies   Allergen Reactions  . Amoxicillin Diarrhea  . Penicillins Rash    DISCHARGE MEDICATIONS:   Allergies as of 05/09/2019      Reactions   Amoxicillin Diarrhea   Penicillins Rash      Medication List    STOP taking these medications   aspirin 81 MG EC tablet   losartan-hydrochlorothiazide 100-12.5 MG tablet Commonly known as: HYZAAR     TAKE these medications   amitriptyline 25 MG tablet Commonly known as: ELAVIL Take 25 mg by mouth at bedtime.   apixaban 5 MG Tabs tablet Commonly known as: ELIQUIS Take 1 tablet (5 mg total) by mouth 2 (two) times daily.   Biotin 10 MG Tabs Take 10 mg by mouth daily.   diazepam 5 MG tablet Commonly known as: VALIUM Take 5 mg by mouth daily as needed for anxiety.   levothyroxine 100 MCG tablet Commonly known as: SYNTHROID Take 100 mcg by mouth daily.   losartan 50 MG tablet Commonly known as: COZAAR Take 1 tablet (50 mg total) by mouth daily. Start taking on: May 10, 2019   metoprolol tartrate 25 MG tablet Commonly known as: LOPRESSOR Take 1 tablet (25 mg total) by mouth 2 (two) times daily. Start taking on: May 10, 2019   omeprazole 20 MG capsule Commonly known as: PRILOSEC Take 20 mg by mouth daily.   pentosan polysulfate 100 MG capsule Commonly known as: ELMIRON Take 100 mg by mouth 3 (three) times daily.   senna-docusate 8.6-50 MG tablet Commonly known as: Senokot-S Take 1 tablet by mouth at bedtime as needed for mild constipation.   simvastatin  20 MG tablet Commonly known as: ZOCOR Take 20 mg by mouth every evening.        DISCHARGE INSTRUCTIONS:   Follow-up with primary care physician in 3 days Follow-up with cardiology Dr End in 1 week    DIET:  Cardiac diet  DISCHARGE CONDITION:  Stable  ACTIVITY:  Activity as tolerated  OXYGEN:  Home Oxygen: No.   Oxygen Delivery: room air  DISCHARGE LOCATION:  home   If you experience worsening of your admission symptoms, develop shortness  of breath, life threatening emergency, suicidal or homicidal thoughts you must seek medical attention immediately by calling 911 or calling your MD immediately  if symptoms less severe.  You Must read complete instructions/literature along with all the possible adverse reactions/side effects for all the Medicines you take and that have been prescribed to you. Take any new Medicines after you have completely understood and accpet all the possible adverse reactions/side effects.   Please note  You were cared for by a hospitalist during your hospital stay. If you have any questions about your discharge medications or the care you received while you were in the hospital after you are discharged, you can call the unit and asked to speak with the hospitalist on call if the hospitalist that took care of you is not available. Once you are discharged, your primary care physician will handle any further medical issues. Please note that NO REFILLS for any discharge medications will be authorized once you are discharged, as it is imperative that you return to your primary care physician (or establish a relationship with a primary care physician if you do not have one) for your aftercare needs so that they can reassess your need for medications and monitor your lab values.     Today  Chief Complaint  Patient presents with  . Tachycardia    Patient is feeling much better denies any palpitations or dizziness.  Ambulated in the hallway without any difficulty.  Pulse was staying at 84 during ambulation  ROS:  CONSTITUTIONAL: Denies fevers, chills. Denies any fatigue, weakness.  EYES: Denies blurry vision, double vision, eye pain. EARS, NOSE, THROAT: Denies tinnitus, ear pain, hearing loss. RESPIRATORY: Denies cough, wheeze, shortness of breath.  CARDIOVASCULAR: Denies chest pain, palpitations, edema.  GASTROINTESTINAL: Denies nausea, vomiting, diarrhea, abdominal pain. Denies bright red blood per  rectum. GENITOURINARY: Denies dysuria, hematuria. ENDOCRINE: Denies nocturia or thyroid problems. HEMATOLOGIC AND LYMPHATIC: Denies easy bruising or bleeding. SKIN: Denies rash or lesion. MUSCULOSKELETAL: Denies pain in neck, back, shoulder, knees, hips or arthritic symptoms.  NEUROLOGIC: Denies paralysis, paresthesias.  PSYCHIATRIC: Denies anxiety or depressive symptoms.   VITAL SIGNS:  Blood pressure (!) 116/31, pulse 67, temperature 97.7 F (36.5 C), temperature source Oral, resp. rate 16, height 5\' 6"  (1.676 m), weight 77 kg, SpO2 99 %.  I/O:    Intake/Output Summary (Last 24 hours) at 05/09/2019 1258 Last data filed at 05/09/2019 1017 Gross per 24 hour  Intake 1591.47 ml  Output 550 ml  Net 1041.47 ml    PHYSICAL EXAMINATION:  GENERAL:  85 y.o.-year-old patient lying in the bed with no acute distress.  EYES: Pupils equal, round, reactive to light and accommodation. No scleral icterus. Extraocular muscles intact.  HEENT: Head atraumatic, normocephalic. Oropharynx and nasopharynx clear.  NECK:  Supple, no jugular venous distention. No thyroid enlargement, no tenderness.  LUNGS: Normal breath sounds bilaterally, no wheezing, rales,rhonchi or crepitation. No use of accessory muscles of respiration.  CARDIOVASCULAR: S1, S2 normal. No murmurs,  rubs, or gallops.  ABDOMEN: Soft, non-tender, non-distended. Bowel sounds present.  EXTREMITIES: No pedal edema, cyanosis, or clubbing.  NEUROLOGIC: Cranial nerves II through XII are intact. Muscle strength at her baseline in all extremities. Sensation intact. Gait not checked.  PSYCHIATRIC: The patient is alert and oriented x 3.  SKIN: No obvious rash, lesion, or ulcer.   DATA REVIEW:   CBC Recent Labs  Lab 05/09/19 0442  WBC 7.2  HGB 11.3*  HCT 33.7*  PLT 200    Chemistries  Recent Labs  Lab 05/07/19 1047 05/08/19 0516  NA 139 139  K 4.0 3.7  CL 103 106  CO2 25 25  GLUCOSE 116* 121*  BUN 18 18  CREATININE 0.74 0.66   CALCIUM 10.2 9.2  MG 1.9  --   AST 21  --   ALT 17  --   ALKPHOS 49  --   BILITOT 0.6  --     Cardiac Enzymes No results for input(s): TROPONINI in the last 168 hours.  Microbiology Results  Results for orders placed or performed during the hospital encounter of 05/07/19  Urine culture     Status: None   Collection Time: 05/07/19 10:59 AM   Specimen: Urine, Random  Result Value Ref Range Status   Specimen Description   Final    URINE, RANDOM Performed at [facility], [address] [place], [place] [address]    Special Requests   Final    NONE Performed at [facility], [address] [place], [place] [address]    Culture   Final    NO GROWTH Performed at [facility], [address] [place], [place] [address]    Report Status 05/08/2019 FINAL  Final  SARS CORONAVIRUS 2 (TAT 6-24 HRS) Nasopharyngeal Nasopharyngeal Swab     Status: None   Collection Time: 05/07/19  1:58 PM   Specimen: Nasopharyngeal Swab  Result Value Ref Range Status   SARS Coronavirus 2 NEGATIVE NEGATIVE Final    Comment: (NOTE) SARS-CoV-2 target nucleic acids are NOT DETECTED. The SARS-CoV-2 RNA is generally detectable in upper and lower respiratory specimens during the acute phase of infection. Negative results do not preclude SARS-CoV-2 infection, do not rule out co-infections with other pathogens, and should not be used as the sole basis for treatment or other patient management decisions. Negative results must be combined with clinical observations, patient history, and epidemiological information. The expected result is Negative. Fact Sheet for Patients: HairSlick.nohttps://www.fda.gov/media/138098/download Fact Sheet for Healthcare Providers: quierodirigir.comhttps://www.fda.gov/media/138095/download This test is not yet approved or cleared by the [place] FDA and  has been authorized for detection and/or diagnosis of SARS-CoV-2 by FDA under an Emergency Use Authorization (EUA). This  EUA will remain  in effect (meaning this test can be used) for the duration of the COVID-19 declaration under Section 56 4(b)(1) of the Act, 21 U.S.C. section 360bbb-3(b)(1), unless the authorization is terminated or revoked sooner. Performed at [facility], [address] [place], [place] [address]     RADIOLOGY:  Dg Chest 1 View  Result Date: 05/07/2019 CLINICAL DATA:  [facility]; was having home health annual visit, sent here for tachycardia. Reports feeling anxious about visit, history hypertension, heart murmur, nonsmoker EXAM: CHEST  1 VIEW COMPARISON:  none FINDINGS: Coarse bronchovascular markings without focal infiltrate or overt edema. Heart size upper limits normal for technique. No pneumothorax. No effusion. Visualized bones unremarkable. IMPRESSION: No acute cardiopulmonary disease. Electronically Signed  By: Corlis Leak M.D.   On: 05/07/2019 14:12    EKG:   Orders placed or performed during the hospital encounter of 05/07/19  . EKG 12-Lead  . EKG 12-Lead  . EKG 12-Lead  . EKG 12-Lead  . EKG 12-Lead  . EKG 12-Lead  . EKG 12-Lead  . EKG 12-Lead  . EKG 12-Lead  . EKG 12-Lead  . EKG 12-Lead  . EKG 12-Lead  . EKG 12-Lead  . EKG 12-Lead      Management plans discussed with the patient, family and they are in agreement.  CODE STATUS:     Code Status Orders  (From admission, onward)         Start     Ordered   05/07/19 1741  Full code  Continuous     05/07/19 1740        Code Status History    This patient has a current code status but no historical code status.   Advance Care Planning Activity    Advance Directive Documentation     Most Recent Value  Type of Advance Directive  Healthcare Power of Attorney, Living will  Pre-existing out of facility DNR order (yellow form or pink MOST form)  -  "MOST" Form in Place?  -      TOTAL TIME TAKING CARE OF THIS PATIENT: 43  minutes.   Note: This dictation was prepared with  Dragon dictation along with smaller phrase technology. Any transcriptional errors that result from this process are unintentional.   @M[facility] @  on 05/09/2019 at 12:58 PM  Between 7am to 6pm - Pager - [phone]  After 6pm go to [email or web address] - password [email or web address] [facility]  [facility] [employer] [facility]  Office  [phone]  CC: Primary care physician; [phone], MD

## 2019-05-09 NOTE — Discharge Instructions (Signed)
Follow-up with primary care physician in 3 days Follow-up with cardiology Dr End in 1 week

## 2019-05-09 NOTE — TOC Initial Note (Signed)
Transition of Care [facility]) - Initial/Assessment Note    Patient Details  Name: Meagan Wilcox MRN: [id number] Date of Birth: [birth year]  [facility]) CM/SW Contact:    Meagan Ludwig, LCSW Phone Number: 05/09/2019, 11:01 AM  Clinical Narrative:                  Patient is an 85 year old female who lives alone, at independent living.  Patient stated that she has not had home health before.  Patient's niece Meagan Wilcox was at bedside, and helps take care of patient's needs.  Patient is interested in home health services, CSW provided choice, and she said whichever agency can see her the soonest.  CSW contacted [facility], and they are able to accept patient.  CSW asked physician if she can order home health PT, RN, SW, and aide.  CSW updated patient and family.  Patient states she does not feel like she needs any other equipment, patient has a rolling walker that she will start using.  Patient currently has a 4 legged cane.  Patient and niece were appreciative of assitance.  Expected Discharge Plan: [place] Barriers to Discharge: Continued Medical Work up   Patient Goals and CMS Choice Patient states their goals for this hospitalization and ongoing recovery are:: To return back home with home health. CMS Medicare.gov Compare Post Acute Care list provided to:: Patient Choice offered to / list presented to : Patient  Expected Discharge Plan and Services Expected Discharge Plan: [place] Acute Care Choice: [place] arrangements for the past 2 months: Single Family Home                                      Prior Living Arrangements/Services Living arrangements for the past 2 months: Single Family Home Lives with:: Self Patient language and need for interpreter reviewed:: Yes Do you feel safe going back to the place where you live?: Yes      Need for Family Participation in Patient Care: No (Comment) Care giver  support system in place?: No (comment)   Criminal Activity/Legal Involvement Pertinent to Current Situation/Hospitalization: No - Comment as needed  Activities of Daily Living Home Assistive Devices/Equipment: Cane (specify quad or straight) ADL Screening (condition at time of admission) Patient's cognitive ability adequate to safely complete daily activities?: Yes Is the patient deaf or have difficulty hearing?: Yes Does the patient have difficulty seeing, even when wearing glasses/contacts?: No Does the patient have difficulty concentrating, remembering, or making decisions?: No Patient able to express need for assistance with ADLs?: Yes Does the patient have difficulty dressing or bathing?: No Independently performs ADLs?: Yes (appropriate for developmental age) Does the patient have difficulty walking or climbing stairs?: Yes Weakness of Legs: Both Weakness of Arms/Hands: None  Permission Sought/Granted Permission sought to share information with : Family Supports Permission granted to share information with : Yes, Verbal Permission Granted              Emotional Assessment Appearance:: Appears stated age     Orientation: : Oriented to Self, Oriented to Place, Oriented to  Time, Oriented to Situation   Psych Involvement: No (comment)  Admission diagnosis:  Tachycardia [R00.0] Generalized weakness [R53.1] Patient Active Problem List   Diagnosis Date Noted  . Atrial flutter ([place]) 05/07/2019  . Lumbar radiculopathy 08/01/2017  .  Neuroforaminal stenosis of lumbar spine 08/01/2017  . Lumbar degenerative disc disease 08/01/2017  . Chronic pain syndrome 08/01/2017  . Atypical ductal hyperplasia of left breast 08/31/2016  . Hypertension    PCP:  Meagan Downs, MD Pharmacy:   [facility] [address] - [place], [place] - [address] [place] [address] Phone: [phone] Fax: [phone]  [id number] [place], [place] - [address] AT [facility] [address] [facility] [address] [place] [address] Phone: [phone] Fax: [phone]     Social Determinants of Health (SDOH) Interventions    Readmission Risk Interventions No flowsheet data found.

## 2019-05-09 NOTE — Consult Note (Signed)
ANTICOAGULATION CONSULT NOTE - Initial Consult  Pharmacy Consult for Apixaban dosing Indication: Atrial Flutter  Allergies  Allergen Reactions  . Amoxicillin Diarrhea  . Penicillins Rash    Patient Measurements: Height: 5\' 6"  (167.6 cm) Weight: 169 lb 12.8 oz (77 kg) IBW/kg (Calculated) : 59.3 Heparin Dosing Weight: 77.7 kg  Vital Signs: Temp: 97.7 F (36.5 C) (09/24 0737) Temp Source: Oral (09/24 0737) BP: 116/31 (09/24 0737) Pulse Rate: 67 (09/24 0737)  Labs: Recent Labs    05/07/19 1047 05/07/19 1358 05/08/19 0011 05/08/19 0516 05/08/19 0951 05/09/19 0442  HGB 13.5  --   --  12.4  --  11.3*  HCT 40.9  --   --  37.1  --  33.7*  PLT 255  --   --  228  --  200  APTT 29  --   --   --   --   --   LABPROT 13.1  --   --   --   --   --   INR 1.0  --   --   --   --   --   HEPARINUNFRC  --   --  0.81*  --  0.79*  --   CREATININE 0.74  --   --  0.66  --   --   TROPONINIHS 6 7  --   --   --   --     Estimated Creatinine Clearance: 52.9 mL/min (by C-G formula based on SCr of 0.66 mg/dL).   Medical History: Past Medical History:  Diagnosis Date  . Abnormal mammogram, unspecified 2011  . Arthritis 2002  . GERD (gastroesophageal reflux disease)   . Heart murmur 1985  . Hyperlipidemia   . Hypertension 2005  . Hypothyroidism     Medications:  Medications Prior to Admission  Medication Sig Dispense Refill Last Dose  . amitriptyline (ELAVIL) 25 MG tablet Take 25 mg by mouth at bedtime.   05/06/2019 at 2000  . aspirin 81 MG EC tablet Take 81 mg by mouth daily.    05/06/2019 at Unknown time  . Biotin 10 MG TABS Take 10 mg by mouth daily.    05/06/2019 at Unknown time  . diazepam (VALIUM) 5 MG tablet Take 5 mg by mouth daily as needed for anxiety.    Unknown at PRN  . levothyroxine (SYNTHROID, LEVOTHROID) 100 MCG tablet Take 100 mcg by mouth daily.   05/07/2019 at 0600  . losartan-hydrochlorothiazide (HYZAAR) 100-12.5 MG tablet Take 1 tablet daily by mouth.   05/07/2019 at  0600  . omeprazole (PRILOSEC) 20 MG capsule Take 20 mg by mouth daily.    05/06/2019 at Unknown time  . pentosan polysulfate (ELMIRON) 100 MG capsule Take 100 mg by mouth 3 (three) times daily.    05/07/2019 at 0600  . simvastatin (ZOCOR) 20 MG tablet Take 20 mg by mouth every evening.   05/06/2019 at 4212   85 year-old-female admitted for atrial flutter.  She underwent cardioversion yesterday 9/23 while on heparin drip.    Assessment: Pharmacy was consulted to transition patient from Heparin Drip to Apixaban Dosing for Atrial Flutter. Heparin was stopped on 9/23 at 1632. Apixaban started 9/23 at 1800.    Goal of Therapy:  Monitor platelets by anticoagulation protocol: Yes   Plan:  Apixaban 5mg  PO BID. Hgb and platelets continue to remain steady wnl.  Pharmacy will continue to monitor while she is inpatient.   Gerald Dexter 05/09/2019,12:43 PM

## 2019-05-09 NOTE — Anesthesia Postprocedure Evaluation (Signed)
Anesthesia Post Note  Patient: Meagan Wilcox  Procedure(s) Performed: TRANSESOPHAGEAL ECHOCARDIOGRAM (TEE) (N/A ) CARDIOVERSION (N/A )  Patient location during evaluation: PACU Anesthesia Type: General Level of consciousness: awake and alert Pain management: pain level controlled Vital Signs Assessment: post-procedure vital signs reviewed and stable Respiratory status: spontaneous breathing, nonlabored ventilation, respiratory function stable and patient connected to nasal cannula oxygen Cardiovascular status: blood pressure returned to baseline and stable Postop Assessment: no apparent nausea or vomiting Anesthetic complications: no     Last Vitals:  Vitals:   05/09/19 0346 05/09/19 0737  BP: (!) 114/54 (!) 116/31  Pulse: 70 67  Resp: 16 16  Temp: 36.5 C 36.5 C  SpO2: 94% 99%    Last Pain:  Vitals:   05/09/19 0737  TempSrc: Oral  PainSc:                  Durenda Hurt

## 2019-05-09 NOTE — TOC Transition Note (Signed)
Transition of Care [facility]) - CM/SW Discharge Note   Patient Details  Name: Meagan Wilcox MRN: [id number] Date of Birth: Oct 24, 1930  [facility] CM/SW Contact:  Ross Ludwig, LCSW Phone Number: 05/09/2019, 12:08 PM   Clinical Narrative:     Once patient is medically ready for discharge she will discharge back home with home health through [facility].  CSW notified [facility].   Final next level of care: [place] Barriers to Discharge: Barriers Resolved   Patient Goals and CMS Choice Patient states their goals for this hospitalization and ongoing recovery are:: To return back home with home health. CMS Medicare.gov Compare Post Acute Care list provided to:: Patient Choice offered to / list presented to : Patient  Discharge Placement                       Discharge Plan and Services     Post Acute Care Choice: Home Health          DME Arranged: N/A         HH Arranged: RN, PT, Nurse's Aide, Social Work [employer] Agency: [facility] [place] Date [place] Agency Contacted: 05/09/19 Time [place]: [id number] Representative spoke with at [place]: [place] (SDOH) Interventions     Readmission Risk Interventions No flowsheet data found.

## 2019-05-10 NOTE — Telephone Encounter (Signed)
Patient contacted regarding discharge from [facility] on 05/09/19.  Patient understands to follow up with provider Dr ENd on 05/20/19 at 3:20 pm at [facility]. Patient understands discharge instructions? yes Patient understands medications and regiment? yes Patient understands to bring all medications to this visit? yes

## 2019-05-20 ENCOUNTER — Ambulatory Visit: Payer: Medicare Other | Admitting: Internal Medicine

## 2019-05-28 ENCOUNTER — Emergency Department
Admission: EM | Admit: 2019-05-28 | Discharge: 2019-05-28 | Disposition: A | Discharge status: Home / Self Care | Payer: Medicare Other | Attending: Emergency Medicine | Admitting: Emergency Medicine

## 2019-05-28 ENCOUNTER — Other Ambulatory Visit: Payer: Self-pay

## 2019-05-28 DIAGNOSIS — I10 Essential (primary) hypertension: Secondary | ICD-10-CM | POA: Diagnosis present

## 2019-05-28 DIAGNOSIS — Z79899 Other long term (current) drug therapy: Secondary | ICD-10-CM | POA: Diagnosis not present

## 2019-05-28 DIAGNOSIS — I483 Typical atrial flutter: Secondary | ICD-10-CM | POA: Insufficient documentation

## 2019-05-28 DIAGNOSIS — E039 Hypothyroidism, unspecified: Secondary | ICD-10-CM | POA: Insufficient documentation

## 2019-05-28 DIAGNOSIS — Z7901 Long term (current) use of anticoagulants: Secondary | ICD-10-CM | POA: Insufficient documentation

## 2019-05-28 LAB — CBC
HCT: 41.1 % (ref 36.0–46.0)
Hemoglobin: 13.8 g/dL (ref 12.0–15.0)
MCH: 30.7 pg (ref 26.0–34.0)
MCHC: 33.6 g/dL (ref 30.0–36.0)
MCV: 91.5 fL (ref 80.0–100.0)
Platelets: 236 10*3/uL (ref 150–400)
RBC: 4.49 MIL/uL (ref 3.87–5.11)
RDW: 13.5 % (ref 11.5–15.5)
WBC: 8.4 10*3/uL (ref 4.0–10.5)
nRBC: 0 % (ref 0.0–0.2)

## 2019-05-28 LAB — COMPREHENSIVE METABOLIC PANEL
ALT: 12 U/L (ref 0–44)
AST: 17 U/L (ref 15–41)
Albumin: 4.3 g/dL (ref 3.5–5.0)
Alkaline Phosphatase: 46 U/L (ref 38–126)
Anion gap: 10 (ref 5–15)
BUN: 17 mg/dL (ref 8–23)
CO2: 27 mmol/L (ref 22–32)
Calcium: 10.1 mg/dL (ref 8.9–10.3)
Chloride: 103 mmol/L (ref 98–111)
Creatinine, Ser: 0.78 mg/dL (ref 0.44–1.00)
GFR calc Af Amer: >60 mL/min (ref >60)
GFR calc non Af Amer: >60 mL/min (ref >60)
Glucose, Bld: 112 mg/dL — ABNORMAL HIGH (ref 70–99)
Potassium: 4.1 mmol/L (ref 3.5–5.1)
Sodium: 140 mmol/L (ref 135–145)
Total Bilirubin: 0.6 mg/dL (ref 0.3–1.2)
Total Protein: 7.2 g/dL (ref 6.5–8.1)

## 2019-05-28 LAB — TROPONIN I (HIGH SENSITIVITY): Troponin I (High Sensitivity): 5 ng/L (ref <18)

## 2019-05-28 LAB — TSH: TSH: 2.687 u[IU]/mL (ref 0.350–4.500)

## 2019-05-28 MED ORDER — METOPROLOL TARTRATE 5 MG/5ML IV SOLN
5.0000 mg | Freq: Once | INTRAVENOUS | Status: AC
  Administered 2019-05-28: 14:00:00 5 mg via INTRAVENOUS
  Filled 2019-05-28: qty 5

## 2019-05-28 NOTE — ED Provider Notes (Signed)
[facility] Emergency Department Provider Note  Time seen: 1:10 PM  I have reviewed the triage vital signs and the nursing notes.   HISTORY  Chief Complaint Hypertension   HPI Meagan Wilcox is a 85 y.o. female with a past medical history of gastric reflux, hypertension, hyperlipidemia, presents to the emergency department for a rapid heart rate and hypertension.  According to the patient  and record review patient was admitted to the hospital service last month for new onset atrial flutter.  Patient was placed on Eliquis, placed on metoprolol 25 mg twice daily.  Patient states since her discharge home she has had a nurse come out to her house twice a week to check vitals and check on her.  She states this morning the nurse was concerned because her heart rate was in the 120s and her blood pressure was elevated as high as 585 systolic so they called EMS to bring the patient to the emergency department.  Patient states over the past several days her heart rate is remained in the 120s.  Has been taking all her medications as prescribed.  Saw her primary care doctor yesterday her heart rate was 122 and she was hypertensive, he increased her blood pressure medication but not her metoprolol.  Past Medical History:  Diagnosis Date  . Abnormal mammogram, unspecified 2011  . Arthritis 2002  . GERD (gastroesophageal reflux disease)   . Heart murmur 1985  . Hyperlipidemia   . Hypertension 2005  . Hypothyroidism     Patient Active Problem List   Diagnosis Date Noted  . Atrial flutter ([place]) 05/07/2019  . Lumbar radiculopathy 08/01/2017  . Neuroforaminal stenosis of lumbar spine 08/01/2017  . Lumbar degenerative disc disease 08/01/2017  . Chronic pain syndrome 08/01/2017  . Atypical ductal hyperplasia of left breast 08/31/2016  . Hypertension     Past Surgical History:  Procedure Laterality Date  . APPENDECTOMY  1939  . BREAST BIOPSY Left 02-24-14   benign  . BREAST  BIOPSY Left 02/11/2016   neg  . BREAST BIOPSY Left 2013   neg  . BREAST EXCISIONAL BIOPSY Left 2014   neg  . BREAST SURGERY Left 2013   stereotactic biopsy   . BREAST SURGERY Left May 2010   1 mm foci of atypical ductal hyperplasia  . CARDIOVERSION N/A 05/08/2019   Procedure: CARDIOVERSION;  Surgeon: Kate Sable, MD;  Location: [facility] ORS;  Service: Cardiovascular;  Laterality: N/A;  . cataract surgery  Bilateral 2009  . COLONOSCOPY  2010   Dr. Vira Agar  . left breast biopsy   2009  . TEE WITHOUT CARDIOVERSION N/A 05/08/2019   Procedure: TRANSESOPHAGEAL ECHOCARDIOGRAM (TEE);  Surgeon: Kate Sable, MD;  Location: [facility] ORS;  Service: Cardiovascular;  Laterality: N/A;    Prior to Admission medications   Medication Sig Start Date End Date Taking? Authorizing Provider  amitriptyline (ELAVIL) 25 MG tablet Take 25 mg by mouth at bedtime.    [provider]  apixaban (ELIQUIS) 5 MG TABS tablet Take 1 tablet (5 mg total) by mouth 2 (two) times daily. 05/09/19   Nicholes Mango, MD  Biotin 10 MG TABS Take 10 mg by mouth daily.     [provider]  diazepam (VALIUM) 5 MG tablet Take 5 mg by mouth daily as needed for anxiety.     [provider]  levothyroxine (SYNTHROID, LEVOTHROID) 100 MCG tablet Take 100 mcg by mouth daily.    [provider]  losartan (COZAAR) 50 MG  tablet Take 1 tablet (50 mg total) by mouth daily. 05/10/19   Ramonita Lab, MD  metoprolol tartrate (LOPRESSOR) 25 MG tablet Take 1 tablet (25 mg total) by mouth 2 (two) times daily. 05/10/19   Gouru, Deanna Artis, MD  omeprazole (PRILOSEC) 20 MG capsule Take 20 mg by mouth daily.     [provider]  pentosan polysulfate (ELMIRON) 100 MG capsule Take 100 mg by mouth 3 (three) times daily.     [provider]  senna-docusate (SENOKOT-S) 8.6-50 MG tablet Take 1 tablet by mouth at bedtime as needed for mild constipation. 05/09/19   Gouru, Deanna Artis, MD  simvastatin (ZOCOR) 20 MG tablet  Take 20 mg by mouth every evening.    [provider]    Allergies  Allergen Reactions  . Amoxicillin Diarrhea  . Penicillins Rash    Family History  Problem Relation Age of Onset  . Heart disease Mother   . Heart disease Father   . Diabetes Father   . Stroke Father   . Arthritis Father     Social History Social History   Tobacco Use  . Smoking status: Never Smoker  . Smokeless tobacco: Never Used  Substance Use Topics  . Alcohol use: No    Alcohol/week: 0.0 standard drinks  . Drug use: No    Review of Systems Constitutional: Negative for fever. Cardiovascular: Negative for chest pain.  Positive for palpitations. Respiratory: Negative for shortness of breath. Gastrointestinal: Negative for abdominal pain, Musculoskeletal: Negative for musculoskeletal complaints Skin: Negative for skin complaints  Neurological: Negative for headache All other ROS negative  ____________________________________________   PHYSICAL EXAM:  VITAL SIGNS: ED Triage Vitals  Enc Vitals Group     BP 05/28/19 1230 117/85     Pulse Rate 05/28/19 1230 (!) 126     Resp 05/28/19 1230 16     Temp 05/28/19 1230 98.5 F (36.9 C)     Temp Source 05/28/19 1230 Oral     SpO2 05/28/19 1230 95 %     Weight 05/28/19 1231 166 lb (75.3 kg)     Height 05/28/19 1231 5\' 4"  (1.626 m)     Head Circumference --      Peak Flow --      Pain Score 05/28/19 1231 0     Pain Loc --      Pain Edu? --      Excl. in GC? --    Constitutional: Alert and oriented. Well appearing and in no distress. Eyes: Normal exam ENT      Head: Normocephalic and atraumatic.      Mouth/Throat: Mucous membranes are moist. Cardiovascular: Regular rhythm rate around 120.   Respiratory: Normal respiratory effort without tachypnea nor retractions. Breath sounds are clear Gastrointestinal: Soft and nontender. No distention. Musculoskeletal: Nontender with normal range of motion in all extremities. No lower extremity  tenderness or edema. Neurologic:  Normal speech and language. No gross focal neurologic deficits  Skin:  Skin is warm, dry and intact.  Psychiatric: Mood and affect are normal.   ____________________________________________    EKG  EKG viewed and interpreted by myself appears to show atrial flutter at 125 bpm with a narrow QRS, normal axis, normal intervals, nonspecific ST changes  ____________________________________________   INITIAL IMPRESSION / ASSESSMENT AND PLAN / ED COURSE  Pertinent labs & imaging results that were available during my care of the patient were reviewed by me and considered in my medical decision making (see chart for details).   Patient  presents emergency department for hypertension and tachycardia.  Patient is on Eliquis and metoprolol for newly diagnosed a flutter.  Patient appears to be in a flutter today with a rate around 125 bpm.  Patient is mildly hypertensive currently 143/98 during my evaluation.  Otherwise the patient appears well denies any chest pain or shortness of breath cough or fever.  No leg pain or swelling.  We will dose 5 mg of IV metoprolol and reassess vitals.  We will check labs including TSH.  Labs overall largely within normal limits and reassuring including a negative troponin and normal TSH.  Patient remains in a flutter at 122 bpm.  I discussed the patient with Dr. and of cardiology.  We will increase the patient's metoprolol to 37.5 mg a.m. and p.m.  Patient will follow-up with Dr. and tomorrow in the office.  He states if the patient remains in a flutter they may have to schedule her for another cardioversion.  Patient agreeable to plan of care.  Blood pressure has come down with medication.  Meagan Wilcox was evaluated in Emergency Department on 05/28/2019 for the symptoms described in the history of present illness. She was evaluated in the context of the global COVID-19 pandemic, which necessitated consideration that the patient might be  at risk for infection with the SARS-CoV-2 virus that causes COVID-19. Institutional protocols and algorithms that pertain to the evaluation of patients at risk for COVID-19 are in a state of rapid change based on information released by regulatory bodies including the CDC and federal and state organizations. These policies and algorithms were followed during the patient's care in the ED.  ____________________________________________   FINAL CLINICAL IMPRESSION(S) / ED DIAGNOSES  Hypertension Atrial flutter with rapid ventricular response    Minna AntisPaduchowski, Tamar Lipscomb, MD 05/28/19 1447

## 2019-05-28 NOTE — ED Triage Notes (Signed)
Pt arrives EMS from home with cc of hypertension. Pt reports Systolic normally in the 388E and EMS last taken BP 178/99. HR 125 with a flutter. Pt reports hx of a flutter. Pt states seeing doctor yesterday and increased dosage of losartin.

## 2019-05-28 NOTE — Discharge Instructions (Addendum)
As we discussed please begin taking 1.5 of your metoprolol tablets (37.5 mg) beginning this evening, and continuing twice daily.  Please follow-up with cardiology tomorrow.  Return to the emergency department for any chest pain, trouble breathing, or any other symptom personally concerning to yourself.

## 2019-05-29 ENCOUNTER — Encounter: Payer: Self-pay | Admitting: Internal Medicine

## 2019-05-29 ENCOUNTER — Ambulatory Visit (INDEPENDENT_AMBULATORY_CARE_PROVIDER_SITE_OTHER): Payer: Medicare Other | Admitting: Internal Medicine

## 2019-05-29 VITALS — BP 130/70 | HR 134 | Temp 97.5°F | Ht 63.0 in | Wt 168.2 lb

## 2019-05-29 DIAGNOSIS — I10 Essential (primary) hypertension: Secondary | ICD-10-CM

## 2019-05-29 DIAGNOSIS — I484 Atypical atrial flutter: Secondary | ICD-10-CM | POA: Diagnosis not present

## 2019-05-29 MED ORDER — AMIODARONE HCL 200 MG PO TABS: ORAL_TABLET | ORAL | 2 refills | Status: DC

## 2019-05-29 NOTE — Patient Instructions (Signed)
Medication Instructions:  Your physician has recommended you make the following change in your medication:  1- START Amiodarone 400 mg (2 tablets) by mouth three times a day for 1 week, then 200 mg (1 tablet) by mouth once a day thereafter.  If you need a refill on your cardiac medications before your next appointment, please call your pharmacy.   Lab work: 1) COVID PRE- TEST: You will need a COVID TEST prior to the procedure:  LOCATION: [facility] Testing site.  DATE/TIME:  05/30/19 anytime between 0900 am to 1 pm.   If you have labs (blood work) drawn today and your tests are completely normal, you will receive your results only by:  MyChart Message (if you have MyChart) OR  A paper copy in the mail If you have any lab test that is abnormal or we need to change your treatment, we will call you to review the results.  Testing/Procedures: You are scheduled for a Cardioversion on ___10/16/2020____ with Dr.__Gollan_____ Please arrive at the [facility] at __06:30__ a.m. on the day of your procedure.  DIET INSTRUCTIONS:  Nothing to eat or drink after midnight except your medications with a small sip of water.        1) Labs: ___up to date______  2) Medications:  YOU MAY TAKE ALL of your remaining medications with a small amount of water.  3) Must have a responsible person to drive you home.  4) Bring a current list of your medications and current insurance cards.    If you have any questions after you get home, please call the office at [phone]   Follow-Up: At [facility], you and your health needs are our priority.  As part of our continuing mission to provide you with exceptional heart care, we have created designated Provider Care Teams.  These Care Teams include your primary Cardiologist (physician) and Advanced Practice Providers (APPs -  Physician Assistants and Nurse Practitioners) who all work together to provide you with the care you  need, when you need it. You will need a follow up appointment in 2 weeks.   You may see DR Cristal Deer END  or one of the following Advanced Practice Providers on your designated Care Team:   Nicolasa Ducking, NP Eula Listen, PA-C  Marisue Ivan, PA-C   Electrical Cardioversion  Electrical cardioversion is the delivery of a jolt of electricity to restore a normal rhythm to the heart. A rhythm that is too fast or is not regular keeps the heart from pumping well. In this procedure, sticky patches or metal paddles are placed on the chest to deliver electricity to the heart from a device. This procedure may be done in an emergency if:  There is low or no blood pressure as a result of the heart rhythm.  Normal rhythm must be restored as fast as possible to protect the brain and heart from further damage.  It may save a life. This procedure may also be done for irregular or fast heart rhythms that are not immediately life-threatening. Tell a health care provider about:  Any allergies you have.  All medicines you are taking, including vitamins, herbs, eye drops, creams, and over-the-counter medicines.  Any problems you or family members have had with anesthetic medicines.  Any blood disorders you have.  Any surgeries you have had.  Any medical conditions you have.  Whether you are pregnant or may be pregnant. What are the risks? Generally, this is a  safe procedure. However, problems may occur, including:  Allergic reactions to medicines.  A blood clot that breaks free and travels to other parts of your body.  The possible return of an abnormal heart rhythm within hours or days after the procedure.  Your heart stopping (cardiac arrest). This is rare. What happens before the procedure? Medicines  Your health care provider may have you start taking: ? Blood-thinning medicines (anticoagulants) so your blood does not clot as easily. ? Medicines may be given to help stabilize  your heart rate and rhythm.  Ask your health care provider about changing or stopping your regular medicines. This is especially important if you are taking diabetes medicines or blood thinners. General instructions  Plan to have someone take you home from the hospital or clinic.  If you will be going home right after the procedure, plan to have someone with you for 24 hours.  Follow instructions from your health care provider about eating or drinking restrictions. What happens during the procedure?  To lower your risk of infection: ? Your health care team will wash or sanitize their hands. ? Your skin will be washed with soap.  An IV tube will be inserted into one of your veins.  You will be given a medicine to help you relax (sedative).  Sticky patches (electrodes) or metal paddles may be placed on your chest.  An electrical shock will be delivered. The procedure may vary among health care providers and hospitals. What happens after the procedure?   Your blood pressure, heart rate, breathing rate, and blood oxygen level will be monitored until the medicines you were given have worn off.  Do not drive for 24 hours if you were given a sedative.  Your heart rhythm will be watched to make sure it does not change. This information is not intended to replace advice given to you by your health care provider. Make sure you discuss any questions you have with your health care provider. Document Released: 07/22/2002 Document Revised: 07/14/2017 Document Reviewed: 02/05/2016 Elsevier Patient Education  2020 [employer].

## 2019-05-29 NOTE — Progress Notes (Signed)
Follow-up Outpatient Visit Date: 05/29/2019  Primary Care Provider: Corky Downs, MD [address] [place] [address]  Chief Complaint: Fatigue and elevated heart rate  HPI:  Meagan Wilcox is a 85 y.o. year-old female with history of recently diagnosed atrial fibrillation/flutter s/p TEE-guided cardioversion on 05/08/2019, hypertension, hyperlipidemia, hypothyroidism, GERD, and arthritis who presents for urgent evaluation of tachycardia.  She felt relatively well with improved energy following DCCV last month.  Thee days ago, she noted that her heart rate was elevated (~130 bpm) on her daily BP check.  She has felt progressively more fatigued over the last few days as well.  She was was noted to be tachycardic and hypertensive by home health, prompting ED visit yesterday.  She was found to be in atrial flutter with 2:1 AV block.  Heart rate did not improve with IV metoprolol.  She was sent home on a higher dose of metoprolol with plans to see [place] today.  Currently, Meagan Wilcox reports that she continues to have fatigue and shortness of breath with minimal activity.  She denies chest pain, palpitations, and lightheadedness.  She was seen by her PCP last week and was switched from losartan to losartan-HCTZ last week due to elevated blood pressure and leg swelling.  Leg edema has improved, though not completely resolved.  Her weight, which was up ~5 pounds, is back to baseline.  --------------------------------------------------------------------------------------------------  Past Medical History:  Diagnosis Date  . Abnormal mammogram, unspecified 2011  . Arthritis 2002  . GERD (gastroesophageal reflux disease)   . Heart murmur 1985  . Hyperlipidemia   . Hypertension 2005  . Hypothyroidism    Past Surgical History:  Procedure Laterality Date  . APPENDECTOMY  1939  . BREAST BIOPSY Left 02-24-14   benign  . BREAST BIOPSY Left 02/11/2016   neg  . BREAST BIOPSY Left 2013   neg  . BREAST  EXCISIONAL BIOPSY Left 2014   neg  . BREAST SURGERY Left 2013   stereotactic biopsy   . BREAST SURGERY Left May 2010   1 mm foci of atypical ductal hyperplasia  . CARDIOVERSION N/A 05/08/2019   Procedure: CARDIOVERSION;  Surgeon: Debbe Odea, MD;  Location: [facility];  Service: Cardiovascular;  Laterality: N/A;  . cataract surgery  Bilateral 2009  . COLONOSCOPY  2010   Dr. Mechele Collin  . left breast biopsy   2009  . TEE WITHOUT CARDIOVERSION N/A 05/08/2019   Procedure: TRANSESOPHAGEAL ECHOCARDIOGRAM (TEE);  Surgeon: Debbe Odea, MD;  Location: [facility] OR[facility];  Service: Cardiovascular;  Laterality: N/A;    Current Meds  Medication Sig  . amitriptyline (ELAVIL) 25 MG tablet Take 25 mg by mouth at bedtime.  Marland Kitchen apixaban (ELIQUIS) 5 MG TABS tablet Take 1 tablet (5 mg total) by mouth 2 (two) times daily.  . Biotin 10 MG TABS Take 10 mg by mouth daily.   . diazepam (VALIUM) 5 MG tablet Take 5 mg by mouth daily as needed for anxiety.   Marland Kitchen levothyroxine (SYNTHROID, LEVOTHROID) 100 MCG tablet Take 100 mcg by mouth daily.  Marland Kitchen losartan-hydrochlorothiazide (HYZAAR) 100-12.5 MG tablet Take 1 tablet by mouth daily.  . metoprolol tartrate (LOPRESSOR) 25 MG tablet Take 1 tablet (25 mg total) by mouth 2 (two) times daily.  Marland Kitchen omeprazole (PRILOSEC) 20 MG capsule Take 20 mg by mouth daily.   . pentosan polysulfate (ELMIRON) 100 MG capsule Take 100 mg by mouth 3 (three) times daily.   . simvastatin (ZOCOR) 20 MG tablet Take 20 mg by mouth every  evening.    Allergies: Amoxicillin and Penicillins  Social History   Tobacco Use  . Smoking status: Never Smoker  . Smokeless tobacco: Never Used  Substance Use Topics  . Alcohol use: No    Alcohol/week: 0.0 standard drinks  . Drug use: No    Family History  Problem Relation Age of Onset  . Heart disease Mother   . Heart disease Father   . Diabetes Father   . Stroke Father   . Arthritis Father     Review of Systems: A 12-system review of systems  was performed and was negative except as noted in the HPI.  --------------------------------------------------------------------------------------------------  Physical Exam: BP 130/70 (BP Location: Left Arm, Patient Position: Sitting, Cuff Size: Normal)   Pulse (!) 134   Temp (!) 97.5 F (36.4 C)   Ht 5\' 3"  (1.6 m)   Wt 168 lb 4 oz (76.3 kg)   SpO2 96%   BMI 29.80 kg/m   General:  NAD.  Accompanied by her niece. HEENT: No conjunctival pallor or scleral icterus. Facemask in place. Neck: Supple without lymphadenopathy, thyromegaly, JVD, or HJR. Lungs: Normal work of breathing. Clear to auscultation bilaterally without wheezes or crackles. Heart: Tachycardic but regular without murmurs. Abd: Bowel sounds present. Soft, NT/ND without hepatosplenomegaly Ext: Trace pretibial edema bilaterally. Radial, PT, and DP pulses are 2+ bilaterally. Skin: Warm and dry without rash.  EKG:  Atrial flutter with 2:1 conduction, left axis deviation, borderline LVH, and non-specific ST/T changes.  Lab Results  Component Value Date   WBC 8.4 05/28/2019   HGB 13.8 05/28/2019   HCT 41.1 05/28/2019   MCV 91.5 05/28/2019   PLT 236 05/28/2019    Lab Results  Component Value Date   NA 140 05/28/2019   K 4.1 05/28/2019   CL 103 05/28/2019   CO2 27 05/28/2019   BUN 17 05/28/2019   CREATININE 0.78 05/28/2019   GLUCOSE 112 (H) 05/28/2019   ALT 12 05/28/2019    No results found for: CHOL, HDL, LDLCALC, LDLDIRECT, TRIG, CHOLHDL  --------------------------------------------------------------------------------------------------  ASSESSMENT AND PLAN: Atrial flutter: Patient likely reverted back to atrial flutter earlier this week, based on home heart rate readings.  She is modestly symptomatic but not in distress.  She reports being compliant with her medications, not having missed any apixaban or metoprolol.  I have recommended that we perform cardioversion on Friday.  In an effort to maximize  potential for maintenance of sinus rhythm, I will start amiodarone 400 mg TID.  I would like to load her as much as possible before repeating cardioversion, though I worry that she may not tolerate staying in flutter with a HR near 130 bpm until next week.  After a week of amiodarone 400 mg TID, she can decrease it to 200 mg daily.  She should continue her current dose of metoprolol; adjustments may need to be made based on heart rate following cardioversion.  I have discussed cardioversion, including risks and benefits with Meagan Wilcox and her niece; they have agreed to proceed.  Long term, Meagan Wilcox may need EP consultation to discuss ablative options.  Hypertension: BP upper normal today.  Continue current doses of metoprolol and losartan-HCTZ.  Follow-up: Return to clinic in 2 weeks.  Nelva Bush, MD 05/30/2019 7:41 AM

## 2019-05-30 ENCOUNTER — Other Ambulatory Visit
Admission: RE | Admit: 2019-05-30 | Discharge: 2019-05-30 | Disposition: A | Discharge status: Home / Self Care | Payer: Medicare Other | Source: Ambulatory Visit | Attending: Cardiovascular Disease | Admitting: Cardiovascular Disease

## 2019-05-30 ENCOUNTER — Other Ambulatory Visit: Payer: Self-pay

## 2019-05-30 ENCOUNTER — Encounter: Payer: Self-pay | Admitting: Internal Medicine

## 2019-05-30 DIAGNOSIS — Z20828 Contact with and (suspected) exposure to other viral communicable diseases: Secondary | ICD-10-CM | POA: Diagnosis not present

## 2019-05-30 DIAGNOSIS — Z01812 Encounter for preprocedural laboratory examination: Secondary | ICD-10-CM | POA: Insufficient documentation

## 2019-05-30 LAB — SARS CORONAVIRUS 2 (TAT 6-24 HRS): SARS Coronavirus 2: NEGATIVE

## 2019-05-30 NOTE — Addendum Note (Signed)
Addended by: Dwanna Goshert A on: 05/30/2019 09:32 AM   Modules accepted: Orders, SmartSet

## 2019-05-31 ENCOUNTER — Ambulatory Visit: Payer: Medicare Other | Admitting: Certified Registered"

## 2019-05-31 ENCOUNTER — Telehealth: Payer: Self-pay | Admitting: Internal Medicine

## 2019-05-31 ENCOUNTER — Ambulatory Visit
Admission: RE | Admit: 2019-05-31 | Discharge: 2019-05-31 | Disposition: A | Discharge status: Home / Self Care | Payer: Medicare Other | Attending: Cardiovascular Disease | Admitting: Cardiovascular Disease

## 2019-05-31 ENCOUNTER — Encounter: Payer: Self-pay | Admitting: *Deleted

## 2019-05-31 ENCOUNTER — Encounter
Admission: RE | Disposition: A | Discharge status: Home / Self Care | Payer: Self-pay | Source: Home / Self Care | Attending: Cardiovascular Disease

## 2019-05-31 DIAGNOSIS — R0602 Shortness of breath: Secondary | ICD-10-CM | POA: Diagnosis not present

## 2019-05-31 DIAGNOSIS — I10 Essential (primary) hypertension: Secondary | ICD-10-CM | POA: Diagnosis not present

## 2019-05-31 DIAGNOSIS — I471 Supraventricular tachycardia: Secondary | ICD-10-CM | POA: Insufficient documentation

## 2019-05-31 DIAGNOSIS — K219 Gastro-esophageal reflux disease without esophagitis: Secondary | ICD-10-CM | POA: Diagnosis not present

## 2019-05-31 DIAGNOSIS — I4892 Unspecified atrial flutter: Secondary | ICD-10-CM | POA: Diagnosis present

## 2019-05-31 DIAGNOSIS — Z79890 Hormone replacement therapy (postmenopausal): Secondary | ICD-10-CM | POA: Insufficient documentation

## 2019-05-31 DIAGNOSIS — E039 Hypothyroidism, unspecified: Secondary | ICD-10-CM | POA: Diagnosis not present

## 2019-05-31 DIAGNOSIS — I4891 Unspecified atrial fibrillation: Secondary | ICD-10-CM | POA: Diagnosis not present

## 2019-05-31 DIAGNOSIS — E785 Hyperlipidemia, unspecified: Secondary | ICD-10-CM | POA: Insufficient documentation

## 2019-05-31 DIAGNOSIS — I483 Typical atrial flutter: Secondary | ICD-10-CM | POA: Diagnosis not present

## 2019-05-31 DIAGNOSIS — Z79899 Other long term (current) drug therapy: Secondary | ICD-10-CM | POA: Diagnosis not present

## 2019-05-31 DIAGNOSIS — Z7901 Long term (current) use of anticoagulants: Secondary | ICD-10-CM | POA: Diagnosis not present

## 2019-05-31 HISTORY — PX: CARDIOVERSION: SHX1299

## 2019-05-31 SURGERY — CARDIOVERSION
Anesthesia: General

## 2019-05-31 MED ORDER — PROPOFOL 500 MG/50ML IV EMUL
INTRAVENOUS | Status: AC
  Filled 2019-05-31: qty 50

## 2019-05-31 MED ORDER — EPHEDRINE SULFATE 50 MG/ML IJ SOLN
INTRAMUSCULAR | Status: DC | PRN
  Administered 2019-05-31 (×2): 5 mg via INTRAVENOUS

## 2019-05-31 MED ORDER — AMIODARONE HCL 200 MG PO TABS: ORAL_TABLET | ORAL | 2 refills | Status: DC

## 2019-05-31 MED ORDER — SODIUM CHLORIDE 0.9 % IV SOLN
INTRAVENOUS | Status: DC | PRN
  Administered 2019-05-31: 08:00:00 via INTRAVENOUS

## 2019-05-31 MED ORDER — PROPOFOL 10 MG/ML IV BOLUS
INTRAVENOUS | Status: DC | PRN
  Administered 2019-05-31: 70 mg via INTRAVENOUS

## 2019-05-31 NOTE — Anesthesia Preprocedure Evaluation (Signed)
Anesthesia Evaluation  Patient identified by MRN, date of birth, ID band Patient awake    Reviewed: Allergy & Precautions, NPO status , Patient's Chart, lab work & pertinent test results  History of Anesthesia Complications Negative for: history of anesthetic complications  Airway Mallampati: III       Dental   Pulmonary neg sleep apnea, neg COPD, Not current smoker,           Cardiovascular hypertension, Pt. on medications (-) Past MI and (-) CHF + dysrhythmias Atrial Fibrillation + Valvular Problems/Murmurs MVP      Neuro/Psych neg Seizures    GI/Hepatic Neg liver ROS, GERD  Medicated and Controlled,  Endo/Other  neg diabetesHypothyroidism   Renal/GU negative Renal ROS     Musculoskeletal   Abdominal   Peds  Hematology   Anesthesia Other Findings   Reproductive/Obstetrics                             Anesthesia Physical Anesthesia Plan  ASA: III  Anesthesia Plan: General   Post-op Pain Management:    Induction: Intravenous  PONV Risk Score and Plan: 3  Airway Management Planned: Nasal Cannula  Additional Equipment:   Intra-op Plan:   Post-operative Plan:   Informed Consent: I have reviewed the patients History and Physical, chart, labs and discussed the procedure including the risks, benefits and alternatives for the proposed anesthesia with the patient or authorized representative who has indicated his/her understanding and acceptance.       Plan Discussed with:   Anesthesia Plan Comments:         Anesthesia Quick Evaluation

## 2019-05-31 NOTE — Transfer of Care (Signed)
Immediate Anesthesia Transfer of Care Note  Patient: DAWNN NAM  Procedure(s) Performed: CARDIOVERSION (N/A )  Patient Location: PACU and Cath Lab  Anesthesia Type:General  Level of Consciousness: awake, oriented and drowsy  Airway & Oxygen Therapy: Patient Spontanous Breathing and Patient connected to nasal cannula oxygen  Post-op Assessment: Report given to RN and Post -op Vital signs reviewed and stable  Post vital signs: Reviewed and stable  Last Vitals:  Vitals Value Taken Time  BP 93/46 05/31/19 0756  Temp    Pulse 49 05/31/19 0757  Resp 11 05/31/19 0757  SpO2 96 % 05/31/19 0757  Vitals shown include unvalidated device data.  Last Pain:  Vitals:   05/31/19 0725  TempSrc:   PainSc: 0-No pain         Complications: No apparent anesthesia complications

## 2019-05-31 NOTE — CV Procedure (Signed)
Cardioversion procedure note For atrial tachycardia  Procedure Details:  Consent: Risks of procedure as well as the alternatives and risks of each were explained to the (patient/caregiver). Consent for procedure obtained.  Time Out: Verified patient identification, verified procedure, site/side was marked, verified correct patient position, special equipment/implants available, medications/allergies/relevent history reviewed, required imaging and test results available. Performed  Patient placed on cardiac monitor, pulse oximetry, supplemental oxygen as necessary.  Sedation given: propofol IV, Dr. Dennard Nip Pacer pads placed anterior and posterior chest.   Cardioverted 1 time(s).  Cardioverted at  150J. Synchronized biphasic Converted to NSR   Evaluation: Findings: Post procedure EKG shows: NSR Complications: None Patient did tolerate procedure well.  Time Spent Directly with the Patient:  30 minutes   Esmond Plants, M.D., Ph.D.

## 2019-05-31 NOTE — Anesthesia Postprocedure Evaluation (Signed)
Anesthesia Post Note  Patient: Meagan Wilcox  Procedure(s) Performed: CARDIOVERSION (N/A )  Patient location during evaluation: PACU Anesthesia Type: General Level of consciousness: awake and alert Pain management: pain level controlled Vital Signs Assessment: post-procedure vital signs reviewed and stable Respiratory status: spontaneous breathing and respiratory function stable Cardiovascular status: stable Anesthetic complications: no     Last Vitals:  Vitals:   05/31/19 0830 05/31/19 0900  BP: (!) 116/59 (!) 120/53  Pulse: (!) 56 (!) 57  Resp: (!) 21 15  Temp:    SpO2: 99% 100%    Last Pain:  Vitals:   05/31/19 0900  TempSrc:   PainSc: 0-No pain                 Draco Malczewski K

## 2019-05-31 NOTE — Telephone Encounter (Signed)
Niece of the patient was calling back about medication management following a cardioversion. She states someone from the office tried to call her, but there was some technical difficulty and the office could not hear her.  You can contact her on the patient's home phone listed in the chart

## 2019-05-31 NOTE — Telephone Encounter (Signed)
Per [place] patient calling back with VS to see if medications should be taken. After cardioversion   VA per daughter 113/50 HR 59     Please advise if amiodarone should be taken .

## 2019-05-31 NOTE — Telephone Encounter (Signed)
Question is probably related to metoprolol/amiodarone dosing.  Please contact Dr. Rockey Situ regarding his recommendations following this morning's cardioversion.  Thanks.  Nelva Bush, MD [facility] Pager: [phone]

## 2019-05-31 NOTE — Discharge Instructions (Signed)
Monitored Anesthesia Care, Care After °These instructions provide you with information about caring for yourself after your procedure. Your health care provider may also give you more specific instructions. Your treatment has been planned according to current medical practices, but problems sometimes occur. Call your health care provider if you have any problems or questions after your procedure. °What can I expect after the procedure? °After your procedure, you may: °· Feel sleepy for several hours. °· Feel clumsy and have poor balance for several hours. °· Feel forgetful about what happened after the procedure. °· Have poor judgment for several hours. °· Feel nauseous or vomit. °· Have a sore throat if you had a breathing tube during the procedure. °Follow these instructions at home: °For at least 24 hours after the procedure: ° °  ° °· Have a responsible adult stay with you. It is important to have someone help care for you until you are awake and alert. °· Rest as needed. °· Do not: °? Participate in activities in which you could fall or become injured. °? Drive. °? Use heavy machinery. °? Drink alcohol. °? Take sleeping pills or medicines that cause drowsiness. °? Make important decisions or sign legal documents. °? Take care of children on your own. °Eating and drinking °· Follow the diet that is recommended by your health care provider. °· If you vomit, drink water, juice, or soup when you can drink without vomiting. °· Make sure you have little or no nausea before eating solid foods. °General instructions °· Take over-the-counter and prescription medicines only as told by your health care provider. °· If you have sleep apnea, surgery and certain medicines can increase your risk for breathing problems. Follow instructions from your health care provider about wearing your sleep device: °? Anytime you are sleeping, including during daytime naps. °? While taking prescription pain medicines, sleeping medicines,  or medicines that make you drowsy. °· If you smoke, do not smoke without supervision. °· Keep all follow-up visits as told by your health care provider. This is important. °Contact a health care provider if: °· You keep feeling nauseous or you keep vomiting. °· You feel light-headed. °· You develop a rash. °· You have a fever. °Get help right away if: °· You have trouble breathing. °Summary °· For several hours after your procedure, you may feel sleepy and have poor judgment. °· Have a responsible adult stay with you for at least 24 hours or until you are awake and alert. °This information is not intended to replace advice given to you by your health care provider. Make sure you discuss any questions you have with your health care provider. °Document Released: 11/22/2015 Document Revised: 10/30/2017 Document Reviewed: 11/22/2015 °Elsevier Patient Education © 2020 [employer] °Electrical Cardioversion, Care After °This sheet gives you information about how to care for yourself after your procedure. Your health care provider may also give you more specific instructions. If you have problems or questions, contact your health care provider. °What can I expect after the procedure? °After the procedure, it is common to have: °· Some redness on the skin where the shocks were given. °Follow these instructions at home: ° °· Do not drive for 24 hours if you were given a medicine to help you relax (sedative). °· Take over-the-counter and prescription medicines only as told by your health care provider. °· Ask your health care provider how to check your pulse. Check it often. °· Rest for 48 hours after the procedure or as told   by your health care provider. °· Avoid or limit your caffeine use as told by your health care provider. °Contact a health care provider if: °· You feel like your heart is beating too quickly or your pulse is not regular. °· You have a serious muscle cramp that does not go away. °Get help right away  if: ° °· You have discomfort in your chest. °· You are dizzy or you feel faint. °· You have trouble breathing or you are short of breath. °· Your speech is slurred. °· You have trouble moving an arm or leg on one side of your body. °· Your fingers or toes turn cold or blue. °This information is not intended to replace advice given to you by your health care provider. Make sure you discuss any questions you have with your health care provider. °Document Released: 05/22/2013 Document Revised: 07/14/2017 Document Reviewed: 02/05/2016 °Elsevier Patient Education © 2020 [employer] ° °

## 2019-05-31 NOTE — Anesthesia Post-op Follow-up Note (Signed)
Anesthesia QCDR form completed.        

## 2019-05-31 NOTE — Telephone Encounter (Signed)
Returned call to niece. She reported that Dr. Rockey Situ advised her to call with VS when patient returned home so he could advise on amiodarone dosing going forward as HR was low after DCCV.   I have not heard back at this time and sent staff message to Dr. Rockey Situ.   Per niece, she has already missed the window for 2nd dose and will hold at this time.   Current vs, 113/50 HR 59.   Update: received staff message back from Dr. Rockey Situ "would take amiodarone 200 BID with metoprolol 25 BID".  Updated niece and made update to chart.   Advised pt to call for any further questions or concerns.

## 2019-06-02 NOTE — H&P (Signed)
H&P Addendum, pre-cardioversion  Patient was seen and evaluated prior to -cardioversion procedure Symptoms, prior testing details again confirmed with the patient Patient examined, no significant change from prior exam Lab work reviewed in detail personally by myself Patient understands risk and benefit of the procedure, willing to proceed  Signed, Tim Chandni Gagan, MD, Ph.D [facility]  

## 2019-06-07 ENCOUNTER — Ambulatory Visit (INDEPENDENT_AMBULATORY_CARE_PROVIDER_SITE_OTHER): Payer: Medicare Other | Admitting: Internal Medicine

## 2019-06-07 ENCOUNTER — Encounter: Payer: Self-pay | Admitting: Internal Medicine

## 2019-06-07 ENCOUNTER — Other Ambulatory Visit: Payer: Self-pay

## 2019-06-07 VITALS — BP 150/68 | HR 51 | Ht 64.0 in | Wt 167.2 lb

## 2019-06-07 DIAGNOSIS — I10 Essential (primary) hypertension: Secondary | ICD-10-CM | POA: Diagnosis not present

## 2019-06-07 DIAGNOSIS — I484 Atypical atrial flutter: Secondary | ICD-10-CM | POA: Diagnosis not present

## 2019-06-07 MED ORDER — APIXABAN 5 MG PO TABS: 5.0000 mg | ORAL_TABLET | Freq: Two times a day (BID) | ORAL | 3 refills | Status: DC

## 2019-06-07 MED ORDER — AMIODARONE HCL 200 MG PO TABS: ORAL_TABLET | ORAL | Status: DC

## 2019-06-07 NOTE — Progress Notes (Signed)
Follow-up Outpatient Visit Date: 06/07/2019  Primary Care Provider: Cletis Athens, MD [address] [place] [address]  Chief Complaint: Fatigue  HPI:  Meagan Wilcox is a 85 y.o. year-old female with history of atrial fibrillation/flutter, hypertension, hyperlipidemia, hypothyroidism, GERD, and arthritis, who presents for follow-up of atrial fibrillation/flutter.  I last saw Meagan Wilcox a week ago at which time she was back in atrial flutter.  This had been accompanied by worsening fatigue and dyspnea with minimal activity.  Ventricular rates were suboptimally controlled, near 130 bpm.  She was started on amiodarone and underwent successful cardioversion with Dr. Rockey Situ on 05/31/2019.  Today, Meagan Wilcox reports that she feels better than at our last visit.  However, she continues to be fatigued with modest activity.  She also notes mild nausea, though this improved with slowing of amiodarone load following last week's cardioversion.  She denies chest pain, palpitations, lightheadedness, edema, and shortness of breath.  She has not had any significant bleeding and remains complaint with her medications.  On one occasion, she accidentally took her medications twice and noticed her heart rate dip to 46 bpm.  --------------------------------------------------------------------------------------------------  Past Medical History:  Diagnosis Date  . Abnormal mammogram, unspecified 2011  . Arthritis 2002  . GERD (gastroesophageal reflux disease)   . Heart murmur 1985  . Hyperlipidemia   . Hypertension 2005  . Hypothyroidism    Past Surgical History:  Procedure Laterality Date  . APPENDECTOMY  1939  . BREAST BIOPSY Left 02-24-14   benign  . BREAST BIOPSY Left 02/11/2016   neg  . BREAST BIOPSY Left 2013   neg  . BREAST EXCISIONAL BIOPSY Left 2014   neg  . BREAST SURGERY Left 2013   stereotactic biopsy   . BREAST SURGERY Left May 2010   1 mm foci of atypical ductal hyperplasia  .  CARDIOVERSION N/A 05/08/2019   Procedure: CARDIOVERSION;  Surgeon: Kate Sable, MD;  Location: [facility];  Service: Cardiovascular;  Laterality: N/A;  . CARDIOVERSION N/A 05/31/2019   Procedure: CARDIOVERSION;  Surgeon: Minna Merritts, MD;  Location: [facility];  Service: Cardiovascular;  Laterality: N/A;  . cataract surgery  Bilateral 2009  . COLONOSCOPY  2010   Dr. Vira Agar  . left breast biopsy   2009  . TEE WITHOUT CARDIOVERSION N/A 05/08/2019   Procedure: TRANSESOPHAGEAL ECHOCARDIOGRAM (TEE);  Surgeon: Kate Sable, MD;  Location: [facility];  Service: Cardiovascular;  Laterality: N/A;    Current Meds  Medication Sig  . amiodarone (PACERONE) 200 MG tablet Take 1 tablet (200 mg) by mouth once daily  . amitriptyline (ELAVIL) 25 MG tablet Take 25 mg by mouth at bedtime.  Marland Kitchen apixaban (ELIQUIS) 5 MG TABS tablet Take 1 tablet (5 mg total) by mouth 2 (two) times daily.  . Biotin 10 MG TABS Take 10 mg by mouth daily.   . diazepam (VALIUM) 5 MG tablet Take 5 mg by mouth daily as needed for anxiety.   Marland Kitchen levothyroxine (SYNTHROID, LEVOTHROID) 100 MCG tablet Take 100 mcg by mouth daily.  Marland Kitchen losartan-hydrochlorothiazide (HYZAAR) 100-12.5 MG tablet Take 1 tablet by mouth daily.  . naproxen sodium (ALEVE) 220 MG tablet Take 220 mg by mouth daily as needed (Back pain).  Marland Kitchen omeprazole (PRILOSEC) 20 MG capsule Take 20 mg by mouth daily.   . pentosan polysulfate (ELMIRON) 100 MG capsule Take 100 mg by mouth 2 (two) times daily.   . simvastatin (ZOCOR) 20 MG tablet Take 20 mg by mouth every evening.  . [DISCONTINUED]  amiodarone (PACERONE) 200 MG tablet Take 1 tablet (200 mg) by mouth 2 times daily.  . [DISCONTINUED] apixaban (ELIQUIS) 5 MG TABS tablet Take 1 tablet (5 mg total) by mouth 2 (two) times daily.  . [DISCONTINUED] metoprolol tartrate (LOPRESSOR) 25 MG tablet Take 1 tablet (25 mg total) by mouth 2 (two) times daily.    Allergies: Amoxicillin and Penicillins  Social History   Tobacco  Use  . Smoking status: Never Smoker  . Smokeless tobacco: Never Used  Substance Use Topics  . Alcohol use: No    Alcohol/week: 0.0 standard drinks  . Drug use: No    Family History  Problem Relation Age of Onset  . Heart disease Mother   . Heart disease Father   . Diabetes Father   . Stroke Father   . Arthritis Father     Review of Systems: A 12-system review of systems was performed and was negative except as noted in the HPI.  --------------------------------------------------------------------------------------------------  Physical Exam: BP (!) 150/68 (BP Location: Left Arm, Patient Position: Sitting, Cuff Size: Normal)   Pulse (!) 51   Ht 5\' 4"  (1.626 m)   Wt 167 lb 4 oz (75.9 kg)   SpO2 97%   BMI 28.71 kg/m   General:  NAD. Accompanied by her niece. HEENT: No conjunctival pallor or scleral icterus. Facemask in place. Neck: No lymphadenopathy, thyromegaly, JVD, or HJR. Lungs: Normal work of breathing. Clear to auscultation bilaterally without wheezes or crackles. Heart: Bradycardic but regular without murmurs, rubs, or gallops. Abd: Bowel sounds present. Soft, NT/ND without hepatosplenomegaly Ext: No lower extremity edema. Radial, PT, and DP pulses are 2+ bilaterally. Skin: Warm and dry without rash.  EKG:  Sinus bradycardia with borderline LVH.  Heart rate 51 bpm.  Lab Results  Component Value Date   WBC 8.4 05/28/2019   HGB 13.8 05/28/2019   HCT 41.1 05/28/2019   MCV 91.5 05/28/2019   PLT 236 05/28/2019    Lab Results  Component Value Date   NA 140 05/28/2019   K 4.1 05/28/2019   CL 103 05/28/2019   CO2 27 05/28/2019   BUN 17 05/28/2019   CREATININE 0.78 05/28/2019   GLUCOSE 112 (H) 05/28/2019   ALT 12 05/28/2019    No results found for: CHOL, HDL, LDLCALC, LDLDIRECT, TRIG, CHOLHDL  --------------------------------------------------------------------------------------------------  ASSESSMENT AND PLAN: Atrial fibrillation/flutter: Meagan Wilcox  is maintaining sinus rhythm today following cardioversion last week for recurrent atrial flutter.  I have recommended that she continue amiodarone 200 mg BID for 10 more days to complete her load.  She should decrease this to 200 mg daily thereafter.  Due to fatigue and resting bradycardia, I will have her stop taking metoprolol.  She will need to remain on apixaban 5 mg BID.  Hypertension: BP mildly elevated today but typically better at home.  I have asked Meagan Wilcox to monitor her BP and to let is know if it is consistent above 140/90, particularly since we will be stopping metoprolol.  She should continue her current dose of losartan-HCTZ.  Follow-up: Return to clinic in 4-6 weeks.  Theresia Lo, MD 06/07/2019 10:25 PM

## 2019-06-07 NOTE — Patient Instructions (Signed)
Medication Instructions:  - Your physician has recommended you make the following change in your medication:   1) Stop metoprolol  2) Amiodarone 200 mg: - continue to take 1 tablet (200 mg) by mouth twice daily x 10 more days, then - decrease to 1 tablet (200 mg) by mouth once daily  *If you need a refill on your cardiac medications before your next appointment, please call your pharmacy*  Lab Work: - none ordered  If you have labs (blood work) drawn today and your tests are completely normal, you will receive your results only by: Marland Kitchen MyChart Message (if you have MyChart) OR . A paper copy in the mail If you have any lab test that is abnormal or we need to change your treatment, we will call you to review the results.  Testing/Procedures: - none ordered  Follow-Up: At [facility], you and your health needs are our priority.  As part of our continuing mission to provide you with exceptional heart care, we have created designated Provider Care Teams.  These Care Teams include your primary Cardiologist (physician) and Advanced Practice Providers (APPs -  Physician Assistants and Nurse Practitioners) who all work together to provide you with the care you need, when you need it.  Your next appointment:   4-6 weeks  The format for your next appointment:   In Person  Provider:    You may see Nelva Bush, MD or one of the following Advanced Practice Providers on your designated Care Team:    Murray Hodgkins, NP  Christell Faith, PA-C  Marrianne Mood, PA-C   Other Instructions - n/a

## 2019-06-12 ENCOUNTER — Ambulatory Visit: Payer: Medicare Other | Admitting: Internal Medicine

## 2019-06-17 ENCOUNTER — Other Ambulatory Visit: Payer: Self-pay

## 2019-06-17 DIAGNOSIS — Z1231 Encounter for screening mammogram for malignant neoplasm of breast: Secondary | ICD-10-CM

## 2019-06-17 NOTE — Addendum Note (Signed)
Addended by: Britt Bottom on: 06/17/2019 02:51 PM   Modules accepted: Orders

## 2019-07-19 ENCOUNTER — Ambulatory Visit (INDEPENDENT_AMBULATORY_CARE_PROVIDER_SITE_OTHER): Payer: Medicare Other | Admitting: Nurse Practitioner

## 2019-07-19 ENCOUNTER — Other Ambulatory Visit: Payer: Self-pay

## 2019-07-19 ENCOUNTER — Encounter: Payer: Self-pay | Admitting: Nurse Practitioner

## 2019-07-19 VITALS — BP 138/72 | HR 58 | Ht 64.0 in | Wt 165.5 lb

## 2019-07-19 DIAGNOSIS — I484 Atypical atrial flutter: Secondary | ICD-10-CM | POA: Diagnosis not present

## 2019-07-19 DIAGNOSIS — I10 Essential (primary) hypertension: Secondary | ICD-10-CM | POA: Diagnosis not present

## 2019-07-19 DIAGNOSIS — I4819 Other persistent atrial fibrillation: Secondary | ICD-10-CM

## 2019-07-19 MED ORDER — AMLODIPINE BESYLATE 2.5 MG PO TABS: 2.5000 mg | ORAL_TABLET | Freq: Every day | ORAL | 3 refills | Status: DC

## 2019-07-19 NOTE — Progress Notes (Signed)
Office Visit    Patient Name: Meagan Wilcox Date of Encounter: 07/19/2019  Primary Care Provider:  Cletis Athens, MD Primary Cardiologist:  Nelva Bush, MD  Chief Complaint    85 year old female with a history of atrial fibrillation/flutter, hypertension, hyperlipidemia, hypothyroidism, GERD, and arthritis, who presents for follow-up of A. fib/flutter.  Past Medical History    Past Medical History:  Diagnosis Date  . Abnormal mammogram, unspecified 2011  . Arthritis 2002  . GERD (gastroesophageal reflux disease)   . Heart murmur 1985  . Hyperlipidemia   . Hypertension 2005  . Hypothyroidism    Past Surgical History:  Procedure Laterality Date  . APPENDECTOMY  1939  . BREAST BIOPSY Left 02-24-14   benign  . BREAST BIOPSY Left 02/11/2016   neg  . BREAST BIOPSY Left 2013   neg  . BREAST EXCISIONAL BIOPSY Left 2014   neg  . BREAST SURGERY Left 2013   stereotactic biopsy   . BREAST SURGERY Left May 2010   1 mm foci of atypical ductal hyperplasia  . CARDIOVERSION N/A 05/08/2019   Procedure: CARDIOVERSION;  Surgeon: Kate Sable, MD;  Location: [facility] ORS;  Service: Cardiovascular;  Laterality: N/A;  . CARDIOVERSION N/A 05/31/2019   Procedure: CARDIOVERSION;  Surgeon: Minna Merritts, MD;  Location: [facility] ORS;  Service: Cardiovascular;  Laterality: N/A;  . cataract surgery  Bilateral 2009  . COLONOSCOPY  2010   Dr. Vira Agar  . left breast biopsy   2009  . TEE WITHOUT CARDIOVERSION N/A 05/08/2019   Procedure: TRANSESOPHAGEAL ECHOCARDIOGRAM (TEE);  Surgeon: Kate Sable, MD;  Location: [facility] ORS;  Service: Cardiovascular;  Laterality: N/A;    Allergies  Allergies  Allergen Reactions  . Amoxicillin Diarrhea  . Penicillins Rash    Did it involve swelling of the face/tongue/throat, SOB, or low BP? No Did it involve sudden or severe rash/hives, skin peeling, or any reaction on the inside of your mouth or nose? No Did you need to seek medical attention at a  hospital or doctor's office? Yes When did it last happen?65 years ago If all above answers are "NO", may proceed with cephalosporin use.    History of Present Illness    85 year old female with the above past medical history including atrial fibrillation/flutter, hypertension, hyperlipidemia, hypothyroidism, GERD, and arthritis.  In September of this year, she was diagnosed with atrial fibrillation/atypical atrial flutter and underwent TEE guided cardioversion.  In October, she noted an episode of tachypalpitations followed by fatigue and was seen in the emergency department.  She was found to be in atrial flutter with 2-1 block.  Rate did not improve with IV metoprolol and oral metoprolol dose was adjusted.  She followed up with Dr. Saunders Revel on October 14, and she was placed on amiodarone with a plan for cardioversion, which was successfully performed on October 16.  Post cardioversion, she noted mild nausea and fatigue and her amiodarone dose was reduced to 200 mg twice daily with a plan to drop to 200 mg daily in November.  Over the past month, she has been doing well.  She lives alone and remains independent with her niece is helping out from time to time.  She has noted improved energy and is tolerating amiodarone 200 mg daily along with Eliquis, quite well.  She has had no further significant fatigue or nausea.  She denies chest pain but does sometimes experience dyspnea on exertion and vacuuming her room.  This has been a chronic issue.  She has  been monitoring her blood pressure closely and largely trending in the 140s to 160s.  She denies palpitations, PND, orthopnea, dizziness, syncope, edema, or early satiety.  Home Medications    Prior to Admission medications   Medication Sig Start Date End Date Taking? Authorizing Provider  amiodarone (PACERONE) 200 MG tablet Take 1 tablet (200 mg) by mouth once daily 06/07/19  Yes End, Cristal Deerhristopher, MD  amitriptyline (ELAVIL) 25 MG tablet Take 25 mg by  mouth at bedtime.   Yes [provider]  apixaban (ELIQUIS) 5 MG TABS tablet Take 1 tablet (5 mg total) by mouth 2 (two) times daily. 06/07/19  Yes End, Cristal Deerhristopher, MD  Biotin 10 MG TABS Take 10 mg by mouth daily.    Yes [provider]  diazepam (VALIUM) 5 MG tablet Take 5 mg by mouth daily as needed for anxiety.    Yes [provider]  levothyroxine (SYNTHROID, LEVOTHROID) 100 MCG tablet Take 100 mcg by mouth daily.   Yes [provider]  losartan-hydrochlorothiazide (HYZAAR) 100-12.5 MG tablet Take 1 tablet by mouth daily.   Yes [provider]  naproxen sodium (ALEVE) 220 MG tablet Take 220 mg by mouth daily as needed (Back pain).   Yes [provider]  omeprazole (PRILOSEC) 20 MG capsule Take 20 mg by mouth daily.    Yes [provider]  pentosan polysulfate (ELMIRON) 100 MG capsule Take 100 mg by mouth 2 (two) times daily.    Yes [provider]  simvastatin (ZOCOR) 20 MG tablet Take 20 mg by mouth every evening.   Yes [provider]    Review of Systems    Some degree of chronic dyspnea on exertion with higher levels of activity such as vacuuming her room.  She denies palpitations, PND, orthopnea, dizziness, syncope, edema, or early satiety.  All other systems reviewed and are otherwise negative except as noted above.  Physical Exam    VS:  BP 138/72 (BP Location: Left Arm, Patient Position: Sitting, Cuff Size: Normal)   Pulse (!) 58   Ht 5\' 4"  (1.626 m)   Wt 165 lb 8 oz (75.1 kg)   BMI 28.41 kg/m  , BMI Body mass index is 28.41 kg/m. GEN: Well nourished, well developed, in no acute distress. HEENT: normal. Neck: Supple, no JVD, carotid bruits, or masses. Cardiac: RRR, no murmurs, rubs, or gallops. No clubbing, cyanosis, edema.  Radials/PT 2+ and equal bilaterally.  Respiratory:  Respirations regular and unlabored, clear to auscultation bilaterally. GI: Soft, nontender, nondistended, BS + x 4. MS:  no deformity or atrophy. Skin: warm and dry, no rash. Neuro:  Strength and sensation are intact. Psych: Normal affect.  Accessory Clinical Findings    ECG personally reviewed by me today -sinus bradycardia, 58, left axis deviation, borderline LVH (aVL)- no acute changes.  Lab Results  Component Value Date   WBC 8.4 05/28/2019   HGB 13.8 05/28/2019   HCT 41.1 05/28/2019   MCV 91.5 05/28/2019   PLT 236 05/28/2019   Lab Results  Component Value Date   CREATININE 0.78 05/28/2019   BUN 17 05/28/2019   NA 140 05/28/2019   K 4.1 05/28/2019   CL 103 05/28/2019   CO2 27 05/28/2019   Lab Results  Component Value Date   ALT 12 05/28/2019   AST 17 05/28/2019   ALKPHOS 46 05/28/2019   BILITOT 0.6 05/28/2019    Assessment & Plan    1.  Persistent atrial fibrillation/atypical atrial flutter: Status post initial  cardioversion in September with subsequent amiodarone loading and repeat cardioversion in the setting of recurrent a flutter in October.  She remains on amiodarone 200 mg daily along with Eliquis and has been tolerating both well.  Fatigue and nausea that she experienced during amiodarone loading has subsided.  She remains in sinus bradycardia today.  Beta-blocker previously discontinued in October.  No changes to medications today with plan for follow-up labs at return visit in 2 to 3 months.  2.  Essential hypertension: Blood pressure has been trending in the 140s to the 160s at home.  She is currently on losartan-HCTZ.  As noted, beta-blocker was previously discontinued secondary to bradycardia, in October.  I will add low-dose amlodipine 2.5 mg daily.  She will continue to check her blood pressure at home and contact us in 2 weeks if pressures continue to trend greater than 140.  3.  Hyperlipidemia: On statin therapy.  This has been followed by her primary care provider.  4.  Osteoarthritis: She has been using Aleve several times per week.  She asked whether or not she should  change to Tylenol.  I did recommend that given Eliquis therapy with increased risk of GI bleeding, that we would prefer Tylenol on a as needed basis.  She was agreeable to try this.  5.  Hypothyroidism: On Synthroid.  TSH 2.687 in October.  Plan to follow-up in approximately 3 months.  6.  Disposition: Patient will contact us if pressures continue to trend greater than 140 systolic, at which time we can titrate amlodipine to 5 mg daily.  Otherwise plan to follow-up in 2 to 3 months or sooner if necessary with follow-up amiodarone labs at that time.  Nicolasa Ducking, NP 07/19/2019, 9:53 AM

## 2019-07-19 NOTE — Patient Instructions (Addendum)
Medication Instructions:  Your physician has recommended you make the following change in your medication:  1. START Amlodipine 2.5 mg once daily  Call if blood pressures remain elevated greater than 140/90 in two weeks.   *If you need a refill on your cardiac medications before your next appointment, please call your pharmacy*  Lab Work: None  If you have labs (blood work) drawn today and your tests are completely normal, you will receive your results only by: Marland Kitchen MyChart Message (if you have MyChart) OR . A paper copy in the mail If you have any lab test that is abnormal or we need to change your treatment, we will call you to review the results.  Testing/Procedures: None  Follow-Up: At [facility], you and your health needs are our priority.  As part of our continuing mission to provide you with exceptional heart care, we have created designated Provider Care Teams.  These Care Teams include your primary Cardiologist (physician) and Advanced Practice Providers (APPs -  Physician Assistants and Nurse Practitioners) who all work together to provide you with the care you need, when you need it.  Your next appointment:   3 month(s)  The format for your next appointment:   In Person  Provider:    You may see Nelva Bush, MD or one of the following Advanced Practice Providers on your designated Care Team:    Murray Hodgkins, NP  Christell Faith, PA-C  Marrianne Mood, PA-C   Other Instructions Your physician has requested that you regularly monitor and record your blood pressure readings at home. Please use the same machine at the same time of day to check your readings and record them to bring to your follow-up visit.  Please review information below on monitoring blood pressures.   How to Take Your Blood Pressure You can take your blood pressure at home with a machine. You may need to check your blood pressure at home:  To check if you have high blood pressure  (hypertension).  To check your blood pressure over time.  To make sure your blood pressure medicine is working. Supplies needed: You will need a blood pressure machine, or monitor. You can buy one at a drugstore or online. When choosing one:  Choose one with an arm cuff.  Choose one that wraps around your upper arm. Only one finger should fit between your arm and the cuff.  Do not choose one that measures your blood pressure from your wrist or finger. Your doctor can suggest a monitor. How to prepare Avoid these things for 30 minutes before checking your blood pressure:  Drinking caffeine.  Drinking alcohol.  Eating.  Smoking.  Exercising. Five minutes before checking your blood pressure:  Pee.  Sit in a dining chair. Avoid sitting in a soft couch or armchair.  Be quiet. Do not talk. How to take your blood pressure Follow the instructions that came with your machine. If you have a digital blood pressure monitor, these may be the instructions: 1. Sit up straight. 2. Place your feet on the floor. Do not cross your ankles or legs. 3. Rest your left arm at the level of your heart. You may rest it on a table, desk, or chair. 4. Pull up your shirt sleeve. 5. Wrap the blood pressure cuff around the upper part of your left arm. The cuff should be 1 inch (2.5 cm) above your elbow. It is best to wrap the cuff around bare skin. 6. Fit the cuff snugly  around your arm. You should be able to place only one finger between the cuff and your arm. 7. Put the cord inside the groove of your elbow. 8. Press the power button. 9. Sit quietly while the cuff fills with air and loses air. 10. Write down the numbers on the screen. 11. Wait 2-3 minutes and then repeat steps 1-10. What do the numbers mean? Two numbers make up your blood pressure. The first number is called systolic pressure. The second is called diastolic pressure. An example of a blood pressure reading is "120 over 80" (or  120/80). If you are an adult and do not have a medical condition, use this guide to find out if your blood pressure is normal: Normal  First number: below 120.  Second number: below 80. Elevated  First number: 120-129.  Second number: below 80. Hypertension stage 1  First number: 130-139.  Second number: 80-89. Hypertension stage 2  First number: 140 or above.  Second number: 90 or above. Your blood pressure is above normal even if only the top or bottom number is above normal. Follow these instructions at home:  Check your blood pressure as often as your doctor tells you to.  Take your monitor to your next doctor's appointment. Your doctor will: ? Make sure you are using it correctly. ? Make sure it is working right.  Make sure you understand what your blood pressure numbers should be.  Tell your doctor if your medicines are causing side effects. Contact a doctor if:  Your blood pressure keeps being high. Get help right away if:  Your first blood pressure number is higher than 180.  Your second blood pressure number is higher than 120. This information is not intended to replace advice given to you by your health care provider. Make sure you discuss any questions you have with your health care provider. Document Released: 07/14/2008 Document Revised: 07/14/2017 Document Reviewed: 01/08/2016 Elsevier Patient Education  2020 [employer].

## 2019-08-13 ENCOUNTER — Ambulatory Visit: Payer: Medicare Other | Admitting: Surgery

## 2019-08-15 ENCOUNTER — Encounter: Payer: Self-pay | Admitting: *Deleted

## 2019-09-06 ENCOUNTER — Ambulatory Visit: Payer: Medicare Other | Admitting: Surgery

## 2019-09-16 DIAGNOSIS — E785 Hyperlipidemia, unspecified: Secondary | ICD-10-CM | POA: Diagnosis not present

## 2019-09-16 DIAGNOSIS — R0602 Shortness of breath: Secondary | ICD-10-CM | POA: Diagnosis not present

## 2019-09-16 DIAGNOSIS — E039 Hypothyroidism, unspecified: Secondary | ICD-10-CM | POA: Diagnosis not present

## 2019-09-16 DIAGNOSIS — I10 Essential (primary) hypertension: Secondary | ICD-10-CM | POA: Diagnosis not present

## 2019-10-08 DIAGNOSIS — I4891 Unspecified atrial fibrillation: Secondary | ICD-10-CM | POA: Diagnosis not present

## 2019-10-08 DIAGNOSIS — E785 Hyperlipidemia, unspecified: Secondary | ICD-10-CM | POA: Diagnosis not present

## 2019-10-08 DIAGNOSIS — R0602 Shortness of breath: Secondary | ICD-10-CM | POA: Diagnosis not present

## 2019-10-08 DIAGNOSIS — E039 Hypothyroidism, unspecified: Secondary | ICD-10-CM | POA: Diagnosis not present

## 2019-10-15 ENCOUNTER — Ambulatory Visit
Admission: RE | Admit: 2019-10-15 | Discharge: 2019-10-15 | Disposition: A | Discharge status: Home / Self Care | Payer: Medicare PPO | Source: Ambulatory Visit | Attending: Surgery | Admitting: Surgery

## 2019-10-15 DIAGNOSIS — Z1231 Encounter for screening mammogram for malignant neoplasm of breast: Secondary | ICD-10-CM | POA: Diagnosis not present

## 2019-10-16 ENCOUNTER — Ambulatory Visit: Payer: Medicare PPO | Admitting: Internal Medicine

## 2019-10-18 ENCOUNTER — Ambulatory Visit: Payer: Medicare Other | Admitting: Internal Medicine

## 2019-10-22 ENCOUNTER — Other Ambulatory Visit: Payer: Self-pay

## 2019-10-22 ENCOUNTER — Ambulatory Visit (INDEPENDENT_AMBULATORY_CARE_PROVIDER_SITE_OTHER): Payer: Medicare PPO | Admitting: Family

## 2019-10-22 ENCOUNTER — Encounter: Payer: Self-pay | Admitting: Family

## 2019-10-22 VITALS — BP 158/62 | HR 64 | Ht 62.0 in | Wt 169.5 lb

## 2019-10-22 DIAGNOSIS — I4819 Other persistent atrial fibrillation: Secondary | ICD-10-CM

## 2019-10-22 DIAGNOSIS — I10 Essential (primary) hypertension: Secondary | ICD-10-CM

## 2019-10-22 DIAGNOSIS — I484 Atypical atrial flutter: Secondary | ICD-10-CM

## 2019-10-22 DIAGNOSIS — Z79899 Other long term (current) drug therapy: Secondary | ICD-10-CM

## 2019-10-22 DIAGNOSIS — Z7901 Long term (current) use of anticoagulants: Secondary | ICD-10-CM | POA: Diagnosis not present

## 2019-10-22 MED ORDER — FUROSEMIDE 20 MG PO TABS: 20.0000 mg | ORAL_TABLET | Freq: Every day | ORAL | 3 refills | Status: DC

## 2019-10-22 NOTE — Patient Instructions (Addendum)
Medication Instructions:  1- INCREASE Lasix Take 1 tablet (20 mg total) by mouth daily. *If you need a refill on your cardiac medications before your next appointment, please call your pharmacy*   Lab Work: Your physician recommends that you have lab work today(CBC, CMET, Tsh)  If you have labs (blood work) drawn today and your tests are completely normal, you will receive your results only by: Marland Kitchen MyChart Message (if you have MyChart) OR . A paper copy in the mail If you have any lab test that is abnormal or we need to change your treatment, we will call you to review the results.   Testing/Procedures: None ordered    Follow-Up: At [facility], you and your health needs are our priority.  As part of our continuing mission to provide you with exceptional heart care, we have created designated Provider Care Teams.  These Care Teams include your primary Cardiologist (physician) and Advanced Practice Providers (APPs -  Physician Assistants and Nurse Practitioners) who all work together to provide you with the care you need, when you need it.  We recommend signing up for the patient portal called "MyChart".  Sign up information is provided on this After Visit Summary.  MyChart is used to connect with patients for Virtual Visits (Telemedicine).  Patients are able to view lab/test results, encounter notes, upcoming appointments, etc.  Non-urgent messages can be sent to your provider as well.   To learn more about what you can do with MyChart, go to [email or web address].    Your next appointment: 3 months with MD (Dr. Okey Dupre) or app  1- Wear compression socks 2- Decrease salt 3- Elevate feet throughout the day when able

## 2019-10-22 NOTE — Progress Notes (Addendum)
Office Visit    Patient Name: Meagan Wilcox Date of Encounter: 10/23/2019  Primary Care Provider:  Corky Downs, MD Primary Cardiologist:  Yvonne Kendall, MD Electrophysiologist:  None   Chief Complaint    Meagan Wilcox is a 86 y.o. female with a hx of atrial fib/flutter, HTN, HLD, hypothyroidism, GERD, arthritis presents today for follow up of atrial fib/flutter.   Past Medical History    Past Medical History:  Diagnosis Date  . Abnormal mammogram, unspecified 2011  . Arthritis 2002  . GERD (gastroesophageal reflux disease)   . Heart murmur 1985  . Hyperlipidemia   . Hypertension 2005  . Hypothyroidism   . Persistent atrial fibrillation/Atypical atrial flutter (HCC)    a. 04/2019 TEE/DCCV; b. 05/2019 Recurrent Aflutter-->Amio-->DCCV; c. CHA2DS2VASc = 4-->Eliquis.   Past Surgical History:  Procedure Laterality Date  . APPENDECTOMY  1939  . BREAST BIOPSY Left 02-24-14   benign  . BREAST BIOPSY Left 02/11/2016   neg  . BREAST BIOPSY Left 2013   neg  . BREAST EXCISIONAL BIOPSY Left 2014   neg  . BREAST SURGERY Left 2013   stereotactic biopsy   . BREAST SURGERY Left May 2010   1 mm foci of atypical ductal hyperplasia  . CARDIOVERSION N/A 05/08/2019   Procedure: CARDIOVERSION;  Surgeon: Debbe Odea, MD;  Location: [facility];  Service: Cardiovascular;  Laterality: N/A;  . CARDIOVERSION N/A 05/31/2019   Procedure: CARDIOVERSION;  Surgeon: Antonieta Iba, MD;  Location: [facility];  Service: Cardiovascular;  Laterality: N/A;  . cataract surgery  Bilateral 2009  . COLONOSCOPY  2010   Dr. Mechele Collin  . left breast biopsy   2009  . TEE WITHOUT CARDIOVERSION N/A 05/08/2019   Procedure: TRANSESOPHAGEAL ECHOCARDIOGRAM (TEE);  Surgeon: Debbe Odea, MD;  Location: [facility];  Service: Cardiovascular;  Laterality: N/A;    Allergies  Allergies  Allergen Reactions  . Amoxicillin Diarrhea  . Penicillins Rash    Did it involve swelling of the face/tongue/throat, SOB,  or low BP? No Did it involve sudden or severe rash/hives, skin peeling, or any reaction on the inside of your mouth or nose? No Did you need to seek medical attention at a hospital or doctor's office? Yes When did it last happen?65 years ago If all above answers are "NO", may proceed with cephalosporin use.   History of Present Illness    Meagan Wilcox is a 86 y.o. female with a hx of atrial fib/flutter, HTN, HLD, hypothyroidism, GERD, arthritis. Last seen last seen 07/19/19 by Ward Givens, NP.  September 2020 diagnosed with atrial fib/atypical flutter and underwent TEE guided cardioversion. October 2020 noted tachypalpitations followed by fatigue and seen in ED. She was in atrial flutter with 2-1 AV block. On 05/29/19 placed on amiodarone prior to cardioversion which was performed 05/31/19. Post cardioversion noted mild nausea/fatigue and amiodarone dose reduced.  She is present today with her niece.  Reports four concerns today. First being nausea with car rides, second being continued elevated systolic blood pressures at home, third being lower extremity edema, and fourth being dyspnea on exertion.   Nausea noted only on long car rides.  She reports this is somewhat better when she drinks a soda during writing.  Systolic blood pressure readings taken at home with systolic readings typically 120s to 150s.  Diastolics typically 50s to 60s.  She questions whether she needs an additional antihypertensive agent.  We discussed increasing her amlodipine versus increasing her Lasix to daily.  Ultimately decided  to increase her Lasix to daily to see if that would help her lower extremity edema.  She has noted worsening lower extremity edema over the last approximately month.  Her primary care started her on Lasix 20 mg every other day for swelling.  We reviewed her recent echocardiogram 05/08/2019 which showed normal pumping function.  We discussed likely etiology of venous insufficiency.   Discussed compression stockings and elevating lower extremities.  Tells me she tries to avoid salt.  She reports her dyspnea on exertion is about her baseline.  Tells me she discussed more tired easily.  She tells me she thinks this may be related to age but she questions if there is something else.  We discussed her recent echocardiogram which showed no evidence of heart failure.  She reports no chest pain, pressure, tightness-low suspicion of ischemia. EKGs/Labs/Other Studies Reviewed:   The following studies were reviewed today:  EKG:  EKG is ordered today.  The ekg ordered today demonstrates SR 64 bpm with incomplete RBBB and no acute ST/T wave abnormality.   Recent Labs: 05/07/2019: Magnesium 1.9 10/22/2019: ALT 11; BUN 18; Creatinine, Ser 1.09; Hemoglobin 12.7; Platelets 274; Potassium 4.3; Sodium 135; TSH 3.670  Recent Lipid Panel No results found for: CHOL, TRIG, HDL, CHOLHDL, VLDL, LDLCALC, LDLDIRECT  Home Medications   Current Meds  Medication Sig  . acetaminophen (TYLENOL) 500 MG tablet Take 250 mg by mouth every 6 (six) hours as needed.  Marland Kitchen amiodarone (PACERONE) 200 MG tablet Take 1 tablet (200 mg) by mouth once daily  . amitriptyline (ELAVIL) 25 MG tablet Take 25 mg by mouth at bedtime.  Marland Kitchen amLODipine (NORVASC) 2.5 MG tablet Take 1 tablet (2.5 mg total) by mouth daily.  Marland Kitchen apixaban (ELIQUIS) 5 MG TABS tablet Take 1 tablet (5 mg total) by mouth 2 (two) times daily.  . diazepam (VALIUM) 5 MG tablet Take 5 mg by mouth daily as needed for anxiety.   . furosemide (LASIX) 20 MG tablet Take 1 tablet (20 mg total) by mouth daily.  Marland Kitchen levothyroxine (SYNTHROID, LEVOTHROID) 100 MCG tablet Take 100 mcg by mouth daily.  Marland Kitchen losartan-hydrochlorothiazide (HYZAAR) 100-12.5 MG tablet Take 1 tablet by mouth daily.  Marland Kitchen omeprazole (PRILOSEC) 20 MG capsule Take 20 mg by mouth daily.   . pentosan polysulfate (ELMIRON) 100 MG capsule Take 100 mg by mouth 2 (two) times daily.   . simvastatin (ZOCOR) 20 MG  tablet Take 20 mg by mouth every evening.  . [DISCONTINUED] furosemide (LASIX) 20 MG tablet Take 20 mg by mouth. Taking every other day    Review of Systems      Review of Systems  Constitution: Negative for chills, fever and malaise/fatigue.  Cardiovascular: Positive for dyspnea on exertion. Negative for chest pain, irregular heartbeat, leg swelling, near-syncope, orthopnea, palpitations and syncope.  Respiratory: Negative for cough, shortness of breath and wheezing.   Gastrointestinal: Positive for nausea (on car rides). Negative for melena and vomiting.  Genitourinary: Negative for hematuria.  Neurological: Negative for dizziness, light-headedness and weakness.   All other systems reviewed and are otherwise negative except as noted above.  Physical Exam    VS:  BP (!) 158/62 (BP Location: Left Arm, Patient Position: Sitting, Cuff Size: Normal)   Pulse 64   Ht 5\' 2"  (1.575 m)   Wt 169 lb 8 oz (76.9 kg)   SpO2 97%   BMI 31.00 kg/m  , BMI Body mass index is 31 kg/m. GEN: Well nourished, well developed, in no acute distress.  HEENT: normal. Neck: Supple, no JVD, carotid bruits, or masses. Cardiac: RRR, no murmurs, rubs, or gallops. No clubbing, cyanosis.  Radials/PT 2+ and equal bilaterally. LLE with 1+ pedal edema.  Respiratory:  Respirations regular and unlabored, clear to auscultation bilaterally. GI: Soft, nontender, nondistended, BS + x 4. MS: No deformity or atrophy. Skin: Warm and dry, no rash. Neuro:  Strength and sensation are intact. Psych: Normal affect.  Accessory Clinical Findings    ECG personally reviewed by me today - SR 64 bpm with incomplete RBBB - no acute changes.  Assessment & Plan    1. Persistent atrial fib/atypical atrial flutter - Initial DCCV 04/2019 with subsequent amiodarone loading and repeat cardioverison 05/2019. Beta blocker discontinued 05/2019 due to bradycardia. Maintaining NSR on EKG today. Continue Amiodarone 200mg  daily. Continue Eliquis  5mg  BID for CHADS2VASc of at least 4. She does not meet criteria for reduced dose ELiquis. Denies bleeding complications. CBC today for monitoring.   2. HTN - BP checked routinely at home with readings 120s-150s/50s-60s. BP above goal of less than 130/80. We will transition her Lasix to 20mg  daily from every other day. She will continue to monitor at home. Hesitant to increase Amlodipine due to LE edema.  3. On amiodarone therapy - No signs of toxicity. Continue Amiodarone 200mg  daily. CMET, TSH today for monitoring.   4. LE edema - Likely venous insufficiency. Noted varicose vein on physical exam. Echo 04/2019 with normal LVEF. Recommend compression stockings, low sodium diet. Increase Lasix, as above. If does not improve with increased lasix dose, could consider repeat echocardiogram for reassessment of LVEF.  5. DOE - Noted intermittent DOE with activity such as vacuuming. Longstanding problem. Likely etiology deconditioning which was discussed today. We will check TSH, CBC to rule out thyroid abnormality or anemia as cause.   6. HLD - Follows with PCP. Continue Simvastatin.   7. Hypothyroidism - Follows with PCP. TSH today for amiodarone monitoring.   8. Nausea -noted only on long car trips.  We discussed that this is not related to her heart and she is reassured.  Recommended ginger candy, peppermint.  Recommend she discuss with her primary care if it continues to be bothersome.  Disposition: Follow up in 3 month(s) with Dr. or APP.   , NP 10/23/2019, 1:31 PM

## 2019-10-23 ENCOUNTER — Telehealth: Payer: Self-pay | Admitting: *Deleted

## 2019-10-23 DIAGNOSIS — I10 Essential (primary) hypertension: Secondary | ICD-10-CM

## 2019-10-23 DIAGNOSIS — Z79899 Other long term (current) drug therapy: Secondary | ICD-10-CM

## 2019-10-23 LAB — COMPREHENSIVE METABOLIC PANEL
ALT: 11 IU/L (ref 0–32)
AST: 18 IU/L (ref 0–40)
Albumin/Globulin Ratio: 2 (ref 1.2–2.2)
Albumin: 4.4 g/dL (ref 3.6–4.6)
Alkaline Phosphatase: 68 IU/L (ref 39–117)
BUN/Creatinine Ratio: 17 (ref 12–28)
BUN: 18 mg/dL (ref 8–27)
Bilirubin Total: <0.2 mg/dL (ref 0.0–1.2)
CO2: 23 mmol/L (ref 20–29)
Calcium: 9.8 mg/dL (ref 8.7–10.3)
Chloride: 101 mmol/L (ref 96–106)
Creatinine, Ser: 1.09 mg/dL — ABNORMAL HIGH (ref 0.57–1.00)
GFR calc Af Amer: 53 mL/min/{1.73_m2} — ABNORMAL LOW (ref >59)
GFR calc non Af Amer: 46 mL/min/{1.73_m2} — ABNORMAL LOW (ref >59)
Globulin, Total: 2.2 g/dL (ref 1.5–4.5)
Glucose: 124 mg/dL — ABNORMAL HIGH (ref 65–99)
Potassium: 4.3 mmol/L (ref 3.5–5.2)
Sodium: 135 mmol/L (ref 134–144)
Total Protein: 6.6 g/dL (ref 6.0–8.5)

## 2019-10-23 LAB — CBC
Hematocrit: 38.1 % (ref 34.0–46.6)
Hemoglobin: 12.7 g/dL (ref 11.1–15.9)
MCH: 31.3 pg (ref 26.6–33.0)
MCHC: 33.3 g/dL (ref 31.5–35.7)
MCV: 94 fL (ref 79–97)
Platelets: 274 10*3/uL (ref 150–450)
RBC: 4.06 x10E6/uL (ref 3.77–5.28)
RDW: 13.4 % (ref 11.7–15.4)
WBC: 7.8 10*3/uL (ref 3.4–10.8)

## 2019-10-23 LAB — TSH: TSH: 3.67 u[IU]/mL (ref 0.450–4.500)

## 2019-10-23 NOTE — Telephone Encounter (Signed)
-----   Message from Alver Sorrow, NP sent at 10/23/2019  7:39 AM EST ----- Normal electrolytes and liver function. Thyroid function normal. Blood counts stable with no evidence of anemia or infection. Kidney function very mildly decreased compared to previous. Recommend she remain adequately hydrated. Please repeat BMET in 2 weeks for monitoring since we adjusted her medications at visit yesterday.

## 2019-10-23 NOTE — Telephone Encounter (Signed)
Results called to pt. Pt verbalized understanding. [facility] is aware [facility] repeat [facility]. Lab order entered.

## 2019-10-28 ENCOUNTER — Other Ambulatory Visit: Payer: Self-pay

## 2019-10-28 ENCOUNTER — Encounter: Payer: Self-pay | Admitting: Surgery

## 2019-10-28 ENCOUNTER — Ambulatory Visit: Payer: Medicare PPO | Admitting: Surgery

## 2019-10-28 VITALS — BP 158/71 | HR 65 | Temp 97.5°F | Resp 14 | Ht 62.0 in | Wt 167.0 lb

## 2019-10-28 DIAGNOSIS — Z1231 Encounter for screening mammogram for malignant neoplasm of breast: Secondary | ICD-10-CM | POA: Diagnosis not present

## 2019-10-28 NOTE — Patient Instructions (Addendum)
You may have your Mammograms ordered through your primary care provider for next year. You are due around 10/15/20.  Continue your self breast exams.  Follow-up with our office as needed.  Please call and ask to speak with a nurse if you develop questions or concerns.

## 2019-10-28 NOTE — Progress Notes (Signed)
10/28/2019  History of Present Illness: Meagan Wilcox is a 86 y.o. female presenting for follow up of prior left breast biopsies.  She reports that over the past year, she's had two cardioversions for atrial fibrillation and is on Eliquis.  Her last breast biopsy was in 2017.  She had a mammogram on 10/15/19, which was negative.  She denies any issues with either breast.  Past Medical History: Past Medical History:  Diagnosis Date  . Abnormal mammogram, unspecified 2011  . Arthritis 2002  . GERD (gastroesophageal reflux disease)   . Heart murmur 1985  . Hyperlipidemia   . Hypertension 2005  . Hypothyroidism   . Persistent atrial fibrillation/Atypical atrial flutter ([place])    a. 04/2019 TEE/DCCV; b. 05/2019 Recurrent Aflutter-->Amio-->DCCV; c. CHA2DS2VASc = 4-->Eliquis.     Past Surgical History: Past Surgical History:  Procedure Laterality Date  . APPENDECTOMY  1939  . BREAST BIOPSY Left 02-24-14   benign  . BREAST BIOPSY Left 02/11/2016   neg  . BREAST BIOPSY Left 2013   neg  . BREAST EXCISIONAL BIOPSY Left 2014   neg  . BREAST SURGERY Left 2013   stereotactic biopsy   . BREAST SURGERY Left May 2010   1 mm foci of atypical ductal hyperplasia  . CARDIOVERSION N/A 05/08/2019   Procedure: CARDIOVERSION;  Surgeon: Kate Sable, MD;  Location: [facility];  Service: Cardiovascular;  Laterality: N/A;  . CARDIOVERSION N/A 05/31/2019   Procedure: CARDIOVERSION;  Surgeon: Minna Merritts, MD;  Location: [facility];  Service: Cardiovascular;  Laterality: N/A;  . cataract surgery  Bilateral 2009  . COLONOSCOPY  2010   Dr. Vira Agar  . left breast biopsy   2009  . TEE WITHOUT CARDIOVERSION N/A 05/08/2019   Procedure: TRANSESOPHAGEAL ECHOCARDIOGRAM (TEE);  Surgeon: Kate Sable, MD;  Location: [facility];  Service: Cardiovascular;  Laterality: N/A;    Home Medications: Prior to Admission medications   Medication Sig Start Date End Date Taking? Authorizing Provider  acetaminophen  (TYLENOL) 500 MG tablet Take 250 mg by mouth every 6 (six) hours as needed.   Yes [provider]  amiodarone (PACERONE) 200 MG tablet Take 1 tablet (200 mg) by mouth once daily 06/07/19  Yes End, Harrell Gave, MD  amitriptyline (ELAVIL) 25 MG tablet Take 25 mg by mouth at bedtime.   Yes [provider]  apixaban (ELIQUIS) 5 MG TABS tablet Take 1 tablet (5 mg total) by mouth 2 (two) times daily. 06/07/19  Yes End, Harrell Gave, MD  diazepam (VALIUM) 5 MG tablet Take 5 mg by mouth daily as needed for anxiety.    Yes [provider]  furosemide (LASIX) 20 MG tablet Take 1 tablet (20 mg total) by mouth daily. 10/22/19  Yes Loel Dubonnet, NP  levothyroxine (SYNTHROID, LEVOTHROID) 100 MCG tablet Take 100 mcg by mouth daily.   Yes [provider]  losartan-hydrochlorothiazide (HYZAAR) 100-12.5 MG tablet Take 1 tablet by mouth daily.   Yes [provider]  omeprazole (PRILOSEC) 20 MG capsule Take 20 mg by mouth daily.    Yes [provider]  pentosan polysulfate (ELMIRON) 100 MG capsule Take 100 mg by mouth 2 (two) times daily.    Yes [provider]  simvastatin (ZOCOR) 20 MG tablet Take 20 mg by mouth every evening.   Yes [provider]  amLODipine (NORVASC) 2.5 MG tablet Take 1 tablet (2.5 mg total) by mouth daily. 07/19/19 10/22/19  Theora Gianotti, NP    Allergies: Allergies  Allergen Reactions  .  Amoxicillin Diarrhea  . Penicillins Rash    Did it involve swelling of the face/tongue/throat, SOB, or low BP? No Did it involve sudden or severe rash/hives, skin peeling, or any reaction on the inside of your mouth or nose? No Did you need to seek medical attention at a hospital or doctor's office? Yes When did it last happen?65 years ago If all above answers are "NO", may proceed with cephalosporin use.    Review of Systems: Review of Systems  Constitutional: Negative for chills and fever.  Respiratory:  Negative for shortness of breath.   Cardiovascular: Negative for chest pain.  Gastrointestinal: Negative for abdominal pain, nausea and vomiting.  Musculoskeletal: Positive for back pain and joint pain.    Physical Exam BP (!) 158/71   Pulse 65   Temp (!) 97.5 F (36.4 C)   Resp 14   Ht 5\' 2"  (1.575 m)   Wt 167 lb (75.8 kg)   SpO2 98%   BMI 30.54 kg/m  CONSTITUTIONAL: No acute distress HEENT:  Normocephalic, atraumatic, extraocular motion intact. RESPIRATORY:  Lungs are clear, and breath sounds are equal bilaterally. Normal respiratory effort without pathologic use of accessory muscles. CARDIOVASCULAR: Heart is regular without murmurs, gallops, or rubs. BREAST:  Left breast without any palpable masses, skin changes, or nipple changes.  No left axillary or supraclavicular lymphadenopathy.  Right breast without any palpable masses, skin changes, or nipple changes.  No left axillary or supraclavicular lymphadenopathy. NEUROLOGIC:  Motor and sensation is grossly normal.  Cranial nerves are grossly intact. PSYCH:  Alert and oriented to person, place and time. Affect is normal.  Labs/Imaging: Mammogram 10/15/19: FINDINGS: There are no findings suspicious for malignancy. Images were processed with CAD.  IMPRESSION: No mammographic evidence of malignancy. A result letter of this screening mammogram will be mailed directly to the patient.  RECOMMENDATION: Screening mammogram in one year. (Code:SM-B-01Y)  BI-RADS CATEGORY  1: Negative.  Assessment and Plan: This is a 86 y.o. female with multiple prior left breast biopsies.  --Discussed with the patient that her mammogram was negative for any suspicious findings.  Her exam today is also reassuring.  From the surgical standpoint, her recent mammograms have been negative, and her biopsies have not shown any malignancy.  I think it's appropriate for further mammograms and exams to deferred back her PCP.  She understands that if there  are any issues or concerns, we'd be happy to be of any help needed. --Follow up with 97 prn.  Face-to-face time spent with the patient and care providers was 15 minutes, with more than 50% of the time spent counseling, educating, and coordinating care of the patient.     [place], MD [facility]

## 2019-10-30 ENCOUNTER — Other Ambulatory Visit: Payer: Self-pay | Admitting: Cardiovascular Disease

## 2019-11-07 ENCOUNTER — Other Ambulatory Visit
Admission: RE | Admit: 2019-11-07 | Discharge: 2019-11-07 | Disposition: A | Discharge status: Home / Self Care | Payer: Medicare PPO | Source: Ambulatory Visit | Attending: Family | Admitting: Family

## 2019-11-07 DIAGNOSIS — I10 Essential (primary) hypertension: Secondary | ICD-10-CM | POA: Insufficient documentation

## 2019-11-07 DIAGNOSIS — Z79899 Other long term (current) drug therapy: Secondary | ICD-10-CM

## 2019-11-07 LAB — BASIC METABOLIC PANEL
Anion gap: 9 (ref 5–15)
BUN: 18 mg/dL (ref 8–23)
CO2: 25 mmol/L (ref 22–32)
Calcium: 9.4 mg/dL (ref 8.9–10.3)
Chloride: 103 mmol/L (ref 98–111)
Creatinine, Ser: 0.98 mg/dL (ref 0.44–1.00)
GFR calc Af Amer: >60 mL/min (ref >60)
GFR calc non Af Amer: 52 mL/min — ABNORMAL LOW (ref >60)
Glucose, Bld: 122 mg/dL — ABNORMAL HIGH (ref 70–99)
Potassium: 3.8 mmol/L (ref 3.5–5.1)
Sodium: 137 mmol/L (ref 135–145)

## 2019-12-23 ENCOUNTER — Encounter: Payer: Self-pay | Admitting: Internal Medicine

## 2019-12-23 ENCOUNTER — Ambulatory Visit: Payer: Medicare PPO | Admitting: Internal Medicine

## 2019-12-23 ENCOUNTER — Other Ambulatory Visit: Payer: Self-pay

## 2019-12-23 VITALS — BP 152/88 | HR 56 | Wt 168.3 lb

## 2019-12-23 DIAGNOSIS — I83893 Varicose veins of bilateral lower extremities with other complications: Secondary | ICD-10-CM

## 2019-12-23 DIAGNOSIS — I10 Essential (primary) hypertension: Secondary | ICD-10-CM

## 2019-12-23 DIAGNOSIS — I484 Atypical atrial flutter: Secondary | ICD-10-CM

## 2019-12-23 DIAGNOSIS — M5416 Radiculopathy, lumbar region: Secondary | ICD-10-CM

## 2019-12-23 DIAGNOSIS — G894 Chronic pain syndrome: Secondary | ICD-10-CM | POA: Diagnosis not present

## 2019-12-23 NOTE — Assessment & Plan Note (Signed)
Dash diet.  

## 2019-12-23 NOTE — Assessment & Plan Note (Signed)
resolved 

## 2019-12-23 NOTE — Assessment & Plan Note (Signed)
stable °

## 2019-12-23 NOTE — Progress Notes (Signed)
Established Patient Office Visit  Subjective:  Patient ID: Meagan Wilcox, female    DOB: 12/15/1930  Age: 86 y.o. MRN: [id number]  CC:  Chief Complaint  Patient presents with  . Edema    Both Ankles  . Hypertension    HPI  Meagan Wilcox presents for complaint of swelling of the leg she has been taking Lasix every day using support stocking she has a history of atrial fibrillation and has been shocked twice denies any chest pain.  Complains of shortness of breath on exertion she is also known to have a right bundle block.  Her medications were reconciled  Past Medical History:  Diagnosis Date  . Abnormal mammogram, unspecified 2011  . Arthritis 2002  . Chronic pain syndrome 08/01/2017  . GERD (gastroesophageal reflux disease)   . Heart murmur 1985  . Hyperlipidemia   . Hypertension 2005  . Hypothyroidism   . Persistent atrial fibrillation/Atypical atrial flutter (HCC)    a. 04/2019 TEE/DCCV; b. 05/2019 Recurrent Aflutter-->Amio-->DCCV; c. CHA2DS2VASc = 4-->Eliquis.    Past Surgical History:  Procedure Laterality Date  . APPENDECTOMY  1939  . BREAST BIOPSY Left 02-24-14   benign  . BREAST BIOPSY Left 02/11/2016   neg  . BREAST BIOPSY Left 2013   neg  . BREAST EXCISIONAL BIOPSY Left 2014   neg  . BREAST SURGERY Left 2013   stereotactic biopsy   . BREAST SURGERY Left May 2010   1 mm foci of atypical ductal hyperplasia  . CARDIOVERSION N/A 05/08/2019   Procedure: CARDIOVERSION;  Surgeon: Debbe Odea, MD;  Location: [facility];  Service: Cardiovascular;  Laterality: N/A;  . CARDIOVERSION N/A 05/31/2019   Procedure: CARDIOVERSION;  Surgeon: Antonieta Iba, MD;  Location: [facility];  Service: Cardiovascular;  Laterality: N/A;  . cataract surgery  Bilateral 2009  . COLONOSCOPY  2010   Dr. Mechele Collin  . left breast biopsy   2009  . TEE WITHOUT CARDIOVERSION N/A 05/08/2019   Procedure: TRANSESOPHAGEAL ECHOCARDIOGRAM (TEE);  Surgeon: Debbe Odea, MD;  Location:  [facility];  Service: Cardiovascular;  Laterality: N/A;    Family History  Problem Relation Age of Onset  . Heart disease Mother   . Heart disease Father   . Diabetes Father   . Stroke Father   . Arthritis Father     Social History   Socioeconomic History  . Marital status: Single    Spouse name: Not on file  . Number of children: Not on file  . Years of education: Not on file  . Highest education level: Not on file  Occupational History  . Not on file  Tobacco Use  . Smoking status: Never Smoker  . Smokeless tobacco: Never Used  Substance and Sexual Activity  . Alcohol use: No    Alcohol/week: 0.0 standard drinks  . Drug use: No  . Sexual activity: Not on file  Other Topics Concern  . Not on file  Social History Narrative  . Not on file   Social Determinants of Health   Financial Resource Strain:   . Difficulty of Paying Living Expenses:   Food Insecurity:   . Worried About Programme researcher, broadcasting/film/video in the Last Year:   . Barista in the Last Year:   Transportation Needs:   . Freight forwarder (Medical):   Marland Kitchen Lack of Transportation (Non-Medical):   Physical Activity:   . Days of Exercise per Week:   . Minutes of Exercise per Session:  Stress:   . Feeling of Stress :   Social Connections:   . Frequency of Communication with Friends and Family:   . Frequency of Social Gatherings with Friends and Family:   . Attends Religious Services:   . Active Member of Clubs or Organizations:   . Attends Archivist Meetings:   Marland Kitchen Marital Status:   Intimate Partner Violence:   . Fear of Current or Ex-Partner:   . Emotionally Abused:   Marland Kitchen Physically Abused:   . Sexually Abused:      Current Outpatient Medications:  .  meclizine (ANTIVERT) 25 MG tablet, Take 25 mg by mouth as needed for dizziness., Disp: , Rfl:  .  acetaminophen (TYLENOL) 500 MG tablet, Take 250 mg by mouth every 6 (six) hours as needed., Disp: , Rfl:  .  amiodarone (PACERONE) 200 MG  tablet, TAKE 1 TABLET(200 MG) BY MOUTH TWICE DAILY, Disp: 180 tablet, Rfl: 0 .  amitriptyline (ELAVIL) 25 MG tablet, Take 25 mg by mouth at bedtime., Disp: , Rfl:  .  amLODipine (NORVASC) 2.5 MG tablet, Take 1 tablet (2.5 mg total) by mouth daily., Disp: 180 tablet, Rfl: 3 .  apixaban (ELIQUIS) 5 MG TABS tablet, Take 1 tablet (5 mg total) by mouth 2 (two) times daily., Disp: 180 tablet, Rfl: 3 .  furosemide (LASIX) 20 MG tablet, Take 1 tablet (20 mg total) by mouth daily., Disp: 90 tablet, Rfl: 3 .  levothyroxine (SYNTHROID, LEVOTHROID) 100 MCG tablet, Take 100 mcg by mouth daily., Disp: , Rfl:  .  losartan-hydrochlorothiazide (HYZAAR) 100-12.5 MG tablet, Take 1 tablet by mouth daily., Disp: , Rfl:  .  omeprazole (PRILOSEC) 20 MG capsule, Take 20 mg by mouth daily. , Disp: , Rfl:  .  pentosan polysulfate (ELMIRON) 100 MG capsule, Take 100 mg by mouth 2 (two) times daily. , Disp: , Rfl:  .  simvastatin (ZOCOR) 20 MG tablet, Take 20 mg by mouth every evening., Disp: , Rfl:    Allergies  Allergen Reactions  . Amoxicillin Diarrhea  . Penicillins Rash    Did it involve swelling of the face/tongue/throat, SOB, or low BP? No Did it involve sudden or severe rash/hives, skin peeling, or any reaction on the inside of your mouth or nose? No Did you need to seek medical attention at a hospital or doctor's office? Yes When did it last happen?65 years ago If all above answers are "NO", may proceed with cephalosporin use.    ROS Review of Systems  Constitutional: Positive for fatigue.  HENT: Negative for congestion.   Eyes: Negative for pain.  Respiratory: Positive for chest tightness (lt side of chest) and shortness of breath. Negative for cough.   Genitourinary: Positive for hematuria. Negative for flank pain.  Neurological: Negative for dizziness and light-headedness.  Psychiatric/Behavioral: Negative for behavioral problems.      Objective:    Physical Exam  HENT:  Head:  Normocephalic and atraumatic.  Eyes: Pupils are equal, round, and reactive to light. Conjunctivae and EOM are normal. No scleral icterus.  Neck: No JVD present. No tracheal deviation present. No thyromegaly present.  Cardiovascular: Normal heart sounds.  Abdominal: Bowel sounds are normal.  Musculoskeletal:        General: No tenderness. Edema: 2. Normal range of motion.     Cervical back: Neck supple.     Comments: @+ edema   Lymphadenopathy:    She has no cervical adenopathy.  Neurological: No cranial nerve deficit. Coordination normal.    BP Marland Kitchen)  152/88 (BP Location: Right Arm, Patient Position: Sitting, Cuff Size: Normal)   Pulse (!) 56   Wt 168 lb 4.8 oz (76.3 kg)   BMI 30.78 kg/m  Wt Readings from Last 3 Encounters:  12/23/19 168 lb 4.8 oz (76.3 kg)  10/28/19 167 lb (75.8 kg)  10/22/19 169 lb 8 oz (76.9 kg)     Health Maintenance Due  Topic Date Due  . COVID-19 Vaccine (1) Never done  . TETANUS/TDAP  Never done  . PNA vac Low Risk Adult (1 of 2 - PCV13) Never done    There are no preventive care reminders to display for this patient.  Lab Results  Component Value Date   TSH 3.670 10/22/2019   Lab Results  Component Value Date   WBC 7.8 10/22/2019   HGB 12.7 10/22/2019   HCT 38.1 10/22/2019   MCV 94 10/22/2019   PLT 274 10/22/2019   Lab Results  Component Value Date   NA 137 11/07/2019   K 3.8 11/07/2019   CO2 25 11/07/2019   GLUCOSE 122 (H) 11/07/2019   BUN 18 11/07/2019   CREATININE 0.98 11/07/2019   BILITOT <0.2 10/22/2019   ALKPHOS 68 10/22/2019   AST 18 10/22/2019   ALT 11 10/22/2019   PROT 6.6 10/22/2019   ALBUMIN 4.4 10/22/2019   CALCIUM 9.4 11/07/2019   ANIONGAP 9 11/07/2019   No results found for: CHOL No results found for: HDL No results found for: LDLCALC No results found for: TRIG No results found for: CHOLHDL No results found for: ZOXW9U    Assessment & Plan:   Problem List Items Addressed This Visit      Cardiovascular and  Mediastinum   Essential hypertension    Dash diet      Atrial flutter (HCC) - Primary    resolved      Varicose veins of leg with edema, bilateral     Nervous and Auditory   Lumbar radiculopathy    stable        Other   RESOLVED: Chronic pain syndrome      No orders of the defined types were placed in this encounter.   Follow-up: Return in about 3 months (around 03/24/2020).    Corky Downs, MD

## 2020-01-03 ENCOUNTER — Other Ambulatory Visit: Payer: Self-pay | Admitting: Internal Medicine

## 2020-01-20 DIAGNOSIS — Z961 Presence of intraocular lens: Secondary | ICD-10-CM | POA: Diagnosis not present

## 2020-01-29 ENCOUNTER — Ambulatory Visit: Payer: Medicare PPO | Admitting: Internal Medicine

## 2020-01-29 ENCOUNTER — Other Ambulatory Visit: Payer: Self-pay

## 2020-01-29 ENCOUNTER — Encounter: Payer: Self-pay | Admitting: Internal Medicine

## 2020-01-29 VITALS — BP 140/60 | HR 54 | Ht 64.0 in | Wt 168.2 lb

## 2020-01-29 DIAGNOSIS — IMO0002 Reserved for concepts with insufficient information to code with codable children: Secondary | ICD-10-CM

## 2020-01-29 DIAGNOSIS — R0600 Dyspnea, unspecified: Secondary | ICD-10-CM

## 2020-01-29 DIAGNOSIS — I4891 Unspecified atrial fibrillation: Secondary | ICD-10-CM

## 2020-01-29 DIAGNOSIS — I4892 Unspecified atrial flutter: Secondary | ICD-10-CM | POA: Diagnosis not present

## 2020-01-29 DIAGNOSIS — R0609 Other forms of dyspnea: Secondary | ICD-10-CM

## 2020-01-29 DIAGNOSIS — I10 Essential (primary) hypertension: Secondary | ICD-10-CM

## 2020-01-29 DIAGNOSIS — R5383 Other fatigue: Secondary | ICD-10-CM | POA: Diagnosis not present

## 2020-01-29 DIAGNOSIS — R110 Nausea: Secondary | ICD-10-CM | POA: Diagnosis not present

## 2020-01-29 MED ORDER — AMIODARONE HCL 100 MG PO TABS: 100.0000 mg | ORAL_TABLET | Freq: Every day | ORAL | 1 refills | Status: DC

## 2020-01-29 NOTE — Patient Instructions (Signed)
Medication Instructions:  Your physician has recommended you make the following change in your medication:  Stop Amlodipine.  Decrease Amiodarone to 100 mg by mouth daily.  *If you need a refill on your cardiac medications before your next appointment, please call your pharmacy*   Lab Work: None If you have labs (blood work) drawn today and your tests are completely normal, you will receive your results only by: Marland Kitchen MyChart Message (if you have MyChart) OR . A paper copy in the mail If you have any lab test that is abnormal or we need to change your treatment, we will call you to review the results.   Testing/Procedures: NONE   Follow-Up: At [facility], you and your health needs are our priority.  As part of our continuing mission to provide you with exceptional heart care, we have created designated Provider Care Teams.  These Care Teams include your primary Cardiologist (physician) and Advanced Practice Providers (APPs -  Physician Assistants and Nurse Practitioners) who all work together to provide you with the care you need, when you need it.  We recommend signing up for the patient portal called "MyChart".  Sign up information is provided on this After Visit Summary.  MyChart is used to connect with patients for Virtual Visits (Telemedicine).  Patients are able to view lab/test results, encounter notes, upcoming appointments, etc.  Non-urgent messages can be sent to your provider as well.   To learn more about what you can do with MyChart, go to [email or web address].    Your next appointment:   1 month(s)  The format for your next appointment:   In Person  Provider:    You may see or one of the following Advanced Practice Providers on your designated Care Team:    Nicolasa Ducking, NP  Eula Listen, PA-C  Marisue Ivan, PA-C

## 2020-01-29 NOTE — Progress Notes (Signed)
Follow-up Outpatient Visit Date: 01/29/2020  Primary Care Provider: Corky Downs, MD [address] [place] [address]  Chief Complaint: Nausea and leg swelling  HPI:  Ms. Kneece is a 86 y.o. female with history of atrial fibrillation/flutter, hypertension, hyperlipidemia, hypothyroidism, GERD, and arthritis, who presents for follow-up of atrial fibrillation/flutter.  She was last seen in our office in March by Gillian Shields, NP, at which time she complained of nausea and on long car rides as well as mild hypertension.  She also noted worsening lower extremity edema for which her PCP had added furosemide 20 mg every other day.  Chronic exertional dyspnea was stable.  Furosemide was increased to 20 mg daily.  Labs were relatively unremarkable.  Today, Ms. Jenning has several concerns, similar to her last visit.  She continues to have episodic nausea, most pronounced when she is riding in the car, though sometimes even when she is at home.  She also has waxing and waning leg edema, worse on the left.  She also feels more fatigued than in the past and gets out of breath more easily when she is active.  This is most noticeable when she has been working outside in her yard.  Ms. Gergen is compliant with her medications, including amiodarone and apixaban.  She has not had any significant bleeding.  She denies chest pain, palpitations, and lightheadedness.  Home blood pressure readings are typically in the 130s to 140s systolic, though occasional 150s are recorded.  --------------------------------------------------------------------------------------------------  Past Medical History:  Diagnosis Date   Abnormal mammogram, unspecified 2011   Arthritis 2002   Chronic pain syndrome 08/01/2017   GERD (gastroesophageal reflux disease)    Heart murmur 1985   Hyperlipidemia    Hypertension 2005   Hypothyroidism    Persistent atrial fibrillation/Atypical atrial flutter (HCC)    a. 04/2019  TEE/DCCV; b. 05/2019 Recurrent Aflutter-->Amio-->DCCV; c. CHA2DS2VASc = 4-->Eliquis.   Past Surgical History:  Procedure Laterality Date   APPENDECTOMY  1939   BREAST BIOPSY Left 02-24-14   benign   BREAST BIOPSY Left 02/11/2016   neg   BREAST BIOPSY Left 2013   neg   BREAST EXCISIONAL BIOPSY Left 2014   neg   BREAST SURGERY Left 2013   stereotactic biopsy    BREAST SURGERY Left May 2010   1 mm foci of atypical ductal hyperplasia   CARDIOVERSION N/A 05/08/2019   Procedure: CARDIOVERSION;  Surgeon: Debbe Odea, MD;  Location: [facility];  Service: Cardiovascular;  Laterality: N/A;   CARDIOVERSION N/A 05/31/2019   Procedure: CARDIOVERSION;  Surgeon: Antonieta Iba, MD;  Location: [facility];  Service: Cardiovascular;  Laterality: N/A;   cataract surgery  Bilateral 2009   COLONOSCOPY  2010   Dr. Mechele Collin   left breast biopsy   2009   TEE WITHOUT CARDIOVERSION N/A 05/08/2019   Procedure: TRANSESOPHAGEAL ECHOCARDIOGRAM (TEE);  Surgeon: Debbe Odea, MD;  Location: [facility];  Service: Cardiovascular;  Laterality: N/A;     Recent CV Pertinent Labs: Lab Results  Component Value Date   INR 1.0 05/07/2019   K 3.8 11/07/2019   MG 1.9 05/07/2019   BUN 18 11/07/2019   BUN 18 10/22/2019   CREATININE 0.98 11/07/2019    Past medical and surgical history were reviewed and updated in EPIC.  Current Meds  Medication Sig   acetaminophen (TYLENOL) 500 MG tablet Take 250 mg by mouth every 6 (six) hours as needed.   amiodarone (PACERONE) 100 MG tablet Take 1 tablet (100 mg total)  by mouth daily.   amitriptyline (ELAVIL) 25 MG tablet TAKE 1 TABLET BY MOUTH DAILY   apixaban (ELIQUIS) 5 MG TABS tablet Take 1 tablet (5 mg total) by mouth 2 (two) times daily.   furosemide (LASIX) 20 MG tablet Take 1 tablet (20 mg total) by mouth daily.   levothyroxine (SYNTHROID, LEVOTHROID) 100 MCG tablet Take 100 mcg by mouth daily.   losartan-hydrochlorothiazide (HYZAAR) 100-12.5  MG tablet Take 1 tablet by mouth daily.   meclizine (ANTIVERT) 25 MG tablet Take 25 mg by mouth as needed for dizziness.   omeprazole (PRILOSEC) 20 MG capsule Take 20 mg by mouth daily.    pentosan polysulfate (ELMIRON) 100 MG capsule Take 100 mg by mouth 2 (two) times daily.    simvastatin (ZOCOR) 20 MG tablet Take 20 mg by mouth every evening.   [DISCONTINUED] amiodarone (PACERONE) 200 MG tablet TAKE 1 TABLET(200 MG) BY MOUTH TWICE DAILY   [DISCONTINUED] amLODipine (NORVASC) 2.5 MG tablet Take 1 tablet (2.5 mg total) by mouth daily.    Allergies: Amoxicillin and Penicillins  Social History   Tobacco Use   Smoking status: Never Smoker   Smokeless tobacco: Never Used  Vaping Use   Vaping Use: Never used  Substance Use Topics   Alcohol use: No    Alcohol/week: 0.0 standard drinks   Drug use: No    Family History  Problem Relation Age of Onset   Heart disease Mother    Heart disease Father    Diabetes Father    Stroke Father    Arthritis Father     Review of Systems: A 12-system review of systems was performed and was negative except as noted in the HPI.  --------------------------------------------------------------------------------------------------  Physical Exam: BP 140/60 (BP Location: Right Arm, Patient Position: Sitting, Cuff Size: Normal)    Pulse (!) 54    Ht 5\' 4"  (1.626 m)    Wt 168 lb 4 oz (76.3 kg)    SpO2 98%    BMI 28.88 kg/m   General: NAD.  Accompanied by her niece. HEENT: No conjunctival pallor or scleral icterus. Facemask in place. Neck: No JVD or HJR. Lungs: Normal work of breathing. Clear to auscultation bilaterally without wheezes or crackles. Heart: Bradycardic but regular without murmurs, rubs, or gallops. Abd: Bowel sounds present. Soft, NT/ND. Ext: No lower extremity edema.  EKG: Sinus bradycardia with LVH and abnormal repolarization.  No significant change from prior tracing on 10/22/2019.  Lab Results  Component Value Date    WBC 7.8 10/22/2019   HGB 12.7 10/22/2019   HCT 38.1 10/22/2019   MCV 94 10/22/2019   PLT 274 10/22/2019    Lab Results  Component Value Date   NA 137 11/07/2019   K 3.8 11/07/2019   CL 103 11/07/2019   CO2 25 11/07/2019   BUN 18 11/07/2019   CREATININE 0.98 11/07/2019   GLUCOSE 122 (H) 11/07/2019   ALT 11 10/22/2019    Lab Results  Component Value Date   TSH 3.670 10/22/2019   --------------------------------------------------------------------------------------------------  ASSESSMENT AND PLAN: Atrial fibrillation/flutter: Ms. Bevans continues to maintain sinus rhythm.  Given several complaints including nausea and fatigue, I have recommended that we decrease amiodarone to 100 mg daily.  CBC, CMP, and TSH at her visit in March were all normal.  We will continue long-term anticoagulation with apixaban 5 mg twice daily.  Lower extremity edema: Suspect this is due to a combination of venous insufficiency and medication side effects from amlodipine.  I have encouraged Ms.  Kaluzny to minimize her sodium intake and to elevate her legs when possible.  We will discontinue amlodipine to see if that helps improve her symptoms.  If she continues to have lower extremity edema, we will need to repeat a transthoracic echocardiogram and obtain lower extremity venous duplex.  Overall, however, my suspicion for DVT is low given chronic anticoagulation with apixaban.  Fatigue, nausea, and dyspnea on exertion: Symptoms are likely multifactorial, though I am concerned for some medication side effects.  As above, we will decreasing amiodarone.  If symptoms do not improve, we will need to repeat echocardiogram and and consider obtaining a chest radiograph and PFTs in the setting of long-term amiodarone use.  Hypertension: Blood pressure mildly elevated today and typically upper normal to mildly elevated at home.  Given edema and symptoms reported today, we have agreed to discontinue amlodipine and  tolerate a small degree of permissive hypertension.  Follow-up: Return to clinic in 1 month.  Yvonne Kendall, MD 01/30/2020 7:08 AM

## 2020-01-30 ENCOUNTER — Other Ambulatory Visit: Payer: Self-pay

## 2020-01-30 ENCOUNTER — Encounter: Payer: Self-pay | Admitting: Internal Medicine

## 2020-01-30 DIAGNOSIS — R0609 Other forms of dyspnea: Secondary | ICD-10-CM | POA: Insufficient documentation

## 2020-01-30 DIAGNOSIS — R5383 Other fatigue: Secondary | ICD-10-CM | POA: Insufficient documentation

## 2020-01-30 DIAGNOSIS — R110 Nausea: Secondary | ICD-10-CM | POA: Insufficient documentation

## 2020-01-30 MED ORDER — AMIODARONE HCL 100 MG PO TABS: 100.0000 mg | ORAL_TABLET | Freq: Every day | ORAL | 3 refills | Status: DC

## 2020-01-31 ENCOUNTER — Other Ambulatory Visit: Payer: Self-pay | Admitting: *Deleted

## 2020-01-31 MED ORDER — LOSARTAN POTASSIUM-HCTZ 100-12.5 MG PO TABS: 1.0000 | ORAL_TABLET | Freq: Every day | ORAL | 3 refills | Status: DC

## 2020-02-28 ENCOUNTER — Encounter: Payer: Self-pay | Admitting: Nurse Practitioner

## 2020-02-28 ENCOUNTER — Ambulatory Visit (INDEPENDENT_AMBULATORY_CARE_PROVIDER_SITE_OTHER): Payer: Medicare PPO | Admitting: Nurse Practitioner

## 2020-02-28 ENCOUNTER — Other Ambulatory Visit: Payer: Self-pay

## 2020-02-28 VITALS — BP 158/70 | HR 54 | Ht 64.0 in | Wt 166.5 lb

## 2020-02-28 DIAGNOSIS — I10 Essential (primary) hypertension: Secondary | ICD-10-CM | POA: Diagnosis not present

## 2020-02-28 DIAGNOSIS — I480 Paroxysmal atrial fibrillation: Secondary | ICD-10-CM | POA: Diagnosis not present

## 2020-02-28 DIAGNOSIS — R0609 Other forms of dyspnea: Secondary | ICD-10-CM

## 2020-02-28 DIAGNOSIS — R600 Localized edema: Secondary | ICD-10-CM

## 2020-02-28 DIAGNOSIS — R0600 Dyspnea, unspecified: Secondary | ICD-10-CM

## 2020-02-28 NOTE — Progress Notes (Signed)
Office Visit    Patient Name: Meagan Wilcox Date of Encounter: 02/28/2020  Primary Care Provider:  Corky Downs, MD Primary Cardiologist:  Yvonne Kendall, MD  Chief Complaint    86 y/o ? with a history of atrial fibrillation/flutter, hypertension, hyperlipidemia, hypothyroidism, GERD, and arthritis, who presents for follow-up related to nausea and edema.  Past Medical History    Past Medical History:  Diagnosis Date  . Abnormal mammogram, unspecified 2011  . Arthritis 2002  . Chronic pain syndrome 08/01/2017  . GERD (gastroesophageal reflux disease)   . Heart murmur 1985  . Hyperlipidemia   . Hypertension 2005  . Hypothyroidism   . Persistent atrial fibrillation/Atypical atrial flutter (HCC)    a. 04/2019 TEE/DCCV; b. 05/2019 Recurrent Aflutter-->Amio-->DCCV; c. CHA2DS2VASc = 4-->Eliquis.   Past Surgical History:  Procedure Laterality Date  . APPENDECTOMY  1939  . BREAST BIOPSY Left 02-24-14   benign  . BREAST BIOPSY Left 02/11/2016   neg  . BREAST BIOPSY Left 2013   neg  . BREAST EXCISIONAL BIOPSY Left 2014   neg  . BREAST SURGERY Left 2013   stereotactic biopsy   . BREAST SURGERY Left May 2010   1 mm foci of atypical ductal hyperplasia  . CARDIOVERSION N/A 05/08/2019   Procedure: CARDIOVERSION;  Surgeon: Debbe Odea, MD;  Location: [facility];  Service: Cardiovascular;  Laterality: N/A;  . CARDIOVERSION N/A 05/31/2019   Procedure: CARDIOVERSION;  Surgeon: Antonieta Iba, MD;  Location: [facility];  Service: Cardiovascular;  Laterality: N/A;  . cataract surgery  Bilateral 2009  . COLONOSCOPY  2010   Dr. Mechele Collin  . left breast biopsy   2009  . TEE WITHOUT CARDIOVERSION N/A 05/08/2019   Procedure: TRANSESOPHAGEAL ECHOCARDIOGRAM (TEE);  Surgeon: Debbe Odea, MD;  Location: [facility];  Service: Cardiovascular;  Laterality: N/A;    Allergies  Allergies  Allergen Reactions  . Amoxicillin Diarrhea  . Penicillins Rash    Did it involve swelling of  the face/tongue/throat, SOB, or low BP? No Did it involve sudden or severe rash/hives, skin peeling, or any reaction on the inside of your mouth or nose? No Did you need to seek medical attention at a hospital or doctor's office? Yes When did it last happen?65 years ago If all above answers are "NO", may proceed with cephalosporin use.    History of Present Illness    86 year old female with the above past medical history including atrial fibrillation/flutter, hypertension, hyperlipidemia, hypothyroidism, GERD, and arthritis.  In September 2020, she was diagnosed with atrial fibrillation/atypical atrial flutter underwent TEE guided cardioversion.  She had recurrent tachypalpitations October 2020 and was found to be in atrial flutter with 2-1 block.  She was placed on amiodarone and underwent repeat cardioversion in mid October 2020.  Amiodarone dose was reduced to 2 mg daily in November 2020 in the setting of nausea and fatigue.  She did have improved energy initially but at recent follow-up with Dr. Okey Dupre on June 16, she reported more frequent nausea, which was most pronounced while riding in a car.  She also reported waxing and waning lower extremity swelling, worse on the left, despite being placed on furosemide therapy.  At that visit, amlodipine was discontinued and amiodarone was reduced to 100 mg daily.  Since then, she has felt much better.  She has more or less had resolution of nausea and also of lower extremity swelling.  She does wear support stockings as well.  She has not been having any palpitations  despite the lower dose of amiodarone and is in sinus rhythm today.  She denies chest pain, dyspnea, PND, orthopnea, dizziness, syncope, or early satiety.  She does check her blood pressure regularly at home and has numbers with her today.  She is trending in the 130s for the most part with occasional 140s and rare 150s.  Home Medications    Prior to Admission medications   Medication Sig  Start Date End Date Taking? Authorizing Provider  acetaminophen (TYLENOL) 500 MG tablet Take 250 mg by mouth every 6 (six) hours as needed.   Yes [provider]  amiodarone (PACERONE) 100 MG tablet Take 1 tablet (100 mg total) by mouth daily. 01/30/20  Yes End, Cristal Deer, MD  amitriptyline (ELAVIL) 25 MG tablet TAKE 1 TABLET BY MOUTH DAILY 01/06/20  Yes Masoud, Renda Rolls, MD  apixaban (ELIQUIS) 5 MG TABS tablet Take 1 tablet (5 mg total) by mouth 2 (two) times daily. 06/07/19  Yes End, Cristal Deer, MD  BIOTIN PO Take by mouth daily.   Yes [provider]  furosemide (LASIX) 20 MG tablet Take 1 tablet (20 mg total) by mouth daily. 10/22/19  Yes Alver Sorrow, NP  levothyroxine (SYNTHROID, LEVOTHROID) 100 MCG tablet Take 100 mcg by mouth daily.   Yes [provider]  losartan-hydrochlorothiazide (HYZAAR) 100-12.5 MG tablet Take 1 tablet by mouth daily. 01/31/20  Yes Masoud, Renda Rolls, MD  meclizine (ANTIVERT) 25 MG tablet Take 25 mg by mouth as needed for dizziness.   Yes [provider]  omeprazole (PRILOSEC) 20 MG capsule Take 20 mg by mouth daily.    Yes [provider]  pentosan polysulfate (ELMIRON) 100 MG capsule Take 100 mg by mouth 2 (two) times daily.    Yes [provider]  simvastatin (ZOCOR) 20 MG tablet Take 20 mg by mouth every evening.   Yes [provider]    Review of Systems    Feeling much better since reducing amiodarone and stopping amlodipine.  She denies chest pain, dyspnea, palpitations, PND, orthopnea, dizziness, syncope, or early satiety.  She does note occasional mild lower extremity swelling though this is much improved.  All other systems reviewed and are otherwise negative except as noted above.  Physical Exam    VS:  BP (!) 158/70 (BP Location: Left Arm, Patient Position: Sitting, Cuff Size: Normal)   Pulse (!) 54   Ht 5\' 4"  (1.626 m)   Wt 166 lb 8 oz (75.5 kg)   SpO2 98%   BMI 28.58 kg/m  , BMI Body mass  index is 28.58 kg/m. GEN: Well nourished, well developed, in no acute distress. HEENT: normal. Neck: Supple, no JVD, carotid bruits, or masses. Cardiac: RRR, no murmurs, rubs, or gallops. No clubbing, cyanosis, edema.  Radials/PT 2+ and equal bilaterally.  Respiratory:  Respirations regular and unlabored, clear to auscultation bilaterally. GI: Soft, nontender, nondistended, BS + x 4. MS: no deformity or atrophy. Skin: warm and dry, no rash. Neuro:  Strength and sensation are intact. Psych: Normal affect.  Accessory Clinical Findings    ECG personally reviewed by me today -sinus bradycardia, 54, left axis deviation, LVH, normal QTC- no acute changes.  Lab Results  Component Value Date   WBC 7.8 10/22/2019   HGB 12.7 10/22/2019   HCT 38.1 10/22/2019   MCV 94 10/22/2019   PLT 274 10/22/2019   Lab Results  Component Value Date   CREATININE 0.98 11/07/2019   BUN 18 11/07/2019   NA 137 11/07/2019   K  3.8 11/07/2019   CL 103 11/07/2019   CO2 25 11/07/2019   Lab Results  Component Value Date   ALT 11 10/22/2019   AST 18 10/22/2019   ALKPHOS 68 10/22/2019   BILITOT <0.2 10/22/2019    Assessment & Plan    1.  Paroxysmal atrial fibrillation/flutter: She is maintaining sinus rhythm on low-dose amiodarone-100 mg.  Dose was reduced in June secondary to nausea and nausea has since resolved.  She remains anticoagulated with Eliquis.  2.  Essential hypertension: Blood pressure elevated today at 158/70 however, she has mostly been trending in the 130s at home with occasional 140s to 150s.  Amlodipine was discontinued previously secondary to lower extremity swelling, which has since resolved.  We discussed options for management today in the setting of pressures that are typically in the 130s at home, I will not add any additional medications at this time.  Patient will continue to follow-up blood pressures at home.  3.  Lower extremity swelling: Much improved after stopping amlodipine.   She continues to wear teds and does not have any edema today.  4.  Dyspnea on exertion: Patient denies any dyspnea today and feels that amiodarone was just making her feel poorly which resulted in multiple symptoms.  She is euvolemic on examination.  5.  Disposition: Follow-up in clinic in 3 to 4 months or sooner if necessary.   Nicolasa Ducking, NP 02/28/2020, 11:35 AM

## 2020-02-28 NOTE — Patient Instructions (Signed)
Medication Instructions: Your physician recommends that you continue on your current medications as directed. Please refer to the Current Medication list given to you today.  *If you need a refill on your cardiac medications before your next appointment, please call your pharmacy*   Lab Work:none ordered If you have labs (blood work) drawn today and your tests are completely normal, you will receive your results only by: Marland Kitchen MyChart Message (if you have MyChart) OR . A paper copy in the mail If you have any lab test that is abnormal or we need to change your treatment, we will call you to review the results.   Testing/Procedures:None ordered   Follow-Up: At [facility], you and your health needs are our priority.  As part of our continuing mission to provide you with exceptional heart care, we have created designated Provider Care Teams.  These Care Teams include your primary Cardiologist (physician) and Advanced Practice Providers (APPs -  Physician Assistants and Nurse Practitioners) who all work together to provide you with the care you need, when you need it.  We recommend signing up for the patient portal called "MyChart".  Sign up information is provided on this After Visit Summary.  MyChart is used to connect with patients for Virtual Visits (Telemedicine).  Patients are able to view lab/test results, encounter notes, upcoming appointments, etc.  Non-urgent messages can be sent to your provider as well.   To learn more about what you can do with MyChart, go to [email or web address].    Your next appointment:   4 month(s)  The format for your next appointment:   In Person  Provider:    You may see Yvonne Kendall, MD or Nicolasa Ducking, NP

## 2020-03-07 ENCOUNTER — Other Ambulatory Visit: Payer: Self-pay | Admitting: Internal Medicine

## 2020-03-17 ENCOUNTER — Other Ambulatory Visit: Payer: Self-pay | Admitting: Internal Medicine

## 2020-03-24 ENCOUNTER — Other Ambulatory Visit: Payer: Self-pay

## 2020-03-24 ENCOUNTER — Ambulatory Visit: Payer: Medicare PPO | Admitting: Internal Medicine

## 2020-03-24 ENCOUNTER — Encounter: Payer: Self-pay | Admitting: Internal Medicine

## 2020-03-24 VITALS — BP 157/60 | HR 69 | Ht 64.0 in | Wt 165.4 lb

## 2020-03-24 DIAGNOSIS — F419 Anxiety disorder, unspecified: Secondary | ICD-10-CM

## 2020-03-24 DIAGNOSIS — I483 Typical atrial flutter: Secondary | ICD-10-CM

## 2020-03-24 DIAGNOSIS — I10 Essential (primary) hypertension: Secondary | ICD-10-CM

## 2020-03-24 DIAGNOSIS — M5136 Other intervertebral disc degeneration, lumbar region: Secondary | ICD-10-CM

## 2020-03-24 DIAGNOSIS — F329 Major depressive disorder, single episode, unspecified: Secondary | ICD-10-CM | POA: Diagnosis not present

## 2020-03-24 DIAGNOSIS — R5383 Other fatigue: Secondary | ICD-10-CM

## 2020-03-24 DIAGNOSIS — F32A Depression, unspecified: Secondary | ICD-10-CM

## 2020-03-24 MED ORDER — DIAZEPAM 5 MG PO TABS: 5.0000 mg | ORAL_TABLET | Freq: Two times a day (BID) | ORAL | 1 refills | Status: AC | PRN

## 2020-03-24 NOTE — Assessment & Plan Note (Signed)
We will give her a prescription for Valium 5 mg p.o. at night as needed - Patient experiencing high levels of anxiety.  - Encouraged patient to engage in relaxing activities like yoga, meditation, journaling, going for a walk, or participating in a hobby.  - Encouraged patient to reach out to trusted friends or family members about recent struggles

## 2020-03-24 NOTE — Assessment & Plan Note (Signed)
Blood pressure is stable on losartan.

## 2020-03-24 NOTE — Assessment & Plan Note (Signed)
Patient has chronic lower back pain which is usually managed with Tylenol and salon pass.

## 2020-03-24 NOTE — Assessment & Plan Note (Signed)
Is stable at the present time on Eliquis.  And amiodarone.

## 2020-03-24 NOTE — Assessment & Plan Note (Signed)
C/o lack of  Sleep  And restless  Both legs

## 2020-03-24 NOTE — Progress Notes (Signed)
Established Patient Office Visit  Subjective:  Patient ID: Meagan Wilcox, female    DOB: 09-20-1930  Age: 86 y.o. MRN: [id number]  CC:  Chief Complaint  Patient presents with  . Insomnia    Patient reports she is having trouble sleeping, pt states she wakes 7 to 8 times per night    Insomnia Primary symptoms: fragmented sleep, sleep disturbance.  How many beverages per day that contain caffeine: 0 - 1.  Types of beverages you drink: coffee. How long after going to bed to you fall asleep: over an hour.   Prior diagnostic workup includes:  No prior workup.  Anxiety Symptoms include insomnia.      Meagan Wilcox presents for  Insomnia and  anxiety  Past Medical History:  Diagnosis Date  . Abnormal mammogram, unspecified 2011  . Arthritis 2002  . Chronic pain syndrome 08/01/2017  . GERD (gastroesophageal reflux disease)   . Heart murmur 1985  . Hyperlipidemia   . Hypertension 2005  . Hypothyroidism   . Persistent atrial fibrillation/Atypical atrial flutter (HCC)    a. 04/2019 TEE/DCCV; b. 05/2019 Recurrent Aflutter-->Amio-->DCCV; c. CHA2DS2VASc = 4-->Eliquis.    Past Surgical History:  Procedure Laterality Date  . APPENDECTOMY  1939  . BREAST BIOPSY Left 02-24-14   benign  . BREAST BIOPSY Left 02/11/2016   neg  . BREAST BIOPSY Left 2013   neg  . BREAST EXCISIONAL BIOPSY Left 2014   neg  . BREAST SURGERY Left 2013   stereotactic biopsy   . BREAST SURGERY Left May 2010   1 mm foci of atypical ductal hyperplasia  . CARDIOVERSION N/A 05/08/2019   Procedure: CARDIOVERSION;  Surgeon: Debbe Odea, MD;  Location: [facility];  Service: Cardiovascular;  Laterality: N/A;  . CARDIOVERSION N/A 05/31/2019   Procedure: CARDIOVERSION;  Surgeon: Antonieta Iba, MD;  Location: [facility];  Service: Cardiovascular;  Laterality: N/A;  . cataract surgery  Bilateral 2009  . COLONOSCOPY  2010   Dr. Mechele Collin  . left breast biopsy   2009  . TEE WITHOUT CARDIOVERSION N/A 05/08/2019    Procedure: TRANSESOPHAGEAL ECHOCARDIOGRAM (TEE);  Surgeon: Debbe Odea, MD;  Location: [facility];  Service: Cardiovascular;  Laterality: N/A;    Family History  Problem Relation Age of Onset  . Heart disease Mother   . Heart disease Father   . Diabetes Father   . Stroke Father   . Arthritis Father     Social History   Socioeconomic History  . Marital status: Single    Spouse name: Not on file  . Number of children: Not on file  . Years of education: Not on file  . Highest education level: Not on file  Occupational History  . Not on file  Tobacco Use  . Smoking status: Never Smoker  . Smokeless tobacco: Never Used  Vaping Use  . Vaping Use: Never used  Substance and Sexual Activity  . Alcohol use: No    Alcohol/week: 0.0 standard drinks  . Drug use: No  . Sexual activity: Not on file  Other Topics Concern  . Not on file  Social History Narrative  . Not on file   Social Determinants of Health   Financial Resource Strain:   . Difficulty of Paying Living Expenses:   Food Insecurity:   . Worried About Programme researcher, broadcasting/film/video in the Last Year:   . Barista in the Last Year:   Transportation Needs:   . Freight forwarder (Medical):   Marland Kitchen  Lack of Transportation (Non-Medical):   Physical Activity:   . Days of Exercise per Week:   . Minutes of Exercise per Session:   Stress:   . Feeling of Stress :   Social Connections:   . Frequency of Communication with Friends and Family:   . Frequency of Social Gatherings with Friends and Family:   . Attends Religious Services:   . Active Member of Clubs or Organizations:   . Attends Banker Meetings:   Marland Kitchen Marital Status:   Intimate Partner Violence:   . Fear of Current or Ex-Partner:   . Emotionally Abused:   Marland Kitchen Physically Abused:   . Sexually Abused:      Current Outpatient Medications:  .  acetaminophen (TYLENOL) 500 MG tablet, Take 250 mg by mouth every 6 (six) hours as needed., Disp: , Rfl:  .   amiodarone (PACERONE) 100 MG tablet, Take 1 tablet (100 mg total) by mouth daily., Disp: 90 tablet, Rfl: 3 .  amitriptyline (ELAVIL) 25 MG tablet, TAKE 1 TABLET BY MOUTH DAILY, Disp: 90 tablet, Rfl: 3 .  apixaban (ELIQUIS) 5 MG TABS tablet, Take 1 tablet (5 mg total) by mouth 2 (two) times daily., Disp: 180 tablet, Rfl: 3 .  BIOTIN PO, Take by mouth daily., Disp: , Rfl:  .  ELMIRON 100 MG capsule, TAKE 1 CAPSULE BY MOUTH THREE TIMES DAILY, Disp: 90 capsule, Rfl: 3 .  furosemide (LASIX) 20 MG tablet, Take 1 tablet (20 mg total) by mouth daily., Disp: 90 tablet, Rfl: 3 .  levothyroxine (SYNTHROID, LEVOTHROID) 100 MCG tablet, Take 100 mcg by mouth daily., Disp: , Rfl:  .  losartan-hydrochlorothiazide (HYZAAR) 100-12.5 MG tablet, Take 1 tablet by mouth daily., Disp: 90 tablet, Rfl: 3 .  meclizine (ANTIVERT) 25 MG tablet, Take 25 mg by mouth as needed for dizziness., Disp: , Rfl:  .  omeprazole (PRILOSEC) 20 MG capsule, TAKE 1 TABLET BY MOUTH DAILY, Disp: 90 capsule, Rfl: 3 .  simvastatin (ZOCOR) 20 MG tablet, Take 20 mg by mouth every evening., Disp: , Rfl:  .  diazepam (VALIUM) 5 MG tablet, Take 1 tablet (5 mg total) by mouth every 12 (twelve) hours as needed for anxiety., Disp: 30 tablet, Rfl: 1   Allergies  Allergen Reactions  . Amoxicillin Diarrhea  . Penicillins Rash    Did it involve swelling of the face/tongue/throat, SOB, or low BP? No Did it involve sudden or severe rash/hives, skin peeling, or any reaction on the inside of your mouth or nose? No Did you need to seek medical attention at a hospital or doctor's office? Yes When did it last happen?65 years ago If all above answers are "NO", may proceed with cephalosporin use.    ROS Review of Systems  Constitutional: Negative.   HENT: Positive for hearing loss.   Eyes: Negative.  Negative for pain.  Respiratory: Negative.  Negative for chest tightness.   Cardiovascular: Negative.  Negative for leg swelling.  Gastrointestinal:  Negative.  Negative for abdominal distention.  Endocrine: Negative.  Negative for heat intolerance.  Genitourinary: Negative.  Negative for dysuria.  Musculoskeletal: Negative.   Skin: Negative.   Allergic/Immunologic: Negative.   Neurological: Negative.   Hematological: Negative.   Psychiatric/Behavioral: Positive for sleep disturbance. The patient has insomnia.   All other systems reviewed and are negative.     Objective:    Physical Exam Vitals reviewed.  Constitutional:      Appearance: Normal appearance.  HENT:     Mouth/Throat:  Mouth: Mucous membranes are moist.  Eyes:     Pupils: Pupils are equal, round, and reactive to light.  Neck:     Vascular: No carotid bruit.  Cardiovascular:     Rate and Rhythm: Normal rate and regular rhythm.     Pulses: Normal pulses.     Heart sounds: Normal heart sounds.  Pulmonary:     Effort: Pulmonary effort is normal.     Breath sounds: Normal breath sounds.  Abdominal:     General: Bowel sounds are normal.     Palpations: Abdomen is soft. There is no hepatomegaly, splenomegaly or mass.     Tenderness: There is no abdominal tenderness.     Hernia: No hernia is present.  Musculoskeletal:        General: No tenderness.     Cervical back: Neck supple.     Right lower leg: No edema.     Left lower leg: No edema.  Skin:    Findings: No rash.  Neurological:     Mental Status: She is alert and oriented to person, place, and time.     Motor: No weakness.  Psychiatric:        Mood and Affect: Mood and affect normal.        Behavior: Behavior normal.     BP (!) 157/60   Pulse 69   Ht 5\' 4"  (1.626 m)   Wt 165 lb 6.4 oz (75 kg)   BMI 28.39 kg/m  Wt Readings from Last 3 Encounters:  03/24/20 165 lb 6.4 oz (75 kg)  02/28/20 166 lb 8 oz (75.5 kg)  01/29/20 168 lb 4 oz (76.3 kg)     Health Maintenance Due  Topic Date Due  . COVID-19 Vaccine (1) Never done  . TETANUS/TDAP  Never done  . PNA vac Low Risk Adult (1 of 2 -  PCV13) Never done  . INFLUENZA VACCINE  03/15/2020    There are no preventive care reminders to display for this patient.  Lab Results  Component Value Date   TSH 3.670 10/22/2019   Lab Results  Component Value Date   WBC 7.8 10/22/2019   HGB 12.7 10/22/2019   HCT 38.1 10/22/2019   MCV 94 10/22/2019   PLT 274 10/22/2019   Lab Results  Component Value Date   NA 137 11/07/2019   K 3.8 11/07/2019   CO2 25 11/07/2019   GLUCOSE 122 (H) 11/07/2019   BUN 18 11/07/2019   CREATININE 0.98 11/07/2019   BILITOT <0.2 10/22/2019   ALKPHOS 68 10/22/2019   AST 18 10/22/2019   ALT 11 10/22/2019   PROT 6.6 10/22/2019   ALBUMIN 4.4 10/22/2019   CALCIUM 9.4 11/07/2019   ANIONGAP 9 11/07/2019   No results found for: CHOL No results found for: HDL No results found for: LDLCALC No results found for: TRIG No results found for: CHOLHDL No results found for: 11/09/2019    Assessment & Plan:   Problem List Items Addressed This Visit      Cardiovascular and Mediastinum   Essential hypertension    Blood pressure is stable on losartan.      Atrial flutter (HCC)    Is stable at the present time on Eliquis.  And amiodarone.        Musculoskeletal and Integument   Lumbar degenerative disc disease    Patient has chronic lower back pain which is usually managed with Tylenol and salon pass.        Other  Fatigue    C/o lack of  Sleep  And restless  Both legs      Anxiety - Primary    We will give her a prescription for Valium 5 mg p.o. at night as needed - Patient experiencing high levels of anxiety.  - Encouraged patient to engage in relaxing activities like yoga, meditation, journaling, going for a walk, or participating in a hobby.  - Encouraged patient to reach out to trusted friends or family members about recent struggles       Relevant Medications   diazepam (VALIUM) 5 MG tablet      Meds ordered this encounter  Medications  . diazepam (VALIUM) 5 MG tablet    Sig:  Take 1 tablet (5 mg total) by mouth every 12 (twelve) hours as needed for anxiety.    Dispense:  30 tablet    Refill:  1    Follow-up: No follow-ups on file.    Corky DownsJaved Logan Baltimore, MD

## 2020-05-27 ENCOUNTER — Other Ambulatory Visit: Payer: Self-pay | Admitting: Internal Medicine

## 2020-05-27 DIAGNOSIS — I480 Paroxysmal atrial fibrillation: Secondary | ICD-10-CM

## 2020-05-27 MED ORDER — APIXABAN 5 MG PO TABS: ORAL_TABLET | ORAL | 1 refills | Status: DC

## 2020-05-27 NOTE — Telephone Encounter (Signed)
°*  STAT* If patient is at the pharmacy, call can be transferred to refill team.   1. Which medications need to be refilled? (please list name of each medication and dose if known)   Eliquis 5 mg po BID  2. Which pharmacy/location (including street and city if local pharmacy) is medication to be sent to?[facility] [address] [place]   3. Do they need a 30 day or 90 day supply? 90

## 2020-05-27 NOTE — Telephone Encounter (Signed)
Please review for refill. Thanks!  

## 2020-05-27 NOTE — Telephone Encounter (Signed)
Pt's age 85, wt 75 kg, SCr 0.98, CrCl 47.88, last ov w/ CB 02/28/20.

## 2020-05-27 NOTE — Telephone Encounter (Signed)
Refill Request.  

## 2020-05-27 NOTE — Telephone Encounter (Signed)
Prescription refill request for Eliquis received. Indication: A flutter Last office visit: 02/28/20 Scr: 0.98 Age: 86 Weight:75kg

## 2020-06-25 ENCOUNTER — Encounter: Payer: Self-pay | Admitting: Family Medicine

## 2020-06-25 ENCOUNTER — Other Ambulatory Visit: Payer: Self-pay

## 2020-06-25 ENCOUNTER — Ambulatory Visit (INDEPENDENT_AMBULATORY_CARE_PROVIDER_SITE_OTHER): Payer: Medicare PPO | Admitting: Family Medicine

## 2020-06-25 VITALS — BP 148/54 | HR 65 | Ht 64.0 in | Wt 165.3 lb

## 2020-06-25 DIAGNOSIS — I10 Essential (primary) hypertension: Secondary | ICD-10-CM

## 2020-06-25 DIAGNOSIS — F419 Anxiety disorder, unspecified: Secondary | ICD-10-CM

## 2020-06-25 DIAGNOSIS — Z0000 Encounter for general adult medical examination without abnormal findings: Secondary | ICD-10-CM | POA: Insufficient documentation

## 2020-06-25 DIAGNOSIS — I483 Typical atrial flutter: Secondary | ICD-10-CM | POA: Diagnosis not present

## 2020-06-25 NOTE — Assessment & Plan Note (Signed)
Uses Valium for anxiety and insomnia very rarely. Needs refill today.

## 2020-06-25 NOTE — Assessment & Plan Note (Signed)
TDAP- N/a  Bone Density- N/a  Eye Exam- 2021 No acute issues.

## 2020-06-25 NOTE — Progress Notes (Signed)
Established Patient Office Visit  SUBJECTIVE:  Subjective  Patient ID: Meagan Wilcox, female    DOB: 1930-11-22  Age: 86 y.o. MRN: [id number]  CC:  Chief Complaint  Patient presents with  . Annual Exam    HPI Meagan Wilcox is a 86 y.o. female presenting today for     Past Medical History:  Diagnosis Date  . Abnormal mammogram, unspecified 2011  . Arthritis 2002  . Chronic pain syndrome 08/01/2017  . GERD (gastroesophageal reflux disease)   . Heart murmur 1985  . Hyperlipidemia   . Hypertension 2005  . Hypothyroidism   . Persistent atrial fibrillation/Atypical atrial flutter (HCC)    a. 04/2019 TEE/DCCV; b. 05/2019 Recurrent Aflutter-->Amio-->DCCV; c. CHA2DS2VASc = 4-->Eliquis.    Past Surgical History:  Procedure Laterality Date  . APPENDECTOMY  1939  . BREAST BIOPSY Left 02-24-14   benign  . BREAST BIOPSY Left 02/11/2016   neg  . BREAST BIOPSY Left 2013   neg  . BREAST EXCISIONAL BIOPSY Left 2014   neg  . BREAST SURGERY Left 2013   stereotactic biopsy   . BREAST SURGERY Left May 2010   1 mm foci of atypical ductal hyperplasia  . CARDIOVERSION N/A 05/08/2019   Procedure: CARDIOVERSION;  Surgeon: Debbe Odea, MD;  Location: [facility] ORS;  Service: Cardiovascular;  Laterality: N/A;  . CARDIOVERSION N/A 05/31/2019   Procedure: CARDIOVERSION;  Surgeon: Antonieta Iba, MD;  Location: [facility];  Service: Cardiovascular;  Laterality: N/A;  . cataract surgery  Bilateral 2009  . COLONOSCOPY  2010   Dr. Mechele Collin  . left breast biopsy   2009  . TEE WITHOUT CARDIOVERSION N/A 05/08/2019   Procedure: TRANSESOPHAGEAL ECHOCARDIOGRAM (TEE);  Surgeon: Debbe Odea, MD;  Location: [facility] ORS;  Service: Cardiovascular;  Laterality: N/A;    Family History  Problem Relation Age of Onset  . Heart disease Mother   . Heart disease Father   . Diabetes Father   . Stroke Father   . Arthritis Father     Social History   Socioeconomic History  . Marital status: Single      Spouse name: Not on file  . Number of children: Not on file  . Years of education: Not on file  . Highest education level: Not on file  Occupational History  . Not on file  Tobacco Use  . Smoking status: Never Smoker  . Smokeless tobacco: Never Used  Vaping Use  . Vaping Use: Never used  Substance and Sexual Activity  . Alcohol use: No    Alcohol/week: 0.0 standard drinks  . Drug use: No  . Sexual activity: Not on file  Other Topics Concern  . Not on file  Social History Narrative  . Not on file   Social Determinants of Health   Financial Resource Strain:   . Difficulty of Paying Living Expenses: Not on file  Food Insecurity:   . Worried About Programme researcher, broadcasting/film/video in the Last Year: Not on file  . Ran Out of Food in the Last Year: Not on file  Transportation Needs:   . Lack of Transportation (Medical): Not on file  . Lack of Transportation (Non-Medical): Not on file  Physical Activity:   . Days of Exercise per Week: Not on file  . Minutes of Exercise per Session: Not on file  Stress:   . Feeling of Stress : Not on file  Social Connections:   . Frequency of Communication with Friends and Family: Not on file  .  Frequency of Social Gatherings with Friends and Family: Not on file  . Attends Religious Services: Not on file  . Active Member of Clubs or Organizations: Not on file  . Attends Banker Meetings: Not on file  . Marital Status: Not on file  Intimate Partner Violence:   . Fear of Current or Ex-Partner: Not on file  . Emotionally Abused: Not on file  . Physically Abused: Not on file  . Sexually Abused: Not on file     Current Outpatient Medications:  .  acetaminophen (TYLENOL) 500 MG tablet, Take 250 mg by mouth every 6 (six) hours as needed., Disp: , Rfl:  .  amiodarone (PACERONE) 100 MG tablet, Take 1 tablet (100 mg total) by mouth daily., Disp: 90 tablet, Rfl: 3 .  amitriptyline (ELAVIL) 25 MG tablet, TAKE 1 TABLET BY MOUTH DAILY, Disp: 90  tablet, Rfl: 3 .  apixaban (ELIQUIS) 5 MG TABS tablet, TAKE 1 TABLET(5 MG) BY MOUTH TWICE DAILY, Disp: 180 tablet, Rfl: 1 .  BIOTIN PO, Take by mouth daily., Disp: , Rfl:  .  diazepam (VALIUM) 5 MG tablet, Take 5 mg by mouth at bedtime as needed for anxiety., Disp: , Rfl:  .  ELMIRON 100 MG capsule, TAKE 1 CAPSULE BY MOUTH THREE TIMES DAILY, Disp: 90 capsule, Rfl: 3 .  furosemide (LASIX) 20 MG tablet, Take 1 tablet (20 mg total) by mouth daily., Disp: 90 tablet, Rfl: 3 .  levothyroxine (SYNTHROID, LEVOTHROID) 100 MCG tablet, Take 100 mcg by mouth daily., Disp: , Rfl:  .  losartan-hydrochlorothiazide (HYZAAR) 100-12.5 MG tablet, Take 1 tablet by mouth daily., Disp: 90 tablet, Rfl: 3 .  meclizine (ANTIVERT) 25 MG tablet, Take 25 mg by mouth as needed for dizziness., Disp: , Rfl:  .  omeprazole (PRILOSEC) 20 MG capsule, TAKE 1 TABLET BY MOUTH DAILY, Disp: 90 capsule, Rfl: 3 .  simvastatin (ZOCOR) 20 MG tablet, Take 20 mg by mouth every evening., Disp: , Rfl:    Allergies  Allergen Reactions  . Amoxicillin Diarrhea  . Penicillins Rash    Did it involve swelling of the face/tongue/throat, SOB, or low BP? No Did it involve sudden or severe rash/hives, skin peeling, or any reaction on the inside of your mouth or nose? No Did you need to seek medical attention at a hospital or doctor's office? Yes When did it last happen?65 years ago If all above answers are "NO", may proceed with cephalosporin use.    ROS Review of Systems  Constitutional: Negative.   HENT: Negative.   Respiratory: Negative.   Cardiovascular: Negative.  Negative for palpitations.  Gastrointestinal: Positive for diarrhea.  Genitourinary: Negative.   Musculoskeletal: Negative.   Psychiatric/Behavioral: Negative.   All other systems reviewed and are negative.    OBJECTIVE:    Physical Exam Vitals and nursing note reviewed.  Constitutional:      Appearance: Normal appearance.  Eyes:     Pupils: Pupils are equal,  round, and reactive to light.  Cardiovascular:     Rate and Rhythm: Normal rate.     Heart sounds: Murmur heard.   Musculoskeletal:        General: Normal range of motion.     Cervical back: Normal range of motion.  Skin:    General: Skin is warm.  Neurological:     General: No focal deficit present.  Psychiatric:        Mood and Affect: Mood normal.     BP (!) 148/54  Pulse 65   Ht 5\' 4"  (1.626 m)   Wt 165 lb 4.8 oz (75 kg)   BMI 28.37 kg/m  Wt Readings from Last 3 Encounters:  06/25/20 165 lb 4.8 oz (75 kg)  03/24/20 165 lb 6.4 oz (75 kg)  02/28/20 166 lb 8 oz (75.5 kg)    Health Maintenance Due  Topic Date Due  . TETANUS/TDAP  Never done  . PNA vac Low Risk Adult (1 of 2 - PCV13) Never done    There are no preventive care reminders to display for this patient.  CBC Latest Ref Rng & Units 10/22/2019 05/28/2019 05/09/2019  WBC 3.4 - 10.8 x10E3/uL 7.8 8.4 7.2  Hemoglobin 11.1 - 15.9 g/dL 05/11/2019 71.2 11.3(L)  Hematocrit 34.0 - 46.6 % 38.1 41.1 33.7(L)  Platelets 150 - 450 x10E3/uL 274 236 200   CMP Latest Ref Rng & Units 11/07/2019 10/22/2019 05/28/2019  Glucose 70 - 99 mg/dL 05/30/2019) 099(I) 338(S)  BUN 8 - 23 mg/dL 18 18 17   Creatinine 0.44 - 1.00 mg/dL 505(L ) 9.76  Sodium 135 - 145 mmol/L 137 135 140  Potassium 3.5 - 5.1 mmol/L 3.8 4.3 4.1  Chloride 98 - 111 mmol/L 103 101 103  CO2 22 - 32 mmol/L 25 23 27   Calcium 8.9 - 10.3 mg/dL 9.4 9.8 7.34(L  Total Protein 6.0 - 8.5 g/dL - 6.6 7.2  Total Bilirubin 0.0 - 1.2 mg/dL - 9.37 0.6  Alkaline Phos 39 - 117 IU/L - 68 46  AST 0 - 40 IU/L - 18 17  ALT 0 - 32 IU/L - 11 12    Lab Results  Component Value Date   TSH 3.670 10/22/2019   Lab Results  Component Value Date   ALBUMIN 4.4 10/22/2019   ANIONGAP 9 11/07/2019   No results found for: CHOL, HDL, LDLCALC, CHOLHDL No results found for: TRIG No results found for: HGBA1C    ASSESSMENT & PLAN:   Problem List Items Addressed This Visit      Cardiovascular  and Mediastinum   Essential hypertension    HTN well controlled on current meds.       Atrial flutter (HCC)    Taking Eliquis as rx, no episodes of rapid HR or palpatations.         Other   Anxiety - Primary    Uses Valium for anxiety and insomnia very rarely. Needs refill today.       Relevant Medications   diazepam (VALIUM) 5 MG tablet   Annual physical exam    TDAP- N/a  Bone Density- N/a  Eye Exam- 2021 No acute issues.          No orders of the defined types were placed in this encounter.     Follow-up: No follow-ups on file.    12/22/2019, FNP [facility] [address] [place], [facility] [address]

## 2020-06-25 NOTE — Assessment & Plan Note (Signed)
HTN well controlled on current meds.

## 2020-06-25 NOTE — Assessment & Plan Note (Signed)
Taking Eliquis as rx, no episodes of rapid HR or palpatations.

## 2020-06-29 NOTE — Progress Notes (Signed)
Follow-up Outpatient Visit Date: 07/01/2020  Primary Care Provider: Corky Downs, MD [address] [place] [address]  Chief Complaint: Follow-up atrial fibrillation  HPI:  Meagan Wilcox is a 86 y.o. female with history of atrial fibrillation/flutter, hypertension, hyperlipidemia, hypothyroidism, GERD, and arthritis, who presents for follow-up of atrial fibrillation/flutter.  I last saw her in June, at which time Meagan Wilcox had several concerns.  She complained of episodic nausea, particularly when riding in a car.  She also noted waxing and waning leg edema (left > right), as well as increased fatigue when working in her yard.  We agreed to stop amlodipine and decrease amiodarone to 100 mg daily to see if her symptoms would improve.  She reported significant improvement at her follow-up visit a month later with Meagan Givens, Meagan Wilcox.  Today, Meagan Wilcox reports that she has been feeling well.  She denies chest pain, shortness of breath, palpitations, lightheadedness, and edema.  She is tolerating her medications well.  She has not had any bleeding or falls.  --------------------------------------------------------------------------------------------------  Past Medical History:  Diagnosis Date   Abnormal mammogram, unspecified 2011   Arthritis 2002   Chronic pain syndrome 08/01/2017   GERD (gastroesophageal reflux disease)    Heart murmur 1985   Hyperlipidemia    Hypertension 2005   Hypothyroidism    Persistent atrial fibrillation/Atypical atrial flutter (HCC)    a. 04/2019 TEE/DCCV; b. 05/2019 Recurrent Aflutter-->Amio-->DCCV; c. CHA2DS2VASc = 4-->Eliquis.   Past Surgical History:  Procedure Laterality Date   APPENDECTOMY  1939   BREAST BIOPSY Left 02-24-14   benign   BREAST BIOPSY Left 02/11/2016   neg   BREAST BIOPSY Left 2013   neg   BREAST EXCISIONAL BIOPSY Left 2014   neg   BREAST SURGERY Left 2013   stereotactic biopsy    BREAST SURGERY Left May 2010   1 mm  foci of atypical ductal hyperplasia   CARDIOVERSION N/A 05/08/2019   Procedure: CARDIOVERSION;  Surgeon: Debbe Odea, MD;  Location: [facility];  Service: Cardiovascular;  Laterality: N/A;   CARDIOVERSION N/A 05/31/2019   Procedure: CARDIOVERSION;  Surgeon: Antonieta Iba, MD;  Location: [facility];  Service: Cardiovascular;  Laterality: N/A;   cataract surgery  Bilateral 2009   COLONOSCOPY  2010   Dr. Mechele Collin   left breast biopsy   2009   TEE WITHOUT CARDIOVERSION N/A 05/08/2019   Procedure: TRANSESOPHAGEAL ECHOCARDIOGRAM (TEE);  Surgeon: Debbe Odea, MD;  Location: [facility] ORS;  Service: Cardiovascular;  Laterality: N/A;    Current Meds  Medication Sig   acetaminophen (TYLENOL) 500 MG tablet Take 250 mg by mouth every 6 (six) hours as needed.   amiodarone (PACERONE) 100 MG tablet Take 1 tablet (100 mg total) by mouth daily.   amitriptyline (ELAVIL) 25 MG tablet TAKE 1 TABLET BY MOUTH DAILY   apixaban (ELIQUIS) 5 MG TABS tablet TAKE 1 TABLET(5 MG) BY MOUTH TWICE DAILY   BIOTIN PO Take by mouth daily.   diazepam (VALIUM) 5 MG tablet Take 5 mg by mouth at bedtime as needed for anxiety.   ELMIRON 100 MG capsule TAKE 1 CAPSULE BY MOUTH THREE TIMES DAILY   furosemide (LASIX) 20 MG tablet Take 1 tablet (20 mg total) by mouth daily.   levothyroxine (SYNTHROID, LEVOTHROID) 100 MCG tablet Take 100 mcg by mouth daily.   losartan-hydrochlorothiazide (HYZAAR) 100-12.5 MG tablet Take 1 tablet by mouth daily.   meclizine (ANTIVERT) 25 MG tablet Take 25 mg by mouth as needed for dizziness.  omeprazole (PRILOSEC) 20 MG capsule TAKE 1 TABLET BY MOUTH DAILY   simvastatin (ZOCOR) 20 MG tablet Take 20 mg by mouth every evening.    Allergies: Amoxicillin and Penicillins  Social History   Tobacco Use   Smoking status: Never Smoker   Smokeless tobacco: Never Used  Vaping Use   Vaping Use: Never used  Substance Use Topics   Alcohol use: No    Alcohol/week: 0.0  standard drinks   Drug use: No    Family History  Problem Relation Age of Onset   Heart disease Mother    Heart disease Father    Diabetes Father    Stroke Father    Arthritis Father     Review of Systems: A 12-system review of systems was performed and was negative except as noted in the HPI.  --------------------------------------------------------------------------------------------------  Physical Exam: BP 136/68 (BP Location: Left Arm, Patient Position: Sitting, Cuff Size: Normal)    Pulse (!) 56    Ht 5\' 4"  (1.626 m)    Wt 166 lb (75.3 kg)    SpO2 97%    BMI 28.49 kg/m   General: NAD. Neck: No JVD or HJR. Lungs: Clear to auscultation bilaterally without wheezes or crackles. Heart: Bradycardic but regular without murmurs, rubs, or gallops. Abdomen: Soft, nontender, nondistended. Extremities: No lower extremity edema.  EKG: Sinus bradycardia with incomplete RBBB and LVH with abnormal repolarization.  Incomplete RBBB is new since 02/28/2020.  Lab Results  Component Value Date   WBC 7.1 07/01/2020   HGB 12.4 07/01/2020   HCT 35.5 07/01/2020   MCV 92 07/01/2020   PLT 218 07/01/2020    Lab Results  Component Value Date   NA 135 07/01/2020   K 4.1 07/01/2020   CL 97 07/01/2020   CO2 23 07/01/2020   BUN 16 07/01/2020   CREATININE 0.87 07/01/2020   GLUCOSE 94 07/01/2020   ALT 15 07/01/2020    No results found for: CHOL, HDL, LDLCALC, LDLDIRECT, TRIG, CHOLHDL  --------------------------------------------------------------------------------------------------  ASSESSMENT AND PLAN: Atrial fibrillation/flutter: Meagan Wilcox is maintaining sinus rhythm and overall is feeling better with de-escalation of amiodarone 100 mg daily.  She has not had any falls or bleeding.  We will continue to current doses of amiodarone and apixaban.  I will check a CBC, CMP, TSH, and chest radiograph today for monitoring of potential amiodarone toxicities.  Hypertension: Blood  pressure upper normal.  Leg edema improved with discontinuation of amlodipine.  No medication changes planned for today.  Follow-up: Return to clinic in 6 months.  Theresia Lo, MD 07/02/2020 7:37 AM

## 2020-07-01 ENCOUNTER — Encounter: Payer: Self-pay | Admitting: Internal Medicine

## 2020-07-01 ENCOUNTER — Ambulatory Visit: Payer: Medicare PPO | Admitting: Internal Medicine

## 2020-07-01 ENCOUNTER — Other Ambulatory Visit: Payer: Self-pay

## 2020-07-01 ENCOUNTER — Ambulatory Visit
Admission: RE | Admit: 2020-07-01 | Discharge: 2020-07-01 | Disposition: A | Discharge status: Home / Self Care | Payer: Medicare PPO | Source: Ambulatory Visit | Attending: Internal Medicine | Admitting: Internal Medicine

## 2020-07-01 VITALS — BP 136/68 | HR 56 | Ht 64.0 in | Wt 166.0 lb

## 2020-07-01 DIAGNOSIS — I480 Paroxysmal atrial fibrillation: Secondary | ICD-10-CM

## 2020-07-01 DIAGNOSIS — Z79899 Other long term (current) drug therapy: Secondary | ICD-10-CM

## 2020-07-01 DIAGNOSIS — I4891 Unspecified atrial fibrillation: Secondary | ICD-10-CM | POA: Diagnosis not present

## 2020-07-01 DIAGNOSIS — I4892 Unspecified atrial flutter: Secondary | ICD-10-CM | POA: Diagnosis not present

## 2020-07-01 DIAGNOSIS — I10 Essential (primary) hypertension: Secondary | ICD-10-CM

## 2020-07-01 DIAGNOSIS — J9811 Atelectasis: Secondary | ICD-10-CM | POA: Diagnosis not present

## 2020-07-01 NOTE — Patient Instructions (Signed)
Medication Instructions:  Your physician recommends that you continue on your current medications as directed. Please refer to the Current Medication list given to you today.  *If you need a refill on your cardiac medications before your next appointment, please call your pharmacy*   Lab Work: Your physician recommends that you return for lab work in: TODAY - cbc, cmp, tsh.  If you have labs (blood work) drawn today and your tests are completely normal, you will receive your results only by: Marland Kitchen MyChart Message (if you have MyChart) OR . A paper copy in the mail If you have any lab test that is abnormal or we need to change your treatment, we will call you to review the results.   Testing/Procedures:  A chest x-ray takes a picture of the organs and structures inside the chest, including the heart, lungs, and blood vessels. This test can show several things, including, whether the heart is enlarges; whether fluid is building up in the lungs; and whether pacemaker / defibrillator leads are still in place. - Please go to the [facility]. You will check in at the front desk to the right as you walk into the atrium. Valet Parking is offered if needed. - No appointment needed.    Follow-Up: At [facility], you and your health needs are our priority.  As part of our continuing mission to provide you with exceptional heart care, we have created designated Provider Care Teams.  These Care Teams include your primary Cardiologist (physician) and Advanced Practice Providers (APPs -  Physician Assistants and Nurse Practitioners) who all work together to provide you with the care you need, when you need it.  We recommend signing up for the patient portal called "MyChart".  Sign up information is provided on this After Visit Summary.  MyChart is used to connect with patients for Virtual Visits (Telemedicine).  Patients are able to view lab/test results, encounter notes, upcoming appointments, etc.   Non-urgent messages can be sent to your provider as well.   To learn more about what you can do with MyChart, go to [email or web address].    Your next appointment:   6 month(s)  The format for your next appointment:   In Person  Provider:   You may see Yvonne Kendall, MD or one of the following Advanced Practice Providers on your designated Care Team:    Nicolasa Ducking, NP  Eula Listen, PA-C  Marisue Ivan, PA-C  Cadence [place], [place]  Gillian Shields, NP

## 2020-07-02 DIAGNOSIS — Z79899 Other long term (current) drug therapy: Secondary | ICD-10-CM | POA: Insufficient documentation

## 2020-07-02 LAB — COMPREHENSIVE METABOLIC PANEL
ALT: 15 IU/L (ref 0–32)
AST: 20 IU/L (ref 0–40)
Albumin/Globulin Ratio: 2 (ref 1.2–2.2)
Albumin: 4.3 g/dL (ref 3.6–4.6)
Alkaline Phosphatase: 59 IU/L (ref 44–121)
BUN/Creatinine Ratio: 18 (ref 12–28)
BUN: 16 mg/dL (ref 8–27)
Bilirubin Total: 0.3 mg/dL (ref 0.0–1.2)
CO2: 23 mmol/L (ref 20–29)
Calcium: 9.6 mg/dL (ref 8.7–10.3)
Chloride: 97 mmol/L (ref 96–106)
Creatinine, Ser: 0.87 mg/dL (ref 0.57–1.00)
GFR calc Af Amer: 69 mL/min/{1.73_m2} (ref >59)
GFR calc non Af Amer: 60 mL/min/{1.73_m2} (ref >59)
Globulin, Total: 2.2 g/dL (ref 1.5–4.5)
Glucose: 94 mg/dL (ref 65–99)
Potassium: 4.1 mmol/L (ref 3.5–5.2)
Sodium: 135 mmol/L (ref 134–144)
Total Protein: 6.5 g/dL (ref 6.0–8.5)

## 2020-07-02 LAB — CBC
Hematocrit: 35.5 % (ref 34.0–46.6)
Hemoglobin: 12.4 g/dL (ref 11.1–15.9)
MCH: 32.3 pg (ref 26.6–33.0)
MCHC: 34.9 g/dL (ref 31.5–35.7)
MCV: 92 fL (ref 79–97)
Platelets: 218 10*3/uL (ref 150–450)
RBC: 3.84 x10E6/uL (ref 3.77–5.28)
RDW: 13.3 % (ref 11.7–15.4)
WBC: 7.1 10*3/uL (ref 3.4–10.8)

## 2020-07-02 LAB — TSH: TSH: 5.39 u[IU]/mL — ABNORMAL HIGH (ref 0.450–4.500)

## 2020-07-06 ENCOUNTER — Other Ambulatory Visit: Payer: Self-pay

## 2020-07-06 MED ORDER — LEVOTHYROXINE SODIUM 125 MCG PO TABS: 125.0000 ug | ORAL_TABLET | Freq: Every day | ORAL | 3 refills | Status: DC

## 2020-07-18 ENCOUNTER — Other Ambulatory Visit: Payer: Self-pay | Admitting: Internal Medicine

## 2020-08-11 DIAGNOSIS — R059 Cough, unspecified: Secondary | ICD-10-CM | POA: Diagnosis not present

## 2020-08-11 DIAGNOSIS — R5383 Other fatigue: Secondary | ICD-10-CM | POA: Diagnosis not present

## 2020-08-11 DIAGNOSIS — Z03818 Encounter for observation for suspected exposure to other biological agents ruled out: Secondary | ICD-10-CM | POA: Diagnosis not present

## 2020-08-11 DIAGNOSIS — J22 Unspecified acute lower respiratory infection: Secondary | ICD-10-CM | POA: Diagnosis not present

## 2020-08-11 DIAGNOSIS — I517 Cardiomegaly: Secondary | ICD-10-CM | POA: Diagnosis not present

## 2020-09-11 ENCOUNTER — Other Ambulatory Visit: Payer: Self-pay | Admitting: Internal Medicine

## 2020-10-14 ENCOUNTER — Telehealth: Payer: Self-pay | Admitting: Internal Medicine

## 2020-10-14 NOTE — Telephone Encounter (Signed)
Pt c/o BP issue: STAT if pt c/o blurred vision, one-sided weakness or slurred speech  1. What are your last 5 BP readings?  186/84 HR 60 197/71   2. Are you having any other symptoms (ex. Dizziness, headache, blurred vision, passed out)? Denies symptoms but does not feel well   3. What is your BP issue? Patient is concerned about BP, thinks it could be due to medication.  Please call to discuss.

## 2020-10-15 NOTE — Telephone Encounter (Signed)
Spoke to patient. Reports her SBP has been running higher than usual since Sunday.   2/27 Sunday 5pm 143/60, 53  2/28 Monday 8 pm 176/76, 59 After taking this reading she continued to take BP through the night and it got up as high as 197/71.  This concerned her very much.  3/1 Tuesday 8:15a 149/68, 58     2pm 173/73, 62  3/2 Wednesday 8:15a 147/68, 58     2pm 173/72, 62   3/3 Thursday 5:30a 142/62, 65    7:30a 151/69, 67  Prior to this past week it was not running greater than 140's so much. Yesterday, patient's niece came to sit with her because she was feeling weak. Denies headache, blurred vision, or shortness of breath. She's been taking multiple BP readings throughout the day in a row.   Admitted she is feeling stressed about getting her income taxes submitted as well as her best friend passed away last 12/04/22. The funeral is today. She is taking her medications as prescribed.   Advised patient that taking her BP too much can also cause anxiety and increase her BP. Advised her to take her BP about 2 hours after her morning medications and make sure she is sitting and relaxed for at least 5-10 min prior to. Advised her she could take one reading in the morning and one in the evening over the next 3-5 days and call us with the readings.  She verbalized understanding and will let us know.

## 2020-10-15 NOTE — Telephone Encounter (Signed)
Elevated blood pressure is often a response to some other physiologic or emotional stressor, particularly if medications have not recently changed.  I agree with periodic monitoring of BP (does not need to be multiple times a day).  Hopefully, her BP will normalize as her stress improves.  If she continues to feel week or otherwise unwell, I suggest that she see her PCP for further evaluation.  Yvonne Kendall, MD [facility]

## 2020-10-16 NOTE — Telephone Encounter (Signed)
Patient notified of Dr End's recommendations and verbalized understanding.  

## 2020-11-06 ENCOUNTER — Other Ambulatory Visit: Payer: Self-pay | Admitting: Family

## 2020-11-14 ENCOUNTER — Other Ambulatory Visit: Payer: Self-pay | Admitting: Internal Medicine

## 2020-12-22 ENCOUNTER — Other Ambulatory Visit: Payer: Self-pay | Admitting: Internal Medicine

## 2020-12-30 ENCOUNTER — Encounter: Payer: Self-pay | Admitting: Internal Medicine

## 2020-12-30 ENCOUNTER — Other Ambulatory Visit: Payer: Self-pay

## 2020-12-30 ENCOUNTER — Ambulatory Visit (INDEPENDENT_AMBULATORY_CARE_PROVIDER_SITE_OTHER): Payer: Medicare PPO

## 2020-12-30 ENCOUNTER — Ambulatory Visit: Payer: Medicare PPO | Admitting: Internal Medicine

## 2020-12-30 VITALS — BP 160/66 | HR 57 | Ht 64.0 in | Wt 164.0 lb

## 2020-12-30 DIAGNOSIS — I10 Essential (primary) hypertension: Secondary | ICD-10-CM | POA: Diagnosis not present

## 2020-12-30 DIAGNOSIS — R0602 Shortness of breath: Secondary | ICD-10-CM

## 2020-12-30 DIAGNOSIS — I4819 Other persistent atrial fibrillation: Secondary | ICD-10-CM | POA: Diagnosis not present

## 2020-12-30 DIAGNOSIS — Z79899 Other long term (current) drug therapy: Secondary | ICD-10-CM

## 2020-12-30 DIAGNOSIS — I480 Paroxysmal atrial fibrillation: Secondary | ICD-10-CM

## 2020-12-30 DIAGNOSIS — I5032 Chronic diastolic (congestive) heart failure: Secondary | ICD-10-CM

## 2020-12-30 DIAGNOSIS — E785 Hyperlipidemia, unspecified: Secondary | ICD-10-CM | POA: Diagnosis not present

## 2020-12-30 MED ORDER — LOSARTAN POTASSIUM-HCTZ 100-12.5 MG PO TABS: 1.0000 | ORAL_TABLET | Freq: Every day | ORAL | 1 refills | Status: DC

## 2020-12-30 MED ORDER — AMIODARONE HCL 100 MG PO TABS: 100.0000 mg | ORAL_TABLET | Freq: Every day | ORAL | 1 refills | Status: DC

## 2020-12-30 NOTE — Patient Instructions (Addendum)
Medication Instructions:  - Your physician recommends that you continue on your current medications as directed. Please refer to the Current Medication list given to you today.  *If you need a refill on your cardiac medications before your next appointment, please call your pharmacy*   Lab Work: - Your physician recommends that you have lab work today: CBC/ CMET/ TSH  If you have labs (blood work) drawn today and your tests are completely normal, you will receive your results only by: Marland Kitchen MyChart Message (if you have MyChart) OR . A paper copy in the mail If you have any lab test that is abnormal or we need to change your treatment, we will call you to review the results.   Testing/Procedures: 1) 14 day ZIO XT (heart) monitor- to be placed in office today (12/30/20- 01/13/21)  Your physician has recommended that you wear a Zio monitor.   This monitor is a medical device that records the heart's electrical activity. Doctors most often use these monitors to diagnose arrhythmias. Arrhythmias are problems with the speed or rhythm of the heartbeat. The monitor is a small device applied to your chest. You can wear one while you do your normal daily activities. While wearing this monitor if you have any symptoms to push the button and record what you felt. Once you have worn this monitor for the period of time provider prescribed (Usually 14 days), you will return the monitor device in the postage paid box. Once it is returned they will download the data collected and provide [place] with a report which the provider will then review and we will call you with those results. Important tips:  1. Avoid showering during the first 24 hours of wearing the monitor. 2. Avoid excessive sweating to help maximize wear time. 3. Do not submerge the device, no hot tubs, and no swimming pools. 4. Keep any lotions or oils away from the patch. 5. After 24 hours you may shower with the patch on. Take brief showers with your  back facing the shower head.  6. Do not remove patch once it has been placed because that will interrupt data and decrease adhesive wear time. 7. Push the button when you have any symptoms and write down what you were feeling. 8. Once you have completed wearing your monitor, remove and place into box which has postage paid and place in your outgoing mailbox.  9. If for some reason you have misplaced your box then call our office and we can provide another box and/or mail it off for you.   2) Echocardiogram: - Your physician has requested that you have an echocardiogram- After 01/13/21. Echocardiography is a painless test that uses sound waves to create images of your heart. It provides your doctor with information about the size and shape of your heart and how well your heart's chambers and valves are working. This procedure takes approximately one hour. There are no restrictions for this procedure. There is a possibility that an IV may need to be started during your test to inject an image enhancing agent. This is done to obtain more optimal pictures of your heart. Therefore we ask that you do at least drink some water prior to coming in to hydrate your veins.     Follow-Up: At [facility], you and your health needs are our priority.  As part of our continuing mission to provide you with exceptional heart care, we have created designated Provider Care Teams.  These Care Teams include your  primary Cardiologist (physician) and Advanced Practice Providers (APPs -  Physician Assistants and Nurse Practitioners) who all work together to provide you with the care you need, when you need it.  We recommend signing up for the patient portal called "MyChart".  Sign up information is provided on this After Visit Summary.  MyChart is used to connect with patients for Virtual Visits (Telemedicine).  Patients are able to view lab/test results, encounter notes, upcoming appointments, etc.  Non-urgent messages can  be sent to your provider as well.   To learn more about what you can do with MyChart, go to [email or web address].    Your next appointment:   2 month(s)  The format for your next appointment:   In Person  Provider:   You may see Yvonne Kendall, MD or one of the following Advanced Practice Providers on your designated Care Team:    Nicolasa Ducking, NP  Eula Listen, PA-C  Marisue Ivan, PA-C  Cadence [place], [place]  Gillian Shields, NP    Other Instructions n/a

## 2020-12-30 NOTE — Progress Notes (Signed)
Follow-up Outpatient Visit Date: 12/30/2020  Primary Care Provider: Corky Downs, MD [address] [place] [address]  Chief Complaint: Fatigue and exertional dyspnea.  HPI:  Meagan Wilcox is a 87 y.o. female with history of atrial fibrillation/flutter, hypertension, hyperlipidemia, hypothyroidism, GERD, and arthritis, who presents for follow-up of atrial fibrillation/flutter.  I last saw her in 06/2020, at which time she was doing well.  She was maintaining sinus rhythm and overall felt better with decreased dose of amiodarone to 100 mg daily.  Today, Meagan Wilcox feels like she has been a little bit more short of breath and fatigued with activities.  She is checking her blood pressure and heart rate regularly at home and notes that her blood pressures are typically normal to mildly elevated in the 120-160/60-80 range.  She has had some heart rates in the mid 40s.  She denies chest pain, palpitations, lightheadedness, and edema.  She remains compliant with her medications and denies bleeding.  --------------------------------------------------------------------------------------------------  Past Medical History:  Diagnosis Date  . Abnormal mammogram, unspecified 2011  . Arthritis 2002  . Chronic pain syndrome 08/01/2017  . GERD (gastroesophageal reflux disease)   . Heart murmur 1985  . Hyperlipidemia   . Hypertension 2005  . Hypothyroidism   . Persistent atrial fibrillation/Atypical atrial flutter (HCC)    a. 04/2019 TEE/DCCV; b. 05/2019 Recurrent Aflutter-->Amio-->DCCV; c. CHA2DS2VASc = 4-->Eliquis.   Past Surgical History:  Procedure Laterality Date  . APPENDECTOMY  1939  . BREAST BIOPSY Left 02-24-14   benign  . BREAST BIOPSY Left 02/11/2016   neg  . BREAST BIOPSY Left 2013   neg  . BREAST EXCISIONAL BIOPSY Left 2014   neg  . BREAST SURGERY Left 2013   stereotactic biopsy   . BREAST SURGERY Left May 2010   1 mm foci of atypical ductal hyperplasia  . CARDIOVERSION N/A  05/08/2019   Procedure: CARDIOVERSION;  Surgeon: Debbe Odea, MD;  Location: [facility];  Service: Cardiovascular;  Laterality: N/A;  . CARDIOVERSION N/A 05/31/2019   Procedure: CARDIOVERSION;  Surgeon: Antonieta Iba, MD;  Location: [facility];  Service: Cardiovascular;  Laterality: N/A;  . cataract surgery  Bilateral 2009  . COLONOSCOPY  2010   Dr. Mechele Collin  . left breast biopsy   2009  . TEE WITHOUT CARDIOVERSION N/A 05/08/2019   Procedure: TRANSESOPHAGEAL ECHOCARDIOGRAM (TEE);  Surgeon: Debbe Odea, MD;  Location: [facility];  Service: Cardiovascular;  Laterality: N/A;    Current Meds  Medication Sig  . amitriptyline (ELAVIL) 25 MG tablet TAKE 1 TABLET BY MOUTH DAILY  . apixaban (ELIQUIS) 5 MG TABS tablet TAKE 1 TABLET(5 MG) BY MOUTH TWICE DAILY  . BIOTIN PO Take by mouth daily.  . diazepam (VALIUM) 5 MG tablet Take 5 mg by mouth at bedtime as needed for anxiety.  Marland Kitchen ELMIRON 100 MG capsule TAKE 1 CAPSULE BY MOUTH THREE TIMES DAILY  . furosemide (LASIX) 20 MG tablet TAKE 1 TABLET(20 MG) BY MOUTH DAILY  . levothyroxine (SYNTHROID) 125 MCG tablet Take 1 tablet (125 mcg total) by mouth daily.  . meclizine (ANTIVERT) 25 MG tablet Take 25 mg by mouth as needed for dizziness.  . Multiple Vitamin (MULTIVITAMIN) capsule Take 1 capsule by mouth daily.  . naproxen sodium (ALEVE) 220 MG tablet Take 220 mg by mouth daily as needed.  Marland Kitchen omeprazole (PRILOSEC) 20 MG capsule TAKE 1 TABLET BY MOUTH DAILY  . simvastatin (ZOCOR) 20 MG tablet TAKE 1 TABLET BY MOUTH DAILY  . [DISCONTINUED] amiodarone (PACERONE) 100  MG tablet Take 1 tablet (100 mg total) by mouth daily.  . [DISCONTINUED] losartan-hydrochlorothiazide (HYZAAR) 100-12.5 MG tablet Take 1 tablet by mouth daily.    Allergies: Amoxicillin and Penicillins  Social History   Tobacco Use  . Smoking status: Never Smoker  . Smokeless tobacco: Never Used  Vaping Use  . Vaping Use: Never used  Substance Use Topics  . Alcohol use: No     Alcohol/week: 0.0 standard drinks  . Drug use: No    Family History  Problem Relation Age of Onset  . Heart disease Mother   . Heart disease Father   . Diabetes Father   . Stroke Father   . Arthritis Father     Review of Systems: A 12-system review of systems was performed and was negative except as noted in the HPI.  --------------------------------------------------------------------------------------------------  Physical Exam: BP (!) 160/66 (BP Location: Right Arm, Patient Position: Sitting, Cuff Size: Large)   Pulse (!) 57   Ht 5\' 4"  (1.626 m)   Wt 164 lb (74.4 kg)   SpO2 96%   BMI 28.15 kg/m   General:  NAD. Neck: No JVD or HJR. Lungs: Clear to auscultation bilaterally without wheezes or crackles. Heart: Regular rate and rhythm without murmurs, rubs, or gallops. Abdomen: Soft, nontender, nondistended. Extremities: No lower extremity edema.  EKG: Sinus bradycardia with borderline LVH and nonspecific ST/T changes.  No significant change since prior tracing on 07/01/2020.  Lab Results  Component Value Date   WBC 6.1 12/30/2020   HGB 12.7 12/30/2020   HCT 37.7 12/30/2020   MCV 92 12/30/2020   PLT 209 12/30/2020    Lab Results  Component Value Date   NA 137 12/30/2020   K 4.1 12/30/2020   CL 98 12/30/2020   CO2 22 12/30/2020   BUN 20 12/30/2020   CREATININE 1.07 (H) 12/30/2020   GLUCOSE 102 (H) 12/30/2020   ALT 12 12/30/2020    No results found for: CHOL, HDL, LDLCALC, LDLDIRECT, TRIG, CHOLHDL  --------------------------------------------------------------------------------------------------  ASSESSMENT AND PLAN: Persistent atrial fibrillation: Meagan Wilcox is maintaining sinus rhythm on low-dose amiodarone.  She has noted some low heart rates at home with associated fatigue and progressive exertional dyspnea.  I have recommended obtaining a 14-day event monitor to exclude PAF contributing to her symptoms as well as significant bradycardia.  She is not on  any AV nodal blocking or rate lowering agents besides amiodarone.  I have also recommended repeating an echocardiogram.  We will plan to continue indefinite anticoagulation with apixaban 5 mg daily (appropriate dose based on renal function and weight) in the setting of a CHA2DS2-VASc score of 4 (age x2, gender, and hypertension).  I will recheck a CBC, CMP, and TSH today.  Hypertension: Blood pressure moderately elevated today but typically better at home.  In light of her fatigue and exertional dyspnea, I will defer adding blood pressure medication at this time.  Chronic HFpEF: Meagan Wilcox appears euvolemic on examination today.  It is reasonable to continue furosemide 20 mg daily.  In light of progressive DOE and fatigue, we will repeat an echocardiogram.  Hyperlipidemia: Continue simvastatin 20 mg daily with ongoing management/follow-up per Dr. Theresia Lo.  Follow-up: Return to clinic in 2 months.  Juel Burrow, MD 12/31/2020 3:10 PM

## 2020-12-31 ENCOUNTER — Encounter: Payer: Self-pay | Admitting: Internal Medicine

## 2020-12-31 DIAGNOSIS — E782 Mixed hyperlipidemia: Secondary | ICD-10-CM | POA: Insufficient documentation

## 2020-12-31 DIAGNOSIS — E785 Hyperlipidemia, unspecified: Secondary | ICD-10-CM | POA: Insufficient documentation

## 2020-12-31 DIAGNOSIS — I5032 Chronic diastolic (congestive) heart failure: Secondary | ICD-10-CM | POA: Insufficient documentation

## 2020-12-31 DIAGNOSIS — I4819 Other persistent atrial fibrillation: Secondary | ICD-10-CM | POA: Insufficient documentation

## 2020-12-31 LAB — CBC
Hematocrit: 37.7 % (ref 34.0–46.6)
Hemoglobin: 12.7 g/dL (ref 11.1–15.9)
MCH: 31.1 pg (ref 26.6–33.0)
MCHC: 33.7 g/dL (ref 31.5–35.7)
MCV: 92 fL (ref 79–97)
Platelets: 209 10*3/uL (ref 150–450)
RBC: 4.09 x10E6/uL (ref 3.77–5.28)
RDW: 13.1 % (ref 11.7–15.4)
WBC: 6.1 10*3/uL (ref 3.4–10.8)

## 2020-12-31 LAB — COMPREHENSIVE METABOLIC PANEL
ALT: 12 IU/L (ref 0–32)
AST: 18 IU/L (ref 0–40)
Albumin/Globulin Ratio: 1.8 (ref 1.2–2.2)
Albumin: 4.7 g/dL — ABNORMAL HIGH (ref 3.6–4.6)
Alkaline Phosphatase: 69 IU/L (ref 44–121)
BUN/Creatinine Ratio: 19 (ref 12–28)
BUN: 20 mg/dL (ref 8–27)
Bilirubin Total: 0.2 mg/dL (ref 0.0–1.2)
CO2: 22 mmol/L (ref 20–29)
Calcium: 9.7 mg/dL (ref 8.7–10.3)
Chloride: 98 mmol/L (ref 96–106)
Creatinine, Ser: 1.07 mg/dL — ABNORMAL HIGH (ref 0.57–1.00)
Globulin, Total: 2.6 g/dL (ref 1.5–4.5)
Glucose: 102 mg/dL — ABNORMAL HIGH (ref 65–99)
Potassium: 4.1 mmol/L (ref 3.5–5.2)
Sodium: 137 mmol/L (ref 134–144)
Total Protein: 7.3 g/dL (ref 6.0–8.5)
eGFR: 50 mL/min/{1.73_m2} — ABNORMAL LOW (ref >59)

## 2020-12-31 LAB — TSH: TSH: 1.71 u[IU]/mL (ref 0.450–4.500)

## 2021-01-13 DIAGNOSIS — I480 Paroxysmal atrial fibrillation: Secondary | ICD-10-CM

## 2021-01-19 DIAGNOSIS — I480 Paroxysmal atrial fibrillation: Secondary | ICD-10-CM | POA: Diagnosis not present

## 2021-02-04 ENCOUNTER — Ambulatory Visit (INDEPENDENT_AMBULATORY_CARE_PROVIDER_SITE_OTHER): Payer: Medicare PPO

## 2021-02-04 ENCOUNTER — Other Ambulatory Visit: Payer: Self-pay

## 2021-02-04 DIAGNOSIS — I5032 Chronic diastolic (congestive) heart failure: Secondary | ICD-10-CM | POA: Diagnosis not present

## 2021-02-04 DIAGNOSIS — I4819 Other persistent atrial fibrillation: Secondary | ICD-10-CM | POA: Diagnosis not present

## 2021-02-04 LAB — ECHOCARDIOGRAM COMPLETE
AV Mean grad: 6 mmHg
AV Peak grad: 11 mmHg
Ao pk vel: 1.66 m/s
Area-P 1/2: 3.89 cm2
Calc EF: 63.7 %
Single Plane A2C EF: 66.2 %
Single Plane A4C EF: 64.6 %

## 2021-02-08 DIAGNOSIS — Z0100 Encounter for examination of eyes and vision without abnormal findings: Secondary | ICD-10-CM | POA: Diagnosis not present

## 2021-02-08 DIAGNOSIS — H35372 Puckering of macula, left eye: Secondary | ICD-10-CM | POA: Diagnosis not present

## 2021-02-09 ENCOUNTER — Telehealth: Payer: Self-pay | Admitting: *Deleted

## 2021-02-09 NOTE — Telephone Encounter (Signed)
-----   Message from Yvonne Kendall, MD sent at 02/04/2021  5:47 PM EDT ----- Please let Ms. Leitz know that her heart is contracting well but somewhat stiffened.  There is also elevated pressure in the right side of her heart and lungs, which could be contributing to some of her fatigue and shortness of breath.  I recommend that she continue her current medications and follow-up with [place] as scheduled to reassess her symptoms and determine if additional testing or medication changes are necessary

## 2021-02-09 NOTE — Telephone Encounter (Signed)
Attempted to call pt to discuss echo results.  No answer at this time. Lmtcb.

## 2021-02-12 NOTE — Telephone Encounter (Signed)
Pt has been notified of results per result note 02/10/21.

## 2021-03-01 ENCOUNTER — Other Ambulatory Visit: Payer: Self-pay | Admitting: Internal Medicine

## 2021-03-02 NOTE — Progress Notes (Signed)
Follow-up Outpatient Visit Date: 03/03/2021  Primary Care Provider: Corky Downs, MD [address] [place] [address]  Chief Complaint: Follow-up fatigue and leg swelling  HPI:  [name] is a 87 y.o. female with history of atrial fibrillation/flutter, hypertension, hyperlipidemia, hypothyroidism, GERD, and arthritis, who presents for follow-up of atrial fibrillation/flutter.  I last saw [name] in mid May, at which time she reported increased dyspnea and fatigue with activities.  She noted occasional slow heart rates, down into the mid 40s.  Event monitor showed predominantly sinus rhythm with an average rate of 55 bpm.  Occasional junctional rhythm and frequent PVCs were noted (PVC burden 5.1%).  Echo showed LVEF 55-60% with mild LVH and grade 2 diastolic dysfunction, mild RV enlargement with moderate pulmonary hypertension, mild mitral regurgitation, and mild to moderate tricuspid regurgitation.  Today, [name] reports feeling about the same as at our last visit.  She is still fatigued and slightly breathless.  She also notes mild swelling in her legs.  She is using furosemide a few days per week based on her leg swelling.  She notices occasional skipped beats but otherwise no frank palpitations.  She has not had any chest pain, or.  She denies falls as well as bleeding, remaining on apixaban.  She has experienced more frequent nausea and dizziness when riding in a car (this has been a longstanding problem but seems more pronounced).  Home blood pressure readings have been labile but overall mildly to moderately elevated, ranging from 148-173/62-79.  Heart rates have ranged from 42 to 67 bpm.  --------------------------------------------------------------------------------------------------  Past Medical History:  Diagnosis Date   Abnormal mammogram, unspecified 2011   Arthritis 2002   Chronic pain syndrome 08/01/2017   GERD (gastroesophageal reflux disease)    Heart murmur 1985    Hyperlipidemia    Hypertension 2005   Hypothyroidism    Persistent atrial fibrillation/Atypical atrial flutter (HCC)    a. 04/2019 TEE/DCCV; b. 05/2019 Recurrent Aflutter-->Amio-->DCCV; c. CHA2DS2VASc = 4-->Eliquis.   Past Surgical History:  Procedure Laterality Date   APPENDECTOMY  1939   BREAST BIOPSY Left 02-24-14   benign   BREAST BIOPSY Left 02/11/2016   neg   BREAST BIOPSY Left 2013   neg   BREAST EXCISIONAL BIOPSY Left 2014   neg   BREAST SURGERY Left 2013   stereotactic biopsy    BREAST SURGERY Left May 2010   1 mm foci of atypical ductal hyperplasia   CARDIOVERSION N/A 05/08/2019   Procedure: CARDIOVERSION;  Surgeon: Debbe Odea, MD;  Location: [facility];  Service: Cardiovascular;  Laterality: N/A;   CARDIOVERSION N/A 05/31/2019   Procedure: CARDIOVERSION;  Surgeon: Antonieta Iba, MD;  Location: [facility];  Service: Cardiovascular;  Laterality: N/A;   cataract surgery  Bilateral 2009   COLONOSCOPY  2010   Dr. Mechele Collin   left breast biopsy   2009   TEE WITHOUT CARDIOVERSION N/A 05/08/2019   Procedure: TRANSESOPHAGEAL ECHOCARDIOGRAM (TEE);  Surgeon: Debbe Odea, MD;  Location: [facility];  Service: Cardiovascular;  Laterality: N/A;    Current Meds  Medication Sig   amitriptyline (ELAVIL) 25 MG tablet TAKE 1 TABLET BY MOUTH DAILY   amLODipine (NORVASC) 2.5 MG tablet Take 1 tablet (2.5 mg total) by mouth daily.   apixaban (ELIQUIS) 5 MG TABS tablet TAKE 1 TABLET(5 MG) BY MOUTH TWICE DAILY   BIOTIN PO Take by mouth daily.   diazepam (VALIUM) 5 MG tablet Take 5 mg by mouth at bedtime as needed for anxiety.  ELMIRON 100 MG capsule TAKE 1 CAPSULE BY MOUTH THREE TIMES DAILY   furosemide (LASIX) 20 MG tablet Takes 20mg  tablet PO 2-3 days per week as needed   levothyroxine (SYNTHROID) 125 MCG tablet Take 1 tablet (125 mcg total) by mouth daily.   losartan-hydrochlorothiazide (HYZAAR) 100-12.5 MG tablet Take 1 tablet by mouth daily.   meclizine (ANTIVERT) 25 MG  tablet Take 25 mg by mouth as needed for dizziness.   Multiple Vitamin (MULTIVITAMIN) capsule Take 1 capsule by mouth daily.   naproxen sodium (ALEVE) 220 MG tablet Take 220 mg by mouth daily as needed.   omeprazole (PRILOSEC) 20 MG capsule TAKE 1 CAPSULE BY MOUTH DAILY   simvastatin (ZOCOR) 20 MG tablet TAKE 1 TABLET BY MOUTH DAILY   [DISCONTINUED] amiodarone (PACERONE) 100 MG tablet Take 1 tablet (100 mg total) by mouth daily.    Allergies: Amoxicillin and Penicillins  Social History   Tobacco Use   Smoking status: Never   Smokeless tobacco: Never  Vaping Use   Vaping Use: Never used  Substance Use Topics   Alcohol use: No    Alcohol/week: 0.0 standard drinks   Drug use: No    Family History  Problem Relation Age of Onset   Heart disease Mother    Heart disease Father    Diabetes Father    Stroke Father    Arthritis Father     Review of Systems: A 12-system review of systems was performed and was negative except as noted in the HPI.  --------------------------------------------------------------------------------------------------  Physical Exam: BP (!) 144/70 (BP Location: Right Arm, Patient Position: Sitting, Cuff Size: Normal)   Pulse (!) 55   Ht 5\' 4"  (1.626 m)   Wt 164 lb (74.4 kg)   SpO2 95%   BMI 28.15 kg/m   General:  NAD. Neck: No JVD or HJR. Lungs: Clear to auscultation bilaterally without wheezes or crackles. Heart: Bradycardic but regular with 1/6 systolic murmur. Abdomen: Soft, nontender, nondistended. Extremities: Trace ankle edema bilaterally.  EKG: Sinus bradycardia with LVH.  No significant change since 12/30/2020.  Lab Results  Component Value Date   WBC 6.1 12/30/2020   HGB 12.7 12/30/2020   HCT 37.7 12/30/2020   MCV 92 12/30/2020   PLT 209 12/30/2020    Lab Results  Component Value Date   NA 137 12/30/2020   K 4.1 12/30/2020   CL 98 12/30/2020   CO2 22 12/30/2020   BUN 20 12/30/2020   CREATININE 1.07 (H) 12/30/2020   GLUCOSE  102 (H) 12/30/2020   ALT 12 12/30/2020    No results found for: CHOL, HDL, LDLCALC, LDLDIRECT, TRIG, CHOLHDL  --------------------------------------------------------------------------------------------------  ASSESSMENT AND PLAN: Hypertension: Blood pressure mildly elevated today but typically higher at home.  We will continue current dose of losartan-HCTZ (100/12.5 mg daily) and add amlodipine 2.5 mg daily.  [name] can continue to use furosemide 20 mg daily as needed for edema.  Persistent atrial fibrillation, frequent PVCs, and bradycardia: [name] is maintaining sinus rhythm, with frequent bradycardia.  As she has not had any recurrent atrial fibrillation on the low-dose amiodarone but has nonspecific side effects such as fatigue and nausea/dizziness, we have agreed to discontinue amiodarone altogether.  She should remain on apixaban 5 mg twice daily for stroke prophylaxis.  If she were to have recurrent atrial fibrillation or more frequent symptomatic PVCs +/- development of cardiomyopathy, we would consider referral to EP to discuss other antiarrhythmic strategies.  We will continue to avoid other AV nodal blocking  agents.  Chronic HFpEF: [name] appears grossly euvolemic but has experienced occasional mild leg edema for which she is using as needed furosemide.  Recent echo showed preserved LVEF with moderate pulmonary hypertension.  We have agreed to continue as needed furosemide and work on blood pressure control.  If symptoms do not improve, we will need to consider further evaluation including RHC to better understand her hemodynamics.  Follow-up: Return to clinic in 1 month.  Yvonne Kendall, MD 03/04/2021 6:25 AM

## 2021-03-03 ENCOUNTER — Other Ambulatory Visit: Payer: Self-pay

## 2021-03-03 ENCOUNTER — Encounter: Payer: Self-pay | Admitting: Internal Medicine

## 2021-03-03 ENCOUNTER — Ambulatory Visit: Payer: Medicare PPO | Admitting: Internal Medicine

## 2021-03-03 VITALS — BP 144/70 | HR 55 | Ht 64.0 in | Wt 164.0 lb

## 2021-03-03 DIAGNOSIS — I10 Essential (primary) hypertension: Secondary | ICD-10-CM

## 2021-03-03 DIAGNOSIS — I5032 Chronic diastolic (congestive) heart failure: Secondary | ICD-10-CM | POA: Diagnosis not present

## 2021-03-03 DIAGNOSIS — I493 Ventricular premature depolarization: Secondary | ICD-10-CM | POA: Diagnosis not present

## 2021-03-03 DIAGNOSIS — R001 Bradycardia, unspecified: Secondary | ICD-10-CM

## 2021-03-03 DIAGNOSIS — I4892 Unspecified atrial flutter: Secondary | ICD-10-CM | POA: Diagnosis not present

## 2021-03-03 DIAGNOSIS — I4891 Unspecified atrial fibrillation: Secondary | ICD-10-CM | POA: Diagnosis not present

## 2021-03-03 MED ORDER — AMLODIPINE BESYLATE 2.5 MG PO TABS: 2.5000 mg | ORAL_TABLET | Freq: Every day | ORAL | 1 refills | Status: DC

## 2021-03-03 NOTE — Patient Instructions (Signed)
Medication Instructions:   Your physician has recommended you make the following change in your medication:   1) STOP Amiodarone  2) START Amlodipine 2.5mg  daily   3) TAKE Lasix (Furosemide) 20mg  DAILY x 2 weeks, THEN resume Lasix 20mg  AS NEEDED    *If you need a refill on your cardiac medications before your next appointment, please call your pharmacy*   Lab Work:  None ordered  Testing/Procedures:  None ordered   Follow-Up: At [facility], you and your health needs are our priority.  As part of our continuing mission to provide you with exceptional heart care, we have created designated Provider Care Teams.  These Care Teams include your primary Cardiologist (physician) and Advanced Practice Providers (APPs -  Physician Assistants and Nurse Practitioners) who all work together to provide you with the care you need, when you need it.  We recommend signing up for the patient portal called "MyChart".  Sign up information is provided on this After Visit Summary.  MyChart is used to connect with patients for Virtual Visits (Telemedicine).  Patients are able to view lab/test results, encounter notes, upcoming appointments, etc.  Non-urgent messages can be sent to your provider as well.   To learn more about what you can do with MyChart, go to .    Your next appointment:   1 month(s)  The format for your next appointment:   In Person  Provider:   You may see [facility], MD or one of the following Advanced Practice Providers on your designated Care Team:   [email or web address], NP Yvonne Kendall, PA-C Nicolasa Ducking, PA-C Cadence [place], Marisue Ivan

## 2021-03-04 ENCOUNTER — Encounter: Payer: Self-pay | Admitting: Internal Medicine

## 2021-03-04 DIAGNOSIS — I493 Ventricular premature depolarization: Secondary | ICD-10-CM | POA: Insufficient documentation

## 2021-03-04 DIAGNOSIS — R001 Bradycardia, unspecified: Secondary | ICD-10-CM | POA: Insufficient documentation

## 2021-03-09 ENCOUNTER — Other Ambulatory Visit: Payer: Self-pay | Admitting: Internal Medicine

## 2021-03-30 ENCOUNTER — Other Ambulatory Visit: Payer: Self-pay | Admitting: Internal Medicine

## 2021-04-07 ENCOUNTER — Ambulatory Visit: Payer: Medicare PPO | Admitting: Nurse Practitioner

## 2021-04-07 ENCOUNTER — Other Ambulatory Visit: Payer: Self-pay

## 2021-04-07 ENCOUNTER — Encounter: Payer: Self-pay | Admitting: Nurse Practitioner

## 2021-04-07 VITALS — BP 138/60 | HR 57 | Ht 64.0 in | Wt 161.0 lb

## 2021-04-07 DIAGNOSIS — I10 Essential (primary) hypertension: Secondary | ICD-10-CM | POA: Diagnosis not present

## 2021-04-07 DIAGNOSIS — R001 Bradycardia, unspecified: Secondary | ICD-10-CM

## 2021-04-07 DIAGNOSIS — I4891 Unspecified atrial fibrillation: Secondary | ICD-10-CM | POA: Diagnosis not present

## 2021-04-07 DIAGNOSIS — I5032 Chronic diastolic (congestive) heart failure: Secondary | ICD-10-CM | POA: Diagnosis not present

## 2021-04-07 DIAGNOSIS — I493 Ventricular premature depolarization: Secondary | ICD-10-CM | POA: Diagnosis not present

## 2021-04-07 DIAGNOSIS — I4892 Unspecified atrial flutter: Secondary | ICD-10-CM

## 2021-04-07 MED ORDER — AMLODIPINE BESYLATE 5 MG PO TABS: 5.0000 mg | ORAL_TABLET | Freq: Every day | ORAL | 3 refills | Status: DC

## 2021-04-07 NOTE — Progress Notes (Signed)
Office Visit    Patient Name: Meagan Wilcox Date of Encounter: 04/07/2021  Primary Care Provider:  Corky DownsMasoud, Javed, MD Primary Cardiologist:  Yvonne Kendallhristopher End, MD  Chief Complaint    87 year old female with a history of atrial fibrillation/flutter, hypertension, hyperlipidemia, hypothyroidism, GERD, HFpEF, and arthritis, who presents for follow-up related to fatigue and dyspnea on exertion.  Past Medical History    Past Medical History:  Diagnosis Date   (HFpEF) heart failure with preserved ejection fraction (HCC)    a. 01/2021 Echo: EF 55-60%, no rwma, GrII DD, nl RV fxn, mod elev PASP. Mild MR, mild-mod TR.   Abnormal mammogram, unspecified 2011   Arthritis 2002   Chronic pain syndrome 08/01/2017   GERD (gastroesophageal reflux disease)    Heart murmur 1985   Hyperlipidemia    Hypertension 2005   Hypothyroidism    Persistent atrial fibrillation/Atypical atrial flutter (HCC)    a. 04/2019 TEE/DCCV; b. 05/2019 Recurrent Aflutter-->Amio-->DCCV; c. CHA2DS2VASc = 4-->Eliquis; d. 01/2021 Zio: Avg HR 55 (40-90), 5.1% PVC burden. 10 atrial runs (longest 18 beats, fastest 126). No pauses or afib/flutter. Triggered events = RSR, PVCs, and jxnl.   Past Surgical History:  Procedure Laterality Date   APPENDECTOMY  1939   BREAST BIOPSY Left 02-24-14   benign   BREAST BIOPSY Left 02/11/2016   neg   BREAST BIOPSY Left 2013   neg   BREAST EXCISIONAL BIOPSY Left 2014   neg   BREAST SURGERY Left 2013   stereotactic biopsy    BREAST SURGERY Left May 2010   1 mm foci of atypical ductal hyperplasia   CARDIOVERSION N/A 05/08/2019   Procedure: CARDIOVERSION;  Surgeon: Debbe OdeaAgbor-Etang, Brian, MD;  Location: [facility];  Service: Cardiovascular;  Laterality: N/A;   CARDIOVERSION N/A 05/31/2019   Procedure: CARDIOVERSION;  Surgeon: Antonieta IbaGollan, Timothy J, MD;  Location: [facility];  Service: Cardiovascular;  Laterality: N/A;   cataract surgery  Bilateral 2009   COLONOSCOPY  2010   Dr. Mechele CollinElliott   left breast  biopsy   2009   TEE WITHOUT CARDIOVERSION N/A 05/08/2019   Procedure: TRANSESOPHAGEAL ECHOCARDIOGRAM (TEE);  Surgeon: Debbe OdeaAgbor-Etang, Brian, MD;  Location: [facility];  Service: Cardiovascular;  Laterality: N/A;    Allergies  Allergies  Allergen Reactions   Amoxicillin Diarrhea   Penicillins Rash    Did it involve swelling of the face/tongue/throat, SOB, or low BP? No Did it involve sudden or severe rash/hives, skin peeling, or any reaction on the inside of your mouth or nose? No Did you need to seek medical attention at a hospital or doctor's office? Yes When did it last happen?      65 years ago If all above answers are "NO", may proceed with cephalosporin use.    History of Present Illness    87 year old female with above past medical history including atrial fibrillation/flutter, hypertension, hyperlipidemia, HFpEF, hypothyroidism, GERD, and arthritis.  In September 2020, she was diagnosed with atrial fibrillation/atypical atrial flutter and underwent TEE guided cardioversion.  She had recurrent tachypalpitations in October 2020 and was found to be in atrial flutter with 2-1 block.  She was placed on amiodarone and underwent repeat cardioversion in mid October 2020.  Amiodarone dose was reduced to 200 mg daily in November 2020 in the setting of nausea and fatigue.  This was reduced further to 100 mg daily in the setting of nausea and fatigue in June 2021.  In May of this year, she was seen by Dr. Okey DupreEnd and reported some dyspnea  on exertion and fatigue, as well as bradycardia noted on home monitoring.  An event monitor was placed and showed an average heart rate of 55 bpm with a 5.1% PVC burden, 10 brief atrial runs, and no evidence of recurrent A. fib/flutter.  Triggered events were associate with sinus rhythm, PVCs, and junctional rhythm.  This was followed by an echocardiogram that showed an EF of 55 to 60% with grade 2 diastolic dysfunction, moderately elevated PASP, mild MR, and mild to moderate  TR.  At follow-up in July, she noted occasional skipped beats but otherwise no frank palpitations.  She reported more frequent nausea and dizziness, especially when riding in a car.  Given ongoing fatigue, nausea, and dizziness, decision was made to discontinue amiodarone altogether.  Amlodipine 2.5 mg daily was added for elevated blood pressure.  Since her last visit, she has noted a small, incremental improvement in degree of fatigue and dyspnea.  She notes that she still is not able to vacuum a room to completion secondary to dyspnea.  She does not experience chest pain.  She has not had any nausea or vomiting since coming off of amiodarone.  Further, she has not experienced any significant palpitations.  Her blood pressures have been trending mostly 140-160 since starting amlodipine 2.5 mg daily.  Heart rates have trended a little higher, in the mid 50s to 60s.  She denies PND, orthopnea, dizziness, syncope, edema, or early satiety.  She is using furosemide twice a week.  Home Medications    Current Outpatient Medications  Medication Sig Dispense Refill   amitriptyline (ELAVIL) 25 MG tablet TAKE 1 TABLET BY MOUTH DAILY 90 tablet 3   amLODipine (NORVASC) 5 MG tablet Take 1 tablet (5 mg total) by mouth daily. 30 tablet 3   apixaban (ELIQUIS) 5 MG TABS tablet TAKE 1 TABLET(5 MG) BY MOUTH TWICE DAILY 180 tablet 1   BIOTIN PO Take by mouth daily.     diazepam (VALIUM) 5 MG tablet Take 5 mg by mouth at bedtime as needed for anxiety.     ELMIRON 100 MG capsule TAKE 1 CAPSULE BY MOUTH THREE TIMES DAILY 90 capsule 3   furosemide (LASIX) 20 MG tablet Takes 20mg  tablet PO 2-3 days per week as needed     levothyroxine (SYNTHROID) 125 MCG tablet Take 1 tablet (125 mcg total) by mouth daily. 90 tablet 3   losartan-hydrochlorothiazide (HYZAAR) 100-12.5 MG tablet Take 1 tablet by mouth daily. 90 tablet 1   meclizine (ANTIVERT) 25 MG tablet Take 25 mg by mouth as needed for dizziness.     Multiple Vitamin  (MULTIVITAMIN) capsule Take 1 capsule by mouth daily.     naproxen sodium (ALEVE) 220 MG tablet Take 220 mg by mouth daily as needed.     omeprazole (PRILOSEC) 20 MG capsule TAKE 1 CAPSULE BY MOUTH DAILY 90 capsule 3   simvastatin (ZOCOR) 20 MG tablet TAKE 1 TABLET BY MOUTH DAILY 90 tablet 3   No current facility-administered medications for this visit.     Review of Systems    Feels that fatigue and dyspnea exertion are slightly improved since her last visit but still present.  She denies chest pain, palpitations, PND, orthopnea, dizziness, syncope, edema, or early satiety..  All other systems reviewed and are otherwise negative except as noted above.  Physical Exam    VS:  BP 138/60 (BP Location: Right Arm, Patient Position: Sitting, Cuff Size: Normal)   Pulse (!) 57   Ht 5\' 4"  (1.626 m)  Wt 161 lb (73 kg)   SpO2 95%   BMI 27.64 kg/m  , BMI Body mass index is 27.64 kg/m.     GEN: Well nourished, well developed, in no acute distress. HEENT: Very hard of hearing.  Otherwise normal. Neck: Supple, no JVD, carotid bruits, or masses. Cardiac: RRR, 1/6 systolic murmur at the left lower sternal border, no rubs, or gallops. No clubbing, cyanosis, edema.  Radials/PT 2+ and equal bilaterally.  Respiratory:  Respirations regular and unlabored, clear to auscultation bilaterally. GI: Soft, nontender, nondistended, BS + x 4. MS: no deformity or atrophy. Skin: warm and dry, no rash. Neuro:  Strength and sensation are intact. Psych: Normal affect.  Accessory Clinical Findings    ECG personally reviewed by me today -sinus bradycardia, 57, left axis deviation, LVH- no acute changes.  Lab Results  Component Value Date   WBC 6.1 12/30/2020   HGB 12.7 12/30/2020   HCT 37.7 12/30/2020   MCV 92 12/30/2020   PLT 209 12/30/2020   Lab Results  Component Value Date   CREATININE 1.07 (H) 12/30/2020   BUN 20 12/30/2020   NA 137 12/30/2020   K 4.1 12/30/2020   CL 98 12/30/2020   CO2 22  12/30/2020   Lab Results  Component Value Date   ALT 12 12/30/2020   AST 18 12/30/2020   ALKPHOS 69 12/30/2020   BILITOT 0.2 12/30/2020   Assessment & Plan    1.  Chronic HFpEF: EF 55 to 60% by echo in June, with grade 2 diastolic dysfunction.  Pulmonary pressure was moderately elevated.  She has some degree of chronic dyspnea on exertion and fatigue and this has improved only slightly since her last visit.  She is euvolemic on examination.  She has only been using furosemide twice a week.  Her niece is present with her today.  As previously noted, we will try and optimize her blood pressure management and allow for additional amiodarone washout, and reevaluate her symptoms in about 4 to 6 weeks.  At that point, if she has persistent dyspnea on exertion and fatigue, we can consider right heart catheterization.  We discussed this at length today and they are in agreement with deferring invasive evaluation as long as possible.  Given ongoing elevations in blood pressure, I will increase her amlodipine to 5 mg daily.  She otherwise remains on losartan-HCTZ.  2.  Paroxysmal atrial fibrillation/flutter: Now off of amiodarone in the setting of fatigue and nausea.  Zio monitoring in June showed a 5.1% PVC burden and a few brief runs of SVT.  These appear to be quiescent, she has not had any palpitations over the past month.  She is not on AV nodal blocking agent in the setting of baseline bradycardia.  She is anticoagulated with Eliquis.  She and her niece understand that if she were to have recurrent arrhythmias, we would refer to EP for alternate antiarrhythmic therapy.  3.  Essential hypertension: Blood pressure remains elevated following addition of amlodipine last month.  I will increase to 5 mg daily.  We did discuss that if she develops bothersome ankle edema on the higher dose, she is to notify us.  4.  Sinus bradycardia: Rates at home have improved into the mid 50s and 60s.  Heart rate 57 today.   Continue to avoid AV nodal blocking agents.  Asymptomatic.  5.  Hyperlipidemia: Followed by her primary care provider.  She is on moderate dose simvastatin therapy.  6.  Disposition: Follow-up in  clinic in 4 to 6 weeks or sooner if necessary.  Nicolasa Ducking, NP 04/07/2021, 12:02 PM

## 2021-04-07 NOTE — Patient Instructions (Addendum)
Medication Instructions:  - Your physician has recommended you make the following change in your medication:   1) INCREASE amlodipine to 5 mg- take 1 tablet by mouth once daily   - you may use up your current supply of amlodipine 2.5 mg by taking 2 tablets (5 mg) once daily until they are gone  *If you need a refill on your cardiac medications before your next appointment, please call your pharmacy*   Lab Work: - none ordered  If you have labs (blood work) drawn today and your tests are completely normal, you will receive your results only by: MyChart Message (if you have MyChart) OR A paper copy in the mail If you have any lab test that is abnormal or we need to change your treatment, we will call you to review the results.   Testing/Procedures: - none ordered   Follow-Up: At [facility], you and your health needs are our priority.  As part of our continuing mission to provide you with exceptional heart care, we have created designated Provider Care Teams.  These Care Teams include your primary Cardiologist (physician) and Advanced Practice Providers (APPs -  Physician Assistants and Nurse Practitioners) who all work together to provide you with the care you need, when you need it.  We recommend signing up for the patient portal called "MyChart".  Sign up information is provided on this After Visit Summary.  MyChart is used to connect with patients for Virtual Visits (Telemedicine).  Patients are able to view lab/test results, encounter notes, upcoming appointments, etc.  Non-urgent messages can be sent to your provider as well.   To learn more about what you can do with MyChart, go to [email or web address].    Your next appointment:   4-6 week(s)  The format for your next appointment:   In Person  Provider:   You may see Yvonne Kendall, MD or one of the following Advanced Practice Providers on your designated Care Team:   Nicolasa Ducking, NP    Other  Instructions N/a

## 2021-04-08 DIAGNOSIS — I4892 Unspecified atrial flutter: Secondary | ICD-10-CM | POA: Diagnosis not present

## 2021-04-08 DIAGNOSIS — E785 Hyperlipidemia, unspecified: Secondary | ICD-10-CM | POA: Diagnosis not present

## 2021-04-08 DIAGNOSIS — F324 Major depressive disorder, single episode, in partial remission: Secondary | ICD-10-CM | POA: Diagnosis not present

## 2021-04-08 DIAGNOSIS — D6869 Other thrombophilia: Secondary | ICD-10-CM | POA: Diagnosis not present

## 2021-04-08 DIAGNOSIS — F419 Anxiety disorder, unspecified: Secondary | ICD-10-CM | POA: Diagnosis not present

## 2021-04-08 DIAGNOSIS — E039 Hypothyroidism, unspecified: Secondary | ICD-10-CM | POA: Diagnosis not present

## 2021-04-08 DIAGNOSIS — I110 Hypertensive heart disease with heart failure: Secondary | ICD-10-CM | POA: Diagnosis not present

## 2021-04-08 DIAGNOSIS — I509 Heart failure, unspecified: Secondary | ICD-10-CM | POA: Diagnosis not present

## 2021-04-08 DIAGNOSIS — I4891 Unspecified atrial fibrillation: Secondary | ICD-10-CM | POA: Diagnosis not present

## 2021-04-20 ENCOUNTER — Telehealth: Payer: Self-pay | Admitting: Nurse Practitioner

## 2021-04-20 NOTE — Telephone Encounter (Signed)
Spoke with the pt.  She has been taking Amlodipine 2.5 mg without side effects since 03/03/21. Last office visit 04/07/21 Amlodipine was incr to 5 mg daily.  Pt states that 3-4 days after incr to 5 mg she noticed swelling, itching, redness and burning in both feet.  Sunday 9/4, pt decided to change back to 2.5 mg on her own and reports swelling, itching and redness subsided.  Pt does still report burning in feet while all other symptoms have resolved.  Pt did also take Lasix 3 times per week as needed per PRN instructions prior to swelling resolving.   Pt is continuing Amlodipine 2.5 mg daily as of now.  Advised pt to let us know of any other changes that occur.  Notified pt will make provider aware for further recc.

## 2021-04-20 NOTE — Telephone Encounter (Signed)
Pt c/o medication issue:  1. Name of Medication: amlodipine   2. How are you currently taking this medication (dosage and times per day)? 2.5 mg po q d   3. Are you having a reaction (difficulty breathing--STAT)? Swelling redness feet   4. What is your medication issue? Recent visit increased to 5 mg Patient states she cannot tolerate due to swelling itching tingling and redness in feet.    Patient changed dose back to 2.5 mg and has seen some improvement .  She also mentions she has taken lasix to help with swelling but understands to go back to prn when swelling subsides.  Patient wants provider to know of changes and verbalize if changes are ok.

## 2021-04-20 NOTE — Telephone Encounter (Signed)
Thank you for the update.  It's very likely that the increased dose of amlodipine is responsible for the excess swelling, though I would not have expected such a profound response with only going up to 5 mg.  She should continue 2.5mg  daily.  I'd like for her to check her bp a few x/wk and record.  We may still need to adjust her bp medicine, but only if she's trending high at home.

## 2021-04-21 NOTE — Telephone Encounter (Signed)
Spoke with the patient. Patient made aware of Ward Givens, NP response and recommendation. Patient is agreeable with the plan.  Adv the patient to bring her BP reading with her to her upcoming appt later this month. She is to contact the office sooner if assistance is needed. Patient voiced appreciation for the call.

## 2021-05-02 ENCOUNTER — Other Ambulatory Visit: Payer: Self-pay | Admitting: Internal Medicine

## 2021-05-13 ENCOUNTER — Other Ambulatory Visit: Payer: Self-pay

## 2021-05-13 ENCOUNTER — Ambulatory Visit: Payer: Medicare PPO | Admitting: Nurse Practitioner

## 2021-05-13 ENCOUNTER — Encounter: Payer: Self-pay | Admitting: Nurse Practitioner

## 2021-05-13 VITALS — BP 138/62 | HR 59 | Ht 64.0 in | Wt 162.0 lb

## 2021-05-13 DIAGNOSIS — I10 Essential (primary) hypertension: Secondary | ICD-10-CM

## 2021-05-13 DIAGNOSIS — I4891 Unspecified atrial fibrillation: Secondary | ICD-10-CM | POA: Diagnosis not present

## 2021-05-13 DIAGNOSIS — I5032 Chronic diastolic (congestive) heart failure: Secondary | ICD-10-CM

## 2021-05-13 DIAGNOSIS — I4892 Unspecified atrial flutter: Secondary | ICD-10-CM | POA: Diagnosis not present

## 2021-05-13 DIAGNOSIS — E782 Mixed hyperlipidemia: Secondary | ICD-10-CM

## 2021-05-13 NOTE — Progress Notes (Signed)
[facility]   Office Visit    Patient Name: Meagan Wilcox Date of Encounter: 05/13/2021  Primary Care Provider:  Corky Downs, MD Primary Cardiologist:  Meagan Kendall, MD  Chief Complaint    87 year old female with a history of atrial fibrillation/flutter, hypertension, hyperlipidemia, hypothyroidism, GERD, HFpEF, and arthritis, presents for follow-up of HFpEF.  Past Medical History    Past Medical History:  Diagnosis Date   (HFpEF) heart failure with preserved ejection fraction (HCC)    a. 01/2021 Echo: EF 55-60%, no rwma, GrII DD, nl RV fxn, mod elev PASP. Mild MR, mild-mod TR.   Abnormal mammogram, unspecified 2011   Arthritis 2002   Chronic pain syndrome 08/01/2017   GERD (gastroesophageal reflux disease)    Heart murmur 1985   Hyperlipidemia    Hypertension 2005   Hypothyroidism    Persistent atrial fibrillation/Atypical atrial flutter (HCC)    a. 04/2019 TEE/DCCV; b. 05/2019 Recurrent Aflutter-->Amio-->DCCV; c. CHA2DS2VASc = 4-->Eliquis; d. 01/2021 Zio: Avg HR 55 (40-90), 5.1% PVC burden. 10 atrial runs (longest 18 beats, fastest 126). No pauses or afib/flutter. Triggered events = RSR, PVCs, and jxnl.   Past Surgical History:  Procedure Laterality Date   APPENDECTOMY  1939   BREAST BIOPSY Left 02-24-14   benign   BREAST BIOPSY Left 02/11/2016   neg   BREAST BIOPSY Left 2013   neg   BREAST EXCISIONAL BIOPSY Left 2014   neg   BREAST SURGERY Left 2013   stereotactic biopsy    BREAST SURGERY Left May 2010   1 mm foci of atypical ductal hyperplasia   CARDIOVERSION N/A 05/08/2019   Procedure: CARDIOVERSION;  Surgeon: Meagan Odea, MD;  Location: [facility] OR[facility];  Service: Cardiovascular;  Laterality: N/A;   CARDIOVERSION N/A 05/31/2019   Procedure: CARDIOVERSION;  Surgeon: Meagan Iba, MD;  Location: [facility];  Service: Cardiovascular;  Laterality: N/A;   cataract surgery  Bilateral 2009   COLONOSCOPY  2010   Dr. Mechele Wilcox   left breast biopsy   2009   TEE WITHOUT  CARDIOVERSION N/A 05/08/2019   Procedure: TRANSESOPHAGEAL ECHOCARDIOGRAM (TEE);  Surgeon: Meagan Odea, MD;  Location: [facility] ORS;  Service: Cardiovascular;  Laterality: N/A;    Allergies  Allergies  Allergen Reactions   Amoxicillin Diarrhea   Penicillins Rash    Did it involve swelling of the face/tongue/throat, SOB, or low BP? No Did it involve sudden or severe rash/hives, skin peeling, or any reaction on the inside of your mouth or nose? No Did you need to seek medical attention at a hospital or doctor's office? Yes When did it last happen?      65 years ago If all above answers are "NO", may proceed with cephalosporin use.    History of Present Illness    87 year old female with the above past medical history including atrial fibrillation/flutter, hypertension, hyperlipidemia, HFpEF, hypothyroidism, GERD, and arthritis.  In September 2020, she was diagnosed with atrial fibrillation/atypical atrial flutter and underwent TEE guided cardioversion.  She had recurrent tachypalpitations October 2020 was found to be in atrial flutter with 2-1 conduction.  She was placed on amiodarone and underwent repeat cardioversion in mid October 2020.  Amiodarone dose was reduced to 200 mg daily November 2020 in the setting of nausea and fatigue.  This was reduced further to 100 mg daily setting of nausea and fatigue in June 2021.  In May of this year, she was seen by Dr. Okey Wilcox and reported dyspnea on exertion and fatigue as well as bradycardia noted on  home monitoring.  Event monitoring showed an average heart rate of 55 bpm with a 5.1% PVC burden, 10 brief atrial runs, and no evidence of recurrent A. fib/flutter.  Triggered events were associated with sinus rhythm, PVCs, and junctional rhythm.  This was followed by an echocardiogram that showed an EF of 55 to 60% with grade 2 diastolic dysfunction, moderately elevated PASP, mild MR, and mild to moderate TR.  At follow-up in July 2022, she reported more  frequent nausea and dizziness, especially while riding in a car.  Amiodarone was discontinued and amlodipine 2.5 mg daily was added for blood pressure.  Meagan Wilcox was last seen in cardiology clinic on August 24, at which time she reported a small, incremental improvement in degree of fatigue and dyspnea, though continued to note dyspnea on exertion.  Blood pressure remained elevated and recommendation was made for blood pressure optimization with potential future consideration for right heart catheterization for ongoing symptoms.  Amlodipine was increased to 5mg  daily but pt developed increased ankle and heel associated with redness and tingling, and the dose was scaled back to 2.5mg  daily.  Since then, she is not sure that those symptoms have improved much.  She notes that at the end of the day, sometimes her heels look red and they are tingling or burning.  She is not sure if she is developing neuropathy.  On 2.5 mg of amlodipine, her blood pressure has risen back to the 150 range.  She had been trending in the 130s and below on the 5 mg dose.  She would like to try the 5 mg again before considering an alternate agent.  She feels that her fatigue and dyspnea have improved significantly, now that she has been off of amiodarone for 2 months.  She has not any palpitations and is in sinus rhythm today.  She denies chest pain, PND, orthopnea, dizziness, syncope, or early satiety.  Home Medications    Current Outpatient Medications  Medication Sig Dispense Refill   amitriptyline (ELAVIL) 25 MG tablet TAKE 1 TABLET BY MOUTH DAILY 90 tablet 3   amLODipine (NORVASC) 5 MG tablet Take 1 tablet (5 mg total) by mouth daily. 30 tablet 3   apixaban (ELIQUIS) 5 MG TABS tablet TAKE 1 TABLET(5 MG) BY MOUTH TWICE DAILY 180 tablet 1   BIOTIN PO Take by mouth daily.     diazepam (VALIUM) 5 MG tablet Take 5 mg by mouth at bedtime as needed for anxiety.     ELMIRON 100 MG capsule TAKE 1 CAPSULE BY MOUTH THREE TIMES DAILY 90  capsule 3   furosemide (LASIX) 20 MG tablet Takes 20mg  tablet PO 2-3 days per week as needed     levothyroxine (SYNTHROID) 125 MCG tablet Take 1 tablet (125 mcg total) by mouth daily. 90 tablet 3   losartan-hydrochlorothiazide (HYZAAR) 100-12.5 MG tablet Take 1 tablet by mouth daily. 90 tablet 1   meclizine (ANTIVERT) 25 MG tablet Take 25 mg by mouth as needed for dizziness.     Multiple Vitamin (MULTIVITAMIN) capsule Take 1 capsule by mouth daily.     naproxen sodium (ALEVE) 220 MG tablet Take 220 mg by mouth daily as needed.     omeprazole (PRILOSEC) 20 MG capsule TAKE 1 CAPSULE BY MOUTH DAILY 90 capsule 3   simvastatin (ZOCOR) 20 MG tablet TAKE 1 TABLET BY MOUTH DAILY 90 tablet 3   No current facility-administered medications for this visit.     Review of Systems    Has had  some ankle swelling with redness and tingling-is not sure if it is related to amlodipine or not.  She has had significant improvement in fatigue and dyspnea.  She denies palpitations, chest pain, dizziness, syncope, or early satiety all other systems reviewed and are otherwise negative except as noted above.  Physical Exam    VS:  BP 138/62 (BP Location: Right Arm, Patient Position: Sitting, Cuff Size: Normal)   Pulse (!) 59   Ht 5\' 4"  (1.626 m)   Wt 162 lb (73.5 kg)   SpO2 97%   BMI 27.81 kg/m  , BMI Body mass index is 27.81 kg/m.     GEN: Well nourished, well developed, in no acute distress. HEENT: normal. Neck: Supple, no JVD, carotid bruits, or masses. Cardiac: RRR, 1/6 systolic murmur at the left lower sternal border, no rubs, or gallops. No clubbing, cyanosis, edema.  Radials/PT 2+ and equal bilaterally.  Respiratory:  Respirations regular and unlabored, clear to auscultation bilaterally. GI: Soft, nontender, nondistended, BS + x 4. MS: no deformity or atrophy. Skin: warm and dry, no rash. Neuro:  Strength and sensation are intact. Psych: Normal affect.  Accessory Clinical Findings    ECG  personally reviewed by me today -sinus bradycardia, 59, left axis deviation, LVH- no acute changes.  Lab Results  Component Value Date   WBC 6.1 12/30/2020   HGB 12.7 12/30/2020   HCT 37.7 12/30/2020   MCV 92 12/30/2020   PLT 209 12/30/2020   Lab Results  Component Value Date   CREATININE 1.07 (H) 12/30/2020   BUN 20 12/30/2020   NA 137 12/30/2020   K 4.1 12/30/2020   CL 98 12/30/2020   CO2 22 12/30/2020   Lab Results  Component Value Date   ALT 12 12/30/2020   AST 18 12/30/2020   ALKPHOS 69 12/30/2020   BILITOT 0.2 12/30/2020    Assessment & Plan    1.  Chronic HFpEF: EF 55 to 60% by echo in June 2022, with grade 2 diastolic dysfunction.  Pulmonary pressure was mildly elevated.  She has some degree of chronic dyspnea exertion and fatigue but notes that both symptoms have improved over the past month, specifically after stopping amiodarone.  Her weight is stable and she is euvolemic on examination today.  She did note some increase in swelling with redness and tingling in her heels and lower ankles after increasing amlodipine but now believes that may be this was neuropathic discomfort.  She would like to go back up on amlodipine to 5 mg to see if symptoms change again.  We discussed that if she has worsening swelling, I have a very low threshold to just discontinue amlodipine in favor of spironolactone however, at this time she would prefer to try again with amlodipine.  She otherwise remains on Lasix 20 mg, which she is only taking about once a week, and Hyzaar.  2.  Paroxysmal atrial fibrillation/flutter: Now off of amiodarone with significant improvement in fatigue and resolution of nausea.  Zio monitoring in June showed a 5.1% PVC burden and few/brief episodes of SVT.  She denies any palpitations.  She is bradycardic at 59 bpm and off of AV nodal blocking agents.  She remains anticoagulated with Eliquis 5 mg twice daily.  She understands that any recurrent arrhythmia would result  in EP evaluation.  3.  Essential hypertension: Blood pressure has been elevated at home in the 150s on amlodipine 2.5 mg daily.  As noted above, she would like to try 5 mg  again.  She will contact us in the next week or so if she has any worsening of lower extremity symptoms.  4.  Sinus bradycardia: Stable.  Continue to avoid AV nodal blocking agents.  Asymptomatic.  5.  Hyperlipidemia: Remains on moderate dose simvastatin therapy.  This is followed by primary care.  6.  Disposition: Patient or her family member will contact us in the next week or so for any recurrent lower extremity symptoms on amlodipine 5 mg daily.  Otherwise, plan to see her back in 2 to 3 months.   Nicolasa Ducking, NP 05/13/2021, 12:46 PM

## 2021-05-13 NOTE — Patient Instructions (Signed)
Medication Instructions:  Your physician has recommended you make the following change in your medication:   Take Amlodipine 5 mg for one week to see if this helps. If swelling increases please give [place] a call.   *If you need a refill on your cardiac medications before your next appointment, please call your pharmacy*   Lab Work: None  If you have labs (blood work) drawn today and your tests are completely normal, you will receive your results only by: MyChart Message (if you have MyChart) OR A paper copy in the mail If you have any lab test that is abnormal or we need to change your treatment, we will call you to review the results.   Testing/Procedures: None   Follow-Up: At [facility], you and your health needs are our priority.  As part of our continuing mission to provide you with exceptional heart care, we have created designated Provider Care Teams.  These Care Teams include your primary Cardiologist (physician) and Advanced Practice Providers (APPs -  Physician Assistants and Nurse Practitioners) who all work together to provide you with the care you need, when you need it.  We recommend signing up for the patient portal called "MyChart".  Sign up information is provided on this After Visit Summary.  MyChart is used to connect with patients for Virtual Visits (Telemedicine).  Patients are able to view lab/test results, encounter notes, upcoming appointments, etc.  Non-urgent messages can be sent to your provider as well.   To learn more about what you can do with MyChart, go to [email or web address].    Your next appointment:   2 month(s)  The format for your next appointment:   In Person  Provider:   Yvonne Kendall, MD or Nicolasa Ducking, NP

## 2021-05-23 ENCOUNTER — Other Ambulatory Visit: Payer: Self-pay | Admitting: Internal Medicine

## 2021-05-23 DIAGNOSIS — I480 Paroxysmal atrial fibrillation: Secondary | ICD-10-CM

## 2021-05-24 NOTE — Telephone Encounter (Signed)
Refill request

## 2021-05-24 NOTE — Telephone Encounter (Signed)
Eliquis 5 mg refill request received. Patient is 87 years old, weight-73.5 kg, Crea- 1.07 on 12/30/20, Diagnosis-afib, and last seen by Ward Givens, NP on 05/13/21. Dose is appropriate based on dosing criteria. Will send in refill to requested pharmacy.

## 2021-06-27 ENCOUNTER — Other Ambulatory Visit: Payer: Self-pay | Admitting: Internal Medicine

## 2021-07-14 ENCOUNTER — Other Ambulatory Visit: Payer: Self-pay

## 2021-07-14 ENCOUNTER — Encounter: Payer: Self-pay | Admitting: Nurse Practitioner

## 2021-07-14 ENCOUNTER — Ambulatory Visit: Payer: Medicare PPO | Admitting: Nurse Practitioner

## 2021-07-14 VITALS — BP 142/64 | HR 62 | Ht 64.0 in | Wt 162.0 lb

## 2021-07-14 DIAGNOSIS — I5032 Chronic diastolic (congestive) heart failure: Secondary | ICD-10-CM

## 2021-07-14 DIAGNOSIS — I10 Essential (primary) hypertension: Secondary | ICD-10-CM | POA: Diagnosis not present

## 2021-07-14 DIAGNOSIS — I4892 Unspecified atrial flutter: Secondary | ICD-10-CM

## 2021-07-14 DIAGNOSIS — I4891 Unspecified atrial fibrillation: Secondary | ICD-10-CM

## 2021-07-14 DIAGNOSIS — R001 Bradycardia, unspecified: Secondary | ICD-10-CM | POA: Diagnosis not present

## 2021-07-14 DIAGNOSIS — E782 Mixed hyperlipidemia: Secondary | ICD-10-CM

## 2021-07-14 MED ORDER — AMLODIPINE BESYLATE 10 MG PO TABS: 10.0000 mg | ORAL_TABLET | Freq: Every day | ORAL | 1 refills | Status: DC

## 2021-07-14 NOTE — Progress Notes (Signed)
Office Visit    Patient Name: Meagan Wilcox Date of Encounter: 07/14/2021  Primary Care Provider:  Cletis Athens, MD Primary Cardiologist:  Nelva Bush, MD  Chief Complaint    87 y/o ? w/ a h/o atrial fibrillation/flutter, hypertension, hyperlipidemia, hypothyroidism, GERD, HFpEF, and arthritis, who presents for follow-up of HFpEF.  Past Medical History    Past Medical History:  Diagnosis Date   (HFpEF) heart failure with preserved ejection fraction ([place])    a. 01/2021 Echo: EF 55-60%, no rwma, GrII DD, nl RV fxn, mod elev PASP. Mild MR, mild-mod TR.   Abnormal mammogram, unspecified 2011   Arthritis 2002   Chronic pain syndrome 08/01/2017   GERD (gastroesophageal reflux disease)    Heart murmur 1985   Hyperlipidemia    Hypertension 2005   Hypothyroidism    Persistent atrial fibrillation/Atypical atrial flutter ([place])    a. 04/2019 TEE/DCCV; b. 05/2019 Recurrent Aflutter-->Amio-->DCCV; c. CHA2DS2VASc = 4-->Eliquis; d. 01/2021 Zio: Avg HR 55 (40-90), 5.1% PVC burden. 10 atrial runs (longest 18 beats, fastest 126). No pauses or afib/flutter. Triggered events = RSR, PVCs, and jxnl.   Past Surgical History:  Procedure Laterality Date   APPENDECTOMY  1939   BREAST BIOPSY Left 02-24-14   benign   BREAST BIOPSY Left 02/11/2016   neg   BREAST BIOPSY Left 2013   neg   BREAST EXCISIONAL BIOPSY Left 2014   neg   BREAST SURGERY Left 2013   stereotactic biopsy    BREAST SURGERY Left May 2010   1 mm foci of atypical ductal hyperplasia   CARDIOVERSION N/A 05/08/2019   Procedure: CARDIOVERSION;  Surgeon: Kate Sable, MD;  Location: [facility];  Service: Cardiovascular;  Laterality: N/A;   CARDIOVERSION N/A 05/31/2019   Procedure: CARDIOVERSION;  Surgeon: Minna Merritts, MD;  Location: [facility];  Service: Cardiovascular;  Laterality: N/A;   cataract surgery  Bilateral 2009   COLONOSCOPY  2010   Dr. Vira Agar   left breast biopsy   2009   TEE WITHOUT CARDIOVERSION N/A  05/08/2019   Procedure: TRANSESOPHAGEAL ECHOCARDIOGRAM (TEE);  Surgeon: Kate Sable, MD;  Location: [facility];  Service: Cardiovascular;  Laterality: N/A;    Allergies  Allergies  Allergen Reactions   Amoxicillin Diarrhea   Penicillins Rash    Did it involve swelling of the face/tongue/throat, SOB, or low BP? No Did it involve sudden or severe rash/hives, skin peeling, or any reaction on the inside of your mouth or nose? No Did you need to seek medical attention at a hospital or doctor's office? Yes When did it last happen?      65 years ago If all above answers are "NO", may proceed with cephalosporin use.    History of Present Illness    87 year old female with the above past medical history including atrial fibrillation/flutter, hypertension, hyperlipidemia, HFpEF, hypothyroidism, GERD, and arthritis.  In September 2020, she was diagnosed with atrial fibrillation/atypical atrial flutter and underwent TEE guided cardioversion.  She had recurrent tachypalpitations in October 2020, and was found to be in atrial flutter with 2-1 conduction.  She was placed on amiodarone and underwent repeat cardioversion in mid October 2020.  Amiodarone dose was reduced to 200 mg daily in November 2020 in the setting of nausea and fatigue.  This was reduced further to 100 mg daily in the setting of nausea and fatigue in June 2021.  In May 2022, she was experience dyspnea on exertion as well as fatigue and bradycardia on home monitoring.  Formal event monitoring showed an average heart rate of 55 bpm with a 5.1% PVC burden, 10 brief atrial runs, and no evidence of recurrent atrial fibrillation or flutter.  Triggered events were associated with sinus rhythm and PVCs, as well as junctional rhythm.  Echocardiogram at that time showed an EF of 55 to 60% with grade 2 diastolic dysfunction, moderately elevated PASP, mild MR, and mild to moderate tricuspid regurgitation.  We will follow-up in July 2022, she reported  more frequent nausea and dizziness especially riding in a car.  Amiodarone was discontinued.  [name] was last seen in cardiology clinic on May 13, 2021, at which time she noted that blood pressures are trending in the 150s since we reduce her amlodipine from 5 mg to 2.5 mg, which was done due to ankle edema.  We discussed introducing alternate antihypertensive therapy however, she preferred to try 5 mg of amlodipine again.  Since her last visit, blood pressures have mostly trended in the 140s with occasional 130s and occasional 150s.  She has tolerated amlodipine well, and has not been having any significant lower extremity swelling.  Her lower legs remain somewhat tender but this has not changed any.  She continues to feel as though her strength is doing well and generally tolerates activity well without significant dyspnea or fatigue.  She has not had any palpitations.  There was 1 day recently where she had more gas than she was expecting, and felt somewhat harried and tired.  She denies chest pain, PND, orthopnea, dizziness, syncope, or early satiety.  Home Medications    Current Outpatient Medications  Medication Sig Dispense Refill   amitriptyline (ELAVIL) 25 MG tablet TAKE 1 TABLET BY MOUTH DAILY 90 tablet 3   BIOTIN PO Take by mouth daily.     ELIQUIS 5 MG TABS tablet TAKE 1 TABLET(5 MG) BY MOUTH TWICE DAILY 180 tablet 1   ELMIRON 100 MG capsule TAKE 1 CAPSULE BY MOUTH THREE TIMES DAILY 90 capsule 3   furosemide (LASIX) 20 MG tablet Takes 20mg  tablet PO 2-3 days per week as needed     levothyroxine (SYNTHROID) 125 MCG tablet TAKE 1 TABLET(125 MCG) BY MOUTH DAILY 90 tablet 3   losartan-hydrochlorothiazide (HYZAAR) 100-12.5 MG tablet Take 1 tablet by mouth daily. 90 tablet 1   Multiple Vitamin (MULTIVITAMIN) capsule Take 1 capsule by mouth daily.     naproxen sodium (ALEVE) 220 MG tablet Take 220 mg by mouth daily as needed.     omeprazole (PRILOSEC) 20 MG capsule TAKE 1 CAPSULE BY  MOUTH DAILY 90 capsule 3   simvastatin (ZOCOR) 20 MG tablet TAKE 1 TABLET BY MOUTH DAILY 90 tablet 3   amLODipine (NORVASC) 10 MG tablet Take 1 tablet (10 mg total) by mouth daily. 90 tablet 1   No current facility-administered medications for this visit.    Review of Systems    Overall doing well.  She denies chest pain, dyspnea, palpitations, PND, orthopnea, dizziness, syncope, edema, or early satiety..  All other systems reviewed and are otherwise negative except as noted above.   Physical Exam    VS:  BP (!) 142/64 (BP Location: Right Arm, Patient Position: Sitting, Cuff Size: Normal)   Pulse 62   Ht 5\' 4"  (1.626 m)   Wt 162 lb (73.5 kg)   SpO2 98%   BMI 27.81 kg/m  , BMI Body mass index is 27.81 kg/m.    *Blood pressure trends at home consistently trending in the 140s. GEN: Well  nourished, well developed, in no acute distress. HEENT: normal. Neck: Supple, no JVD, carotid bruits, or masses. Cardiac: RRR, 1/6 systolic murmur heard throughout, no rubs, or gallops. No clubbing, cyanosis, edema.  Radials/PT 2+ and equal bilaterally.  Respiratory:  Respirations regular and unlabored, clear to auscultation bilaterally. GI: Soft, nontender, nondistended, BS + x 4. MS: no deformity or atrophy. Skin: warm and dry, no rash. Neuro:  Strength and sensation are intact. Psych: Normal affect.  Accessory Clinical Findings    ECG personally reviewed by me today -regular sinus rhythm, 62, left axis deviation, LVH- no acute changes.  Lab Results  Component Value Date   WBC 6.1 12/30/2020   HGB 12.7 12/30/2020   HCT 37.7 12/30/2020   MCV 92 12/30/2020   PLT 209 12/30/2020   Lab Results  Component Value Date   CREATININE 1.07 (H) 12/30/2020   BUN 20 12/30/2020   NA 137 12/30/2020   K 4.1 12/30/2020   CL 98 12/30/2020   CO2 22 12/30/2020   Lab Results  Component Value Date   ALT 12 12/30/2020   AST 18 12/30/2020   ALKPHOS 69 12/30/2020   BILITOT 0.2 12/30/2020    Assessment  & Plan    1.  Essential hypertension: Blood pressure remains elevated at home with trends in the 140s.  She is tolerating amlodipine 5 mg daily.  Her niece is present with her today and we discussed options for ongoing management of blood pressure.  We agreed to try amlodipine 10 mg daily.  If for some reason, she does not tolerate this, can reconsider increasing losartan-HCTZ to 100-25 mg daily.  2.  Chronic HFpEF: EF 55 to 60% by echo in June 2022 with grade 2 diastolic dysfunction.  Pulmonary pressures mildly elevated.  She has some degree of chronic dyspnea on exertion but as noted in September, this is improved significantly since stopping amiodarone.  Weight has been stable and she is euvolemic on examination.  Increasing amlodipine in the setting of ongoing hypertension as outlined above.  She uses Lasix sparingly.  3.  Paroxysmal atrial fibrillation/flutter: Continues to do well without palpitations following discontinuation of amiodarone earlier this year.  Heart rate stable at 62 bpm today.  She is anticoagulated with Eliquis 5 mg twice daily.  She understands that any recurrent arrhythmias would result in EP evaluation.  4.  Sinus bradycardia: Heart rate 62.  She is on no AV nodal blocking agents.  5.  Hyperlipidemia: Remains on moderate dose simvastatin therapy.  This is followed by her primary care provider.  6.  Disposition: Patient keeps excellent records of her blood pressure at home.  We agreed that patient and her niece will call us in the next 2 to 4 weeks if pressures continue to trend high.  Otherwise, can consider in person versus telephonic/virtual visit in 3 months.   Nicolasa Ducking, NP 07/14/2021, 12:29 PM

## 2021-07-14 NOTE — Patient Instructions (Signed)
Medication Instructions:   Your physician has recommended you make the following change in your medication:   INCREASE your Amlodipine to 10 MG once a day.  *If you need a refill on your cardiac medications before your next appointment, please call your pharmacy*   Lab Work:  None ordered   Testing/Procedures:  None ordered   Follow-Up: At [facility], you and your health needs are our priority.  As part of our continuing mission to provide you with exceptional heart care, we have created designated Provider Care Teams.  These Care Teams include your primary Cardiologist (physician) and Advanced Practice Providers (APPs -  Physician Assistants and Nurse Practitioners) who all work together to provide you with the care you need, when you need it.  We recommend signing up for the patient portal called "MyChart".  Sign up information is provided on this After Visit Summary.  MyChart is used to connect with patients for Virtual Visits (Telemedicine).  Patients are able to view lab/test results, encounter notes, upcoming appointments, etc.  Non-urgent messages can be sent to your provider as well.   To learn more about what you can do with MyChart, go to [email or web address].    Your next appointment:   3 month(s)  The format for your next appointment:   Virtual Visit   Provider:   You may see Yvonne Kendall, MD or one of the following Advanced Practice Providers on your designated Care Team:   Nicolasa Ducking, NP    Other Instructions

## 2021-07-28 DIAGNOSIS — M170 Bilateral primary osteoarthritis of knee: Secondary | ICD-10-CM | POA: Diagnosis not present

## 2021-08-03 ENCOUNTER — Other Ambulatory Visit: Payer: Self-pay | Admitting: Internal Medicine

## 2021-08-23 ENCOUNTER — Inpatient Hospital Stay
Admission: EM | Admit: 2021-08-23 | Discharge: 2021-08-31 | DRG: 309 | Disposition: A | Discharge status: Skilled Nursing Facility | Payer: Medicare PPO | Attending: Internal Medicine | Admitting: Internal Medicine

## 2021-08-23 ENCOUNTER — Telehealth: Payer: Self-pay | Admitting: Internal Medicine

## 2021-08-23 ENCOUNTER — Ambulatory Visit: Payer: Medicare PPO | Admitting: Cardiovascular Disease

## 2021-08-23 ENCOUNTER — Other Ambulatory Visit: Payer: Self-pay

## 2021-08-23 ENCOUNTER — Emergency Department: Payer: Medicare PPO

## 2021-08-23 DIAGNOSIS — N1831 Chronic kidney disease, stage 3a: Secondary | ICD-10-CM | POA: Diagnosis not present

## 2021-08-23 DIAGNOSIS — Z7901 Long term (current) use of anticoagulants: Secondary | ICD-10-CM | POA: Diagnosis not present

## 2021-08-23 DIAGNOSIS — R000 Tachycardia, unspecified: Secondary | ICD-10-CM | POA: Diagnosis not present

## 2021-08-23 DIAGNOSIS — I5032 Chronic diastolic (congestive) heart failure: Secondary | ICD-10-CM | POA: Diagnosis present

## 2021-08-23 DIAGNOSIS — Z79899 Other long term (current) drug therapy: Secondary | ICD-10-CM

## 2021-08-23 DIAGNOSIS — E039 Hypothyroidism, unspecified: Secondary | ICD-10-CM | POA: Diagnosis present

## 2021-08-23 DIAGNOSIS — I484 Atypical atrial flutter: Secondary | ICD-10-CM | POA: Diagnosis present

## 2021-08-23 DIAGNOSIS — I480 Paroxysmal atrial fibrillation: Secondary | ICD-10-CM | POA: Diagnosis not present

## 2021-08-23 DIAGNOSIS — R001 Bradycardia, unspecified: Secondary | ICD-10-CM | POA: Diagnosis not present

## 2021-08-23 DIAGNOSIS — E785 Hyperlipidemia, unspecified: Secondary | ICD-10-CM | POA: Diagnosis not present

## 2021-08-23 DIAGNOSIS — Z8261 Family history of arthritis: Secondary | ICD-10-CM | POA: Diagnosis not present

## 2021-08-23 DIAGNOSIS — I441 Atrioventricular block, second degree: Secondary | ICD-10-CM | POA: Diagnosis not present

## 2021-08-23 DIAGNOSIS — K219 Gastro-esophageal reflux disease without esophagitis: Secondary | ICD-10-CM | POA: Diagnosis present

## 2021-08-23 DIAGNOSIS — R531 Weakness: Secondary | ICD-10-CM | POA: Diagnosis not present

## 2021-08-23 DIAGNOSIS — I959 Hypotension, unspecified: Secondary | ICD-10-CM | POA: Diagnosis not present

## 2021-08-23 DIAGNOSIS — Z79890 Hormone replacement therapy (postmenopausal): Secondary | ICD-10-CM | POA: Diagnosis not present

## 2021-08-23 DIAGNOSIS — J449 Chronic obstructive pulmonary disease, unspecified: Secondary | ICD-10-CM | POA: Diagnosis not present

## 2021-08-23 DIAGNOSIS — I130 Hypertensive heart and chronic kidney disease with heart failure and stage 1 through stage 4 chronic kidney disease, or unspecified chronic kidney disease: Secondary | ICD-10-CM | POA: Diagnosis present

## 2021-08-23 DIAGNOSIS — Z833 Family history of diabetes mellitus: Secondary | ICD-10-CM

## 2021-08-23 DIAGNOSIS — M1990 Unspecified osteoarthritis, unspecified site: Secondary | ICD-10-CM | POA: Diagnosis present

## 2021-08-23 DIAGNOSIS — I4892 Unspecified atrial flutter: Secondary | ICD-10-CM | POA: Diagnosis present

## 2021-08-23 DIAGNOSIS — I110 Hypertensive heart disease with heart failure: Secondary | ICD-10-CM | POA: Diagnosis not present

## 2021-08-23 DIAGNOSIS — Z8249 Family history of ischemic heart disease and other diseases of the circulatory system: Secondary | ICD-10-CM | POA: Diagnosis not present

## 2021-08-23 DIAGNOSIS — Z823 Family history of stroke: Secondary | ICD-10-CM

## 2021-08-23 DIAGNOSIS — R1111 Vomiting without nausea: Secondary | ICD-10-CM | POA: Diagnosis not present

## 2021-08-23 DIAGNOSIS — I4891 Unspecified atrial fibrillation: Principal | ICD-10-CM

## 2021-08-23 DIAGNOSIS — N3010 Interstitial cystitis (chronic) without hematuria: Secondary | ICD-10-CM | POA: Diagnosis present

## 2021-08-23 DIAGNOSIS — R0602 Shortness of breath: Secondary | ICD-10-CM | POA: Diagnosis not present

## 2021-08-23 DIAGNOSIS — R0902 Hypoxemia: Secondary | ICD-10-CM | POA: Diagnosis not present

## 2021-08-23 DIAGNOSIS — F418 Other specified anxiety disorders: Secondary | ICD-10-CM | POA: Diagnosis not present

## 2021-08-23 DIAGNOSIS — M5136 Other intervertebral disc degeneration, lumbar region: Secondary | ICD-10-CM | POA: Diagnosis not present

## 2021-08-23 DIAGNOSIS — I4819 Other persistent atrial fibrillation: Secondary | ICD-10-CM | POA: Diagnosis not present

## 2021-08-23 DIAGNOSIS — Z880 Allergy status to penicillin: Secondary | ICD-10-CM

## 2021-08-23 DIAGNOSIS — R011 Cardiac murmur, unspecified: Secondary | ICD-10-CM | POA: Diagnosis not present

## 2021-08-23 DIAGNOSIS — I5030 Unspecified diastolic (congestive) heart failure: Secondary | ICD-10-CM | POA: Diagnosis not present

## 2021-08-23 DIAGNOSIS — I10 Essential (primary) hypertension: Secondary | ICD-10-CM | POA: Diagnosis not present

## 2021-08-23 DIAGNOSIS — R110 Nausea: Secondary | ICD-10-CM | POA: Diagnosis not present

## 2021-08-23 DIAGNOSIS — Z20822 Contact with and (suspected) exposure to covid-19: Secondary | ICD-10-CM | POA: Diagnosis not present

## 2021-08-23 DIAGNOSIS — G894 Chronic pain syndrome: Secondary | ICD-10-CM | POA: Diagnosis present

## 2021-08-23 DIAGNOSIS — Z743 Need for continuous supervision: Secondary | ICD-10-CM | POA: Diagnosis not present

## 2021-08-23 LAB — CBC
HCT: 39.3 % (ref 36.0–46.0)
Hemoglobin: 13.4 g/dL (ref 12.0–15.0)
MCH: 31.3 pg (ref 26.0–34.0)
MCHC: 34.1 g/dL (ref 30.0–36.0)
MCV: 91.8 fL (ref 80.0–100.0)
Platelets: 257 10*3/uL (ref 150–400)
RBC: 4.28 MIL/uL (ref 3.87–5.11)
RDW: 13.6 % (ref 11.5–15.5)
WBC: 7.2 10*3/uL (ref 4.0–10.5)
nRBC: 0 % (ref 0.0–0.2)

## 2021-08-23 LAB — BRAIN NATRIURETIC PEPTIDE: B Natriuretic Peptide: 292.7 pg/mL — ABNORMAL HIGH (ref 0.0–100.0)

## 2021-08-23 LAB — TROPONIN I (HIGH SENSITIVITY)
Troponin I (High Sensitivity): 37 ng/L — ABNORMAL HIGH (ref <18)
Troponin I (High Sensitivity): 49 ng/L — ABNORMAL HIGH (ref <18)

## 2021-08-23 LAB — RESP PANEL BY RT-PCR (FLU A&B, COVID) ARPGX2
Influenza A by PCR: NEGATIVE
Influenza B by PCR: NEGATIVE
SARS Coronavirus 2 by RT PCR: NEGATIVE

## 2021-08-23 LAB — BASIC METABOLIC PANEL
Anion gap: 11 (ref 5–15)
BUN: 16 mg/dL (ref 8–23)
CO2: 23 mmol/L (ref 22–32)
Calcium: 9.8 mg/dL (ref 8.9–10.3)
Chloride: 101 mmol/L (ref 98–111)
Creatinine, Ser: 1.16 mg/dL — ABNORMAL HIGH (ref 0.44–1.00)
GFR, Estimated: 45 mL/min — ABNORMAL LOW (ref >60)
Glucose, Bld: 116 mg/dL — ABNORMAL HIGH (ref 70–99)
Potassium: 3.6 mmol/L (ref 3.5–5.1)
Sodium: 135 mmol/L (ref 135–145)

## 2021-08-23 LAB — TSH: TSH: 1.361 u[IU]/mL (ref 0.350–4.500)

## 2021-08-23 LAB — MAGNESIUM: Magnesium: 2.1 mg/dL (ref 1.7–2.4)

## 2021-08-23 MED ORDER — HYDROCHLOROTHIAZIDE 12.5 MG PO TABS
12.5000 mg | ORAL_TABLET | Freq: Every day | ORAL | Status: DC
  Administered 2021-08-24 – 2021-08-31 (×7): 12.5 mg via ORAL
  Filled 2021-08-23 (×7): qty 1

## 2021-08-23 MED ORDER — APIXABAN 5 MG PO TABS
5.0000 mg | ORAL_TABLET | Freq: Two times a day (BID) | ORAL | Status: DC
  Administered 2021-08-23 – 2021-08-31 (×15): 5 mg via ORAL
  Filled 2021-08-23 (×15): qty 1

## 2021-08-23 MED ORDER — PANTOPRAZOLE SODIUM 40 MG PO TBEC
40.0000 mg | DELAYED_RELEASE_TABLET | Freq: Every day | ORAL | Status: DC
  Administered 2021-08-24 – 2021-08-31 (×7): 40 mg via ORAL
  Filled 2021-08-23 (×7): qty 1

## 2021-08-23 MED ORDER — ONDANSETRON HCL 4 MG/2ML IJ SOLN
4.0000 mg | Freq: Four times a day (QID) | INTRAMUSCULAR | Status: DC | PRN
  Administered 2021-08-28: 4 mg via INTRAVENOUS
  Filled 2021-08-23: qty 2

## 2021-08-23 MED ORDER — SIMVASTATIN 20 MG PO TABS
20.0000 mg | ORAL_TABLET | Freq: Every day | ORAL | Status: DC
  Administered 2021-08-23 – 2021-08-30 (×8): 20 mg via ORAL
  Filled 2021-08-23 (×2): qty 1
  Filled 2021-08-23: qty 2
  Filled 2021-08-23 (×4): qty 1
  Filled 2021-08-23: qty 2

## 2021-08-23 MED ORDER — ADULT MULTIVITAMIN W/MINERALS CH
1.0000 | ORAL_TABLET | Freq: Every day | ORAL | Status: DC
  Administered 2021-08-24 – 2021-08-31 (×7): 1 via ORAL
  Filled 2021-08-23 (×7): qty 1

## 2021-08-23 MED ORDER — LEVOTHYROXINE SODIUM 25 MCG PO TABS
125.0000 ug | ORAL_TABLET | Freq: Every day | ORAL | Status: DC
  Administered 2021-08-24 – 2021-08-31 (×7): 125 ug via ORAL
  Filled 2021-08-23: qty 3
  Filled 2021-08-23 (×6): qty 1

## 2021-08-23 MED ORDER — LOSARTAN POTASSIUM 50 MG PO TABS
100.0000 mg | ORAL_TABLET | Freq: Every day | ORAL | Status: DC
  Administered 2021-08-24 – 2021-08-31 (×7): 100 mg via ORAL
  Filled 2021-08-23 (×7): qty 2

## 2021-08-23 MED ORDER — ACETAMINOPHEN 325 MG PO TABS
650.0000 mg | ORAL_TABLET | ORAL | Status: DC | PRN
  Administered 2021-08-23 – 2021-08-29 (×2): 650 mg via ORAL
  Filled 2021-08-23 (×2): qty 2

## 2021-08-23 MED ORDER — PENTOSAN POLYSULFATE SODIUM 100 MG PO CAPS
100.0000 mg | ORAL_CAPSULE | Freq: Three times a day (TID) | ORAL | Status: DC
  Administered 2021-08-23 – 2021-08-31 (×22): 100 mg via ORAL
  Filled 2021-08-23 (×27): qty 1

## 2021-08-23 MED ORDER — TRAZODONE HCL 50 MG PO TABS
25.0000 mg | ORAL_TABLET | Freq: Every evening | ORAL | Status: DC | PRN
  Administered 2021-08-23 – 2021-08-24 (×2): 25 mg via ORAL
  Filled 2021-08-23 (×2): qty 1

## 2021-08-23 MED ORDER — MAGNESIUM HYDROXIDE 400 MG/5ML PO SUSP: 30.0000 mL | Freq: Every day | ORAL | Status: DC | PRN

## 2021-08-23 MED ORDER — FUROSEMIDE 10 MG/ML IJ SOLN
20.0000 mg | Freq: Every day | INTRAMUSCULAR | Status: DC
  Administered 2021-08-23 – 2021-08-24 (×2): 20 mg via INTRAVENOUS
  Filled 2021-08-23 (×2): qty 4

## 2021-08-23 MED ORDER — POTASSIUM CHLORIDE 20 MEQ PO PACK
40.0000 meq | PACK | Freq: Once | ORAL | Status: AC
  Administered 2021-08-23: 40 meq via ORAL
  Filled 2021-08-23: qty 2

## 2021-08-23 MED ORDER — ASPIRIN 325 MG PO TABS
325.0000 mg | ORAL_TABLET | Freq: Every day | ORAL | Status: DC
  Administered 2021-08-23: 325 mg via ORAL
  Filled 2021-08-23 (×2): qty 1

## 2021-08-23 MED ORDER — DILTIAZEM HCL-DEXTROSE 125-5 MG/125ML-% IV SOLN (PREMIX)
5.0000 mg/h | INTRAVENOUS | Status: AC
  Administered 2021-08-23: 16:00:00 5 mg/h via INTRAVENOUS
  Administered 2021-08-24: 12.5 mg/h via INTRAVENOUS
  Administered 2021-08-24: 5 mg/h via INTRAVENOUS
  Filled 2021-08-23 (×3): qty 125

## 2021-08-23 MED ORDER — FUROSEMIDE 40 MG PO TABS: 20.0000 mg | ORAL_TABLET | Freq: Every day | ORAL | Status: DC

## 2021-08-23 MED ORDER — LOSARTAN POTASSIUM-HCTZ 100-12.5 MG PO TABS: 1.0000 | ORAL_TABLET | Freq: Every day | ORAL | Status: DC

## 2021-08-23 MED ORDER — DILTIAZEM HCL 25 MG/5ML IV SOLN: 10.0000 mg | Freq: Once | INTRAVENOUS | Status: DC

## 2021-08-23 MED ORDER — LORAZEPAM 0.5 MG PO TABS
0.5000 mg | ORAL_TABLET | Freq: Once | ORAL | Status: AC
Start: 2021-08-23 — End: 2021-08-23
  Administered 2021-08-23: 0.5 mg via ORAL
  Filled 2021-08-23: qty 1

## 2021-08-23 NOTE — ED Notes (Signed)
Pt friend calling on call light to ask about dinner. Had already called dietary in front of them to ask for dinner tray and dietary had said dinner tray on way. Provided applesauce to pt.

## 2021-08-23 NOTE — ED Notes (Signed)
Will give overdue aspirin tab once available.

## 2021-08-23 NOTE — ED Notes (Signed)
Pt unable to use purewick. Assisted pt to walk to toilet to void.

## 2021-08-23 NOTE — ED Notes (Signed)
Sent med message for missing El[place]iron.

## 2021-08-23 NOTE — ED Provider Triage Note (Signed)
Emergency Medicine Provider Triage Evaluation Note  Meagan Wilcox , a 88 y.o. female  was evaluated in triage.  Pt complains of shortness of breath and increased weakness since Saturday.Marland KitchenHistory of A-fib and on Eliquis.  Review of Systems  Positive: Weakness, shortness of breath Negative: Chest pain  Physical Exam  BP 134/80    Pulse (!) 132    Temp 97.7 F (36.5 C) (Oral)    Resp 20    SpO2 98%  Gen:   Awake, no distress   Resp:  Normal effort  MSK:   Moves extremities without difficulty  Other:    Medical Decision Making  Medically screening exam initiated at 11:52 AM.  Appropriate orders placed.  Meagan Wilcox was informed that the remainder of the evaluation will be completed by another provider, this initial triage assessment does not replace that evaluation, and the importance of remaining in the ED until their evaluation is complete.   Chinita Pester, FNP 08/23/21 1213

## 2021-08-23 NOTE — H&P (Signed)
[place]   PATIENT NAME: Meagan Wilcox    MR#:  [id number]  DATE OF BIRTH:  Mar 10, 1931  DATE OF ADMISSION:  08/23/2021  PRIMARY CARE PHYSICIAN: Valerie Roys, DO   Patient is coming from: Home  REQUESTING/REFERRING PHYSICIAN: Duffy Bruce, MD  CHIEF COMPLAINT:   Chief Complaint  Patient presents with   Shortness of Breath    HISTORY OF PRESENT ILLNESS:  Meagan Wilcox is a 88 y.o. Caucasian female with medical history significant for HFpAF, paroxysmal atrial fibrillation on Eliquis, GERD, hypertension, dyslipidemia and hypothyroidism, who presented to the ER with acute onset of generalized fatigue and dyspnea with worsening dyspnea over the last several days and associated palpitations.  She denies any chest pain or nausea or vomiting or diaphoresis.  Her heart rate has been elevated at home in the 110s.  No cough or wheezing or hemoptysis.  No leg pain or edema or recent travels or surgeries.  No dysuria, oliguria or hematuria or flank pain.  No bleeding diathesis.  She was taken off amiodarone on 03/03/2021 by Dr. Saunders Revel given frequent PVCs and bradycardia.  She denies any orthopnea or paroxysmal nocturnal dyspnea.  She has been having mild dyspnea on exertion.  ED Course: When she came to the ER heart rate was 132 with otherwise normal vital signs.  Labs revealed a creatinine of 1.16 and high-sensitivity troponin I of 37 and later 49.  BNP was 292.7 with a CBC that was normal.  Respiratory panel is currently pending. EKG as reviewed by me : EKG showed atrial fibrillation with rapid ventricular response of 116 with left axis deviation Imaging: 2 view chest x-ray showed COPD changes with no acute cardiopulmonary disease.  The patient was given 325 mg p.o. aspirin as well as IV Cardizem drip with improving heart rate to 106.  She will be admitted to a progressive unit bed for further evaluation and management. PAST MEDICAL HISTORY:   Past Medical History:  Diagnosis Date    (HFpEF) heart failure with preserved ejection fraction ([place])    a. 01/2021 Echo: EF 55-60%, no rwma, GrII DD, nl RV fxn, mod elev PASP. Mild MR, mild-mod TR.   Abnormal mammogram, unspecified 2011   Arthritis 2002   Chronic pain syndrome 08/01/2017   GERD (gastroesophageal reflux disease)    Heart murmur 1985   Hyperlipidemia    Hypertension 2005   Hypothyroidism    Persistent atrial fibrillation/Atypical atrial flutter ([place])    a. 04/2019 TEE/DCCV; b. 05/2019 Recurrent Aflutter-->Amio-->DCCV; c. CHA2DS2VASc = 4-->Eliquis; d. 01/2021 Zio: Avg HR 55 (40-90), 5.1% PVC burden. 10 atrial runs (longest 18 beats, fastest 126). No pauses or afib/flutter. Triggered events = RSR, PVCs, and jxnl.    PAST SURGICAL HISTORY:   Past Surgical History:  Procedure Laterality Date   APPENDECTOMY  1939   BREAST BIOPSY Left 02-24-14   benign   BREAST BIOPSY Left 02/11/2016   neg   BREAST BIOPSY Left 2013   neg   BREAST EXCISIONAL BIOPSY Left 2014   neg   BREAST SURGERY Left 2013   stereotactic biopsy    BREAST SURGERY Left May 2010   1 mm foci of atypical ductal hyperplasia   CARDIOVERSION N/A 05/08/2019   Procedure: CARDIOVERSION;  Surgeon: Kate Sable, MD;  Location: [facility] ORS;  Service: Cardiovascular;  Laterality: N/A;   CARDIOVERSION N/A 05/31/2019   Procedure: CARDIOVERSION;  Surgeon: Minna Merritts, MD;  Location: [facility] ORS;  Service: Cardiovascular;  Laterality: N/A;  cataract surgery  Bilateral 2009   COLONOSCOPY  2010   Dr. Vira Agar   left breast biopsy   2009   TEE WITHOUT CARDIOVERSION N/A 05/08/2019   Procedure: TRANSESOPHAGEAL ECHOCARDIOGRAM (TEE);  Surgeon: Kate Sable, MD;  Location: [facility] ORS;  Service: Cardiovascular;  Laterality: N/A;    SOCIAL HISTORY:   Social History   Tobacco Use   Smoking status: Never   Smokeless tobacco: Never  Substance Use Topics   Alcohol use: No    Alcohol/week: 0.0 standard drinks    FAMILY HISTORY:   Family History   Problem Relation Age of Onset   Heart disease Mother    Heart disease Father    Diabetes Father    Stroke Father    Arthritis Father     DRUG ALLERGIES:   Allergies  Allergen Reactions   Amoxicillin Diarrhea   Penicillins Rash    Did it involve swelling of the face/tongue/throat, SOB, or low BP? No Did it involve sudden or severe rash/hives, skin peeling, or any reaction on the inside of your mouth or nose? No Did you need to seek medical attention at a hospital or doctor's office? Yes When did it last happen?      65 years ago If all above answers are NO, may proceed with cephalosporin use.    REVIEW OF SYSTEMS:   ROS As per history of present illness. All pertinent systems were reviewed above. Constitutional, HEENT, cardiovascular, respiratory, GI, GU, musculoskeletal, neuro, psychiatric, endocrine, integumentary and hematologic systems were reviewed and are otherwise negative/unremarkable except for positive findings mentioned above in the HPI.   MEDICATIONS AT HOME:   Prior to Admission medications   Medication Sig Start Date End Date Taking? Authorizing Provider  acetaminophen (TYLENOL) 325 MG tablet Take 325 mg by mouth every 6 (six) hours as needed for mild pain, fever, headache or moderate pain.   Yes [provider]  amitriptyline (ELAVIL) 25 MG tablet TAKE 1 TABLET BY MOUTH DAILY 12/22/20  Yes Masoud, Viann Shove, MD  amLODipine (NORVASC) 2.5 MG tablet Take 2.5 mg by mouth daily. 05/01/21  Yes [provider]  BIOTIN PO Take 1 tablet by mouth 3 times daily with meals, bedtime and 2 AM.   Yes [provider]  ELIQUIS 5 MG TABS tablet TAKE 1 TABLET(5 MG) BY MOUTH TWICE DAILY 05/24/21  Yes Theora Gianotti, NP  ELMIRON 100 MG capsule TAKE 1 CAPSULE BY MOUTH THREE TIMES DAILY 03/10/21  Yes Masoud, Viann Shove, MD  furosemide (LASIX) 20 MG tablet Takes 20mg  tablet PO 2-3 days per week as needed   Yes [provider]  levothyroxine  (SYNTHROID) 125 MCG tablet TAKE 1 TABLET(125 MCG) BY MOUTH DAILY 06/27/21  Yes Masoud, Viann Shove, MD  losartan-hydrochlorothiazide (HYZAAR) 100-12.5 MG tablet TAKE 1 TABLET BY MOUTH DAILY 08/03/21  Yes End, Harrell Gave, MD  Multiple Vitamin (MULTIVITAMIN) capsule Take 1 capsule by mouth daily.   Yes [provider]  naproxen sodium (ALEVE) 220 MG tablet Take 220 mg by mouth daily as needed.   Yes [provider]  omeprazole (PRILOSEC) 20 MG capsule TAKE 1 CAPSULE BY MOUTH DAILY 03/01/21  Yes Masoud, Viann Shove, MD  simvastatin (ZOCOR) 20 MG tablet TAKE 1 TABLET BY MOUTH DAILY 11/16/20  Yes Masoud, Viann Shove, MD  amLODipine (NORVASC) 10 MG tablet Take 1 tablet (10 mg total) by mouth daily. Patient not taking: Reported on 08/23/2021 07/14/21   Theora Gianotti, NP      VITAL SIGNS:  Blood pressure Marland Kitchen)  150/65, pulse 87, temperature 97.7 F (36.5 C), temperature source Oral, resp. rate 15, SpO2 99 %.  PHYSICAL EXAMINATION:  Physical Exam  GENERAL:  88 y.o.-year-old Caucasian female patient lying in the bed with no acute distress.  EYES: Pupils equal, round, reactive to light and accommodation. No scleral icterus. Extraocular muscles intact.  HEENT: Head atraumatic, normocephalic. Oropharynx and nasopharynx clear.  NECK:  Supple, no jugular venous distention. No thyroid enlargement, no tenderness.  LUNGS: Normal breath sounds bilaterally, no wheezing, rales,rhonchi or crepitation. No use of accessory muscles of respiration.  CARDIOVASCULAR: Irregularly irregular tachycardic rhythm,, S1, S2 normal. No murmurs, rubs, or gallops.  ABDOMEN: Soft, nondistended, nontender. Bowel sounds present. No organomegaly or mass.  EXTREMITIES: No pedal edema, cyanosis, or clubbing.  NEUROLOGIC: Cranial nerves II through XII are intact. Muscle strength 5/5 in all extremities. Sensation intact. Gait not checked.  PSYCHIATRIC: The patient is alert and oriented x 3.  Normal affect and good eye  contact. SKIN: No obvious rash, lesion, or ulcer.   LABORATORY PANEL:   CBC Recent Labs  Lab 08/23/21 1145  WBC 7.2  HGB 13.4  HCT 39.3  PLT 257   ------------------------------------------------------------------------------------------------------------------  Chemistries  Recent Labs  Lab 08/23/21 1145  NA 135  K 3.6  CL 101  CO2 23  GLUCOSE 116*  BUN 16  CREATININE 1.16*  CALCIUM 9.8   ------------------------------------------------------------------------------------------------------------------  Cardiac Enzymes No results for input(s): TROPONINI in the last 168 hours. ------------------------------------------------------------------------------------------------------------------  RADIOLOGY:  DG Chest 2 View  Result Date: 08/23/2021 CLINICAL DATA:  Shortness of breath EXAM: CHEST - 2 VIEW COMPARISON:  07/01/2020 FINDINGS: Cardiac size is unremarkable. Increase in AP diameter of chest suggests COPD. There are no signs of pulmonary edema or focal pulmonary consolidation. There is no pleural effusion or pneumothorax. IMPRESSION: COPD. There are no signs of pulmonary edema or focal pulmonary consolidation. Electronically Signed   By: Elmer Picker M.D.   On: 08/23/2021 12:27      IMPRESSION AND PLAN:  Principal Problem:   Atrial fibrillation with rapid ventricular response ([place])  1.  Atrial fibrillation with rapid ventricular response.  She has elevated BNP likely secondary to her atrial fibrillation with RVR.  Her HFpEF is clinically compensated. - The patient will be admitted to a progressive unit bed. - We will continue her on IV Cardizem drip. - We will continue Eliquis. - Cardiology consult will be obtained. - I notified Dr. Fletcher Anon about the patient. - We will continue Lasix and IV form at her same home dose.  2.  Essential hypertension. - The patient will be on IV Cardizem drip and therefore Norvasc was held off. - We will continue her  Hyzaar.  3.  Hypothyroidism. - We will continue Synthroid and check TSH level.  4.  Dyslipidemia. - We will continue statin therapy.  5.  History of interstitial cystitis. - We will continue Elmiron.  DVT prophylaxis: Eliquis. Code Status: full code. Family Communication:  The plan of care was discussed in details with the patient (and family). I answered all questions. The patient agreed to proceed with the above mentioned plan. Further management will depend upon hospital course. Disposition Plan: Back to previous home environment Consults called: Cardiology.    All the records are reviewed and case discussed with ED provider.  Status is: Inpatient   At the time of the admission, it appears that the appropriate admission status for this patient is inpatient.  This is judged to be reasonable and necessary in  order to provide the required intensity of service to ensure the patient's safety given the presenting symptoms, physical exam findings and initial radiographic and laboratory data in the context of comorbid conditions.  The patient requires inpatient status due to high intensity of service, high risk of further deterioration and high frequency of surveillance required.  I certify that at the time of admission, it is my clinical judgment that the patient will require inpatient hospital care extending more than 2 midnights.                            Dispo: The patient is from: Home              Anticipated d/c is to: Home              Patient currently is not medically stable to d/c.              Difficult to place patient: No    Christel Mormon M.D on 08/23/2021 at 5:00 PM  [facility]   From 7 PM-7 AM, contact night-coverage [email or web address]  CC: Primary care physician; Valerie Roys, DO

## 2021-08-23 NOTE — ED Notes (Signed)
Dinner tray now at bedside.  Urine sent to lab in case UA ordered.

## 2021-08-23 NOTE — Telephone Encounter (Signed)
STAT if HR is under 50 or over 120 (normal HR is 60-100 beats per minute)  What is your heart rate? 119  Do you have a log of your heart rate readings (document readings)?   Do you have any other symptoms? Sob, headaches  Pt c/o Shortness Of Breath: STAT if SOB developed within the last 24 hours or pt is noticeably SOB on the phone  1. Are you currently SOB (can you hear that pt is SOB on the phone)? yes  2. How long have you been experiencing SOB? Saturday  3. Are you SOB when sitting or when up moving around? both  4. Are you currently experiencing any other symptoms? Dont feel well

## 2021-08-23 NOTE — ED Notes (Signed)
EDP at bedside. Pt to ED for weakness and SOB since Saturday. Hx a fib. States she has "just not been feeling well" and feels weak.

## 2021-08-23 NOTE — ED Notes (Signed)
Pt continues to hyperventilate. Re-positioning, deep breathing and attempting to walk to restroom unsuccessful to help pt. NP notified, orders received.

## 2021-08-23 NOTE — Telephone Encounter (Signed)
Call transferred directly to this RN from scheduling team. The patient reports SOB/ weakness/ elevated heart rate since Saturday night.  HR this morning is 117 bpm. BP this morning is 153/87.  The patient is audibly SOB over the phone, but also sounds a bit tearful as well.  It is hard to discern if the patient is struggling more with SOB or is very anxious at this time.  She advised her symptoms of weakness/ SOB have worsened since Saturday night.  The patient denies syncope or feelings of pre-syncope.  She states she is currently alone, but a friend, who is a nurse is on her way to be with the patient.  I advised the patient that just listening to her over the phone, I would recommend evaluation in the ER. The patient advised she has inquired about the ER wait at this time and it is significant. I have advised her that she should call 911 to have them assess her in the home and take her to the ER if needed.  The patient advised she wanted an appointment in the office, which I advised I did not have available until 1:40 pm today with Dr. Mariah Milling (DOD).  I inquired if the patient felt she could wait that long and she advised she thought she could. She will have her friend assess her when she arrives.  I have advised the patient I will put her on the schedule at 1:40 pm today, but if her symptoms worsen in any way she should call 911/ report to the ER for further evaluation and possible treatment.  The patient voices understanding and is agreeable.   I have confirmed with the patient she is currently taking her eliquis and has had amlodipine this morning.  She has a history of a-fib, but is not an any rhythm or rate controlling medications.  Sinus rates appear to run upper 50s-low 60s.

## 2021-08-23 NOTE — ED Triage Notes (Signed)
Pt comes with c/o SOb and increased weakness since Saturday. Pt states dx with afib last year and no meds currently. Pt states increased SOB and she just feels so weakn. Pt is on thinners.  EMS reports Afib of 120-160. 97 % RA, CBG-179 And 4 of zofran given.

## 2021-08-23 NOTE — ED Notes (Signed)
Called lab to send covid swabs (none here). Messaged pharmacy for missing aspirin dose.

## 2021-08-23 NOTE — ED Notes (Signed)
HR is 130-150 now on monitor. EDP at bedside. Pt denies chest pain.

## 2021-08-23 NOTE — ED Provider Notes (Signed)
[facility] Provider Note    Event Date/Time   First MD Initiated Contact with Patient 08/23/21 1404     (approximate)   History   Shortness of Breath   HPI  Meagan Wilcox is a 88 y.o. female with past medical history of HFpEF, atrial fibrillation on Eliquis, hypertension who presents with fatigue and dyspnea.  Patient notes that she has had more shortness of breath over the last several days.  Has been very fatigued as well with palpitations.  She denies frank chest pain.  Patient notes that she has been checking her heart rate at home and has been elevated in the 110s.  She denies nausea vomiting abdominal pain fevers or chills.  No cough congestion  Reviewed patient's last cardiology visit with Dr. Okey Dupre on 03/03/2021, it was noted that she has A. fib with frequent PVCs and bradycardia at that time was taken off amiodarone and there was discussion of avoiding AV nodal blocking agents because of her frequent bradycardia.    Past Medical History:  Diagnosis Date   (HFpEF) heart failure with preserved ejection fraction ([facility])    a. 01/2021 Echo: EF 55-60%, no rwma, GrII DD, nl RV fxn, mod elev PASP. Mild MR, mild-mod TR.   Abnormal mammogram, unspecified 2011   Arthritis 2002   Chronic pain syndrome 08/01/2017   GERD (gastroesophageal reflux disease)    Heart murmur 1985   Hyperlipidemia    Hypertension 2005   Hypothyroidism    Persistent atrial fibrillation/Atypical atrial flutter ([facility])    a. 04/2019 TEE/DCCV; b. 05/2019 Recurrent Aflutter-->Amio-->DCCV; c. CHA2DS2VASc = 4-->Eliquis; d. 01/2021 Zio: Avg HR 55 (40-90), 5.1% PVC burden. 10 atrial runs (longest 18 beats, fastest 126). No pauses or afib/flutter. Triggered events = RSR, PVCs, and jxnl.    Patient Active Problem List   Diagnosis Date Noted   PVC's (premature ventricular contractions) 03/04/2021   Bradycardia 03/04/2021   Persistent atrial fibrillation ([facility]) 12/31/2020   Chronic heart failure  with preserved ejection fraction (HFpEF) ([facility]) 12/31/2020   Hyperlipidemia 12/31/2020   Long term current use of amiodarone 07/02/2020   Annual physical exam 06/25/2020   Anxiety 03/24/2020   Fatigue 01/30/2020   Nausea 01/30/2020   Dyspnea on exertion 01/30/2020   Varicose veins of leg with edema, bilateral 12/23/2019   Atrial fibrillation and flutter (HCC) 05/07/2019   Lumbar radiculopathy 08/01/2017   Neuroforaminal stenosis of lumbar spine 08/01/2017   Lumbar degenerative disc disease 08/01/2017   Atypical ductal hyperplasia of left breast 08/31/2016   Essential hypertension      Physical Exam  Triage Vital Signs: ED Triage Vitals  Enc Vitals Group     BP 08/23/21 1142 134/80     Pulse Rate 08/23/21 1142 (!) 132     Resp 08/23/21 1142 20     Temp 08/23/21 1142 97.7 F (36.5 C)     Temp Source 08/23/21 1142 Oral     SpO2 08/23/21 1142 98 %     Weight --      Height --      Head Circumference --      Peak Flow --      Pain Score 08/23/21 1143 0     Pain Loc --      Pain Edu? --      Excl. in GC? --     Most recent vital signs: Vitals:   08/23/21 1142 08/23/21 1439  BP: 134/80 (!) 162/81  Pulse: (!) 132 95  Resp: 20 20  Temp: 97.7 F (36.5 C)   SpO2: 98% 100%     General: Awake, no distress.  CV:  Good peripheral perfusion.  No peripheral edema Resp:  Normal effort.  There are Abd:  No distention.  Neuro:             Awake, Alert, Oriented x 3  Other:     ED Results / Procedures / Treatments  Labs (all labs ordered are listed, but only abnormal results are displayed) Labs Reviewed  BASIC METABOLIC PANEL - Abnormal; Notable for the following components:      Result Value   Glucose, Bld 116 (*)    Creatinine, Ser 1.16 (*)    GFR, Estimated 45 (*)    All other components within normal limits  BRAIN NATRIURETIC PEPTIDE - Abnormal; Notable for the following components:   B Natriuretic Peptide 292.7 (*)    All other components within normal limits   TROPONIN I (HIGH SENSITIVITY) - Abnormal; Notable for the following components:   Troponin I (High Sensitivity) 37 (*)    All other components within normal limits  RESP PANEL BY RT-PCR (FLU A&B, COVID) ARPGX2  CBC  TROPONIN I (HIGH SENSITIVITY)     EKG  Atrial fibrillation with rapid ventricular response, rates in the 120s, normal axis, no acute ischemic changes   RADIOLOGY I reviewed the CXR which does not show any acute cardiopulmonary process; agree with radiology report     PROCEDURES:  Critical Care performed: Yes, see critical care procedure note(s)  .Critical Care Performed by: Georga Hacking, MD Authorized by: Georga Hacking, MD   Critical care provider statement:    Critical care time (minutes):  30   Critical care was time spent personally by me on the following activities:  Development of treatment plan with patient or surrogate, discussions with consultants, evaluation of patient's response to treatment, examination of patient, ordering and review of laboratory studies, ordering and review of radiographic studies, ordering and performing treatments and interventions, pulse oximetry, re-evaluation of patient's condition and review of old charts .1-3 Lead EKG Interpretation Performed by: Georga Hacking, MD Authorized by: Georga Hacking, MD     Interpretation: abnormal     ECG rate assessment: tachycardic     Rhythm: atrial fibrillation     Conduction: normal    The patient is on the cardiac monitor to evaluate for evidence of arrhythmia and/or significant heart rate changes.   MEDICATIONS ORDERED IN ED: Medications  diltiazem (CARDIZEM) 125 mg in dextrose 5% 125 mL (1 mg/mL) infusion (has no administration in time range)  aspirin tablet 325 mg (has no administration in time range)     IMPRESSION / MDM / ASSESSMENT AND PLAN / ED COURSE  I reviewed the triage vital signs and the nursing notes.                              Differential  diagnosis includes, but is not limited to, A. fib with RVR, CHF exacerbation, metabolic abnormality  The patient is a 87 year old female presenting with A. fib with RVR and generalized weakness.  Patient has been on amiodarone and rate control agents in the past but had issues with bradycardia and side effects of amiodarone thus she has not been on anything but Eliquis for the A. fib.  Notes that she has had more shortness of breath over the last several months but specifically  over the last several days and her heart rates have been elevated at home.  She denies any lower extremity edema or other symptoms no chest pain but does have some dyspnea.  She looks well on exam does not appear volume overloaded.  Her heart rate in the 120s.  EKG shows A. fib with RVR.  Her chest x-ray does not have any pulmonary edema, BNP is mildly elevated as is troponin.  Suspect that the troponin is in the setting of her arrhythmia.  She does not have any other ischemic symptoms.  Discussed with Dr. Kirke CorinArida, given the bradycardia she does in the past, and he recommends a low-dose diltiazem drip and hopefully she will convert on her own and they can then make a determination of what to start her on as an outpatient.  Will admit to the hospitalist service.   FINAL CLINICAL IMPRESSION(S) / ED DIAGNOSES   Final diagnoses:  Atrial fibrillation with RVR (HCC)     Rx / DC Orders   ED Discharge Orders     None        Note:  This document was prepared using Dragon voice recognition software and may include unintentional dictation errors.   Georga HackingMcHugh, Chey Rachels Rose, MD 08/23/21 [phone]

## 2021-08-24 ENCOUNTER — Other Ambulatory Visit: Payer: Self-pay

## 2021-08-24 DIAGNOSIS — I5030 Unspecified diastolic (congestive) heart failure: Secondary | ICD-10-CM

## 2021-08-24 DIAGNOSIS — I10 Essential (primary) hypertension: Secondary | ICD-10-CM

## 2021-08-24 DIAGNOSIS — E039 Hypothyroidism, unspecified: Secondary | ICD-10-CM | POA: Insufficient documentation

## 2021-08-24 DIAGNOSIS — I4892 Unspecified atrial flutter: Secondary | ICD-10-CM

## 2021-08-24 DIAGNOSIS — I4891 Unspecified atrial fibrillation: Secondary | ICD-10-CM

## 2021-08-24 LAB — BASIC METABOLIC PANEL
Anion gap: 9 (ref 5–15)
BUN: 19 mg/dL (ref 8–23)
CO2: 27 mmol/L (ref 22–32)
Calcium: 9.5 mg/dL (ref 8.9–10.3)
Chloride: 99 mmol/L (ref 98–111)
Creatinine, Ser: 1.13 mg/dL — ABNORMAL HIGH (ref 0.44–1.00)
GFR, Estimated: 47 mL/min — ABNORMAL LOW (ref >60)
Glucose, Bld: 121 mg/dL — ABNORMAL HIGH (ref 70–99)
Potassium: 4.5 mmol/L (ref 3.5–5.1)
Sodium: 135 mmol/L (ref 135–145)

## 2021-08-24 LAB — CBC
HCT: 38.7 % (ref 36.0–46.0)
Hemoglobin: 13.2 g/dL (ref 12.0–15.0)
MCH: 31.4 pg (ref 26.0–34.0)
MCHC: 34.1 g/dL (ref 30.0–36.0)
MCV: 92.1 fL (ref 80.0–100.0)
Platelets: 263 10*3/uL (ref 150–400)
RBC: 4.2 MIL/uL (ref 3.87–5.11)
RDW: 13.8 % (ref 11.5–15.5)
WBC: 6.7 10*3/uL (ref 4.0–10.5)
nRBC: 0 % (ref 0.0–0.2)

## 2021-08-24 LAB — D-DIMER, QUANTITATIVE: D-Dimer, Quant: <0.27 ug/mL-FEU (ref 0.00–0.50)

## 2021-08-24 MED ORDER — LORAZEPAM 0.5 MG PO TABS
0.5000 mg | ORAL_TABLET | Freq: Once | ORAL | Status: AC
  Administered 2021-08-25: 0.5 mg via ORAL
  Filled 2021-08-24: qty 1

## 2021-08-24 MED ORDER — FLECAINIDE ACETATE 100 MG PO TABS
50.0000 mg | ORAL_TABLET | Freq: Two times a day (BID) | ORAL | Status: DC
  Administered 2021-08-24 – 2021-08-31 (×13): 50 mg via ORAL
  Filled 2021-08-24: qty 0.5
  Filled 2021-08-24: qty 1
  Filled 2021-08-24: qty 0.5
  Filled 2021-08-24 (×5): qty 1
  Filled 2021-08-24 (×2): qty 0.5
  Filled 2021-08-24 (×2): qty 1
  Filled 2021-08-24 (×5): qty 0.5

## 2021-08-24 NOTE — ED Notes (Signed)
Pt repositioned, warm blanket given, pt resting comfortably

## 2021-08-24 NOTE — Consult Note (Signed)
Cardiology Consultation:   Patient ID: Meagan Wilcox; [id number]; 1930/12/01   Admit date: 08/23/2021 Date of Consult: 08/24/2021  Primary Care Provider: Pcp, No Primary Cardiologist: End Primary Electrophysiologist:  None   Patient Profile:   Meagan Wilcox is a 88 y.o. female with a hx of Afib/flutter on Eliquis s/p prior DCCV x 2 intolerant to amiodarone, HFpEF, HTN, HLD, hypothyroidism, arthritis, and GERD who is being seen today for the evaluation of Afib at the request of Dr. Sidney Ace.  History of Present Illness:   Meagan Wilcox was diagnosed with A. fib/atypical flutter in 04/2019 and underwent TEE guided DCCV at that time.  She had recurrent tachypalpitations in 05/2019 and was noted to be in atrial flutter with 2 1 AV block.  She was placed on amiodarone and underwent repeat DCCV in mid 05/2019.  In the setting of intolerance with associated nausea and fatigue amiodarone was ultimately discontinued.  More recently, in the spring 2022 she began to experience exertional dyspnea, fatigue, and bradycardia on home monitoring.  Subsequent event monitor demonstrated an average heart rate of 55 bpm with a 5.1% PVC burden, 10 brief atrial runs, and no evidence of recurrent A. fib/flutter.  Triggered events were associated with sinus rhythm with PVCs as well as a junctional rhythm.  Echo at that time showed an EF of 55 to 123456, grade 2 diastolic dysfunction, moderately elevated PASP, mild mitral vegetation, and mildly to moderately regurgitant tricuspid valve.  She was most recently seen in the office on 07/14/2021 and was maintaining sinus rhythm at that time.  She was admitted to [facility] on 08/23/2021 with a several day history of increased shortness of breath and generalized fatigue with intermittent palpitations.  Initially, she contacted our office and appointment was made for that afternoon, however due to symptoms that she presented to the ED where she was noted to be in atrial flutter with RVR with  ventricular rate of 116 bpm.  Chest x-ray showed COPD with no acute cardiopulmonary process otherwise.  High-sensitivity troponin 37 with a delta troponin of 49.  BNP 292.  D-dimer normal.  CBC unremarkable.  Potassium 3.6 trending to 4.5, serum creatinine 1.16 trending to 1.13, magnesium 2.1, TSH normal.  In the ED she received IV Lasix and was placed on a diltiazem drip.  At time of cardiology consult, she remains in atrial flutter with RVR with ventricular rate of 112 to 116 bpm.  She denies any frank angina though does continue to note fatigue and shortness of breath.  No current palpitations.  She reports adherence to apixaban twice daily and denies missing any doses.   Past Medical History:  Diagnosis Date   (HFpEF) heart failure with preserved ejection fraction ([place])    a. 01/2021 Echo: EF 55-60%, no rwma, GrII DD, nl RV fxn, mod elev PASP. Mild MR, mild-mod TR.   Abnormal mammogram, unspecified 2011   Arthritis 2002   Chronic pain syndrome 08/01/2017   GERD (gastroesophageal reflux disease)    Heart murmur 1985   Hyperlipidemia    Hypertension 2005   Hypothyroidism    Persistent atrial fibrillation/Atypical atrial flutter ([place])    a. 04/2019 TEE/DCCV; b. 05/2019 Recurrent Aflutter-->Amio-->DCCV; c. CHA2DS2VASc = 4-->Eliquis; d. 01/2021 Zio: Avg HR 55 (40-90), 5.1% PVC burden. 10 atrial runs (longest 18 beats, fastest 126). No pauses or afib/flutter. Triggered events = RSR, PVCs, and jxnl.    Past Surgical History:  Procedure Laterality Date   APPENDECTOMY  1939  BREAST BIOPSY Left 02-24-14   benign   BREAST BIOPSY Left 02/11/2016   neg   BREAST BIOPSY Left 2013   neg   BREAST EXCISIONAL BIOPSY Left 2014   neg   BREAST SURGERY Left 2013   stereotactic biopsy    BREAST SURGERY Left May 2010   1 mm foci of atypical ductal hyperplasia   CARDIOVERSION N/A 05/08/2019   Procedure: CARDIOVERSION;  Surgeon: Kate Sable, MD;  Location: [facility];  Service: Cardiovascular;   Laterality: N/A;   CARDIOVERSION N/A 05/31/2019   Procedure: CARDIOVERSION;  Surgeon: Minna Merritts, MD;  Location: [facility];  Service: Cardiovascular;  Laterality: N/A;   cataract surgery  Bilateral 2009   COLONOSCOPY  2010   Dr. Vira Agar   left breast biopsy   2009   TEE WITHOUT CARDIOVERSION N/A 05/08/2019   Procedure: TRANSESOPHAGEAL ECHOCARDIOGRAM (TEE);  Surgeon: Kate Sable, MD;  Location: [facility];  Service: Cardiovascular;  Laterality: N/A;     Home Meds: Prior to Admission medications   Medication Sig Start Date End Date Taking? Authorizing Provider  acetaminophen (TYLENOL) 325 MG tablet Take 325 mg by mouth every 6 (six) hours as needed for mild pain, fever, headache or moderate pain.   Yes [provider]  amitriptyline (ELAVIL) 25 MG tablet TAKE 1 TABLET BY MOUTH DAILY 12/22/20  Yes Masoud, Viann Shove, MD  amLODipine (NORVASC) 2.5 MG tablet Take 2.5 mg by mouth daily. 05/01/21  Yes [provider]  BIOTIN PO Take 1 tablet by mouth 3 times daily with meals, bedtime and 2 AM.   Yes [provider]  ELIQUIS 5 MG TABS tablet TAKE 1 TABLET(5 MG) BY MOUTH TWICE DAILY 05/24/21  Yes Theora Gianotti, NP  ELMIRON 100 MG capsule TAKE 1 CAPSULE BY MOUTH THREE TIMES DAILY 03/10/21  Yes Masoud, Viann Shove, MD  furosemide (LASIX) 20 MG tablet Takes 20mg  tablet PO 2-3 days per week as needed   Yes [provider]  levothyroxine (SYNTHROID) 125 MCG tablet TAKE 1 TABLET(125 MCG) BY MOUTH DAILY 06/27/21  Yes Masoud, Viann Shove, MD  losartan-hydrochlorothiazide (HYZAAR) 100-12.5 MG tablet TAKE 1 TABLET BY MOUTH DAILY 08/03/21  Yes End, Harrell Gave, MD  Multiple Vitamin (MULTIVITAMIN) capsule Take 1 capsule by mouth daily.   Yes [provider]  naproxen sodium (ALEVE) 220 MG tablet Take 220 mg by mouth daily as needed.   Yes [provider]  omeprazole (PRILOSEC) 20 MG capsule TAKE 1 CAPSULE BY MOUTH DAILY 03/01/21  Yes Masoud, Viann Shove, MD   simvastatin (ZOCOR) 20 MG tablet TAKE 1 TABLET BY MOUTH DAILY 11/16/20  Yes Masoud, Viann Shove, MD  amLODipine (NORVASC) 10 MG tablet Take 1 tablet (10 mg total) by mouth daily. Patient not taking: Reported on 08/23/2021 07/14/21   Theora Gianotti, NP    Inpatient Medications: Scheduled Meds:  apixaban  5 mg Oral BID   aspirin  325 mg Oral Daily   furosemide  20 mg Intravenous Daily   losartan  100 mg Oral Daily   And   hydrochlorothiazide  12.5 mg Oral Daily   levothyroxine  125 mcg Oral Q0600   multivitamin with minerals  1 tablet Oral Daily   pantoprazole  40 mg Oral Daily   pentosan polysulfate  100 mg Oral TID   simvastatin  20 mg Oral QHS   Continuous Infusions:  diltiazem (CARDIZEM) infusion 12.5 mg/hr (08/24/21 0307)   PRN Meds: acetaminophen, magnesium hydroxide, ondansetron (ZOFRAN) IV, traZODone  Allergies:   Allergies  Allergen Reactions  Amoxicillin Diarrhea   Penicillins Rash    Did it involve swelling of the face/tongue/throat, SOB, or low BP? No Did it involve sudden or severe rash/hives, skin peeling, or any reaction on the inside of your mouth or nose? No Did you need to seek medical attention at a hospital or doctor's office? Yes When did it last happen?      65 years ago If all above answers are NO, may proceed with cephalosporin use.    Social History:   Social History   Socioeconomic History   Marital status: Single    Spouse name: Not on file   Number of children: Not on file   Years of education: Not on file   Highest education level: Not on file  Occupational History   Not on file  Tobacco Use   Smoking status: Never   Smokeless tobacco: Never  Vaping Use   Vaping Use: Never used  Substance and Sexual Activity   Alcohol use: No    Alcohol/week: 0.0 standard drinks   Drug use: No   Sexual activity: Not on file  Other Topics Concern   Not on file  Social History Narrative   Not on file   Social Determinants of Health    Financial Resource Strain: Not on file  Food Insecurity: Not on file  Transportation Needs: Not on file  Physical Activity: Not on file  Stress: Not on file  Social Connections: Not on file  Intimate Partner Violence: Not on file     Family History:   Family History  Problem Relation Age of Onset   Heart disease Mother    Heart disease Father    Diabetes Father    Stroke Father    Arthritis Father     ROS:  Review of Systems  Constitutional:  Positive for malaise/fatigue. Negative for chills, diaphoresis, fever and weight loss.  HENT:  Negative for congestion.   Eyes:  Negative for discharge and redness.  Respiratory:  Positive for shortness of breath. Negative for cough, sputum production and wheezing.   Cardiovascular:  Positive for palpitations. Negative for chest pain, orthopnea, claudication, leg swelling and PND.  Gastrointestinal:  Negative for abdominal pain, blood in stool, heartburn, melena, nausea and vomiting.  Musculoskeletal:  Negative for falls and myalgias.  Skin:  Negative for rash.  Neurological:  Positive for weakness. Negative for dizziness, tingling, tremors, sensory change, speech change, focal weakness and loss of consciousness.  Endo/Heme/Allergies:  Does not bruise/bleed easily.  Psychiatric/Behavioral:  Negative for substance abuse. The patient is not nervous/anxious.   All other systems reviewed and are negative.    Physical Exam/Data:   Vitals:   08/24/21 0430 08/24/21 0500 08/24/21 0530 08/24/21 0600  BP: 94/79 117/69 117/73 107/60  Pulse: (!) 56 (!) 111 (!) 111 (!) 111  Resp: 16 18 13 15   Temp:      TempSrc:      SpO2: 97% 95% 96% 94%    Intake/Output Summary (Last 24 hours) at 08/24/2021 0701 Last data filed at 08/23/2021 2145 Gross per 24 hour  Intake 480 ml  Output 800 ml  Net -320 ml   There were no vitals filed for this visit. There is no height or weight on file to calculate BMI.   Physical Exam: General: Well developed,  well nourished, in no acute distress. Head: Normocephalic, atraumatic, sclera non-icteric, no xanthomas, nares without discharge.  Neck: Negative for carotid bruits. JVD not elevated. Lungs: Clear bilaterally to auscultation without wheezes,  rales, or rhonchi. Breathing is unlabored. Heart: Tachycardic, regular with S1 S2. No murmurs, rubs, or gallops appreciated. Abdomen: Soft, non-tender, non-distended with normoactive bowel sounds. No hepatomegaly. No rebound/guarding. No obvious abdominal masses. Msk:  Strength and tone appear normal for age. Extremities: No clubbing or cyanosis. No edema. Distal pedal pulses are 2+ and equal bilaterally. Neuro: Alert and oriented X 3. No facial asymmetry. No focal deficit. Moves all extremities spontaneously. Psych:  Responds to questions appropriately with a normal affect.   EKG:  The EKG was personally reviewed and demonstrates: Atrial flutter with variable AV block and RVR, 116 bpm, nonspecific ST-T changes Telemetry:  Telemetry was personally reviewed and demonstrates: Atrial flutter with RVR with ventricular rates around 112 to 116 bpm  Weights: There were no vitals filed for this visit.  Relevant CV Studies:  2D echo 02/04/2021: 1. Left ventricular ejection fraction, by estimation, is 55 to 60%. The  left ventricle has normal function. The left ventricle has no regional  wall motion abnormalities. There is mild left ventricular hypertrophy.  Left ventricular diastolic parameters  are consistent with Grade II diastolic dysfunction (pseudonormalization).   2. Right ventricular systolic function is normal. The right ventricular  size is mildly enlarged. There is moderately elevated pulmonary artery  systolic pressure.   3. Right atrial size was mildly dilated.   4. The mitral valve is normal in structure. Mild mitral valve  regurgitation.   5. Tricuspid valve regurgitation is mild to moderate.   6. The aortic valve was not well visualized.  Aortic valve regurgitation  is not visualized.   7. The inferior vena cava is normal in size with greater than 50%  respiratory variability, suggesting right atrial pressure of 3 mmHg.   Comparison(s): EF 55-60%. __________  Luci BankZio patch 12/2020: The patient was monitored for 13 days, 22 hours. The predominant rhythm was sinus with an average rate of 55 bpm (range 40-90 bpm in sinus). Periods of junctional rhythm were noted. There were rare PAC's and frequent PVC's (5.1% PVC burden). Ten atrial runs occurred, lasting up to 18 beats with a maximum rate of 126 bpm. No prolonged pause was observed. There obvious atrial fibrillation. Patient triggered events correspond to sinus rhythm, PVC's, and junctional rhythm.   Predominantly sinus rhythm with intermittent junctional rhythm, as well as frequent PVC's (5.1% burden).  Rare PAC's and brief PSVT also noted. __________  TEE 05/08/2019: 1. Left ventricular ejection fraction, by visual estimation, is 55 to  60%. The left ventricle has normal function. Normal left ventricular size.  There is no left ventricular hypertrophy.   2. Global right ventricle has normal systolic function.The right  ventricular size is normal. Right vetricular wall thickness was not  assessed.   3. Left atrial size was not well visualized.   4. Mildly dilated. no evidence of LA thrombus, velocity of 50cm/s in LA  appendage.   5. Right atrial size was normal.   6. The mitral valve is normal in structure. Mild mitral valve  regurgitation.   7. The tricuspid valve is normal in structure. Tricuspid valve  regurgitation is mild.   8. The aortic valve is tricuspid Aortic valve regurgitation was not  visualized by color flow Doppler. Structurally normal aortic valve, with  no evidence of sclerosis or stenosis.   9. The pulmonic valve was not well visualized. Pulmonic valve  regurgitation is not visualized by color flow Doppler. ___________  2D echo 05/07/2019: 1. Left  ventricular ejection fraction, by visual estimation, is  55 to  60%. The left ventricle has normal function. Normal left ventricular size.  There is mildly increased left ventricular hypertrophy.   2. Left ventricular diastolic Doppler parameters are indeterminate  pattern of LV diastolic filling.   3. Global right ventricle has normal systolic function.The right  ventricular size is normal. No increase in right ventricular wall  thickness.   4. Left atrial size was mildly dilated.   5. Mild to moderate mitral valve regurgitation.   6. Tricuspid valve regurgitation is mild.   7. Mild to Moderately elevated pulmonary artery systolic pressure.  Laboratory Data:  Chemistry Recent Labs  Lab 08/23/21 1145  NA 135  K 3.6  CL 101  CO2 23  GLUCOSE 116*  BUN 16  CREATININE 1.16*  CALCIUM 9.8  GFRNONAA 45*  ANIONGAP 11    No results for input(s): PROT, ALBUMIN, AST, ALT, ALKPHOS, BILITOT in the last 168 hours. Hematology Recent Labs  Lab 08/23/21 1145  WBC 7.2  RBC 4.28  HGB 13.4  HCT 39.3  MCV 91.8  MCH 31.3  MCHC 34.1  RDW 13.6  PLT 257   Cardiac EnzymesNo results for input(s): TROPONINI in the last 168 hours. No results for input(s): TROPIPOC in the last 168 hours.  BNP Recent Labs  Lab 08/23/21 1149  BNP 292.7*    DDimer No results for input(s): DDIMER in the last 168 hours.  Radiology/Studies:  DG Chest 2 View  Result Date: 08/23/2021 IMPRESSION: COPD. There are no signs of pulmonary edema or focal pulmonary consolidation. Electronically Signed   By: Elmer Picker M.D.   On: 08/23/2021 12:27    Assessment and Plan:   1. Atrial fib/atypical flutter with RVR: -Symptomatic with fatigue, SOB, and palpitations  -Currently remains in atrial flutter with RVR with ventricular rates in the 1-teens bpm -Continue diltiazem gtt -Evaluated by EP today in the ED and initiated on flecainide  -NPO at midnight -Plan for DCCV 1/11, has not missed any doses of  Eliquis -Following DCCV, EP recommends initiating ISA beta blocker specifically  -EP to monitor for pacing need, given baseline sinus bradycardia not on AV nodal blocking medications, to prevent symptomatic bradycardia as medications are used to treat symptomatic tachy-arrhythmias  -Will need outpatient GXT in sinus rhythm given flecainide use -TSH normal -Potassium and magnesium at goal  2. HFpEF: -Continue IV Lasix 20 mg daily  3.  HTN: -Blood pressure currently well controlled, to soft at times in the setting of diltiazem drip -Continue current medical therapy    The risks (stroke, cardiac arrhythmias rarely resulting in the need for a temporary or permanent pacemaker, skin irritation or burns and complications associated with conscious sedation including aspiration, arrhythmia, respiratory failure and death), benefits (restoration of normal sinus rhythm) and alternatives of a direct current cardioversion were explained in detail to Ms. Rycroft and she agrees to proceed.      For questions or updates, please contact [place] Please consult [email or web address] for contact info under Cardiology/STEMI.   Signed, Christell Faith, PA-C [place] Pager: [phone] 08/24/2021, 7:01 AM

## 2021-08-24 NOTE — Consult Note (Addendum)
Impression   Atrial flutter atypical 2:1 conduction  HFpEF  Bradycardia  HTN   Recurrent atrial arrhythmias which are quite discombobulated.  Previously tried on amiodarone which she did not tolerate and was further complicated by bradycardia.  No known coronary disease, and so I think a class Ic agent is easiest to try although they do require AV nodal blockade.  We will begin her on flecainide 50 mg twice daily.  We will continue her diltiazem until prior to her cardioversion.  Following cardioversion would begin her on an ISA beta-blocker specifically single 2.5 mg twice daily in the hopes that it does not create symptomatic bradycardia.  To that end, would discharge her on an ZIO AT monitor so as to allow for real-time surveillance of bradycardia.  I have reviewed with her and her friend that pacing may well be necessary to prevent symptomatic bradycardia even as we use medications to treat her tachyarrhythmia.  With her history of atrial fibrillation as well as atypical flutter, would be inclined towards a medication regime not a flutter approach  Agree with IV Lasix  Full consult to follow

## 2021-08-24 NOTE — ED Notes (Signed)
Update given to niece.

## 2021-08-24 NOTE — Progress Notes (Signed)
PROGRESS NOTE  Meagan Wilcox    DOB: 16-Jan-1931, 88 y.o.  [place]:[id number]  PCP: Pcp, No   Code Status: Full Code   DOA: 08/23/2021   LOS: 1  Brief Narrative of Current Hospitalization  Meagan Wilcox is a 88 y.o. female with a PMH significant for HFpEF, A. fib on Eliquis, GERD, HTN, HLD, hypothyroidism. They presented from home to the ED on 08/23/2021 with generalized fatigue and dyspnea dyspnea x several days. In the ED, it was found that they had Afib RVR. This is in setting of recent discontinuation of home amiodarone for side effect intolerance. They were treated with aspirin and IV cardizem gtt which improved her HR and symptoms. Cardiology was consulted.  Patient was admitted to medicine service for further workup and management of Afib RVR as outlined in detail below.  08/24/21 -stable, imrpoved  Assessment & Plan  Principal Problem:   Atrial fibrillation with rapid ventricular response (HCC)  Afib RVR- HR better controlled. Asymptomatic. EP has seen. - EP following, appreciate recs. - flecanide 50mg  BID - diltiazem continued - cardioversion planned 1/11 - ZIO patch at dc - continue IV lasix for HF symptoms  Essential hypertension- well controlled - continue meds, per cardiology  Hypothyroidism- TSH wnl on presentation - continue home synthroid  Dyslipidemia. - continue statin therapy.  History of interstitial cystitis. - continue Elmiron.  DVT prophylaxis:  apixaban (ELIQUIS) tablet 5 mg   Diet:  Diet Orders (From admission, onward)     Start     Ordered   08/23/21 1659  Diet Heart Room service appropriate? Yes; Fluid consistency: Thin  Diet effective now       Question Answer Comment  Room service appropriate? Yes   Fluid consistency: Thin      08/23/21 1700            Subjective 08/24/21    Pt reports feeling much improved other than mild generalized fatigue. No chest pain or shortness of breath.   Disposition Plan & Communication  Patient status:  Inpatient  Admitted From: Home Disposition: Home Anticipated discharge date: TBD  Family Communication: friend at bedside  Consults, Procedures, Significant Events  Consultants:  Cardiology/EP  Procedures/significant events:  Tentatively planned for cardioversion 1/11  Antimicrobials:  Anti-infectives (From admission, onward)    None       Objective   Vitals:   08/24/21 0500 08/24/21 0530 08/24/21 0600 08/24/21 0700  BP: 117/69 117/73 107/60 (!) 103/59  Pulse: (!) 111 (!) 111 (!) 111 (!) 111  Resp: 18 13 15 14   Temp:      TempSrc:      SpO2: 95% 96% 94% 96%    Intake/Output Summary (Last 24 hours) at 08/24/2021 0756 Last data filed at 08/23/2021 2145 Gross per 24 hour  Intake 480 ml  Output 800 ml  Net -320 ml   There were no vitals filed for this visit.  Patient BMI: There is no height or weight on file to calculate BMI.   Physical Exam:  General: awake, alert, NAD HEENT: atraumatic, clear conjunctiva, anicteric sclera, MMM, hard of hearing Respiratory: normal respiratory effort. CTAB Cardiovascular: tachycardia, overall irregular, no JVD, murmurs, quick capillary refill  Nervous: A&O x3. no gross focal neurologic deficits, normal speech Extremities: moves all equally, no edema, normal tone Skin: dry, intact, normal temperature, normal color. No rashes, lesions or ulcers on exposed skin Psychiatry: normal mood, congruent affect  Labs   I have personally reviewed following labs and imaging studies  Admission on 08/23/2021  Component Date Value Ref Range Status   Sodium 08/23/2021 135  135 - 145 mmol/L Final   Potassium 08/23/2021 3.6  3.5 - 5.1 mmol/L Final   Chloride 08/23/2021 101  98 - 111 mmol/L Final   CO2 08/23/2021 23  22 - 32 mmol/L Final   Glucose, Bld 08/23/2021 116 (H)  70 - 99 mg/dL Final   BUN 08/23/2021 16  8 - 23 mg/dL Final   Creatinine, Ser 08/23/2021 1.16 (H)  0.44 - 1.00 mg/dL Final   Calcium 08/23/2021 9.8  8.9 - 10.3 mg/dL Final   GFR,  Estimated 08/23/2021 45 (L)  >60 mL/min Final   Anion gap 08/23/2021 11  5 - 15 Final   WBC 08/23/2021 7.2  4.0 - 10.5 K/uL Final   RBC 08/23/2021 4.28  3.87 - 5.11 MIL/uL Final   Hemoglobin 08/23/2021 13.4  12.0 - 15.0 g/dL Final   HCT 08/23/2021 39.3  36.0 - 46.0 % Final   MCV 08/23/2021 91.8  80.0 - 100.0 fL Final   MCH 08/23/2021 31.3  26.0 - 34.0 pg Final   MCHC 08/23/2021 34.1  30.0 - 36.0 g/dL Final   RDW 08/23/2021 13.6  11.5 - 15.5 % Final   Platelets 08/23/2021 257  150 - 400 K/uL Final   nRBC 08/23/2021 0.0  0.0 - 0.2 % Final   Troponin I (High Sensitivity) 08/23/2021 37 (H)  <18 ng/L Final   Troponin I (High Sensitivity) 08/23/2021 49 (H)  <18 ng/L Final   B Natriuretic Peptide 08/23/2021 292.7 (H)  0.0 - 100.0 pg/mL Final   SARS Coronavirus 2 by RT PCR 08/23/2021 NEGATIVE  NEGATIVE Final   Influenza A by PCR 08/23/2021 NEGATIVE  NEGATIVE Final   Influenza B by PCR 08/23/2021 NEGATIVE  NEGATIVE Final   Sodium 08/24/2021 135  135 - 145 mmol/L Final   Potassium 08/24/2021 4.5  3.5 - 5.1 mmol/L Final   Chloride 08/24/2021 99  98 - 111 mmol/L Final   CO2 08/24/2021 27  22 - 32 mmol/L Final   Glucose, Bld 08/24/2021 121 (H)  70 - 99 mg/dL Final   BUN 08/24/2021 19  8 - 23 mg/dL Final   Creatinine, Ser 08/24/2021 1.13 (H)  0.44 - 1.00 mg/dL Final   Calcium 08/24/2021 9.5  8.9 - 10.3 mg/dL Final   GFR, Estimated 08/24/2021 47 (L)  >60 mL/min Final   Anion gap 08/24/2021 9  5 - 15 Final   TSH 08/23/2021 1.361  0.350 - 4.500 uIU/mL Final   WBC 08/24/2021 6.7  4.0 - 10.5 K/uL Final   RBC 08/24/2021 4.20  3.87 - 5.11 MIL/uL Final   Hemoglobin 08/24/2021 13.2  12.0 - 15.0 g/dL Final   HCT 08/24/2021 38.7  36.0 - 46.0 % Final   MCV 08/24/2021 92.1  80.0 - 100.0 fL Final   MCH 08/24/2021 31.4  26.0 - 34.0 pg Final   MCHC 08/24/2021 34.1  30.0 - 36.0 g/dL Final   RDW 08/24/2021 13.8  11.5 - 15.5 % Final   Platelets 08/24/2021 263  150 - 400 K/uL Final   nRBC 08/24/2021 0.0  0.0 -  0.2 % Final   Magnesium 08/23/2021 2.1  1.7 - 2.4 mg/dL Final    Imaging Studies  DG Chest 2 View  Result Date: 08/23/2021 CLINICAL DATA:  Shortness of breath EXAM: CHEST - 2 VIEW COMPARISON:  07/01/2020 FINDINGS: Cardiac size is unremarkable. Increase in AP diameter of chest suggests COPD. There are  no signs of pulmonary edema or focal pulmonary consolidation. There is no pleural effusion or pneumothorax. IMPRESSION: COPD. There are no signs of pulmonary edema or focal pulmonary consolidation. Electronically Signed   By: Elmer Picker M.D.   On: 08/23/2021 12:27    Medications   Scheduled Meds:  apixaban  5 mg Oral BID   furosemide  20 mg Intravenous Daily   losartan  100 mg Oral Daily   And   hydrochlorothiazide  12.5 mg Oral Daily   levothyroxine  125 mcg Oral Q0600   multivitamin with minerals  1 tablet Oral Daily   pantoprazole  40 mg Oral Daily   pentosan polysulfate  100 mg Oral TID   simvastatin  20 mg Oral QHS   No recently discontinued medications to reconcile  LOS: 1 day   Richarda Osmond, DO [facility] 08/24/2021, 7:56 AM   Available by Epic secure chat 7AM-7PM. If 7PM-7AM, please contact night-coverage Refer to [email or web address] to contact the [facility] Attending or Consulting provider for this pt

## 2021-08-24 NOTE — Consult Note (Signed)
ELECTROPHYSIOLOGY CONSULT NOTE  Patient ID: Meagan Wilcox, MRN: [id number], DOB/AGE: 11-29-1930 88 y.o. Admit date: 08/23/2021 Date of Consult: 08/24/2021  Primary Physician: Pcp, No Primary Cardiologist: CE/CB Meagan Wilcox is a 88 y.o. female who is being seen today for the evaluation of atrial flutter at the request of CE.   Chief Complaint: shortness of breath and rapid HR   HPI Meagan Wilcox is a 88 y.o. female admitted because of feeling terrible with tachy palpitations, shortness of breath and lightheadedness and found to be in atrial flutter with 2: 1 conduction.  She has a history of recurrent atrial arrhythmias having undergone cardioversion 9/20 and 10/20.  Efforts to control her recurrent arrhythmia with amiodarone failed because of intolerances and bradycardia.  She describes episodic tachypalpitations associated with the aforementioned symptoms grouping many of which stop on their own after hours.  Unfortunately this 1 did not.  She has a history of HFpEF with dyspnea on exertion in the setting of normal LV function mild MR and moderately elevated pulmonary artery pressures.  Has hypertension with therapy with amlodipine complicated by edema at doses higher than 2.5 mg.  DATE TEST EF   9/20 Echo   55-60 % MR mild-mod  6/22 Echo   55-60 % DDysfunction Gd 2        Date Cr K Hgb  1/23 1.13 4.5  13.2          Past Medical History:  Diagnosis Date   (HFpEF) heart failure with preserved ejection fraction (HCC)    a. 01/2021 Echo: EF 55-60%, no rwma, GrII DD, nl RV fxn, mod elev PASP. Mild MR, mild-mod TR.   Abnormal mammogram, unspecified 2011   Arthritis 2002   Chronic pain syndrome 08/01/2017   GERD (gastroesophageal reflux disease)    Heart murmur 1985   Hyperlipidemia    Hypertension 2005   Hypothyroidism    Persistent atrial fibrillation/Atypical atrial flutter (HCC)    a. 04/2019 TEE/DCCV; b. 05/2019 Recurrent Aflutter-->Amio-->DCCV; c. CHA2DS2VASc =  4-->Eliquis; d. 01/2021 Zio: Avg HR 55 (40-90), 5.1% PVC burden. 10 atrial runs (longest 18 beats, fastest 126). No pauses or afib/flutter. Triggered events = RSR, PVCs, and jxnl.      Surgical History:  Past Surgical History:  Procedure Laterality Date   APPENDECTOMY  1939   BREAST BIOPSY Left 02-24-14   benign   BREAST BIOPSY Left 02/11/2016   neg   BREAST BIOPSY Left 2013   neg   BREAST EXCISIONAL BIOPSY Left 2014   neg   BREAST SURGERY Left 2013   stereotactic biopsy    BREAST SURGERY Left May 2010   1 mm foci of atypical ductal hyperplasia   CARDIOVERSION N/A 05/08/2019   Procedure: CARDIOVERSION;  Surgeon: Debbe Odea, MD;  Location: [facility] ORS;  Service: Cardiovascular;  Laterality: N/A;   CARDIOVERSION N/A 05/31/2019   Procedure: CARDIOVERSION;  Surgeon: Antonieta Iba, MD;  Location: [facility] OR[facility];  Service: Cardiovascular;  Laterality: N/A;   cataract surgery  Bilateral 2009   COLONOSCOPY  2010   Dr. Mechele Collin   left breast biopsy   2009   TEE WITHOUT CARDIOVERSION N/A 05/08/2019   Procedure: TRANSESOPHAGEAL ECHOCARDIOGRAM (TEE);  Surgeon: Debbe Odea, MD;  Location: [facility] ORS;  Service: Cardiovascular;  Laterality: N/A;     Home Meds: Prior to Admission medications   Medication Sig Start Date End Date Taking? Authorizing Provider  acetaminophen (TYLENOL) 325 MG tablet Take 325 mg by mouth every 6 (six) hours  as needed for mild pain, fever, headache or moderate pain.   Yes [provider]  amitriptyline (ELAVIL) 25 MG tablet TAKE 1 TABLET BY MOUTH DAILY 12/22/20  Yes Masoud, Renda Rolls, MD  amLODipine (NORVASC) 2.5 MG tablet Take 2.5 mg by mouth daily. 05/01/21  Yes [provider]  BIOTIN PO Take 1 tablet by mouth 3 times daily with meals, bedtime and 2 AM.   Yes [provider]  ELIQUIS 5 MG TABS tablet TAKE 1 TABLET(5 MG) BY MOUTH TWICE DAILY 05/24/21  Yes Creig Hines, NP  ELMIRON 100 MG capsule TAKE 1 CAPSULE BY MOUTH THREE  TIMES DAILY 03/10/21  Yes Masoud, Renda Rolls, MD  furosemide (LASIX) 20 MG tablet Takes 20mg  tablet PO 2-3 days per week as needed   Yes [provider]  levothyroxine (SYNTHROID) 125 MCG tablet TAKE 1 TABLET(125 MCG) BY MOUTH DAILY 06/27/21  Yes Masoud, 06/29/21, MD  losartan-hydrochlorothiazide (HYZAAR) 100-12.5 MG tablet TAKE 1 TABLET BY MOUTH DAILY 08/03/21  Yes End, 08/05/21, MD  Multiple Vitamin (MULTIVITAMIN) capsule Take 1 capsule by mouth daily.   Yes [provider]  naproxen sodium (ALEVE) 220 MG tablet Take 220 mg by mouth daily as needed.   Yes [provider]  omeprazole (PRILOSEC) 20 MG capsule TAKE 1 CAPSULE BY MOUTH DAILY 03/01/21  Yes Masoud, 03/03/21, MD  simvastatin (ZOCOR) 20 MG tablet TAKE 1 TABLET BY MOUTH DAILY 11/16/20  Yes Masoud, 01/16/21, MD  amLODipine (NORVASC) 10 MG tablet Take 1 tablet (10 mg total) by mouth daily. Patient not taking: Reported on 08/23/2021 07/14/21   07/16/21, NP    Inpatient Medications:   apixaban  5 mg Oral BID   flecainide  50 mg Oral Q12H   furosemide  20 mg Intravenous Daily   losartan  100 mg Oral Daily   And   hydrochlorothiazide  12.5 mg Oral Daily   levothyroxine  125 mcg Oral Q0600   multivitamin with minerals  1 tablet Oral Daily   pantoprazole  40 mg Oral Daily   pentosan polysulfate  100 mg Oral TID   simvastatin  20 mg Oral QHS      Allergies:  Allergies  Allergen Reactions   Amoxicillin Diarrhea   Penicillins Rash    Did it involve swelling of the face/tongue/throat, SOB, or low BP? No Did it involve sudden or severe rash/hives, skin peeling, or any reaction on the inside of your mouth or nose? No Did you need to seek medical attention at a hospital or doctor's office? Yes When did it last happen?      65 years ago If all above answers are NO, may proceed with cephalosporin use.    Social History   Socioeconomic History   Marital status: Single    Spouse name: Not on file   Number  of children: Not on file   Years of education: Not on file   Highest education level: Not on file  Occupational History   Not on file  Tobacco Use   Smoking status: Never   Smokeless tobacco: Never  Vaping Use   Vaping Use: Never used  Substance and Sexual Activity   Alcohol use: No    Alcohol/week: 0.0 standard drinks   Drug use: No   Sexual activity: Not on file  Other Topics Concern   Not on file  Social History Narrative   Not on file   Social Determinants of Health   Financial Resource Strain: Not on file  Food Insecurity: Not on file  Transportation Needs: Not on file  Physical Activity: Not on file  Stress: Not on file  Social Connections: Not on file  Intimate Partner Violence: Not on file     Family History  Problem Relation Age of Onset   Heart disease Mother    Heart disease Father    Diabetes Father    Stroke Father    Arthritis Father      ROS:  Please see the history of present illness.     All other systems reviewed and negative.    Physical Exam:  Blood pressure 119/72, pulse (!) 117, temperature 97.7 F (36.5 C), temperature source Oral, resp. rate 16, SpO2 99 %. General: Well developed, well nourished female in no acute distress. Head: Normocephalic, atraumatic, sclera non-icteric, no xanthomas, nares are without discharge. EENT: normal Lymph Nodes:  none Back: without scoliosis/kyphosis , no CVA tendersness Neck: Negative for carotid bruits. JVD 7 cm Lungs: Bibasilar crackles Heart: R rapid but regular rate and rhythm with S1 S2. 2/6 systolic murmur , rubs, or gallops appreciated. Abdomen: Soft, non-tender, non-distended with normoactive bowel sounds. No hepatomegaly. No rebound/guarding. No obvious abdominal masses. Msk:  Strength and tone appear normal for age. Extremities: No clubbing or cyanosis. No edema.  Distal pedal pulses are 2+ and equal bilaterally. Skin: Warm and Dry Neuro: Alert and oriented X 3. CN III-XII intact Grossly  normal sensory and motor function . Psych:  Responds to questions appropriately with a normal affect.      Labs: Cardiac Enzymes No results for input(s): CKTOTAL, CKMB, TROPONINI in the last 72 hours. CBC Lab Results  Component Value Date   WBC 6.7 08/24/2021   HGB 13.2 08/24/2021   HCT 38.7 08/24/2021   MCV 92.1 08/24/2021   PLT 263 08/24/2021   PROTIME: No results for input(s): LABPROT, INR in the last 72 hours. Chemistry  Recent Labs  Lab 08/24/21 0643  NA 135  K 4.5  CL 99  CO2 27  BUN 19  CREATININE 1.13*  CALCIUM 9.5  GLUCOSE 121*   Lipids No results found for: CHOL, HDL, LDLCALC, TRIG BNP No results found for: PROBNP Thyroid Function Tests: Recent Labs    08/23/21 1444  TSH 1.361      Miscellaneous Lab Results  Component Value Date   DDIMER <0.27 08/24/2021    Radiology/Studies:  DG Chest 2 View  Result Date: 08/23/2021 CLINICAL DATA:  Shortness of breath EXAM: CHEST - 2 VIEW COMPARISON:  07/01/2020 FINDINGS: Cardiac size is unremarkable. Increase in AP diameter of chest suggests COPD. There are no signs of pulmonary edema or focal pulmonary consolidation. There is no pleural effusion or pneumothorax. IMPRESSION: COPD. There are no signs of pulmonary edema or focal pulmonary consolidation. Electronically Signed   By: Ernie AvenaPalani  Rathinasamy M.D.   On: 08/23/2021 12:27    EKG: Atrial flutter-atypical 2: 1 conduction with an atrial cycle length of approximately 260 ms   GFR  approx 40 ml./min    Assessment and Plan:  See other note   Sherryl MangesSteven Katena Petitjean

## 2021-08-25 ENCOUNTER — Other Ambulatory Visit: Payer: Self-pay

## 2021-08-25 ENCOUNTER — Inpatient Hospital Stay: Payer: Medicare PPO | Admitting: Registered Nurse

## 2021-08-25 ENCOUNTER — Encounter
Admission: EM | Disposition: A | Discharge status: Skilled Nursing Facility | Payer: Self-pay | Source: Home / Self Care | Attending: Internal Medicine

## 2021-08-25 ENCOUNTER — Encounter: Payer: Self-pay | Admitting: Family Medicine

## 2021-08-25 HISTORY — PX: CARDIOVERSION: SHX1299

## 2021-08-25 SURGERY — CARDIOVERSION
Anesthesia: General

## 2021-08-25 MED ORDER — SODIUM CHLORIDE 0.9 % IV SOLN: INTRAVENOUS | Status: DC

## 2021-08-25 MED ORDER — AMITRIPTYLINE HCL 25 MG PO TABS
25.0000 mg | ORAL_TABLET | Freq: Every day | ORAL | Status: DC
  Administered 2021-08-25 – 2021-08-30 (×6): 25 mg via ORAL
  Filled 2021-08-25 (×6): qty 1

## 2021-08-25 MED ORDER — FUROSEMIDE 20 MG PO TABS
20.0000 mg | ORAL_TABLET | Freq: Every day | ORAL | Status: DC
  Administered 2021-08-26 – 2021-08-31 (×6): 20 mg via ORAL
  Filled 2021-08-25 (×8): qty 1

## 2021-08-25 MED ORDER — PROPOFOL 10 MG/ML IV BOLUS
INTRAVENOUS | Status: DC | PRN
  Administered 2021-08-25: 40 mg via INTRAVENOUS

## 2021-08-25 MED ORDER — PINDOLOL 5 MG PO TABS
2.5000 mg | ORAL_TABLET | Freq: Two times a day (BID) | ORAL | Status: DC
  Administered 2021-08-25 – 2021-08-30 (×7): 2.5 mg via ORAL
  Filled 2021-08-25 (×16): qty 1

## 2021-08-25 NOTE — ED Notes (Signed)
Report given to [facility] for procedure.

## 2021-08-25 NOTE — Transfer of Care (Signed)
Immediate Anesthesia Transfer of Care Note  Patient: Meagan Wilcox  Procedure(s) Performed: CARDIOVERSION  Patient Location: Special Procedures  Anesthesia Type:General  Level of Consciousness: drowsy  Airway & Oxygen Therapy: Patient Spontanous Breathing and Patient connected to nasal cannula oxygen  Post-op Assessment: Report given to RN and Post -op Vital signs reviewed and stable  Post vital signs: Reviewed and stable  Last Vitals:  Vitals Value Taken Time  BP 102/56 08/25/21 0824  Temp 36.9 C 08/25/21 0810  Pulse 58 08/25/21 0825  Resp 19 08/25/21 0825  SpO2 96 % 08/25/21 0825    Last Pain:  Vitals:   08/25/21 0759  TempSrc:   PainSc: 0-No pain         Complications: No notable events documented.

## 2021-08-25 NOTE — Progress Notes (Signed)
PROGRESS NOTE    Meagan Wilcox  [id number] DOB: Sep 02, 1930 DOA: 08/23/2021 PCP: Pcp, No    Brief Narrative:   88 y.o. female with a PMH significant for HFpEF, A. fib on Eliquis, GERD, HTN, HLD, hypothyroidism. They presented from home to the ED on 08/23/2021 with generalized fatigue and dyspnea dyspnea x several days. In the ED, it was found that they had Afib RVR. This is in setting of recent discontinuation of home amiodarone for side effect intolerance. They were treated with aspirin and IV cardizem gtt which improved her HR and symptoms. Cardiology was consulted.  Patient was admitted to medicine service for further workup and management of Afib RVR as outlined in detail below.  Patient underwent direct current cardioversion on 1/11.  Sinus rhythm restored   Assessment & Plan:   Principal Problem:   Atrial fibrillation with rapid ventricular response (HCC) Active Problems:   Atrial flutter with rapid ventricular response (HCC)  Atrial flutter with rapid ventricular response -Status post DCCV on 1/11 with restoration of sinus rhythm -Case discussed with cardiology Plan: Continue telemetry monitoring Pindolol 2.5 twice daily Flecainide 50 mg twice daily Zio patch at discharge  Essential hypertension  well controlled - Beta-blocker as above  Hypothyroidism  TSH wnl on presentation - continue home synthroid   Dyslipidemia. - continue statin therapy.  History of interstitial cystitis. - continue Elmiron.   DVT prophylaxis: Apixaban Code Status: Full Family Communication: None today Disposition Plan: Status is: Inpatient  Remains inpatient appropriate because: Atrial flutter status post DCCV.  Monitor for next 24 hours.  Possible discharge on 1/12       Level of care: Progressive  Consultants:  Cardiology Procedures:  DCCV 1/11  Antimicrobials: None   Subjective: Seen and examined.  Resting comfortably in bed.  No visible distress.  No pain  complaints.  Chest pain-free  Objective: Vitals:   08/25/21 0930 08/25/21 1000 08/25/21 1034 08/25/21 1429  BP: 121/68 (!) 116/49 (!) 134/50 (!) 123/48  Pulse: 61 64 67 67  Resp: 15 15 20 16   Temp:   98.1 F (36.7 C) 98.1 F (36.7 C)  TempSrc:      SpO2: 98% 98% 100% 96%  Weight:        Intake/Output Summary (Last 24 hours) at 08/25/2021 1533 Last data filed at 08/25/2021 0825 Gross per 24 hour  Intake 100 ml  Output --  Net 100 ml   Filed Weights   08/25/21 0759  Weight: 71.7 kg    Examination:  General exam: Appears calm and comfortable  Respiratory system: Clear to auscultation. Respiratory effort normal. Cardiovascular system: S1-S2, RRR, no murmurs, no pedal edema Gastrointestinal system: Soft, NT/ND, normal bowel sounds Central nervous system: Alert and oriented. No focal neurological deficits. Extremities: Symmetric 5 x 5 power. Skin: No rashes, lesions or ulcers Psychiatry: Judgement and insight appear normal. Mood & affect appropriate.     Data Reviewed: I have personally reviewed following labs and imaging studies  CBC: Recent Labs  Lab 08/23/21 1145 08/24/21 0643  WBC 7.2 6.7  HGB 13.4 13.2  HCT 39.3 38.7  MCV 91.8 92.1  PLT 257 [id number]   Basic Metabolic Panel: Recent Labs  Lab 08/23/21 1145 08/23/21 1444 08/24/21 0643  NA 135  --  135  K 3.6  --  4.5  CL 101  --  99  CO2 23  --  27  GLUCOSE 116*  --  121*  BUN 16  --  19  CREATININE 1.16*  --  1.13*  CALCIUM 9.8  --  9.5  MG  --  2.1  --    GFR: Estimated Creatinine Clearance: 33.4 mL/min (A) (by C-G formula based on SCr of 1.13 mg/dL (H)). Liver Function Tests: No results for input(s): AST, ALT, ALKPHOS, BILITOT, PROT, ALBUMIN in the last 168 hours. No results for input(s): LIPASE, AMYLASE in the last 168 hours. No results for input(s): AMMONIA in the last 168 hours. Coagulation Profile: No results for input(s): INR, PROTIME in the last 168 hours. Cardiac Enzymes: No results for  input(s): CKTOTAL, CKMB, CKMBINDEX, TROPONINI in the last 168 hours. BNP (last 3 results) No results for input(s): PROBNP in the last 8760 hours. HbA1C: No results for input(s): HGBA1C in the last 72 hours. CBG: No results for input(s): GLUCAP in the last 168 hours. Lipid Profile: No results for input(s): CHOL, HDL, LDLCALC, TRIG, CHOLHDL, LDLDIRECT in the last 72 hours. Thyroid Function Tests: Recent Labs    08/23/21 1444  TSH 1.361   Anemia Panel: No results for input(s): VITAMINB12, FOLATE, FERRITIN, TIBC, IRON, RETICCTPCT in the last 72 hours. Sepsis Labs: No results for input(s): PROCALCITON, LATICACIDVEN in the last 168 hours.  Recent Results (from the past 240 hour(s))  Resp Panel by RT-PCR (Flu A&B, Covid) Nasopharyngeal Swab     Status: None   Collection Time: 08/23/21  2:44 PM   Specimen: Nasopharyngeal Swab; Nasopharyngeal(NP) swabs in vial transport medium  Result Value Ref Range Status   SARS Coronavirus 2 by RT PCR NEGATIVE NEGATIVE Final    Comment: (NOTE) SARS-CoV-2 target nucleic acids are NOT DETECTED.  The SARS-CoV-2 RNA is generally detectable in upper respiratory specimens during the acute phase of infection. The lowest concentration of SARS-CoV-2 viral copies this assay can detect is 138 copies/mL. A negative result does not preclude SARS-Cov-2 infection and should not be used as the sole basis for treatment or other patient management decisions. A negative result may occur with  improper specimen collection/handling, submission of specimen other than nasopharyngeal swab, presence of viral mutation(s) within the areas targeted by this assay, and inadequate number of viral copies(<138 copies/mL). A negative result must be combined with clinical observations, patient history, and epidemiological information. The expected result is Negative.  Fact Sheet for Patients:  EntrepreneurPulse.com.au  Fact Sheet for Healthcare Providers:   IncredibleEmployment.be  This test is no t yet approved or cleared by the [place] FDA and  has been authorized for detection and/or diagnosis of SARS-CoV-2 by FDA under an Emergency Use Authorization (EUA). This EUA will remain  in effect (meaning this test can be used) for the duration of the COVID-19 declaration under Section 564(b)(1) of the Act, 21 U.S.C.section 360bbb-3(b)(1), unless the authorization is terminated  or revoked sooner.       Influenza A by PCR NEGATIVE NEGATIVE Final   Influenza B by PCR NEGATIVE NEGATIVE Final    Comment: (NOTE) The Xpert Xpress SARS-CoV-2/FLU/RSV plus assay is intended as an aid in the diagnosis of influenza from Nasopharyngeal swab specimens and should not be used as a sole basis for treatment. Nasal washings and aspirates are unacceptable for Xpert Xpress SARS-CoV-2/FLU/RSV testing.  Fact Sheet for Patients: EntrepreneurPulse.com.au  Fact Sheet for Healthcare Providers: IncredibleEmployment.be  This test is not yet approved or cleared by the [place] FDA and has been authorized for detection and/or diagnosis of SARS-CoV-2 by FDA under an Emergency Use Authorization (EUA). This EUA will remain in effect (meaning this test can be used) for the duration  of the COVID-19 declaration under Section 564(b)(1) of the Act, 21 U.S.C. section 360bbb-3(b)(1), unless the authorization is terminated or revoked.  Performed at [facility], [address], [place], [place] [address]          Radiology Studies: No results found.      Scheduled Meds:  apixaban  5 mg Oral BID   flecainide  50 mg Oral Q12H   furosemide  20 mg Oral Daily   losartan  100 mg Oral Daily   And   hydrochlorothiazide  12.5 mg Oral Daily   levothyroxine  125 mcg Oral Q0600   multivitamin with minerals  1 tablet Oral Daily   pantoprazole  40 mg Oral Daily   pentosan polysulfate  100 mg  Oral TID   pindolol  2.5 mg Oral BID   simvastatin  20 mg Oral QHS   Continuous Infusions:   LOS: 2 days    Time spent: 35 minutes    Sidney Ace, MD [facility]   If 7PM-7AM, please contact night-coverage  08/25/2021, 3:33 PM

## 2021-08-25 NOTE — Progress Notes (Signed)
Progress Note  Patient Name: Meagan Wilcox Date of Encounter: 08/25/2021  [facility] Cardiologist: Nelva Bush, MD   Subjective   Doing ok, denies cp or sob. Still in atrial flutter  Inpatient Medications    Scheduled Meds:  [MAR Hold] apixaban  5 mg Oral BID   [MAR Hold] flecainide  50 mg Oral Q12H   [MAR Hold] furosemide  20 mg Intravenous Daily   [MAR Hold] losartan  100 mg Oral Daily   And   [MAR Hold] hydrochlorothiazide  12.5 mg Oral Daily   [MAR Hold] levothyroxine  125 mcg Oral Q0600   [MAR Hold] multivitamin with minerals  1 tablet Oral Daily   [MAR Hold] pantoprazole  40 mg Oral Daily   [MAR Hold] pentosan polysulfate  100 mg Oral TID   [MAR Hold] simvastatin  20 mg Oral QHS   Continuous Infusions:  sodium chloride     PRN Meds: [MAR Hold] acetaminophen, [MAR Hold] magnesium hydroxide, [MAR Hold] ondansetron (ZOFRAN) IV, [MAR Hold] traZODone   Vital Signs    Vitals:   08/25/21 0630 08/25/21 0700 08/25/21 0759 08/25/21 0810  BP: 99/66 124/71 133/79   Pulse: (!) 110 (!) 110 (!) 111   Resp: 14 16 16    Temp:    98.5 F (36.9 C)  TempSrc:      SpO2:  94% 99%   Weight:   71.7 kg    No intake or output data in the 24 hours ending 08/25/21 0823 Last 3 Weights 08/25/2021 07/14/2021 05/13/2021  Weight (lbs) 158 lb 162 lb 162 lb  Weight (kg) 71.668 kg 73.483 kg 73.483 kg      Telemetry    Atrial flutter - Personally Reviewed  ECG     - Personally Reviewed  Physical Exam   GEN: No acute distress.   Neck: No JVD Cardiac: tachycardic regular Respiratory: Clear to auscultation bilaterally. GI: Soft, nontender, non-distended  MS: No edema; No deformity. Neuro:  Nonfocal  Psych: Normal affect   Labs    High Sensitivity Troponin:   Recent Labs  Lab 08/23/21 1145 08/23/21 1444  TROPONINIHS 37* 49*     Chemistry Recent Labs  Lab 08/23/21 1145 08/23/21 1444 08/24/21 0643  NA 135  --  135  K 3.6  --  4.5  CL 101  --  99  CO2 23  --   27  GLUCOSE 116*  --  121*  BUN 16  --  19  CREATININE 1.16*  --  1.13*  CALCIUM 9.8  --  9.5  MG  --  2.1  --   GFRNONAA 45*  --  47*  ANIONGAP 11  --  9    Lipids No results for input(s): CHOL, TRIG, HDL, LABVLDL, LDLCALC, CHOLHDL in the last 168 hours.  Hematology Recent Labs  Lab 08/23/21 1145 08/24/21 0643  WBC 7.2 6.7  RBC 4.28 4.20  HGB 13.4 13.2  HCT 39.3 38.7  MCV 91.8 92.1  MCH 31.3 31.4  MCHC 34.1 34.1  RDW 13.6 13.8  PLT 257 263   Thyroid  Recent Labs  Lab 08/23/21 1444  TSH 1.361    BNP Recent Labs  Lab 08/23/21 1149  BNP 292.7*    DDimer  Recent Labs  Lab 08/24/21 0815  DDIMER <0.27     Radiology    DG Chest 2 View  Result Date: 08/23/2021 CLINICAL DATA:  Shortness of breath EXAM: CHEST - 2 VIEW COMPARISON:  07/01/2020 FINDINGS: Cardiac size is  unremarkable. Increase in AP diameter of chest suggests COPD. There are no signs of pulmonary edema or focal pulmonary consolidation. There is no pleural effusion or pneumothorax. IMPRESSION: COPD. There are no signs of pulmonary edema or focal pulmonary consolidation. Electronically Signed   By: Elmer Picker M.D.   On: 08/23/2021 12:27    Cardiac Studies   2D echo 02/04/2021: 1. Left ventricular ejection fraction, by estimation, is 55 to 60%. The  left ventricle has normal function. The left ventricle has no regional  wall motion abnormalities. There is mild left ventricular hypertrophy.  Left ventricular diastolic parameters  are consistent with Grade II diastolic dysfunction (pseudonormalization).   2. Right ventricular systolic function is normal. The right ventricular  size is mildly enlarged. There is moderately elevated pulmonary artery  systolic pressure.   3. Right atrial size was mildly dilated.   4. The mitral valve is normal in structure. Mild mitral valve  regurgitation.   5. Tricuspid valve regurgitation is mild to moderate.   6. The aortic valve was not well visualized. Aortic  valve regurgitation  is not visualized.   7. The inferior vena cava is normal in size with greater than 50%  respiratory variability, suggesting right atrial pressure of 3 mmHg.  Patient Profile     88 y.o. female with hx of afib/flutter s/p DCCV x 2, htn, hfpef presenting with sob and fatigue being seen for aflutter with rapid ventricular response  Assessment & Plan    Atrial flutter -still tachycardic -plan for dccv today -start pindolol 2.5mg  bid after cardioversion -cont flecainide 50mg  bid -cont eliquis.  2. Hfpef -appears euvolemic -stop iv lasix. Start pta po lasix 20mg  qd  3. Htn -bp low normal -cont pta losartan. Cont decreasing if bp stays low.  Total encounter time 35 minutes  Greater than 50% was spent in counseling and coordination of care with the patient        Signed, Kate Sable, MD  08/25/2021, 8:23 AM

## 2021-08-25 NOTE — Anesthesia Preprocedure Evaluation (Addendum)
Anesthesia Evaluation  Patient identified by MRN, date of birth, ID band Patient awake    Reviewed: Allergy & Precautions, NPO status , Patient's Chart, lab work & pertinent test results  History of Anesthesia Complications Negative for: history of anesthetic complications  Airway Mallampati: III  TM Distance: >3 FB Neck ROM: Full    Dental  (+) Implants   Pulmonary neg sleep apnea, neg COPD, Not current smoker,    Pulmonary exam normal breath sounds clear to auscultation       Cardiovascular Exercise Tolerance: Poor hypertension, Pt. on medications +CHF (heart failure with preserved ejection fraction), + PND and + DOE  (-) Past MI + dysrhythmias (s/p cardioversion 2020) Atrial Fibrillation + Valvular Problems/Murmurs MVP  Rhythm:Irregular Rate:Tachycardia  01/2021 Echo: EF 55-60%, no rwma, GrII DD, nl RV fxn, mod elev PASP. Mild MR, mild-mod TR.   Neuro/Psych neg Seizures PSYCHIATRIC DISORDERS Anxiety    GI/Hepatic Neg liver ROS, GERD  Medicated and Controlled,  Endo/Other  neg diabetesHypothyroidism   Renal/GU negative Renal ROS     Musculoskeletal  (+) Arthritis , Neuroforaminal stenosis of lumbar spine   Abdominal   Peds  Hematology   Anesthesia Other Findings Pt with recent tachy palpitations, shortness of breath and lightheadedness and found to be in atrial flutter with 2: 1 conduction.  Reproductive/Obstetrics                            Anesthesia Physical  Anesthesia Plan  ASA: 4  Anesthesia Plan: General   Post-op Pain Management:    Induction: Intravenous  PONV Risk Score and Plan: 3 and Treatment may vary due to age or medical condition and TIVA  Airway Management Planned: Nasal Cannula and Natural Airway  Additional Equipment:   Intra-op Plan:   Post-operative Plan:   Informed Consent: I have reviewed the patients History and Physical, chart, labs and discussed the  procedure including the risks, benefits and alternatives for the proposed anesthesia with the patient or authorized representative who has indicated his/her understanding and acceptance.     Dental advisory given  Plan Discussed with: CRNA and Anesthesiologist  Anesthesia Plan Comments:        Anesthesia Quick Evaluation

## 2021-08-25 NOTE — Procedures (Signed)
Cardioversion procedure note For atrial flutter.  Procedure Details:  Consent: Risks of procedure as well as the alternatives and risks of each were explained to the (patient/caregiver).  Consent for procedure obtained.  Time Out: Verified patient identification, verified procedure, site/side was marked, verified correct patient position, special equipment/implants available, medications/allergies/relevent history reviewed, required imaging and test results available.  Performed  Patient placed on cardiac monitor, pulse oximetry, supplemental oxygen as necessary.   Sedation given: propofol IV per anesthesia team Pacer pads placed anterior and posterior chest.   Cardioverted 1 time(s).   Cardioverted at 100J. Synchronized biphasic Converted to NSR   Evaluation: Findings: Post procedure EKG shows: NSR Complications: None Patient did tolerate procedure well.  Time Spent Directly with the Patient:  25 minutes   Roshaun Pound [place], M.D.  

## 2021-08-26 ENCOUNTER — Encounter: Payer: Self-pay | Admitting: Cardiology

## 2021-08-26 DIAGNOSIS — R531 Weakness: Secondary | ICD-10-CM

## 2021-08-26 LAB — CBC WITH DIFFERENTIAL/PLATELET
Abs Immature Granulocytes: 0.11 10*3/uL — ABNORMAL HIGH (ref 0.00–0.07)
Basophils Absolute: 0.1 10*3/uL (ref 0.0–0.1)
Basophils Relative: 1 %
Eosinophils Absolute: 0.1 10*3/uL (ref 0.0–0.5)
Eosinophils Relative: 2 %
HCT: 36 % (ref 36.0–46.0)
Hemoglobin: 12.2 g/dL (ref 12.0–15.0)
Immature Granulocytes: 1 %
Lymphocytes Relative: 36 %
Lymphs Abs: 3 10*3/uL (ref 0.7–4.0)
MCH: 31.4 pg (ref 26.0–34.0)
MCHC: 33.9 g/dL (ref 30.0–36.0)
MCV: 92.8 fL (ref 80.0–100.0)
Monocytes Absolute: 0.9 10*3/uL (ref 0.1–1.0)
Monocytes Relative: 10 %
Neutro Abs: 4.1 10*3/uL (ref 1.7–7.7)
Neutrophils Relative %: 50 %
Platelets: 243 10*3/uL (ref 150–400)
RBC: 3.88 MIL/uL (ref 3.87–5.11)
RDW: 13.9 % (ref 11.5–15.5)
WBC: 8.2 10*3/uL (ref 4.0–10.5)
nRBC: 0 % (ref 0.0–0.2)

## 2021-08-26 LAB — BASIC METABOLIC PANEL
Anion gap: 9 (ref 5–15)
BUN: 24 mg/dL — ABNORMAL HIGH (ref 8–23)
CO2: 27 mmol/L (ref 22–32)
Calcium: 9.6 mg/dL (ref 8.9–10.3)
Chloride: 99 mmol/L (ref 98–111)
Creatinine, Ser: 1.14 mg/dL — ABNORMAL HIGH (ref 0.44–1.00)
GFR, Estimated: 46 mL/min — ABNORMAL LOW (ref >60)
Glucose, Bld: 117 mg/dL — ABNORMAL HIGH (ref 70–99)
Potassium: 4.4 mmol/L (ref 3.5–5.1)
Sodium: 135 mmol/L (ref 135–145)

## 2021-08-26 LAB — MAGNESIUM: Magnesium: 2 mg/dL (ref 1.7–2.4)

## 2021-08-26 NOTE — Progress Notes (Signed)
PROGRESS NOTE    Meagan Wilcox  PFX:[id number] DOB: 25-Apr-1931 DOA: 08/23/2021 PCP: Pcp, No    Brief Narrative:   88 y.o. female with a PMH significant for HFpEF, A. fib on Eliquis, GERD, HTN, HLD, hypothyroidism. They presented from home to the ED on 08/23/2021 with generalized fatigue and dyspnea dyspnea x several days. In the ED, it was found that they had Afib RVR. This is in setting of recent discontinuation of home amiodarone for side effect intolerance. They were treated with aspirin and IV cardizem gtt which improved her HR and symptoms. Cardiology was consulted.  Patient was admitted to medicine service for further workup and management of Afib RVR as outlined in detail below.  Patient underwent direct current cardioversion on 1/11.  Sinus rhythm restored  Post cardioversion patient remains in sinus rhythm.  Her main complaint is weakness.  Pending therapy evaluations.    Assessment & Plan:   Principal Problem:   Atrial fibrillation with rapid ventricular response (HCC) Active Problems:   Atrial flutter with rapid ventricular response (HCC)  Atrial flutter with rapid ventricular response -Status post DCCV on 1/11 with restoration of sinus rhythm -Case discussed with cardiology Plan: Continue telemetry monitoring Pindolol 2.5 twice daily Flecainide 50 mg twice daily Monitor replace electrolytes Will contact cardiology service about placing Zio patch prior to discharge  Essential hypertension  well controlled - Beta-blocker as above  Hypothyroidism  TSH wnl on presentation - continue home synthroid   Dyslipidemia. - continue statin therapy.  History of interstitial cystitis. - continue Elmiron.  Weakness Functional decline Encourage p.o. nutrition Monitor and replace electrolytes as needed PT and OT evaluations TOC consult for placement versus home with home health   DVT prophylaxis: Apixaban Code Status: Full Family Communication: None  today Disposition Plan: Status is: Inpatient  Remains inpatient appropriate because: Atrial flutter status post DCCV.  Remains in sinus.  Monitor on telemetry.  Therapy evaluations.  Discharge planning.  Discharge anticipated 1/13       Level of care: Progressive  Consultants:  Cardiology Procedures:  DCCV 1/11  Antimicrobials: None   Subjective: Patient seen and examined.  Resting comfortably in bed.  Does appear weak and fatigued.  No pain complaints.  Objective: Vitals:   08/25/21 2350 08/26/21 0511 08/26/21 0738 08/26/21 1137  BP: (!) 130/52 (!) 120/51 (!) 127/53 (!) 120/45  Pulse:  61 63 (!) 59  Resp: 17 17 16 18   Temp: 98.6 F (37 C) 98.5 F (36.9 C) 98.2 F (36.8 C) 98.1 F (36.7 C)  TempSrc: Oral Oral Oral Oral  SpO2: 96% 96% 96% 98%  Weight:        Intake/Output Summary (Last 24 hours) at 08/26/2021 1415 Last data filed at 08/26/2021 1350 Gross per 24 hour  Intake 1162.45 ml  Output --  Net 1162.45 ml   Filed Weights   08/25/21 0759  Weight: 71.7 kg    Examination:  General exam: No acute distress.  Appears fatigued Respiratory system: Poor respiratory effort.  Lungs clear.  Normal work of breathing.  Room air Cardiovascular system: S1-S2, RRR, no murmurs, no pedal edema Gastrointestinal system: Soft, NT/ND, normal bowel sounds Central nervous system: Alert and oriented. No focal neurological deficits. Extremities: Symmetric 5 x 5 power. Skin: No rashes, lesions or ulcers Psychiatry: Judgement and insight appear normal. Mood & affect appropriate.     Data Reviewed: I have personally reviewed following labs and imaging studies  CBC: Recent Labs  Lab 08/23/21 1145 08/24/21 0643 08/26/21  0804  WBC 7.2 6.7 8.2  NEUTROABS  --   --  4.1  HGB 13.4 13.2 12.2  HCT 39.3 38.7 36.0  MCV 91.8 92.1 92.8  PLT 257 263 243   Basic Metabolic Panel: Recent Labs  Lab 08/23/21 1145 08/23/21 1444 08/24/21 0643 08/26/21 0804  NA 135  --  135 135   K 3.6  --  4.5 4.4  CL 101  --  99 99  CO2 23  --  27 27  GLUCOSE 116*  --  121* 117*  BUN 16  --  19 24*  CREATININE 1.16*  --  1.13* 1.14*  CALCIUM 9.8  --  9.5 9.6  MG  --  2.1  --  2.0   GFR: Estimated Creatinine Clearance: 33.1 mL/min (A) (by C-G formula based on SCr of 1.14 mg/dL (H)). Liver Function Tests: No results for input(s): AST, ALT, ALKPHOS, BILITOT, PROT, ALBUMIN in the last 168 hours. No results for input(s): LIPASE, AMYLASE in the last 168 hours. No results for input(s): AMMONIA in the last 168 hours. Coagulation Profile: No results for input(s): INR, PROTIME in the last 168 hours. Cardiac Enzymes: No results for input(s): CKTOTAL, CKMB, CKMBINDEX, TROPONINI in the last 168 hours. BNP (last 3 results) No results for input(s): PROBNP in the last 8760 hours. HbA1C: No results for input(s): HGBA1C in the last 72 hours. CBG: No results for input(s): GLUCAP in the last 168 hours. Lipid Profile: No results for input(s): CHOL, HDL, LDLCALC, TRIG, CHOLHDL, LDLDIRECT in the last 72 hours. Thyroid Function Tests: Recent Labs    08/23/21 1444  TSH 1.361   Anemia Panel: No results for input(s): VITAMINB12, FOLATE, FERRITIN, TIBC, IRON, RETICCTPCT in the last 72 hours. Sepsis Labs: No results for input(s): PROCALCITON, LATICACIDVEN in the last 168 hours.  Recent Results (from the past 240 hour(s))  Resp Panel by RT-PCR (Flu A&B, Covid) Nasopharyngeal Swab     Status: None   Collection Time: 08/23/21  2:44 PM   Specimen: Nasopharyngeal Swab; Nasopharyngeal(NP) swabs in vial transport medium  Result Value Ref Range Status   SARS Coronavirus 2 by RT PCR NEGATIVE NEGATIVE Final    Comment: (NOTE) SARS-CoV-2 target nucleic acids are NOT DETECTED.  The SARS-CoV-2 RNA is generally detectable in upper respiratory specimens during the acute phase of infection. The lowest concentration of SARS-CoV-2 viral copies this assay can detect is 138 copies/mL. A negative result  does not preclude SARS-Cov-2 infection and should not be used as the sole basis for treatment or other patient management decisions. A negative result may occur with  improper specimen collection/handling, submission of specimen other than nasopharyngeal swab, presence of viral mutation(s) within the areas targeted by this assay, and inadequate number of viral copies(<138 copies/mL). A negative result must be combined with clinical observations, patient history, and epidemiological information. The expected result is Negative.  Fact Sheet for Patients:  BloggerCourse.comhttps://www.fda.gov/media/152166/download  Fact Sheet for Healthcare Providers:  SeriousBroker.ithttps://www.fda.gov/media/152162/download  This test is no t yet approved or cleared by the [place] FDA and  has been authorized for detection and/or diagnosis of SARS-CoV-2 by FDA under an Emergency Use Authorization (EUA). This EUA will remain  in effect (meaning this test can be used) for the duration of the COVID-19 declaration under Section 564(b)(1) of the Act, 21 U.S.C.section 360bbb-3(b)(1), unless the authorization is terminated  or revoked sooner.       Influenza A by PCR NEGATIVE NEGATIVE Final   Influenza B by PCR NEGATIVE  NEGATIVE Final    Comment: (NOTE) The Xpert Xpress SARS-CoV-2/FLU/RSV plus assay is intended as an aid in the diagnosis of influenza from Nasopharyngeal swab specimens and should not be used as a sole basis for treatment. Nasal washings and aspirates are unacceptable for Xpert Xpress SARS-CoV-2/FLU/RSV testing.  Fact Sheet for Patients: BloggerCourse.com  Fact Sheet for Healthcare Providers: SeriousBroker.it  This test is not yet approved or cleared by the [place] FDA and has been authorized for detection and/or diagnosis of SARS-CoV-2 by FDA under an Emergency Use Authorization (EUA). This EUA will remain in effect (meaning this test can be used) for  the duration of the COVID-19 declaration under Section 564(b)(1) of the Act, 21 U.S.C. section 360bbb-3(b)(1), unless the authorization is terminated or revoked.  Performed at [facility], [address] [place], [place] [address]          Radiology Studies: No results found.      Scheduled Meds:  amitriptyline  25 mg Oral QHS   apixaban  5 mg Oral BID   flecainide  50 mg Oral Q12H   furosemide  20 mg Oral Daily   losartan  100 mg Oral Daily   And   hydrochlorothiazide  12.5 mg Oral Daily   levothyroxine  125 mcg Oral Q0600   multivitamin with minerals  1 tablet Oral Daily   pantoprazole  40 mg Oral Daily   pentosan polysulfate  100 mg Oral TID   pindolol  2.5 mg Oral BID   simvastatin  20 mg Oral QHS   Continuous Infusions:   LOS: 3 days    Time spent: 35 minutes    Tresa Moore, MD [facility]   If 7PM-7AM, please contact night-coverage  08/26/2021, 2:15 PM

## 2021-08-26 NOTE — Evaluation (Signed)
Occupational Therapy Evaluation Patient Details Name: Meagan Wilcox MRN: [id number] DOB: July 09, 1931 Today's Date: 08/26/2021   History of Present Illness Pt. is an 89yoF [facility] c fatigue, dyspnea. Pt found to be in AF c RVR, unable to take amiodarone anymore, pt underwent DC cardioversion on 1/11 c cardiology. PMH: HFpEF, AF on eliquis, GERD, HTN, HLD, hypoTSH.   Clinical Impression   Pt. Visiting with niece upon arrival. Pt. Presents with weakness, limited activity tolerance, and  limited functional mobility which limits her ability to complete basic ADL and IADL functioning.  Pt. Is HOH. Pt. Resides at home alone. Pt. Was independent with ADLs, and IADL functioning: including home management, medication management, and driving. Pt. Has had some assist with heavier house cleaning Recently family and friends have assisted with meals. Pt. Required frequent rest breaks, and fatigues with minimal activity. Pt. Has a history of left knee pain. Which was 5/10 during transfers. Pt. Required MinA for sit to stand with cues for hand placement, CGA with RW to [facility] HR  62 bpms, SO2 100 at rest,  after activitiy HR 61 bpms, with SO2 96.  Pt. Was able to preform LE dressing with min guard with rest breaks. Pt. Could benefit from OT services for ADL training, A/E training, and pt. Education about  energy conservation/work simplification, home modification, and DME. Pt. would benefit from SNF level of care upon discharge, with follow-up OT services.      Recommendations for follow up therapy are one component of a multi-disciplinary discharge planning process, led by the attending physician.  Recommendations may be updated based on patient status, additional functional criteria and insurance authorization.   Follow Up Recommendations  Skilled nursing-short term rehab (<3 hours/day)    Assistance Recommended at Discharge Set up Supervision/Assistance  Patient can  return home with the following A little help with walking and/or transfers;Assistance with cooking/housework;A little help with bathing/dressing/bathroom    Functional Status Assessment  Patient has had a recent decline in their functional status and demonstrates the ability to make significant improvements in function in a reasonable and predictable amount of time.  Equipment Recommendations  BSC/3in1    Recommendations for Other Services       Precautions / Restrictions Precautions Precautions: Fall      Mobility Bed Mobility Overal bed mobility: Independent             General bed mobility comments: To the EOB    Transfers Overall transfer level: Needs assistance Equipment used: Rolling walker (2 wheels) Transfers: Sit to/from Stand Sit to Stand: Min assist           General transfer comment: Pt. required cue and minA  for hand placement when perfroming sit to stand, and stand to sit. pt. required CGA and increased time to turn, and  [facility]      Balance Overall balance assessment: Modified Independent                                         ADL either performed or assessed with clinical judgement   ADL Overall ADL's : Needs assistance/impaired     Grooming: Independent;Set up;Standing;Minimal assistance           Upper Body Dressing : Independent   Lower Body Dressing: Min guard   Toilet Transfer: BSC/3in1;Minimal assistance  General ADL Comments: Limited activity tolerance     Vision Baseline Vision/History: 1 Wears glasses Patient Visual Report: No change from baseline       Perception     Praxis      Pertinent Vitals/Pain Pain Assessment: No/denies pain Pain Score: 5  Faces Pain Scale: Hurts even more Pain Location: left knee pain during transfers Pain Descriptors / Indicators: Aching;Sore Pain Intervention(s): Limited activity within patient's tolerance;Monitored during session      Hand Dominance Right   Extremity/Trunk Assessment Upper Extremity Assessment Upper Extremity Assessment: Generalized weakness   Lower Extremity Assessment Lower Extremity Assessment: Generalized weakness   Cervical / Trunk Assessment Cervical / Trunk Assessment: Kyphotic   Communication Communication Communication: No difficulties   Cognition Arousal/Alertness: Awake/alert Behavior During Therapy: WFL for tasks assessed/performed Overall Cognitive Status: Within Functional Limits for tasks assessed                                 General Comments: HOH     General Comments       Exercises Other Exercises O   Shoulder Instructions      Home Living Family/patient expects to be discharged to:: Private residence Living Arrangements: Alone Available Help at Discharge: Family;Friend(s) Type of Home: House Home Access: Level entry     Home Layout: One level     Bathroom Shower/Tub: Walk-in shower (4" step-over)   Firefighter: Standard     Home Equipment: Agricultural consultant (2 wheels);Cane - single point;Shower seat;Other (comment) (adhesive balance bars in the bathroom, and lift chair.)          Prior Functioning/Environment Prior Level of Function : Independent/Modified Independent             Mobility Comments: Ambulates around the house without an assistive device, uses a cane for outings. ADLs Comments: Supportive niece, family and friends provide meals, assist with cleaning, indepndent medication management, driving        OT Problem List: Decreased strength;Decreased activity tolerance      OT Treatment/Interventions: Self-care/ADL training;Energy conservation;Patient/family education;Therapeutic activities;Therapeutic exercise;DME and/or AE instruction    OT Goals(Current goals can be found in the care plan section) Acute Rehab OT Goals Patient Stated Goal: To return home OT Goal Formulation: With patient Time For Goal  Achievement: 09/16/21 Potential to Achieve Goals: Good  OT Frequency: Min 2X/week    Co-evaluation              AM-PAC OT "6 Clicks" Daily Activity     Outcome Measure Help from another person eating meals?: None Help from another person taking care of personal grooming?: A Little Help from another person toileting, which includes using toliet, bedpan, or urinal?: A Little Help from another person bathing (including washing, rinsing, drying)?: A Little Help from another person to put on and taking off regular upper body clothing?: A Little Help from another person to put on and taking off regular lower body clothing?: A Little 6 Click Score: 19   End of Session Equipment Utilized During Treatment: Gait belt  Activity Tolerance: Patient tolerated treatment well Patient left: in bed;with family/visitor present (At  EOB with PT arriving.)  OT Visit Diagnosis: Muscle weakness (generalized) (M62.81)                Time: 1420-1450 OT Time Calculation (min): 30 min Charges:  OT General Charges $OT Visit: 1 Visit OT Evaluation $OT Eval Moderate Complexity: 1  Mod  Olegario Messier, MS, OTR/L   Olegario Messier 08/26/2021, 4:54 PM

## 2021-08-26 NOTE — Anesthesia Postprocedure Evaluation (Signed)
Anesthesia Post Note  Patient: Meagan Wilcox  Procedure(s) Performed: CARDIOVERSION  Patient location during evaluation: PACU Anesthesia Type: General Level of consciousness: awake and alert Pain management: pain level controlled Vital Signs Assessment: post-procedure vital signs reviewed and stable Respiratory status: spontaneous breathing, nonlabored ventilation and respiratory function stable Cardiovascular status: blood pressure returned to baseline and stable Postop Assessment: no apparent nausea or vomiting Anesthetic complications: no   No notable events documented.   Last Vitals:  Vitals:   08/26/21 1625 08/26/21 1929  BP: (!) 110/49 103/85  Pulse: 61 63  Resp: 18 18  Temp: 36.6 C 36.5 C  SpO2: 97% 100%    Last Pain:  Vitals:   08/26/21 2100  TempSrc:   PainSc: 0-No pain                 [place] Ouch

## 2021-08-26 NOTE — Evaluation (Signed)
Physical Therapy Evaluation Patient Details Name: Meagan Wilcox MRN: [id number] DOB: [birth year] Today's Date: 08/26/2021  History of Present Illness  Meagan Wilcox is an 89yoF who comes to [facility] on 08/23/21 c fatigue, dyspnea. Pt found to be in AF c RVR, unable to take amiodarone anymore, pt underwent DC cardioversion on 1/11 c cardiology. PMH: HFpEF, AF on eliquis, GERD, HTN, HLD, hypoTSH.  Clinical Impression  Pt admitted c above Dx. Pt shows functional limitations due to the deficits listed below (see "PT Problem List"). Patient agreeable to PT evaluation. PLOF and home setup obtained. Pt able to perform transfers and AMB with greater than typical effort, more supportive DME, and poorer tolerance. Pt is deconditioned and limited in large by both pain and activity intolerance. Pt struggles to perform a single bout of limited household distance AMB this date, would not be able to perform repeated AMB in home for ADL performance. Patient's assessment reveals acute need for additional person for safety and/or physical assistance to complete their typical ADL. At baseline, the patient is able to perform ADL with modified independence. Patient will benefit from skilled PT intervention to maximize independence and safety in mobility required for basic ADL performance at discharge.          Recommendations for follow up therapy are one component of a multi-disciplinary discharge planning process, led by the attending physician.  Recommendations may be updated based on patient status, additional functional criteria and insurance authorization.  Follow Up Recommendations Skilled nursing-short term rehab (<3 hours/day)    Assistance Recommended at Discharge Intermittent Supervision/Assistance  Patient can return home with the following  A little help with walking and/or transfers;A little help with bathing/dressing/bathroom;Assistance with cooking/housework;Assist for transportation    Equipment  Recommendations BSC/3in1  Recommendations for Other Services       Functional Status Assessment Patient has had a recent decline in their functional status and demonstrates the ability to make significant improvements in function in a reasonable and predictable amount of time.     Precautions / Restrictions Precautions Precautions: Fall      Mobility  Bed Mobility               General bed mobility comments: EOB at entry    Transfers Overall transfer level: Needs assistance Equipment used: Rolling walker (2 wheels) Transfers: Sit to/from Stand Sit to Stand: Min assist           General transfer comment: can perform from elevated surface with RW and supervision (has a lift chair at home)    Ambulation/Gait Ambulation/Gait assistance: Min guard Gait Distance (Feet): 50 Feet Assistive device: Rolling walker (2 wheels) Gait Pattern/deviations: Step-to pattern Gait velocity: 0.52m/s     General Gait Details: HR remains in 60s bpm, pt fatigue in arms, as well as globally; Knee pain very limiting in AMB with pronounced antalgia over latter 50% of distance.  Stairs            Wheelchair Mobility    Modified Rankin (Stroke Patients Only)       Balance Overall balance assessment: Modified Independent (sustained standing in session multiple times, no abnormal sway or falls anxiety durign standing BP assessment.)                                           Pertinent Vitals/Pain Pain Assessment: Faces Faces Pain Scale: Hurts even more Pain  Location: bilat knee pain in weight bearing in session, no pain at rest Pain Intervention(s): Limited activity within patient's tolerance;Monitored during session;Repositioned    Home Living Family/patient expects to be discharged to:: Private residence Living Arrangements: Alone Available Help at Discharge: Family;Friend(s) Type of Home: House Home Access: Level entry       Home Layout: One  level Home Equipment: Agricultural consultant (2 wheels);Cane - single point;Shower seat      Prior Function               Mobility Comments: chronic knee pain limitations, household AMB at baseline, SPC use, daily walks to [employer].       Hand Dominance        Extremity/Trunk Assessment   Upper Extremity Assessment Upper Extremity Assessment: Generalized weakness    Lower Extremity Assessment Lower Extremity Assessment: Generalized weakness    Cervical / Trunk Assessment Cervical / Trunk Assessment: Kyphotic  Communication      Cognition Arousal/Alertness: Awake/alert Behavior During Therapy: WFL for tasks assessed/performed Overall Cognitive Status: Within Functional Limits for tasks assessed                                          General Comments      Exercises Other Exercises Other Exercises: seated LAQ LLE 1x15 (lacks full extension by >20 degrees, albeit cannot r/o hamstring stightness) Other Exercises: STS from elevated surface 3x   Assessment/Plan    PT Assessment Patient needs continued PT services  PT Problem List Decreased strength;Decreased activity tolerance;Decreased balance;Decreased mobility;Cardiopulmonary status limiting activity       PT Treatment Interventions DME instruction;Balance training;Gait training;Stair training;Functional mobility training;Therapeutic activities;Therapeutic exercise;Patient/family education    PT Goals (Current goals can be found in the Care Plan section)  Acute Rehab PT Goals Patient Stated Goal: regain independence in mobility PT Goal Formulation: With patient Time For Goal Achievement: 09/09/21 Potential to Achieve Goals: Good    Frequency Min 2X/week     Co-evaluation               AM-PAC PT "6 Clicks" Mobility  Outcome Measure Help needed turning from your back to your side while in a flat bed without using bedrails?: A Little Help needed moving from lying on your back to sitting  on the side of a flat bed without using bedrails?: A Little Help needed moving to and from a bed to a chair (including a wheelchair)?: A Lot Help needed standing up from a chair using your arms (e.g., wheelchair or bedside chair)?: A Lot Help needed to walk in hospital room?: A Little Help needed climbing 3-5 steps with a railing? : A Lot 6 Click Score: 15    End of Session Equipment Utilized During Treatment: Gait belt Activity Tolerance: Patient tolerated treatment well;Patient limited by fatigue;Patient limited by pain Patient left: in bed;with family/visitor present;with call bell/phone within reach Nurse Communication: Mobility status PT Visit Diagnosis: Difficulty in walking, not elsewhere classified (R26.2);Muscle weakness (generalized) (M62.81)    Time: 4268-3419 PT Time Calculation (min) (ACUTE ONLY): 34 min   Charges:   PT Evaluation $PT Eval Moderate Complexity: 1 Mod PT Treatments $Therapeutic Exercise: 8-22 mins       3:56 PM, 08/26/21 Rosamaria Lints, PT, DPT Physical Therapist - [facility]  [phone] (ASCOM)    Williom Cedar C 08/26/2021, 3:54 PM

## 2021-08-26 NOTE — Progress Notes (Signed)
Nutrition Brief Note  Patient identified on the Malnutrition Screening Tool (MST) Report  Wt Readings from Last 15 Encounters:  08/25/21 71.7 kg  07/14/21 73.5 kg  05/13/21 73.5 kg  04/07/21 73 kg  03/03/21 74.4 kg  12/30/20 74.4 kg  07/01/20 75.3 kg  06/25/20 75 kg  03/24/20 75 kg  02/28/20 75.5 kg  01/29/20 76.3 kg  12/23/19 76.3 kg  10/28/19 75.8 kg  10/22/19 76.9 kg  07/19/19 75.1 kg   Meagan Wilcox is a 88 y.o. Caucasian female with medical history significant for HFpAF, paroxysmal atrial fibrillation on Eliquis, GERD, hypertension, dyslipidemia and hypothyroidism, who presented to the ER with acute onset of generalized fatigue and dyspnea with worsening dyspnea over the last several days and associated palpitations.  She denies any chest pain or nausea or vomiting or diaphoresis.  Her heart rate has been elevated at home in the 110s.  No cough or wheezing or hemoptysis.  No leg pain or edema or recent travels or surgeries.  No dysuria, oliguria or hematuria or flank pain.  No bleeding diathesis.  She was taken off amiodarone on 03/03/2021 by Dr. Saunders Revel given frequent PVCs and bradycardia.  She denies any orthopnea or paroxysmal nocturnal dyspnea.  She has been having mild dyspnea on exertion.  Pt admitted with a-fib.   Current diet order is Heart Healthy, patient is consuming approximately 80-100% of meals at this time. Labs and medications reviewed.   No nutrition interventions warranted at this time. If nutrition issues arise, please consult RD.   Loistine Chance, RD, LDN, [place] Registered Dietitian II Certified Diabetes Care and Education Specialist Please refer to [facility] for RD and/or RD on-call/weekend/after hours pager

## 2021-08-26 NOTE — Progress Notes (Addendum)
Progress Note  Patient Name: Meagan Wilcox Date of Encounter: 08/26/2021  [facility] Cardiologist: Yvonne Kendall, MD   Subjective   Reports feeling well, just weak Working with PT Cardioversion yesterday successful, has maintained normal sinus rhythm 24 hours As outpatient followed by EP, on flecainide 50 twice daily, pindolol 2.5 daily  Appetite poor, losing weight  Inpatient Medications    Scheduled Meds:  amitriptyline  25 mg Oral QHS   apixaban  5 mg Oral BID   flecainide  50 mg Oral Q12H   furosemide  20 mg Oral Daily   losartan  100 mg Oral Daily   And   hydrochlorothiazide  12.5 mg Oral Daily   levothyroxine  125 mcg Oral Q0600   multivitamin with minerals  1 tablet Oral Daily   pantoprazole  40 mg Oral Daily   pentosan polysulfate  100 mg Oral TID   pindolol  2.5 mg Oral BID   simvastatin  20 mg Oral QHS   Continuous Infusions:  PRN Meds: acetaminophen, magnesium hydroxide, ondansetron (ZOFRAN) IV   Vital Signs    Vitals:   08/26/21 0511 08/26/21 0738 08/26/21 1137 08/26/21 1515  BP: (!) 120/51 (!) 127/53 (!) 120/45 (!) 104/40  Pulse: 61 63 (!) 59 67  Resp: 17 16 18    Temp: 98.5 F (36.9 C) 98.2 F (36.8 C) 98.1 F (36.7 C)   TempSrc: Oral Oral Oral   SpO2: 96% 96% 98%   Weight:        Intake/Output Summary (Last 24 hours) at 08/26/2021 1608 Last data filed at 08/26/2021 1350 Gross per 24 hour  Intake 1162.45 ml  Output --  Net 1162.45 ml   Last 3 Weights 08/25/2021 07/14/2021 05/13/2021  Weight (lbs) 158 lb 162 lb 162 lb  Weight (kg) 71.668 kg 73.483 kg 73.483 kg      Telemetry    Normal sinus rhythm- Personally Reviewed  ECG     - Personally Reviewed  Physical Exam   GEN: No acute distress.  Thin, frail Neck: No JVD Cardiac: RRR, no murmurs, rubs, or gallops.  Respiratory: Clear to auscultation bilaterally. GI: Soft, nontender, non-distended  MS: No edema; No deformity. Neuro:  Nonfocal  Psych: Normal affect   Labs     High Sensitivity Troponin:   Recent Labs  Lab 08/23/21 1145 08/23/21 1444  TROPONINIHS 37* 49*     Chemistry Recent Labs  Lab 08/23/21 1145 08/23/21 1444 08/24/21 0643 08/26/21 0804  NA 135  --  135 135  K 3.6  --  4.5 4.4  CL 101  --  99 99  CO2 23  --  27 27  GLUCOSE 116*  --  121* 117*  BUN 16  --  19 24*  CREATININE 1.16*  --  1.13* 1.14*  CALCIUM 9.8  --  9.5 9.6  MG  --  2.1  --  2.0  GFRNONAA 45*  --  47* 46*  ANIONGAP 11  --  9 9    Lipids No results for input(s): CHOL, TRIG, HDL, LABVLDL, LDLCALC, CHOLHDL in the last 168 hours.  Hematology Recent Labs  Lab 08/23/21 1145 08/24/21 0643 08/26/21 0804  WBC 7.2 6.7 8.2  RBC 4.28 4.20 3.88  HGB 13.4 13.2 12.2  HCT 39.3 38.7 36.0  MCV 91.8 92.1 92.8  MCH 31.3 31.4 31.4  MCHC 34.1 34.1 33.9  RDW 13.6 13.8 13.9  PLT 257 263 243   Thyroid  Recent Labs  Lab 08/23/21 1444  TSH 1.361    BNP Recent Labs  Lab 08/23/21 1149  BNP 292.7*    DDimer  Recent Labs  Lab 08/24/21 0815  DDIMER <0.27     Radiology    No results found.  Cardiac Studies     Patient Profile     88 y.o. female with hx of afib/flutter s/p DCCV x 2, htn, hfpef presenting with sob and fatigue being seen for aflutter with rapid ventricular response Prior use of amiodarone for atrial fibrillation, held in the setting of bradycardia  Assessment & Plan    Atrial flutter Cardioverted yesterday, maintaining normal sinus rhythm As outpatient followed by EP, has been started on flecainide 50 twice daily with pindolol 2.5 twice daily On Eliquis  Essential hypertension Pindolol, losartan HCT, amlodipine Blood pressure 120 systolic  Hyperlipidemia On statin, simvastatin  History of diastolic CHF This admission treated with IV Lasix,  transition to oral Lasix 20 at the time of discharge  Case discussed with hospitalist service  Total encounter time more than 35 minutes  Greater than 50% was spent in counseling and  coordination of care with the patient  [facility] will sign off.   Medication Recommendations: Lasix 20 daily as needed Other recommendations (labs, testing, etc): No further testing Follow up as an outpatient: Follow-up with EP, Dr. Graciela Husbands  For questions or updates, please contact [facility] Please consult [email or web address] for contact info under        Signed, Julien Nordmann, MD  08/26/2021, 4:08 PM

## 2021-08-26 NOTE — Care Management Important Message (Signed)
Important Message  Patient Details  Name: Meagan Wilcox MRN: [id number] Date of Birth: 14-Nov-1930   Medicare Important Message Given:  Yes     [name] 08/26/2021, 12:58 PM

## 2021-08-26 NOTE — Progress Notes (Signed)
Mobility Specialist - Progress Note   08/26/21 1500  Mobility  Mobility performed by Mobility specialist     Pt in session with PT at time of attempt. Will attempt session another date/time.    Kathee Delton Mobility Specialist 08/26/21, 3:24 PM

## 2021-08-27 MED ORDER — MELATONIN 5 MG PO TABS
5.0000 mg | ORAL_TABLET | Freq: Every day | ORAL | Status: DC
  Administered 2021-08-27 – 2021-08-30 (×4): 5 mg via ORAL
  Filled 2021-08-27 (×4): qty 1

## 2021-08-27 NOTE — Progress Notes (Signed)
Physical Therapy Treatment Patient Details Name: Meagan Wilcox MRN: [id number] DOB: [birth year] Today's Date: 08/27/2021   History of Present Illness Pt. is an 89yoF who comes to [facility] on 08/23/21 c fatigue, dyspnea. Pt found to be in AF c RVR, unable to take amiodarone anymore, pt underwent DC cardioversion on 1/11 c cardiology. PMH: HFpEF, AF on eliquis, GERD, HTN, HLD, hypoTSH.    PT Comments    Pt in bed resting agreeable and motivated to participate, continued with  ROM of Left knee prior to session to address acute on chronic DJD stiffness and pain. Pt advances to 2 sets of 5xSTS with less pain still requires surfce elevation. AMB in hall today, remains very slow, but less limited by knee pain.Persistent bradycardia with exertion is limiting and concerning, pt progressively appearing less alert and having more postural fatigue and decreased motor control with sustained AMB, slurred words even, resolution with return to sitting and rest. Author expressed concerns of insufficient cardiac output to attending, concerns of increased falls risk and risk of LOC with sustained upright exertion. MD is aware.    Recommendations for follow up therapy are one component of a multi-disciplinary discharge planning process, led by the attending physician.  Recommendations may be updated based on patient status, additional functional criteria and insurance authorization.  Follow Up Recommendations  Skilled nursing-short term rehab (<3 hours/day)     Assistance Recommended at Discharge Intermittent Supervision/Assistance  Patient can return home with the following A little help with walking and/or transfers;A little help with bathing/dressing/bathroom;Assistance with cooking/housework;Assist for transportation   Equipment Recommendations  BSC/3in1    Recommendations for Other Services       Precautions / Restrictions Precautions Precautions: Fall Precaution Comments: persistent  bradycardia Restrictions Weight Bearing Restrictions: No     Mobility  Bed Mobility Overal bed mobility: Needs Assistance Bed Mobility: Supine to Sit;Sit to Supine     Supine to sit: Supervision Sit to supine: Min assist        Transfers Overall transfer level: Needs assistance Equipment used: Rolling walker (2 wheels) Transfers: Sit to/from Stand Sit to Stand: Min guard                Ambulation/Gait Ambulation/Gait assistance: Min guard (chair follow) Gait Distance (Feet): 75 Feet (54ft, 34ft (both of which have terminal rates in low 50s, declining alertness)) Assistive device: Rolling walker (2 wheels) Gait Pattern/deviations: Step-to pattern Gait velocity: today 0.66m/s over 70ft, 0.75m/s over 40ft; 0.71m/s on 1/12     General Gait Details: HR response to activity much more blunted today, knee pain less limiting. AMB remains very slow.   Stairs             Wheelchair Mobility    Modified Rankin (Stroke Patients Only)       Balance                                            Cognition Arousal/Alertness: Lethargic;Suspect due to medications Behavior During Therapy: [facility] for tasks assessed/performed Overall Cognitive Status: Within Functional Limits for tasks assessed                                          Exercises Other Exercises Other Exercises: SAQ 1x15 bilat in supine (lacks >20 degrees  on LLE (chronic) Other Exercises: 5xSTS from EOB twice from elevated surface (SOB there after, rates in 50s)    General Comments        Pertinent Vitals/Pain Pain Assessment: Faces Faces Pain Scale: Hurts even more Pain Location: left knee pain during transfers    Home Living                          Prior Function            PT Goals (current goals can now be found in the care plan section) Acute Rehab PT Goals Patient Stated Goal: regain independence in mobility PT Goal Formulation: With  patient Time For Goal Achievement: 09/09/21 Potential to Achieve Goals: Good Progress towards PT goals: Not progressing toward goals - comment    Frequency    Min 2X/week      PT Plan Current plan remains appropriate    Co-evaluation              AM-PAC PT "6 Clicks" Mobility   Outcome Measure  Help needed turning from your back to your side while in a flat bed without using bedrails?: A Little Help needed moving from lying on your back to sitting on the side of a flat bed without using bedrails?: A Little Help needed moving to and from a bed to a chair (including a wheelchair)?: A Lot Help needed standing up from a chair using your arms (e.g., wheelchair or bedside chair)?: A Lot Help needed to walk in hospital room?: A Little Help needed climbing 3-5 steps with a railing? : A Lot 6 Click Score: 15    End of Session Equipment Utilized During Treatment: Gait belt (chair follow) Activity Tolerance: Patient limited by fatigue;Patient limited by lethargy Patient left: in bed;with call bell/phone within reach;with bed alarm set Nurse Communication: Mobility status PT Visit Diagnosis: Difficulty in walking, not elsewhere classified (R26.2);Muscle weakness (generalized) (M62.81)     Time: 6546-5035 PT Time Calculation (min) (ACUTE ONLY): 47 min  Charges:  $Therapeutic Exercise: 38-52 mins                    2:40 PM, 08/27/21 Rosamaria Lints, PT, DPT Physical Therapist - [facility]  [phone] (ASCOM)     Cruzita Lipa C 08/27/2021, 2:37 PM

## 2021-08-27 NOTE — TOC Initial Note (Signed)
Transition of Care [facility]) - Initial/Assessment Note    Patient Details  Name: Meagan Wilcox MRN: [id number] Date of Birth: 09/29/1930  [facility] CM/SW Contact:    Meagan Krabbe, LCSW Phone Number: 08/27/2021, 1:20 PM  Clinical Narrative: CSW received a message to call pt's Niece. CSW spoke with pt's Niece and she said pt's niece is her POA and gave CSW verbal permission to discuss anything with her (Niece). Per Pt's niece she has been in contact with pt regarding dc plan. Pts Niece states pt is agreeable as long as it is a good place. Pt's Niece states location of placement could be [place], [place], or [place] as family is spread out. CSW has emailed pts niece a list of facilities--email confirmed.  Pt's Niece to review list and call CSW back with choices.               Expected Discharge Plan: Skilled Nursing Facility Barriers to Discharge: Continued Medical Work up   Patient Goals and CMS Choice Patient states their goals for this hospitalization and ongoing recovery are:: for pt to get better   Choice offered to / list presented to :  (Pt's Neice)  Expected Discharge Plan and Services Expected Discharge Plan: Skilled Nursing Facility In-house Referral: Clinical Social Work     Living arrangements for the past 2 months: Single Family Home                                      Prior Living Arrangements/Services Living arrangements for the past 2 months: Single Family Home Lives with:: Self Patient language and need for interpreter reviewed:: Yes Do you feel safe going back to the place where you live?: Yes      Need for Family Participation in Patient Care: Yes (Comment) Care giver support system in place?: Yes (comment)   Criminal Activity/Legal Involvement Pertinent to Current Situation/Hospitalization: No - Comment as needed  Activities of Daily Living Home Assistive Devices/Equipment: Cane (specify quad or straight) ADL Screening (condition  at time of admission) Patient's cognitive ability adequate to safely complete daily activities?: Yes Is the patient deaf or have difficulty hearing?: Yes Does the patient have difficulty seeing, even when wearing glasses/contacts?: No Does the patient have difficulty concentrating, remembering, or making decisions?: No Patient able to express need for assistance with ADLs?: Yes Does the patient have difficulty dressing or bathing?: No Independently performs ADLs?: Yes (appropriate for developmental age) Does the patient have difficulty walking or climbing stairs?: Yes Weakness of Legs: Left Weakness of Arms/Hands: None  Permission Sought/Granted Permission sought to share information with : Family Supports Permission granted to share information with : Yes, Release of Information Signed  Share Information with NAME: Meagan Wilcox     Permission granted to share info w Relationship: neice     Emotional Assessment Appearance:: Appears stated age Attitude/Demeanor/Rapport: Engaged Affect (typically observed): Accepting Orientation: : Oriented to Self, Oriented to Place, Oriented to  Time, Oriented to Situation Alcohol / Substance Use: Not Applicable Psych Involvement: No (comment)  Admission diagnosis:  Atrial fibrillation with rapid ventricular response (HCC) [I48.91] Atrial fibrillation with RVR (HCC) [I48.91] Patient Active Problem List   Diagnosis Date Noted   Hypothyroidism    Atrial fibrillation with rapid ventricular response (HCC) 08/23/2021   PVC's (premature ventricular contractions) 03/04/2021   Bradycardia 03/04/2021   Persistent atrial fibrillation (HCC) 12/31/2020   Chronic heart  failure with preserved ejection fraction (HFpEF) (HCC) 12/31/2020   Hyperlipidemia 12/31/2020   Long term current use of amiodarone 07/02/2020   Annual physical exam 06/25/2020   Anxiety 03/24/2020   Fatigue 01/30/2020   Nausea 01/30/2020   Dyspnea on exertion 01/30/2020   Varicose veins of  leg with edema, bilateral 12/23/2019   Atrial flutter with rapid ventricular response (HCC) 05/07/2019   Lumbar radiculopathy 08/01/2017   Neuroforaminal stenosis of lumbar spine 08/01/2017   Lumbar degenerative disc disease 08/01/2017   Atypical ductal hyperplasia of left breast 08/31/2016   Essential hypertension    PCP:  Pcp, No Pharmacy:   [facility] [address] - [place], [place] - [address] [place] [address] Phone: [phone] Fax: [phone]  [id number] [place], [place] - [address] AT [facility] OF [address] [place] [address] [place] [address] Phone: [phone] Fax: [phone]     Social Determinants of Health (SDOH) Interventions    Readmission Risk Interventions No flowsheet data found.

## 2021-08-27 NOTE — Progress Notes (Signed)
PROGRESS NOTE    Meagan Wilcox  [id number] DOB: [birth year] DOA: 08/23/2021 PCP: Pcp, No    Brief Narrative:   88 y.o. female with a PMH significant for HFpEF, A. fib on Eliquis, GERD, HTN, HLD, hypothyroidism. They presented from home to the ED on 08/23/2021 with generalized fatigue and dyspnea dyspnea x several days. In the ED, it was found that they had Afib RVR. This is in setting of recent discontinuation of home amiodarone for side effect intolerance. They were treated with aspirin and IV cardizem gtt which improved her HR and symptoms. Cardiology was consulted.  Patient was admitted to medicine service for further workup and management of Afib RVR as outlined in detail below.  Patient underwent direct current cardioversion on 1/11.  Sinus rhythm restored  Post cardioversion patient remains in sinus rhythm.  Her main complaint is weakness.  Therapy has evaluated and recommended skilled nursing facility.  Pending placement   Assessment & Plan:   Principal Problem:   Atrial fibrillation with rapid ventricular response (HCC) Active Problems:   Atrial flutter with rapid ventricular response (HCC)  Atrial flutter with rapid ventricular response -Status post DCCV on 1/11 with restoration of sinus rhythm -Case discussed with cardiology Plan: Continue telemetry monitoring Pindolol 2.5 twice daily Flecainide 50 mg twice daily  Essential hypertension  well controlled - Beta-blocker as above  Hypothyroidism  TSH wnl on presentation - continue home synthroid   Dyslipidemia. - continue statin therapy.  History of interstitial cystitis. - continue Elmiron.  Weakness Functional decline Encourage p.o. nutrition Monitor and replace electrolytes as needed PT and OT evaluations Recommendation for skilled nursing facility Discharge pending placement   DVT prophylaxis: Apixaban Code Status: Full Family Communication: Niece via phone on 1/13 Disposition Plan: Status is:  Inpatient  Remains inpatient appropriate because: Atrial flutter status post DCCV.  Remains in sinus.  Monitor on telemetry.  Therapy evaluations.  Discharge planning.  Discharge anticipated 1/13       Level of care: Progressive  Consultants:  Cardiology Procedures:  DCCV 1/11  Antimicrobials: None   Subjective: Patient seen and examined.  Energy level appears improved this morning.  No complaints.  Feels well  Objective: Vitals:   08/27/21 0522 08/27/21 0756 08/27/21 1053 08/27/21 1140  BP: (!) 110/53 (!) 112/58 (!) 122/43 (!) 132/52  Pulse: (!) 54 (!) 58 (!) 55 (!) 52  Resp:  18 17 17   Temp: 98 F (36.7 C) 98 F (36.7 C) 98 F (36.7 C) 98 F (36.7 C)  TempSrc: Oral Oral Oral   SpO2: 98% 98% 98% 98%  Weight:        Intake/Output Summary (Last 24 hours) at 08/27/2021 1318 Last data filed at 08/27/2021 1249 Gross per 24 hour  Intake 1470 ml  Output 1250 ml  Net 220 ml   Filed Weights   08/25/21 0759  Weight: 71.7 kg    Examination:  General exam: No acute distress.  Sitting up in bed Respiratory system: Lungs clear.  Normal work of breathing.  Room air Cardiovascular system: S1-S2, RRR, no murmurs, no pedal edema Gastrointestinal system: Soft, NT/ND, normal bowel sounds Central nervous system: Alert and oriented. No focal neurological deficits. Extremities: Symmetric 5 x 5 power. Skin: No rashes, lesions or ulcers Psychiatry: Judgement and insight appear normal. Mood & affect appropriate.     Data Reviewed: I have personally reviewed following labs and imaging studies  CBC: Recent Labs  Lab 08/23/21 1145 08/24/21 0643 08/26/21 0804  WBC 7.2 6.7  8.2  NEUTROABS  --   --  4.1  HGB 13.4 13.2 12.2  HCT 39.3 38.7 36.0  MCV 91.8 92.1 92.8  PLT 257 263 0000000   Basic Metabolic Panel: Recent Labs  Lab 08/23/21 1145 08/23/21 1444 08/24/21 0643 08/26/21 0804  NA 135  --  135 135  K 3.6  --  4.5 4.4  CL 101  --  99 99  CO2 23  --  27 27  GLUCOSE  116*  --  121* 117*  BUN 16  --  19 24*  CREATININE 1.16*  --  1.13* 1.14*  CALCIUM 9.8  --  9.5 9.6  MG  --  2.1  --  2.0   GFR: Estimated Creatinine Clearance: 33.1 mL/min (A) (by C-G formula based on SCr of 1.14 mg/dL (H)). Liver Function Tests: No results for input(s): AST, ALT, ALKPHOS, BILITOT, PROT, ALBUMIN in the last 168 hours. No results for input(s): LIPASE, AMYLASE in the last 168 hours. No results for input(s): AMMONIA in the last 168 hours. Coagulation Profile: No results for input(s): INR, PROTIME in the last 168 hours. Cardiac Enzymes: No results for input(s): CKTOTAL, CKMB, CKMBINDEX, TROPONINI in the last 168 hours. BNP (last 3 results) No results for input(s): PROBNP in the last 8760 hours. HbA1C: No results for input(s): HGBA1C in the last 72 hours. CBG: No results for input(s): GLUCAP in the last 168 hours. Lipid Profile: No results for input(s): CHOL, HDL, LDLCALC, TRIG, CHOLHDL, LDLDIRECT in the last 72 hours. Thyroid Function Tests: No results for input(s): TSH, T4TOTAL, FREET4, T3FREE, THYROIDAB in the last 72 hours.  Anemia Panel: No results for input(s): VITAMINB12, FOLATE, FERRITIN, TIBC, IRON, RETICCTPCT in the last 72 hours. Sepsis Labs: No results for input(s): PROCALCITON, LATICACIDVEN in the last 168 hours.  Recent Results (from the past 240 hour(s))  Resp Panel by RT-PCR (Flu A&B, Covid) Nasopharyngeal Swab     Status: None   Collection Time: 08/23/21  2:44 PM   Specimen: Nasopharyngeal Swab; Nasopharyngeal(NP) swabs in vial transport medium  Result Value Ref Range Status   SARS Coronavirus 2 by RT PCR NEGATIVE NEGATIVE Final    Comment: (NOTE) SARS-CoV-2 target nucleic acids are NOT DETECTED.  The SARS-CoV-2 RNA is generally detectable in upper respiratory specimens during the acute phase of infection. The lowest concentration of SARS-CoV-2 viral copies this assay can detect is 138 copies/mL. A negative result does not preclude  SARS-Cov-2 infection and should not be used as the sole basis for treatment or other patient management decisions. A negative result may occur with  improper specimen collection/handling, submission of specimen other than nasopharyngeal swab, presence of viral mutation(s) within the areas targeted by this assay, and inadequate number of viral copies(<138 copies/mL). A negative result must be combined with clinical observations, patient history, and epidemiological information. The expected result is Negative.  Fact Sheet for Patients:  EntrepreneurPulse.com.au  Fact Sheet for Healthcare Providers:  IncredibleEmployment.be  This test is no t yet approved or cleared by the [place] FDA and  has been authorized for detection and/or diagnosis of SARS-CoV-2 by FDA under an Emergency Use Authorization (EUA). This EUA will remain  in effect (meaning this test can be used) for the duration of the COVID-19 declaration under Section 564(b)(1) of the Act, 21 U.S.C.section 360bbb-3(b)(1), unless the authorization is terminated  or revoked sooner.       Influenza A by PCR NEGATIVE NEGATIVE Final   Influenza B by PCR NEGATIVE NEGATIVE Final  Comment: (NOTE) The Xpert Xpress SARS-CoV-2/FLU/RSV plus assay is intended as an aid in the diagnosis of influenza from Nasopharyngeal swab specimens and should not be used as a sole basis for treatment. Nasal washings and aspirates are unacceptable for Xpert Xpress SARS-CoV-2/FLU/RSV testing.  Fact Sheet for Patients: EntrepreneurPulse.com.au  Fact Sheet for Healthcare Providers: IncredibleEmployment.be  This test is not yet approved or cleared by the [place] FDA and has been authorized for detection and/or diagnosis of SARS-CoV-2 by FDA under an Emergency Use Authorization (EUA). This EUA will remain in effect (meaning this test can be used) for the duration of  the COVID-19 declaration under Section 564(b)(1) of the Act, 21 U.S.C. section 360bbb-3(b)(1), unless the authorization is terminated or revoked.  Performed at [facility], [address], [place], [place] [address]          Radiology Studies: No results found.      Scheduled Meds:  amitriptyline  25 mg Oral QHS   apixaban  5 mg Oral BID   flecainide  50 mg Oral Q12H   furosemide  20 mg Oral Daily   losartan  100 mg Oral Daily   And   hydrochlorothiazide  12.5 mg Oral Daily   levothyroxine  125 mcg Oral Q0600   multivitamin with minerals  1 tablet Oral Daily   pantoprazole  40 mg Oral Daily   pentosan polysulfate  100 mg Oral TID   pindolol  2.5 mg Oral BID   simvastatin  20 mg Oral QHS   Continuous Infusions:   LOS: 4 days    Time spent: 35 minutes    Sidney Ace, MD [facility]   If 7PM-7AM, please contact night-coverage  08/27/2021, 1:18 PM

## 2021-08-27 NOTE — NC FL2 (Signed)
[place] MEDICAID FL2 LEVEL OF CARE SCREENING TOOL     IDENTIFICATION  Patient Name: Meagan Wilcox Birthdate: 06/14/1931 Sex: female Admission Date (Current Location): 08/23/2021  [facility] and [place] Number:  Chiropodist and Address:  [facility], [address], [place], [place] [address]      Provider Number: [id number]  Attending Physician Name and Address:  Tresa Moore, MD  Relative Name and Phone Number:       Current Level of Care: Hospital Recommended Level of Care: Skilled Nursing Facility Prior Approval Number:    Date Approved/Denied:   PASRR Number: [id number]  Discharge Plan: SNF    Current Diagnoses: Patient Active Problem List   Diagnosis Date Noted   Hypothyroidism    Atrial fibrillation with rapid ventricular response (HCC) 08/23/2021   PVC's (premature ventricular contractions) 03/04/2021   Bradycardia 03/04/2021   Persistent atrial fibrillation (HCC) 12/31/2020   Chronic heart failure with preserved ejection fraction (HFpEF) (HCC) 12/31/2020   Hyperlipidemia 12/31/2020   Long term current use of amiodarone 07/02/2020   Annual physical exam 06/25/2020   Anxiety 03/24/2020   Fatigue 01/30/2020   Nausea 01/30/2020   Dyspnea on exertion 01/30/2020   Varicose veins of leg with edema, bilateral 12/23/2019   Atrial flutter with rapid ventricular response (HCC) 05/07/2019   Lumbar radiculopathy 08/01/2017   Neuroforaminal stenosis of lumbar spine 08/01/2017   Lumbar degenerative disc disease 08/01/2017   Atypical ductal hyperplasia of left breast 08/31/2016   Essential hypertension     Orientation RESPIRATION BLADDER Height & Weight     Self, Time, Situation, Place  Normal Continent Weight: 158 lb (71.7 kg) Height:     BEHAVIORAL SYMPTOMS/MOOD NEUROLOGICAL BOWEL NUTRITION STATUS      Continent Diet (heart healthy, thin liquids)  AMBULATORY STATUS COMMUNICATION OF NEEDS Skin   Limited Assist Verbally  Normal                       Personal Care Assistance Level of Assistance  Dressing, Feeding, Bathing Bathing Assistance: Limited assistance Feeding assistance: Independent Dressing Assistance: Limited assistance     Functional Limitations Info  Sight, Hearing, Speech Sight Info: Adequate Hearing Info: Adequate Speech Info: Adequate    SPECIAL CARE FACTORS FREQUENCY  OT (By licensed OT), PT (By licensed PT)     PT Frequency: 5x OT Frequency: 5x            Contractures Contractures Info: Not present    Additional Factors Info  Code Status, Allergies Code Status Info: full code Allergies Info: Amoxicillin, Penicillins           Current Medications (08/27/2021):  This is the current hospital active medication list Current Facility-Administered Medications  Medication Dose Route Frequency Provider Last Rate Last Admin   acetaminophen (TYLENOL) tablet 650 mg  650 mg Oral Q4H PRN Mansy, Jan A, MD   650 mg at 08/23/21 2014   amitriptyline (ELAVIL) tablet 25 mg  25 mg Oral QHS Lolita Patella B, MD   25 mg at 08/26/21 2051   apixaban (ELIQUIS) tablet 5 mg  5 mg Oral BID Mansy, Jan A, MD   5 mg at 08/27/21 1012   flecainide (TAMBOCOR) tablet 50 mg  50 mg Oral Q12H Duke Salvia, MD   50 mg at 08/27/21 1143   furosemide (LASIX) tablet 20 mg  20 mg Oral Daily Debbe Odea, MD   20 mg at 08/27/21 1012   losartan (  COZAAR) tablet 100 mg  100 mg Oral Daily Tressie Ellis, RPH   100 mg at 08/27/21 1145   And   hydrochlorothiazide (HYDRODIURIL) tablet 12.5 mg  12.5 mg Oral Daily Tressie Ellis, RPH   12.5 mg at 08/27/21 1145   levothyroxine (SYNTHROID) tablet 125 mcg  125 mcg Oral Q0600 Mansy, Jan A, MD   125 mcg at 08/27/21 0517   magnesium hydroxide (MILK OF MAGNESIA) suspension 30 mL  30 mL Oral Daily PRN Mansy, Jan A, MD       multivitamin with minerals tablet 1 tablet  1 tablet Oral Daily Mansy, Jan A, MD   1 tablet at 08/27/21 1013   ondansetron (ZOFRAN)  injection 4 mg  4 mg Intravenous Q6H PRN Mansy, Jan A, MD       pantoprazole (PROTONIX) EC tablet 40 mg  40 mg Oral Daily Mansy, Jan A, MD   40 mg at 08/27/21 1010   pentosan polysulfate (ELMIRON) capsule 100 mg  100 mg Oral TID Mansy, Jan A, MD   100 mg at 08/27/21 1014   pindolol (VISKEN) tablet 2.5 mg  2.5 mg Oral BID Debbe Odea, MD   2.5 mg at 08/27/21 1148   simvastatin (ZOCOR) tablet 20 mg  20 mg Oral QHS Mansy, Jan A, MD   20 mg at 08/26/21 2051     Discharge Medications: Please see discharge summary for a list of discharge medications.  Relevant Imaging Results:  Relevant Lab Results:   Additional Information SSN:[id number]  Reuel Boom Iyan Flett, LCSW

## 2021-08-28 LAB — RESP PANEL BY RT-PCR (FLU A&B, COVID) ARPGX2
Influenza A by PCR: NEGATIVE
Influenza B by PCR: NEGATIVE
SARS Coronavirus 2 by RT PCR: NEGATIVE

## 2021-08-28 NOTE — TOC Progression Note (Signed)
Transition of Care [facility]) - Progression Note    Patient Details  Name: Meagan Wilcox MRN: [id number] Date of Birth: 1931-05-28  [facility] CM/SW Contact  Maud Deed, LCSW Phone Number: 08/28/2021, 2:51 PM  Clinical Narrative:    CSW Spoke with pt's Niece Meagan Wilcox to obtain bed choice, They chose [facility]. CSW notified Verlon Au with [facility], started [employer]. and faxed clinicals. Ref # [id number].    Expected Discharge Plan: Skilled Nursing Facility Barriers to Discharge: Continued Medical Work up  Expected Discharge Plan and Services Expected Discharge Plan: Skilled Nursing Facility In-house Referral: Clinical Social Work     Living arrangements for the past 2 months: Single Family Home                                       Social Determinants of Health (SDOH) Interventions    Readmission Risk Interventions No flowsheet data found.

## 2021-08-28 NOTE — Progress Notes (Signed)
PROGRESS NOTE    Meagan Wilcox  [id number] DOB: 09-23-1930 DOA: 08/23/2021 PCP: Pcp, No    Brief Narrative:    88 y.o. female with a PMH significant for HFpEF, A. fib on Eliquis, GERD, HTN, HLD, hypothyroidism. They presented from home to the ED on 08/23/2021 with generalized fatigue and dyspnea dyspnea x several days. In the ED, it was found that they had Afib RVR. This is in setting of recent discontinuation of home amiodarone for side effect intolerance. They were treated with aspirin and IV cardizem gtt which improved her HR and symptoms. Cardiology was consulted.  Patient was admitted to medicine service for further workup and management of Afib RVR as outlined in detail below.   Patient underwent direct current cardioversion on 1/11.  Sinus rhythm restored   Post cardioversion patient remains in sinus rhythm.  Her main complaint is weakness.  Therapy has evaluated and recommended skilled nursing facility.  Pending placement   Assessment & Plan:   Principal Problem:   Atrial fibrillation with rapid ventricular response (HCC) Active Problems:   Atrial flutter with rapid ventricular response (HCC)  Atrial flutter with rapid ventricular response -Status post DCCV on 1/11 with restoration of sinus rhythm -Case discussed with cardiology Plan: Continue telemetry monitoring Pindolol 2.5 twice daily Flecainide 50 mg twice daily  Bradycardia Patient baseline brady while in NSR Plan: Continue pindolol with holding parameters Telemetry monitoring  Essential hypertension  well controlled - Beta-blocker as above  Hypothyroidism  TSH wnl on presentation - continue home synthroid   Dyslipidemia. - continue statin therapy.  History of interstitial cystitis. - continue Elmiron.   Weakness Functional decline Encourage p.o. nutrition Monitor and replace electrolytes as needed PT and OT evaluations Recommendation for skilled nursing facility Medically stable for  discharge   DVT prophylaxis: Apixaban Code Status: Full Family Communication: Niece at bedside 1/14 Disposition Plan: Status is: Inpatient  Remains inpatient appropriate because: Unsafe discharge plan.  Need skilled nursing facility.  Medically stable.       Level of care: Progressive  Consultants:  None, cardiology signed off  Procedures:  DCCV  Antimicrobials: None   Subjective: Seen and examined.  Sitting up in chair.  No visible distress.  Does endorse weakness.  Objective: Vitals:   08/27/21 2345 08/28/21 0417 08/28/21 0807 08/28/21 0859  BP: (!) 99/45 (!) 143/50 (!) 166/57 (!) 124/47  Pulse: (!) 55 (!) 51 (!) 53   Resp: 20 18 17    Temp: 98 F (36.7 C) 98.4 F (36.9 C) 97.9 F (36.6 C)   TempSrc: Oral Oral    SpO2: 95% 100% 100%   Weight:        Intake/Output Summary (Last 24 hours) at 08/28/2021 1149 Last data filed at 08/28/2021 0930 Gross per 24 hour  Intake 960 ml  Output 301 ml  Net 659 ml   Filed Weights   08/25/21 0759  Weight: 71.7 kg    Examination:  General exam: No acute distress.  Appears frail Respiratory system: Clear to auscultation. Respiratory effort normal. Cardiovascular system: S1-S2, bradycardic, regular rhythm, no murmurs, no pedal edema Gastrointestinal system: Abdomen is nondistended, soft and nontender. No organomegaly or masses felt. Normal bowel sounds heard. Central nervous system: Alert and oriented. No focal neurological deficits. Extremities: Symmetric 5 x 5 power. Skin: No rashes, lesions or ulcers Psychiatry: Judgement and insight appear normal. Mood & affect appropriate.     Data Reviewed: I have personally reviewed following labs and imaging studies  CBC: Recent Labs  Lab 08/23/21 1145 08/24/21 0643 08/26/21 0804  WBC 7.2 6.7 8.2  NEUTROABS  --   --  4.1  HGB 13.4 13.2 12.2  HCT 39.3 38.7 36.0  MCV 91.8 92.1 92.8  PLT 257 263 0000000   Basic Metabolic Panel: Recent Labs  Lab 08/23/21 1145  08/23/21 1444 08/24/21 0643 08/26/21 0804  NA 135  --  135 135  K 3.6  --  4.5 4.4  CL 101  --  99 99  CO2 23  --  27 27  GLUCOSE 116*  --  121* 117*  BUN 16  --  19 24*  CREATININE 1.16*  --  1.13* 1.14*  CALCIUM 9.8  --  9.5 9.6  MG  --  2.1  --  2.0   GFR: Estimated Creatinine Clearance: 33.1 mL/min (A) (by C-G formula based on SCr of 1.14 mg/dL (H)). Liver Function Tests: No results for input(s): AST, ALT, ALKPHOS, BILITOT, PROT, ALBUMIN in the last 168 hours. No results for input(s): LIPASE, AMYLASE in the last 168 hours. No results for input(s): AMMONIA in the last 168 hours. Coagulation Profile: No results for input(s): INR, PROTIME in the last 168 hours. Cardiac Enzymes: No results for input(s): CKTOTAL, CKMB, CKMBINDEX, TROPONINI in the last 168 hours. BNP (last 3 results) No results for input(s): PROBNP in the last 8760 hours. HbA1C: No results for input(s): HGBA1C in the last 72 hours. CBG: No results for input(s): GLUCAP in the last 168 hours. Lipid Profile: No results for input(s): CHOL, HDL, LDLCALC, TRIG, CHOLHDL, LDLDIRECT in the last 72 hours. Thyroid Function Tests: No results for input(s): TSH, T4TOTAL, FREET4, T3FREE, THYROIDAB in the last 72 hours. Anemia Panel: No results for input(s): VITAMINB12, FOLATE, FERRITIN, TIBC, IRON, RETICCTPCT in the last 72 hours. Sepsis Labs: No results for input(s): PROCALCITON, LATICACIDVEN in the last 168 hours.  Recent Results (from the past 240 hour(s))  Resp Panel by RT-PCR (Flu A&B, Covid) Nasopharyngeal Swab     Status: None   Collection Time: 08/23/21  2:44 PM   Specimen: Nasopharyngeal Swab; Nasopharyngeal(NP) swabs in vial transport medium  Result Value Ref Range Status   SARS Coronavirus 2 by RT PCR NEGATIVE NEGATIVE Final    Comment: (NOTE) SARS-CoV-2 target nucleic acids are NOT DETECTED.  The SARS-CoV-2 RNA is generally detectable in upper respiratory specimens during the acute phase of infection. The  lowest concentration of SARS-CoV-2 viral copies this assay can detect is 138 copies/mL. A negative result does not preclude SARS-Cov-2 infection and should not be used as the sole basis for treatment or other patient management decisions. A negative result may occur with  improper specimen collection/handling, submission of specimen other than nasopharyngeal swab, presence of viral mutation(s) within the areas targeted by this assay, and inadequate number of viral copies(<138 copies/mL). A negative result must be combined with clinical observations, patient history, and epidemiological information. The expected result is Negative.  Fact Sheet for Patients:  EntrepreneurPulse.com.au  Fact Sheet for Healthcare Providers:  IncredibleEmployment.be  This test is no t yet approved or cleared by the [place] FDA and  has been authorized for detection and/or diagnosis of SARS-CoV-2 by FDA under an Emergency Use Authorization (EUA). This EUA will remain  in effect (meaning this test can be used) for the duration of the COVID-19 declaration under Section 564(b)(1) of the Act, 21 U.S.C.section 360bbb-3(b)(1), unless the authorization is terminated  or revoked sooner.       Influenza A by PCR NEGATIVE NEGATIVE  Final   Influenza B by PCR NEGATIVE NEGATIVE Final    Comment: (NOTE) The Xpert Xpress SARS-CoV-2/FLU/RSV plus assay is intended as an aid in the diagnosis of influenza from Nasopharyngeal swab specimens and should not be used as a sole basis for treatment. Nasal washings and aspirates are unacceptable for Xpert Xpress SARS-CoV-2/FLU/RSV testing.  Fact Sheet for Patients: EntrepreneurPulse.com.au  Fact Sheet for Healthcare Providers: IncredibleEmployment.be  This test is not yet approved or cleared by the [place] FDA and has been authorized for detection and/or diagnosis of SARS-CoV-2 by FDA under  an Emergency Use Authorization (EUA). This EUA will remain in effect (meaning this test can be used) for the duration of the COVID-19 declaration under Section 564(b)(1) of the Act, 21 U.S.C. section 360bbb-3(b)(1), unless the authorization is terminated or revoked.  Performed at [facility], [address] [place], [place] [address]   Resp Panel by RT-PCR (Flu A&B, Covid) Nasopharyngeal Swab     Status: None   Collection Time: 08/28/21  9:11 AM   Specimen: Nasopharyngeal Swab; Nasopharyngeal(NP) swabs in vial transport medium  Result Value Ref Range Status   SARS Coronavirus 2 by RT PCR NEGATIVE NEGATIVE Final    Comment: (NOTE) SARS-CoV-2 target nucleic acids are NOT DETECTED.  The SARS-CoV-2 RNA is generally detectable in upper respiratory specimens during the acute phase of infection. The lowest concentration of SARS-CoV-2 viral copies this assay can detect is 138 copies/mL. A negative result does not preclude SARS-Cov-2 infection and should not be used as the sole basis for treatment or other patient management decisions. A negative result may occur with  improper specimen collection/handling, submission of specimen other than nasopharyngeal swab, presence of viral mutation(s) within the areas targeted by this assay, and inadequate number of viral copies(<138 copies/mL). A negative result must be combined with clinical observations, patient history, and epidemiological information. The expected result is Negative.  Fact Sheet for Patients:  EntrepreneurPulse.com.au  Fact Sheet for Healthcare Providers:  IncredibleEmployment.be  This test is no t yet approved or cleared by the [place] FDA and  has been authorized for detection and/or diagnosis of SARS-CoV-2 by FDA under an Emergency Use Authorization (EUA). This EUA will remain  in effect (meaning this test can be used) for the duration of the COVID-19 declaration under  Section 564(b)(1) of the Act, 21 U.S.C.section 360bbb-3(b)(1), unless the authorization is terminated  or revoked sooner.       Influenza A by PCR NEGATIVE NEGATIVE Final   Influenza B by PCR NEGATIVE NEGATIVE Final    Comment: (NOTE) The Xpert Xpress SARS-CoV-2/FLU/RSV plus assay is intended as an aid in the diagnosis of influenza from Nasopharyngeal swab specimens and should not be used as a sole basis for treatment. Nasal washings and aspirates are unacceptable for Xpert Xpress SARS-CoV-2/FLU/RSV testing.  Fact Sheet for Patients: EntrepreneurPulse.com.au  Fact Sheet for Healthcare Providers: IncredibleEmployment.be  This test is not yet approved or cleared by the [place] FDA and has been authorized for detection and/or diagnosis of SARS-CoV-2 by FDA under an Emergency Use Authorization (EUA). This EUA will remain in effect (meaning this test can be used) for the duration of the COVID-19 declaration under Section 564(b)(1) of the Act, 21 U.S.C. section 360bbb-3(b)(1), unless the authorization is terminated or revoked.  Performed at [facility], [address] [place], [place] [address]          Radiology Studies: No results found.      Scheduled Meds:  amitriptyline  25 mg Oral QHS   apixaban  5 mg Oral BID   flecainide  50 mg Oral Q12H   furosemide  20 mg Oral Daily   losartan  100 mg Oral Daily   And   hydrochlorothiazide  12.5 mg Oral Daily   levothyroxine  125 mcg Oral Q0600   melatonin  5 mg Oral QHS   multivitamin with minerals  1 tablet Oral Daily   pantoprazole  40 mg Oral Daily   pentosan polysulfate  100 mg Oral TID   pindolol  2.5 mg Oral BID   simvastatin  20 mg Oral QHS   Continuous Infusions:   LOS: 5 days    Time spent: 35 minutes    Sidney Ace, MD [facility]   If 7PM-7AM, please contact night-coverage  08/28/2021, 11:49 AM

## 2021-08-29 LAB — CBC
HCT: 31.3 % — ABNORMAL LOW (ref 36.0–46.0)
Hemoglobin: 10.5 g/dL — ABNORMAL LOW (ref 12.0–15.0)
MCH: 31.7 pg (ref 26.0–34.0)
MCHC: 33.5 g/dL (ref 30.0–36.0)
MCV: 94.6 fL (ref 80.0–100.0)
Platelets: 234 10*3/uL (ref 150–400)
RBC: 3.31 MIL/uL — ABNORMAL LOW (ref 3.87–5.11)
RDW: 14.1 % (ref 11.5–15.5)
WBC: 8 10*3/uL (ref 4.0–10.5)
nRBC: 0 % (ref 0.0–0.2)

## 2021-08-29 LAB — COMPREHENSIVE METABOLIC PANEL
ALT: 15 U/L (ref 0–44)
AST: 17 U/L (ref 15–41)
Albumin: 3.7 g/dL (ref 3.5–5.0)
Alkaline Phosphatase: 51 U/L (ref 38–126)
Anion gap: 8 (ref 5–15)
BUN: 25 mg/dL — ABNORMAL HIGH (ref 8–23)
CO2: 25 mmol/L (ref 22–32)
Calcium: 9.3 mg/dL (ref 8.9–10.3)
Chloride: 101 mmol/L (ref 98–111)
Creatinine, Ser: 0.99 mg/dL (ref 0.44–1.00)
GFR, Estimated: 55 mL/min — ABNORMAL LOW (ref >60)
Glucose, Bld: 127 mg/dL — ABNORMAL HIGH (ref 70–99)
Potassium: 3.9 mmol/L (ref 3.5–5.1)
Sodium: 134 mmol/L — ABNORMAL LOW (ref 135–145)
Total Bilirubin: 0.3 mg/dL (ref 0.3–1.2)
Total Protein: 6.7 g/dL (ref 6.5–8.1)

## 2021-08-29 LAB — MAGNESIUM: Magnesium: 1.9 mg/dL (ref 1.7–2.4)

## 2021-08-29 MED ORDER — MAGNESIUM SULFATE 2 GM/50ML IV SOLN
2.0000 g | Freq: Once | INTRAVENOUS | Status: AC
  Administered 2021-08-29: 2 g via INTRAVENOUS
  Filled 2021-08-29: qty 50

## 2021-08-29 MED ORDER — ALPRAZOLAM 0.25 MG PO TABS
0.2500 mg | ORAL_TABLET | Freq: Two times a day (BID) | ORAL | Status: DC | PRN
  Administered 2021-08-29: 0.25 mg via ORAL
  Filled 2021-08-29: qty 1

## 2021-08-29 MED ORDER — MELATONIN 5 MG PO TABS
5.0000 mg | ORAL_TABLET | Freq: Once | ORAL | Status: DC
  Filled 2021-08-29: qty 1

## 2021-08-29 MED ORDER — POTASSIUM CHLORIDE CRYS ER 20 MEQ PO TBCR
40.0000 meq | EXTENDED_RELEASE_TABLET | Freq: Once | ORAL | Status: AC
  Administered 2021-08-29: 40 meq via ORAL
  Filled 2021-08-29: qty 2

## 2021-08-29 NOTE — Progress Notes (Signed)
PROGRESS NOTE    Meagan Wilcox  ZOX:[id number] DOB: 09/27/1930 DOA: 08/23/2021 PCP: Pcp, No    Brief Narrative:    88 y.o. female with a PMH significant for HFpEF, A. fib on Eliquis, GERD, HTN, HLD, hypothyroidism. They presented from home to the ED on 08/23/2021 with generalized fatigue and dyspnea dyspnea x several days. In the ED, it was found that they had Afib RVR. This is in setting of recent discontinuation of home amiodarone for side effect intolerance. They were treated with aspirin and IV cardizem gtt which improved her HR and symptoms. Cardiology was consulted.  Patient was admitted to medicine service for further workup and management of Afib RVR as outlined in detail below.   Patient underwent direct current cardioversion on 1/11.  Sinus rhythm restored   Post cardioversion patient remains in sinus rhythm.  Her main complaint is weakness.  Therapy has evaluated and recommended skilled nursing facility.  Pending placement   Assessment & Plan:   Principal Problem:   Atrial fibrillation with rapid ventricular response (HCC) Active Problems:   Atrial flutter with rapid ventricular response (HCC)  Atrial flutter with rapid ventricular response -Status post DCCV on 1/11 with restoration of sinus rhythm -Case discussed with cardiology Plan: Continue telemetry monitoring Pindolol 2.5 twice daily Flecainide 50 mg twice daily Beta-blocker dosing has been limited by underlying bradycardia  Bradycardia Patient baseline brady while in NSR Plan: Continue pindolol with holding parameters Telemetry monitoring  Essential hypertension  well controlled - Beta-blocker as above  Hypothyroidism  TSH wnl on presentation - continue home synthroid   Dyslipidemia. - continue statin therapy.  History of interstitial cystitis. - continue Elmiron.   Weakness Functional decline Encourage p.o. nutrition Monitor and replace electrolytes as needed PT and OT  evaluations Recommendation for skilled nursing facility Medically stable for discharge   DVT prophylaxis: Apixaban Code Status: Full Family Communication: Niece at bedside 1/14 Disposition Plan: Status is: Inpatient  Remains inpatient appropriate because: Unsafe discharge plan.  Need skilled nursing facility.  Medically stable.       Level of care: Progressive  Consultants:  None, cardiology signed off  Procedures:  DCCV  Antimicrobials: None   Subjective: Seen and examined.  Sitting in bed eating breakfast.  Mildly tremulous.  Objective: Vitals:   08/28/21 1950 08/29/21 0009 08/29/21 0423 08/29/21 0756  BP: (!) 122/39 (!) 113/39 (!) 101/41 (!) 133/54  Pulse: 61 (!) 58 (!) 56 (!) 51  Resp: 17 18 18 16   Temp: 98.9 F (37.2 C) (!) 97.5 F (36.4 C) 98.5 F (36.9 C) 98.2 F (36.8 C)  TempSrc: Oral  Oral   SpO2: 96% 97% 94% 95%  Weight:        Intake/Output Summary (Last 24 hours) at 08/29/2021 1151 Last data filed at 08/29/2021 0950 Gross per 24 hour  Intake 840 ml  Output 450 ml  Net 390 ml   Filed Weights   08/25/21 0759  Weight: 71.7 kg    Examination:  General exam: No acute distress.  Appears frail.  Mildly tremulous Respiratory system: Clear to auscultation. Respiratory effort normal. Cardiovascular system: S1-S2, bradycardic, regular rhythm, no murmurs, no pedal edema Gastrointestinal system: Thin, soft, NT/ND, normal bowel sounds Central nervous system: Alert and oriented. No focal neurological deficits. Extremities: Symmetric 5 x 5 power. Skin: No rashes, lesions or ulcers Psychiatry: Judgement and insight appear normal. Mood & affect appropriate.     Data Reviewed: I have personally reviewed following labs and imaging studies  CBC:  Recent Labs  Lab 08/23/21 1145 08/24/21 0643 08/26/21 0804 08/29/21 0513  WBC 7.2 6.7 8.2 8.0  NEUTROABS  --   --  4.1  --   HGB 13.4 13.2 12.2 10.5*  HCT 39.3 38.7 36.0 31.3*  MCV 91.8 92.1 92.8  94.6  PLT 257 263 243 234   Basic Metabolic Panel: Recent Labs  Lab 08/23/21 1145 08/23/21 1444 08/24/21 0643 08/26/21 0804 08/29/21 0853  NA 135  --  135 135 134*  K 3.6  --  4.5 4.4 3.9  CL 101  --  99 99 101  CO2 23  --  27 27 25   GLUCOSE 116*  --  121* 117* 127*  BUN 16  --  19 24* 25*  CREATININE 1.16*  --  1.13* 1.14* 0.99  CALCIUM 9.8  --  9.5 9.6 9.3  MG  --  2.1  --  2.0 1.9   GFR: Estimated Creatinine Clearance: 38.1 mL/min (by C-G formula based on SCr of 0.99 mg/dL). Liver Function Tests: Recent Labs  Lab 08/29/21 0853  AST 17  ALT 15  ALKPHOS 51  BILITOT 0.3  PROT 6.7  ALBUMIN 3.7   No results for input(s): LIPASE, AMYLASE in the last 168 hours. No results for input(s): AMMONIA in the last 168 hours. Coagulation Profile: No results for input(s): INR, PROTIME in the last 168 hours. Cardiac Enzymes: No results for input(s): CKTOTAL, CKMB, CKMBINDEX, TROPONINI in the last 168 hours. BNP (last 3 results) No results for input(s): PROBNP in the last 8760 hours. HbA1C: No results for input(s): HGBA1C in the last 72 hours. CBG: No results for input(s): GLUCAP in the last 168 hours. Lipid Profile: No results for input(s): CHOL, HDL, LDLCALC, TRIG, CHOLHDL, LDLDIRECT in the last 72 hours. Thyroid Function Tests: No results for input(s): TSH, T4TOTAL, FREET4, T3FREE, THYROIDAB in the last 72 hours. Anemia Panel: No results for input(s): VITAMINB12, FOLATE, FERRITIN, TIBC, IRON, RETICCTPCT in the last 72 hours. Sepsis Labs: No results for input(s): PROCALCITON, LATICACIDVEN in the last 168 hours.  Recent Results (from the past 240 hour(s))  Resp Panel by RT-PCR (Flu A&B, Covid) Nasopharyngeal Swab     Status: None   Collection Time: 08/23/21  2:44 PM   Specimen: Nasopharyngeal Swab; Nasopharyngeal(NP) swabs in vial transport medium  Result Value Ref Range Status   SARS Coronavirus 2 by RT PCR NEGATIVE NEGATIVE Final    Comment: (NOTE) SARS-CoV-2 target  nucleic acids are NOT DETECTED.  The SARS-CoV-2 RNA is generally detectable in upper respiratory specimens during the acute phase of infection. The lowest concentration of SARS-CoV-2 viral copies this assay can detect is 138 copies/mL. A negative result does not preclude SARS-Cov-2 infection and should not be used as the sole basis for treatment or other patient management decisions. A negative result may occur with  improper specimen collection/handling, submission of specimen other than nasopharyngeal swab, presence of viral mutation(s) within the areas targeted by this assay, and inadequate number of viral copies(<138 copies/mL). A negative result must be combined with clinical observations, patient history, and epidemiological information. The expected result is Negative.  Fact Sheet for Patients:  10/21/21  Fact Sheet for Healthcare Providers:  BloggerCourse.com  This test is no t yet approved or cleared by the [place] FDA and  has been authorized for detection and/or diagnosis of SARS-CoV-2 by FDA under an Emergency Use Authorization (EUA). This EUA will remain  in effect (meaning this test can be used) for the duration of  the COVID-19 declaration under Section 564(b)(1) of the Act, 21 U.S.C.section 360bbb-3(b)(1), unless the authorization is terminated  or revoked sooner.       Influenza A by PCR NEGATIVE NEGATIVE Final   Influenza B by PCR NEGATIVE NEGATIVE Final    Comment: (NOTE) The Xpert Xpress SARS-CoV-2/FLU/RSV plus assay is intended as an aid in the diagnosis of influenza from Nasopharyngeal swab specimens and should not be used as a sole basis for treatment. Nasal washings and aspirates are unacceptable for Xpert Xpress SARS-CoV-2/FLU/RSV testing.  Fact Sheet for Patients: BloggerCourse.com  Fact Sheet for Healthcare  Providers: SeriousBroker.it  This test is not yet approved or cleared by the [place] FDA and has been authorized for detection and/or diagnosis of SARS-CoV-2 by FDA under an Emergency Use Authorization (EUA). This EUA will remain in effect (meaning this test can be used) for the duration of the COVID-19 declaration under Section 564(b)(1) of the Act, 21 U.S.C. section 360bbb-3(b)(1), unless the authorization is terminated or revoked.  Performed at [facility], [address] [place], [place] [address]   Resp Panel by RT-PCR (Flu A&B, Covid) Nasopharyngeal Swab     Status: None   Collection Time: 08/28/21  9:11 AM   Specimen: Nasopharyngeal Swab; Nasopharyngeal(NP) swabs in vial transport medium  Result Value Ref Range Status   SARS Coronavirus 2 by RT PCR NEGATIVE NEGATIVE Final    Comment: (NOTE) SARS-CoV-2 target nucleic acids are NOT DETECTED.  The SARS-CoV-2 RNA is generally detectable in upper respiratory specimens during the acute phase of infection. The lowest concentration of SARS-CoV-2 viral copies this assay can detect is 138 copies/mL. A negative result does not preclude SARS-Cov-2 infection and should not be used as the sole basis for treatment or other patient management decisions. A negative result may occur with  improper specimen collection/handling, submission of specimen other than nasopharyngeal swab, presence of viral mutation(s) within the areas targeted by this assay, and inadequate number of viral copies(<138 copies/mL). A negative result must be combined with clinical observations, patient history, and epidemiological information. The expected result is Negative.  Fact Sheet for Patients:  BloggerCourse.com  Fact Sheet for Healthcare Providers:  SeriousBroker.it  This test is no t yet approved or cleared by the [place] FDA and  has been authorized for  detection and/or diagnosis of SARS-CoV-2 by FDA under an Emergency Use Authorization (EUA). This EUA will remain  in effect (meaning this test can be used) for the duration of the COVID-19 declaration under Section 564(b)(1) of the Act, 21 U.S.C.section 360bbb-3(b)(1), unless the authorization is terminated  or revoked sooner.       Influenza A by PCR NEGATIVE NEGATIVE Final   Influenza B by PCR NEGATIVE NEGATIVE Final    Comment: (NOTE) The Xpert Xpress SARS-CoV-2/FLU/RSV plus assay is intended as an aid in the diagnosis of influenza from Nasopharyngeal swab specimens and should not be used as a sole basis for treatment. Nasal washings and aspirates are unacceptable for Xpert Xpress SARS-CoV-2/FLU/RSV testing.  Fact Sheet for Patients: BloggerCourse.com  Fact Sheet for Healthcare Providers: SeriousBroker.it  This test is not yet approved or cleared by the [place] FDA and has been authorized for detection and/or diagnosis of SARS-CoV-2 by FDA under an Emergency Use Authorization (EUA). This EUA will remain in effect (meaning this test can be used) for the duration of the COVID-19 declaration under Section 564(b)(1) of the Act, 21 U.S.C. section 360bbb-3(b)(1), unless the authorization is terminated or revoked.  Performed at  [facility], [address] [place], [place] [address]          Radiology Studies: No results found.      Scheduled Meds:  amitriptyline  25 mg Oral QHS   apixaban  5 mg Oral BID   flecainide  50 mg Oral Q12H   furosemide  20 mg Oral Daily   losartan  100 mg Oral Daily   And   hydrochlorothiazide  12.5 mg Oral Daily   levothyroxine  125 mcg Oral Q0600   melatonin  5 mg Oral QHS   melatonin  5 mg Oral Once   multivitamin with minerals  1 tablet Oral Daily   pantoprazole  40 mg Oral Daily   pentosan polysulfate  100 mg Oral TID   pindolol  2.5 mg Oral BID   potassium  chloride  40 mEq Oral Once   simvastatin  20 mg Oral QHS   Continuous Infusions:  magnesium sulfate bolus IVPB       LOS: 6 days    Time spent: 25 minutes    Tresa MooreSudheer B Breton Berns, MD [facility]   If 7PM-7AM, please contact night-coverage  08/29/2021, 11:51 AM

## 2021-08-29 NOTE — TOC Progression Note (Signed)
Transition of Care [facility]) - Progression Note    Patient Details  Name: Meagan Wilcox MRN: [id number] Date of Birth: [birth year]  [facility]) CM/SW Contact  Gildardo Griffes, [place] Phone Number: 08/29/2021, 2:37 PM  Clinical Narrative:     CSW notes insurance authorization is still pending at this time for SNF.   Expected Discharge Plan: Skilled Nursing Facility Barriers to Discharge: Continued Medical Work up  Expected Discharge Plan and Services Expected Discharge Plan: Skilled Nursing Facility In-house Referral: Clinical Social Work     Living arrangements for the past 2 months: Single Family Home                                       Social Determinants of Health (SDOH) Interventions    Readmission Risk Interventions No flowsheet data found.

## 2021-08-30 LAB — CBC WITH DIFFERENTIAL/PLATELET
Abs Immature Granulocytes: 0.12 10*3/uL — ABNORMAL HIGH (ref 0.00–0.07)
Basophils Absolute: 0.1 10*3/uL (ref 0.0–0.1)
Basophils Relative: 1 %
Eosinophils Absolute: 0.2 10*3/uL (ref 0.0–0.5)
Eosinophils Relative: 2 %
HCT: 30.8 % — ABNORMAL LOW (ref 36.0–46.0)
Hemoglobin: 10.4 g/dL — ABNORMAL LOW (ref 12.0–15.0)
Immature Granulocytes: 2 %
Lymphocytes Relative: 47 %
Lymphs Abs: 3.7 10*3/uL (ref 0.7–4.0)
MCH: 31 pg (ref 26.0–34.0)
MCHC: 33.8 g/dL (ref 30.0–36.0)
MCV: 91.7 fL (ref 80.0–100.0)
Monocytes Absolute: 0.8 10*3/uL (ref 0.1–1.0)
Monocytes Relative: 10 %
Neutro Abs: 2.9 10*3/uL (ref 1.7–7.7)
Neutrophils Relative %: 38 %
Platelets: 238 10*3/uL (ref 150–400)
RBC: 3.36 MIL/uL — ABNORMAL LOW (ref 3.87–5.11)
RDW: 14.4 % (ref 11.5–15.5)
WBC: 7.8 10*3/uL (ref 4.0–10.5)
nRBC: 0 % (ref 0.0–0.2)

## 2021-08-30 LAB — BASIC METABOLIC PANEL
Anion gap: 8 (ref 5–15)
BUN: 27 mg/dL — ABNORMAL HIGH (ref 8–23)
CO2: 26 mmol/L (ref 22–32)
Calcium: 9.3 mg/dL (ref 8.9–10.3)
Chloride: 103 mmol/L (ref 98–111)
Creatinine, Ser: 0.87 mg/dL (ref 0.44–1.00)
GFR, Estimated: >60 mL/min (ref >60)
Glucose, Bld: 104 mg/dL — ABNORMAL HIGH (ref 70–99)
Potassium: 4.5 mmol/L (ref 3.5–5.1)
Sodium: 137 mmol/L (ref 135–145)

## 2021-08-30 NOTE — Progress Notes (Signed)
Occupational Therapy Treatment Patient Details Name: Meagan Wilcox MRN: [id number] DOB: October 14, 1930 Today's Date: 08/30/2021   History of present illness Pt. is an 89yoF who comes to [facility] on 08/23/21 c fatigue, dyspnea. Pt found to be in AF c RVR, unable to take amiodarone anymore, pt underwent DC cardioversion on 1/11 c cardiology. PMH: HFpEF, AF on eliquis, GERD, HTN, HLD, hypoTSH.   OT comments  Pt seen for OT tx. Pt received in recliner, pleasant, and agreeable. Pt eager to recover and be as independent as possible. Pt required SBA for ADL transfers + increased time/effort to perform endorsing her L>R knees were stiff. Pt negotiated obstacles in her room with the RW well and stood at the sink for grooming tasks, with intermittent UE support on the counter for stability. Pt denied SOB and over exertion throughout. HR at rest in high 40's to low 50's, up to low 60's with exertion. Will continue to progress.    Recommendations for follow up therapy are one component of a multi-disciplinary discharge planning process, led by the attending physician.  Recommendations may be updated based on patient status, additional functional criteria and insurance authorization.    Follow Up Recommendations  Skilled nursing-short term rehab (<3 hours/day)    Assistance Recommended at Discharge Set up Supervision/Assistance  Patient can return home with the following  A little help with walking and/or transfers;Assistance with cooking/housework;A little help with bathing/dressing/bathroom;Assist for transportation;Help with stairs or ramp for entrance   Equipment Recommendations  BSC/3in1    Recommendations for Other Services      Precautions / Restrictions Precautions Precautions: Fall Precaution Comments: persistent bradycardia Restrictions Weight Bearing Restrictions: No       Mobility Bed Mobility      General bed mobility comments: NT    Transfers Overall transfer level: Needs  assistance Equipment used: Rolling walker (2 wheels) Transfers: Sit to/from Stand Sit to Stand: Supervision;Min guard           General transfer comment: SBA, increased time/effort     Balance Overall balance assessment: Needs assistance Sitting-balance support: Feet supported Sitting balance-Leahy Scale: Good     Standing balance support: During functional activity;Single extremity supported;No upper extremity supported Standing balance-Leahy Scale: Fair                             ADL either performed or assessed with clinical judgement   ADL Overall ADL's : Needs assistance/impaired     Grooming: Standing;Supervision/safety;Oral care;Wash/dry face;Wash/dry hands Grooming Details (indicate cue type and reason): Pt brushed her teeth and flossed at the sink with intermittent UE support on the counter                                    Extremity/Trunk Assessment              Vision       Perception     Praxis      Cognition Arousal/Alertness: Awake/alert Behavior During Therapy: WFL for tasks assessed/performed Overall Cognitive Status: Within Functional Limits for tasks assessed                                 General Comments: [facility]          Exercises    Shoulder Instructions  General Comments      Pertinent Vitals/ Pain       Pain Assessment: No/denies pain Faces Pain Scale: Hurts little more Pain Location: left knee pain during mobility Pain Descriptors / Indicators: Discomfort;Sore;Grimacing;Guarding Pain Intervention(s): Limited activity within patient's tolerance;Monitored during session  Home Living                                          Prior Functioning/Environment              Frequency  Min 2X/week        Progress Toward Goals  OT Goals(current goals can now be found in the care plan section)  Progress towards OT goals: Progressing toward goals  Acute  Rehab OT Goals Patient Stated Goal: to return home OT Goal Formulation: With patient Time For Goal Achievement: 09/16/21 Potential to Achieve Goals: Good  Plan Discharge plan remains appropriate;Frequency remains appropriate    Co-evaluation                 AM-PAC OT "6 Clicks" Daily Activity     Outcome Measure   Help from another person eating meals?: None Help from another person taking care of personal grooming?: A Little Help from another person toileting, which includes using toliet, bedpan, or urinal?: A Little Help from another person bathing (including washing, rinsing, drying)?: A Little Help from another person to put on and taking off regular upper body clothing?: A Little Help from another person to put on and taking off regular lower body clothing?: A Little 6 Click Score: 19    End of Session Equipment Utilized During Treatment: Rolling walker (2 wheels)  OT Visit Diagnosis: Muscle weakness (generalized) (M62.81)   Activity Tolerance Patient tolerated treatment well   Patient Left in chair;with call bell/phone within reach   Nurse Communication          Time: [id number] OT Time Calculation (min): 15 min  Charges: OT General Charges $OT Visit: 1 Visit OT Treatments $Self Care/Home Management : 8-22 mins  Ardeth Perfect., MPH, MS, OTR/L ascom [phone] 08/30/21, 4:04 PM

## 2021-08-30 NOTE — Progress Notes (Signed)
PROGRESS NOTE    Meagan Wilcox  [id number] DOB: Apr 22, 1931 DOA: 08/23/2021 PCP: Pcp, No    Brief Narrative:    88 y.o. female with a PMH significant for HFpEF, A. fib on Eliquis, GERD, HTN, HLD, hypothyroidism. They presented from home to the ED on 08/23/2021 with generalized fatigue and dyspnea dyspnea x several days. In the ED, it was found that they had Afib RVR. This is in setting of recent discontinuation of home amiodarone for side effect intolerance. They were treated with aspirin and IV cardizem gtt which improved her HR and symptoms. Cardiology was consulted.  Patient was admitted to medicine service for further workup and management of Afib RVR as outlined in detail below.   Patient underwent direct current cardioversion on 1/11.  Sinus rhythm restored   Post cardioversion patient remains in sinus rhythm.  Her main complaint is weakness.  Therapy has evaluated and recommended skilled nursing facility.  Pending placement   Assessment & Plan:   Principal Problem:   Atrial fibrillation with rapid ventricular response (HCC) Active Problems:   Atrial flutter with rapid ventricular response (HCC)  Atrial flutter with rapid ventricular response -Status post DCCV on 1/11 with restoration of sinus rhythm -Case discussed with cardiology Plan: Continue telemetry monitoring Pindolol 2.5 twice daily Flecainide 50 mg twice daily Beta-blocker dosing has been limited by underlying bradycardia  Bradycardia Patient baseline brady while in NSR Plan: Continue pindolol with holding parameters Continue cardiac monitoring  Essential hypertension  well controlled - Beta-blocker as above  Hypothyroidism  TSH wnl on presentation - continue home synthroid   Dyslipidemia. - continue statin therapy.  History of interstitial cystitis. - continue Elmiron.   Weakness Functional decline Encourage p.o. nutrition Monitor and replace electrolytes as needed PT and OT  evaluations Recommendation for skilled nursing facility Medically stable for discharge   DVT prophylaxis: Apixaban Code Status: Full Family Communication: Niece at bedside 1/14 Disposition Plan: Status is: Inpatient  Remains inpatient appropriate because: Unsafe discharge plan.  Need skilled nursing facility.  Medically stable.  [facility]       Level of care: Progressive  Consultants:  None, cardiology signed off  Procedures:  DCCV  Antimicrobials: None   Subjective: Seen and examined.  Sleeping in bed.  No visible distress  Objective: Vitals:   08/29/21 2336 08/30/21 0436 08/30/21 0744 08/30/21 1237  BP: (!) 113/38 (!) 104/40 (!) 130/52 (!) 116/56  Pulse: 63 (!) 58 85 (!) 51  Resp: 14 16 14 16   Temp: 98.1 F (36.7 C) 97.8 F (36.6 C) 98.1 F (36.7 C) 97.8 F (36.6 C)  TempSrc:   Oral Oral  SpO2: 96% 97% 100% 100%  Weight:        Intake/Output Summary (Last 24 hours) at 08/30/2021 1311 Last data filed at 08/30/2021 0017 Gross per 24 hour  Intake 480 ml  Output 100 ml  Net 380 ml   Filed Weights   08/25/21 0759  Weight: 71.7 kg    Examination:  General exam: No acute distress.  Appears frail.  Hard hearing Respiratory system: Clear to auscultation. Respiratory effort normal. Cardiovascular system: S1-S2, RRR, no murmurs, no pedal edema Gastrointestinal system: Thin, soft, NT/ND, normal bowel sounds Central nervous system: Alert and oriented. No focal neurological deficits. Extremities: Symmetric 5 x 5 power. Skin: No rashes, lesions or ulcers Psychiatry: Judgement and insight appear normal. Mood & affect appropriate.     Data Reviewed: I have personally reviewed following labs and  imaging studies  CBC: Recent Labs  Lab 08/24/21 0643 08/26/21 0804 08/29/21 0513 08/30/21 0458  WBC 6.7 8.2 8.0 7.8  NEUTROABS  --  4.1  --  2.9  HGB 13.2 12.2 10.5* 10.4*  HCT 38.7 36.0 31.3* 30.8*  MCV 92.1 92.8 94.6 91.7   PLT 263 243 234 238   Basic Metabolic Panel: Recent Labs  Lab 08/23/21 1444 08/24/21 0643 08/26/21 0804 08/29/21 0853 08/30/21 0458  NA  --  135 135 134* 137  K  --  4.5 4.4 3.9 4.5  CL  --  99 99 101 103  CO2  --  27 27 25 26   GLUCOSE  --  121* 117* 127* 104*  BUN  --  19 24* 25* 27*  CREATININE  --  1.13* 1.14* 0.99 0.87  CALCIUM  --  9.5 9.6 9.3 9.3  MG 2.1  --  2.0 1.9  --    GFR: Estimated Creatinine Clearance: 43.4 mL/min (by C-G formula based on SCr of 0.87 mg/dL). Liver Function Tests: Recent Labs  Lab 08/29/21 0853  AST 17  ALT 15  ALKPHOS 51  BILITOT 0.3  PROT 6.7  ALBUMIN 3.7   No results for input(s): LIPASE, AMYLASE in the last 168 hours. No results for input(s): AMMONIA in the last 168 hours. Coagulation Profile: No results for input(s): INR, PROTIME in the last 168 hours. Cardiac Enzymes: No results for input(s): CKTOTAL, CKMB, CKMBINDEX, TROPONINI in the last 168 hours. BNP (last 3 results) No results for input(s): PROBNP in the last 8760 hours. HbA1C: No results for input(s): HGBA1C in the last 72 hours. CBG: No results for input(s): GLUCAP in the last 168 hours. Lipid Profile: No results for input(s): CHOL, HDL, LDLCALC, TRIG, CHOLHDL, LDLDIRECT in the last 72 hours. Thyroid Function Tests: No results for input(s): TSH, T4TOTAL, FREET4, T3FREE, THYROIDAB in the last 72 hours. Anemia Panel: No results for input(s): VITAMINB12, FOLATE, FERRITIN, TIBC, IRON, RETICCTPCT in the last 72 hours. Sepsis Labs: No results for input(s): PROCALCITON, LATICACIDVEN in the last 168 hours.  Recent Results (from the past 240 hour(s))  Resp Panel by RT-PCR (Flu A&B, Covid) Nasopharyngeal Swab     Status: None   Collection Time: 08/23/21  2:44 PM   Specimen: Nasopharyngeal Swab; Nasopharyngeal(NP) swabs in vial transport medium  Result Value Ref Range Status   SARS Coronavirus 2 by RT PCR NEGATIVE NEGATIVE Final    Comment: (NOTE) SARS-CoV-2 target nucleic  acids are NOT DETECTED.  The SARS-CoV-2 RNA is generally detectable in upper respiratory specimens during the acute phase of infection. The lowest concentration of SARS-CoV-2 viral copies this assay can detect is 138 copies/mL. A negative result does not preclude SARS-Cov-2 infection and should not be used as the sole basis for treatment or other patient management decisions. A negative result may occur with  improper specimen collection/handling, submission of specimen other than nasopharyngeal swab, presence of viral mutation(s) within the areas targeted by this assay, and inadequate number of viral copies(<138 copies/mL). A negative result must be combined with clinical observations, patient history, and epidemiological information. The expected result is Negative.  Fact Sheet for Patients:  10/21/21  Fact Sheet for Healthcare Providers:  BloggerCourse.com  This test is no t yet approved or cleared by the [place] FDA and  has been authorized for detection and/or diagnosis of SARS-CoV-2 by FDA under an Emergency Use Authorization (EUA). This EUA will remain  in effect (meaning this test can be used) for the  duration of the COVID-19 declaration under Section 564(b)(1) of the Act, 21 U.S.C.section 360bbb-3(b)(1), unless the authorization is terminated  or revoked sooner.       Influenza A by PCR NEGATIVE NEGATIVE Final   Influenza B by PCR NEGATIVE NEGATIVE Final    Comment: (NOTE) The Xpert Xpress SARS-CoV-2/FLU/RSV plus assay is intended as an aid in the diagnosis of influenza from Nasopharyngeal swab specimens and should not be used as a sole basis for treatment. Nasal washings and aspirates are unacceptable for Xpert Xpress SARS-CoV-2/FLU/RSV testing.  Fact Sheet for Patients: BloggerCourse.com  Fact Sheet for Healthcare Providers: SeriousBroker.it  This  test is not yet approved or cleared by the [place] FDA and has been authorized for detection and/or diagnosis of SARS-CoV-2 by FDA under an Emergency Use Authorization (EUA). This EUA will remain in effect (meaning this test can be used) for the duration of the COVID-19 declaration under Section 564(b)(1) of the Act, 21 U.S.C. section 360bbb-3(b)(1), unless the authorization is terminated or revoked.  Performed at [facility], [address] [place], [place] [address]   Resp Panel by RT-PCR (Flu A&B, Covid) Nasopharyngeal Swab     Status: None   Collection Time: 08/28/21  9:11 AM   Specimen: Nasopharyngeal Swab; Nasopharyngeal(NP) swabs in vial transport medium  Result Value Ref Range Status   SARS Coronavirus 2 by RT PCR NEGATIVE NEGATIVE Final    Comment: (NOTE) SARS-CoV-2 target nucleic acids are NOT DETECTED.  The SARS-CoV-2 RNA is generally detectable in upper respiratory specimens during the acute phase of infection. The lowest concentration of SARS-CoV-2 viral copies this assay can detect is 138 copies/mL. A negative result does not preclude SARS-Cov-2 infection and should not be used as the sole basis for treatment or other patient management decisions. A negative result may occur with  improper specimen collection/handling, submission of specimen other than nasopharyngeal swab, presence of viral mutation(s) within the areas targeted by this assay, and inadequate number of viral copies(<138 copies/mL). A negative result must be combined with clinical observations, patient history, and epidemiological information. The expected result is Negative.  Fact Sheet for Patients:  BloggerCourse.com  Fact Sheet for Healthcare Providers:  [employer]ious[employer]ker.it  This test is no t yet approved or cleared by the [place] FDA and  has been authorized for detection and/or diagnosis of SARS-CoV-2 by FDA under an  Emergency Use Authorization (EUA). This EUA will remain  in effect (meaning this test can be used) for the duration of the COVID-19 declaration under Section 564(b)(1) of the Act, 21 U.S.C.section 360bbb-3(b)(1), unless the authorization is terminated  or revoked sooner.       Influenza A by PCR NEGATIVE NEGATIVE Final   Influenza B by PCR NEGATIVE NEGATIVE Final    Comment: (NOTE) The Xpert Xpress SARS-CoV-2/FLU/RSV plus assay is intended as an aid in the diagnosis of influenza from Nasopharyngeal swab specimens and should not be used as a sole basis for treatment. Nasal washings and aspirates are unacceptable for Xpert Xpress SARS-CoV-2/FLU/RSV testing.  Fact Sheet for Patients: BloggerCourse.com  Fact Sheet for Healthcare Providers: [employer]ious[employer]ker.it  This test is not yet approved or cleared by the [place] FDA and has been authorized for detection and/or diagnosis of SARS-CoV-2 by FDA under an Emergency Use Authorization (EUA). This EUA will remain in effect (meaning this test can be used) for the duration of the COVID-19 declaration under Section 564(b)(1) of the Act, 21 U.S.C. section 360bbb-3(b)(1), unless the authorization is terminated or revoked.  Performed at [facility], [address] [place], [place] [address]          Radiology Studies: No results found.      Scheduled Meds:  amitriptyline  25 mg Oral QHS   apixaban  5 mg Oral BID   flecainide  50 mg Oral Q12H   furosemide  20 mg Oral Daily   losartan  100 mg Oral Daily   And   hydrochlorothiazide  12.5 mg Oral Daily   levothyroxine  125 mcg Oral Q0600   melatonin  5 mg Oral QHS   melatonin  5 mg Oral Once   multivitamin with minerals  1 tablet Oral Daily   pantoprazole  40 mg Oral Daily   pentosan polysulfate  100 mg Oral TID   pindolol  2.5 mg Oral BID   simvastatin  20 mg Oral QHS   Continuous Infusions:     LOS: 7  days    Time spent: 25 minutes    Tresa MooreSudheer B Saaya Procell, MD [facility]   If 7PM-7AM, please contact night-coverage  08/30/2021, 1:11 PM

## 2021-08-30 NOTE — TOC Progression Note (Signed)
Transition of Care [facility]) - Progression Note    Patient Details  Name: Meagan Wilcox MRN: [id number] Date of Birth: 10/17/1930  [facility] CM/SW Contact  Gildardo Griffes, [place] Phone Number: 08/30/2021, 8:48 AM  Clinical Narrative:     CSW spoke with Verlon Au at [employer] who reports she has reached out to [employer] regarding patient's insurance auth and notes they observe today as a holiday. CSW has also reached out on [employer] portal as patient's Berkley Harvey shows still pending.   CSW has informed MD that [facility] is able to accept patient tomorrow once auth achieved, dc delayed due to insurance closed on holiday.     Expected Discharge Plan: Skilled Nursing Facility Barriers to Discharge: Continued Medical Work up  Expected Discharge Plan and Services Expected Discharge Plan: Skilled Nursing Facility In-house Referral: Clinical Social Work     Living arrangements for the past 2 months: Single Family Home                                       Social Determinants of Health (SDOH) Interventions    Readmission Risk Interventions No flowsheet data found.

## 2021-08-30 NOTE — Progress Notes (Signed)
Physical Therapy Treatment Patient Details Name: Meagan Wilcox MRN: [id number] DOB: 1931-07-27 Today's Date: 08/30/2021   History of Present Illness Pt. is an 89yoF who comes to [facility] on 08/23/21 c fatigue, dyspnea. Pt found to be in AF c RVR, unable to take amiodarone anymore, pt underwent DC cardioversion on 1/11 c cardiology. PMH: HFpEF, AF on eliquis, GERD, HTN, HLD, hypoTSH.    PT Comments    Patient received in bed, niece present in room. Family have questions regarding discharge and medicines. Reached out to case manager regarding discharge. RN notified about questions regarding meds. Patient is agreeable to PT session. Reports she is feeling better. HR continues to be in upper 50s to low 60s during session. She is mod independent with bed mobility. Transfers with min guard sit to stand and pivoting to [facility]. Patient able to ambulate 100 feet with RW and min guard. Limited by fatigue and B knee pain. Patient will continue to benefit from skilled PT while here to improve strength and functional independence for safe return home.     Recommendations for follow up therapy are one component of a multi-disciplinary discharge planning process, led by the attending physician.  Recommendations may be updated based on patient status, additional functional criteria and insurance authorization.  Follow Up Recommendations  Skilled nursing-short term rehab (<3 hours/day)     Assistance Recommended at Discharge Intermittent Supervision/Assistance  Patient can return home with the following A little help with walking and/or transfers;A little help with bathing/dressing/bathroom;Assistance with cooking/housework;Assist for transportation;Help with stairs or ramp for entrance   Equipment Recommendations  Other (comment) (TBD)    Recommendations for Other Services       Precautions / Restrictions Precautions Precautions: Fall Restrictions Weight Bearing Restrictions: No     Mobility  Bed  Mobility Overal bed mobility: Modified Independent Bed Mobility: Supine to Sit     Supine to sit: Supervision          Transfers Overall transfer level: Needs assistance Equipment used: 1 person hand held assist Transfers: Sit to/from Stand Sit to Stand: Min guard           General transfer comment: painful knees L > R with transfers and gait    Ambulation/Gait Ambulation/Gait assistance: Min guard Gait Distance (Feet): 100 Feet Assistive device: Rolling walker (2 wheels) Gait Pattern/deviations: Step-through pattern;Decreased step length - right;Decreased step length - left;Decreased stride length;Shuffle;Antalgic;Trunk flexed       General Gait Details: Patient limited by fatigue and Knee pain with ambulation. 1 brief standing rest break   Stairs             Wheelchair Mobility    Modified Rankin (Stroke Patients Only)       Balance Overall balance assessment: Needs assistance Sitting-balance support: Feet supported Sitting balance-Leahy Scale: Good     Standing balance support: Bilateral upper extremity supported;During functional activity;Reliant on assistive device for balance Standing balance-Leahy Scale: Fair                              Cognition Arousal/Alertness: Awake/alert Behavior During Therapy: WFL for tasks assessed/performed Overall Cognitive Status: Within Functional Limits for tasks assessed                                 General Comments: HOH        Exercises Other Exercises Other Exercises: LAQ  x 10 reps each, STS 2x5 reps with min guard    General Comments        Pertinent Vitals/Pain Pain Assessment: Faces Faces Pain Scale: Hurts little more Pain Location: left knee pain during mobility Pain Descriptors / Indicators: Discomfort;Sore;Grimacing;Guarding Pain Intervention(s): Limited activity within patient's tolerance;Monitored during session    Home Living                           Prior Function            PT Goals (current goals can now be found in the care plan section) Acute Rehab PT Goals Patient Stated Goal: regain independence in mobility PT Goal Formulation: With patient Time For Goal Achievement: 09/09/21 Potential to Achieve Goals: Good Progress towards PT goals: Progressing toward goals    Frequency    Min 2X/week      PT Plan Current plan remains appropriate    Co-evaluation              AM-PAC PT "6 Clicks" Mobility   Outcome Measure  Help needed turning from your back to your side while in a flat bed without using bedrails?: A Little Help needed moving from lying on your back to sitting on the side of a flat bed without using bedrails?: A Little Help needed moving to and from a bed to a chair (including a wheelchair)?: A Little Help needed standing up from a chair using your arms (e.g., wheelchair or bedside chair)?: A Little Help needed to walk in hospital room?: A Little Help needed climbing 3-5 steps with a railing? : A Little 6 Click Score: 18    End of Session Equipment Utilized During Treatment: Gait belt Activity Tolerance: Patient limited by fatigue;Patient limited by pain Patient left: in chair;with call bell/phone within reach;with family/visitor present Nurse Communication: Mobility status PT Visit Diagnosis: Difficulty in walking, not elsewhere classified (R26.2);Muscle weakness (generalized) (M62.81)     Time: 8242-3536 PT Time Calculation (min) (ACUTE ONLY): 28 min  Charges:  $Gait Training: 8-22 mins $Therapeutic Exercise: 8-22 mins                     Esteban Kobashigawa, PT, GCS 08/30/21,2:29 PM

## 2021-08-31 DIAGNOSIS — Z7901 Long term (current) use of anticoagulants: Secondary | ICD-10-CM | POA: Diagnosis not present

## 2021-08-31 DIAGNOSIS — I110 Hypertensive heart disease with heart failure: Secondary | ICD-10-CM | POA: Diagnosis not present

## 2021-08-31 DIAGNOSIS — R001 Bradycardia, unspecified: Secondary | ICD-10-CM | POA: Diagnosis not present

## 2021-08-31 DIAGNOSIS — R531 Weakness: Secondary | ICD-10-CM | POA: Diagnosis not present

## 2021-08-31 DIAGNOSIS — N3010 Interstitial cystitis (chronic) without hematuria: Secondary | ICD-10-CM | POA: Diagnosis not present

## 2021-08-31 DIAGNOSIS — I959 Hypotension, unspecified: Secondary | ICD-10-CM | POA: Diagnosis not present

## 2021-08-31 DIAGNOSIS — Z743 Need for continuous supervision: Secondary | ICD-10-CM | POA: Diagnosis not present

## 2021-08-31 DIAGNOSIS — I4892 Unspecified atrial flutter: Secondary | ICD-10-CM | POA: Diagnosis not present

## 2021-08-31 DIAGNOSIS — R011 Cardiac murmur, unspecified: Secondary | ICD-10-CM | POA: Diagnosis not present

## 2021-08-31 DIAGNOSIS — F418 Other specified anxiety disorders: Secondary | ICD-10-CM | POA: Diagnosis not present

## 2021-08-31 DIAGNOSIS — I4891 Unspecified atrial fibrillation: Secondary | ICD-10-CM | POA: Diagnosis not present

## 2021-08-31 DIAGNOSIS — M5136 Other intervertebral disc degeneration, lumbar region: Secondary | ICD-10-CM | POA: Diagnosis not present

## 2021-08-31 DIAGNOSIS — I5032 Chronic diastolic (congestive) heart failure: Secondary | ICD-10-CM | POA: Diagnosis not present

## 2021-08-31 DIAGNOSIS — I480 Paroxysmal atrial fibrillation: Secondary | ICD-10-CM | POA: Diagnosis not present

## 2021-08-31 DIAGNOSIS — I10 Essential (primary) hypertension: Secondary | ICD-10-CM | POA: Diagnosis not present

## 2021-08-31 LAB — RESP PANEL BY RT-PCR (FLU A&B, COVID) ARPGX2
Influenza A by PCR: NEGATIVE
Influenza B by PCR: NEGATIVE
SARS Coronavirus 2 by RT PCR: NEGATIVE

## 2021-08-31 MED ORDER — MELATONIN 5 MG PO TABS: 5.0000 mg | ORAL_TABLET | Freq: Every day | ORAL | 0 refills | Status: DC

## 2021-08-31 MED ORDER — PINDOLOL 5 MG PO TABS: 2.5000 mg | ORAL_TABLET | Freq: Two times a day (BID) | ORAL | Status: DC

## 2021-08-31 MED ORDER — FUROSEMIDE 20 MG PO TABS: 20.0000 mg | ORAL_TABLET | Freq: Every day | ORAL | Status: DC

## 2021-08-31 MED ORDER — ALPRAZOLAM 0.25 MG PO TABS: 0.2500 mg | ORAL_TABLET | Freq: Two times a day (BID) | ORAL | 0 refills | Status: AC | PRN

## 2021-08-31 MED ORDER — FLECAINIDE ACETATE 50 MG PO TABS: 50.0000 mg | ORAL_TABLET | Freq: Two times a day (BID) | ORAL | Status: DC

## 2021-08-31 NOTE — TOC Progression Note (Signed)
Transition of Care [facility]) - Progression Note    Patient Details  Name: Meagan Wilcox MRN: [id number] Date of Birth: 1931/05/26  Transition of Care [facility]) CM/SW Contact  Maree Krabbe, LCSW Phone Number: 08/31/2021, 9:07 AM  Clinical Narrative:   Per Verlon Au at [facility] they can got Berkley Harvey and will take pt today.    Expected Discharge Plan: Skilled Nursing Facility Barriers to Discharge: Continued Medical Work up  Expected Discharge Plan and Services Expected Discharge Plan: Skilled Nursing Facility In-house Referral: Clinical Social Work     Living arrangements for the past 2 months: Single Family Home Expected Discharge Date: 08/31/21                                     Social Determinants of Health (SDOH) Interventions    Readmission Risk Interventions No flowsheet data found.

## 2021-08-31 NOTE — Care Management Important Message (Signed)
Important Message  Patient Details  Name: Meagan Wilcox MRN: [id number] Date of Birth: 1931-02-01   Medicare Important Message Given:  Yes     Johnell Comings 08/31/2021, 11:34 AM

## 2021-08-31 NOTE — TOC Transition Note (Signed)
Transition of Care [facility]) - CM/SW Discharge Note   Patient Details  Name: MOSELLA KASA MRN: [id number] Date of Birth: 1931/08/11  Transition of Care [facility]) CM/SW Contact:  Maree Krabbe, LCSW Phone Number: 08/31/2021, 3:57 PM   Clinical Narrative:  Clinical Social Worker facilitated patient discharge including contacting patient family and facility to confirm patient discharge plans.  Clinical information faxed to facility and family agreeable with plan.  CSW arranged ambulance transport via [facility] to [facility] .  RN to call [phone] for report prior to discharge.   Final next level of care: Skilled Nursing Facility Barriers to Discharge: No Barriers Identified   Patient Goals and CMS Choice Patient states their goals for this hospitalization and ongoing recovery are:: for pt to get better   Choice offered to / list presented to :  (Pt's Neice)  Discharge Placement              Patient chooses bed at:  ([facility]) Patient to be transferred to facility by: [facility]   Patient and family notified of of transfer: 08/31/21  Discharge Plan and Services In-house Referral: Clinical Social Work                                   Social Determinants of Health (SDOH) Interventions     Readmission Risk Interventions No flowsheet data found.

## 2021-08-31 NOTE — Discharge Summary (Signed)
Physician Discharge Summary  Meagan Wilcox SWN:[id number] DOB: March 14, 1931 DOA: 08/23/2021  PCP: Oneita Hurt, No  Admit date: 08/23/2021 Discharge date: 08/31/2021  Admitted From: Home Disposition:  SNF  Recommendations for Outpatient Follow-up:  Follow up with PCP in 1-2 weeks Follow up with EP and cardiology as directed  Home Health:No Equipment/Devices:None   Discharge Condition:Stable  CODE STATUS:Full  Diet recommendation: Heart healthy  Brief/Interim Summary: 88 y.o. female with a PMH significant for HFpEF, A. fib on Eliquis, GERD, HTN, HLD, hypothyroidism. They presented from home to the ED on 08/23/2021 with generalized fatigue and dyspnea dyspnea x several days. In the ED, it was found that they had Afib RVR. This is in setting of recent discontinuation of home amiodarone for side effect intolerance. They were treated with aspirin and IV cardizem gtt which improved her HR and symptoms. Cardiology was consulted.  Patient was admitted to medicine service for further workup and management of Afib RVR as outlined in detail below.   Patient underwent direct current cardioversion on 1/11.  Sinus rhythm restored   Post cardioversion patient remains in sinus rhythm.  Her main complaint is weakness.  Therapy has evaluated and recommended skilled nursing facility.  Pending placement    Discharge Diagnoses:  Principal Problem:   Atrial fibrillation with rapid ventricular response (HCC) Active Problems:   Atrial flutter with rapid ventricular response (HCC)  Atrial flutter with rapid ventricular response -Status post DCCV on 1/11 with restoration of sinus rhythm -Case discussed with cardiology Plan: Continue telemetry monitoring Pindolol 2.5 twice daily Flecainide 50 mg twice daily Beta-blocker dosing has been limited by underlying bradycardia   Bradycardia Patient baseline brady while in NSR Plan: Continue pindolol with holding parameters Continue cardiac monitoring  Essential  hypertension  well controlled - Beta-blocker as above --Restart home zestoretic  Hypothyroidism  TSH wnl on presentation - continue home synthroid   Dyslipidemia. - continue statin therapy.  History of interstitial cystitis. - continue Elmiron.   Weakness Functional decline Encourage p.o. nutrition Monitor and replace electrolytes as needed PT and OT evaluations Recommendation for skilled nursing facility Medically stable for discharge  Discharge Instructions  Discharge Instructions     Amb referral to AFIB Clinic   Complete by: As directed    Diet - low sodium heart healthy   Complete by: As directed    Increase activity slowly   Complete by: As directed       Allergies as of 08/31/2021       Reactions   Amoxicillin Diarrhea   Penicillins Rash   Did it involve swelling of the face/tongue/throat, SOB, or low BP? No Did it involve sudden or severe rash/hives, skin peeling, or any reaction on the inside of your mouth or nose? No Did you need to seek medical attention at a hospital or doctor's office? Yes When did it last happen?      65 years ago If all above answers are NO, may proceed with cephalosporin use.        Medication List     STOP taking these medications    amLODipine 10 MG tablet Commonly known as: NORVASC       TAKE these medications    acetaminophen 325 MG tablet Commonly known as: TYLENOL Take 325 mg by mouth every 6 (six) hours as needed for mild pain, fever, headache or moderate pain.   ALPRAZolam 0.25 MG tablet Commonly known as: XANAX Take 1 tablet (0.25 mg total) by mouth 2 (two) times daily as needed  for up to 2 days for anxiety. SNF use only   amitriptyline 25 MG tablet Commonly known as: ELAVIL TAKE 1 TABLET BY MOUTH DAILY   BIOTIN PO Take 1 tablet by mouth 3 times daily with meals, bedtime and 2 AM.   Eliquis 5 MG Tabs tablet Generic drug: apixaban TAKE 1 TABLET(5 MG) BY MOUTH TWICE DAILY   Elmiron 100 MG  capsule Generic drug: pentosan polysulfate TAKE 1 CAPSULE BY MOUTH THREE TIMES DAILY   flecainide 50 MG tablet Commonly known as: TAMBOCOR Take 1 tablet (50 mg total) by mouth every 12 (twelve) hours.   furosemide 20 MG tablet Commonly known as: LASIX Take 1 tablet (20 mg total) by mouth daily. What changed:  how much to take how to take this when to take this additional instructions   levothyroxine 125 MCG tablet Commonly known as: SYNTHROID TAKE 1 TABLET(125 MCG) BY MOUTH DAILY   losartan-hydrochlorothiazide 100-12.5 MG tablet Commonly known as: HYZAAR TAKE 1 TABLET BY MOUTH DAILY   melatonin 5 MG Tabs Take 1 tablet (5 mg total) by mouth at bedtime.   multivitamin capsule Take 1 capsule by mouth daily.   naproxen sodium 220 MG tablet Commonly known as: ALEVE Take 220 mg by mouth daily as needed.   omeprazole 20 MG capsule Commonly known as: PRILOSEC TAKE 1 CAPSULE BY MOUTH DAILY   pindolol 5 MG tablet Commonly known as: VISKEN Take 0.5 tablets (2.5 mg total) by mouth 2 (two) times daily.   simvastatin 20 MG tablet Commonly known as: ZOCOR TAKE 1 TABLET BY MOUTH DAILY        Allergies  Allergen Reactions   Amoxicillin Diarrhea   Penicillins Rash    Did it involve swelling of the face/tongue/throat, SOB, or low BP? No Did it involve sudden or severe rash/hives, skin peeling, or any reaction on the inside of your mouth or nose? No Did you need to seek medical attention at a hospital or doctor's office? Yes When did it last happen?      65 years ago If all above answers are NO, may proceed with cephalosporin use.    Consultations: Cardiology- [facility]   Procedures/Studies: DG Chest 2 View  Result Date: 08/23/2021 CLINICAL DATA:  Shortness of breath EXAM: CHEST - 2 VIEW COMPARISON:  07/01/2020 FINDINGS: Cardiac size is unremarkable. Increase in AP diameter of chest suggests COPD. There are no signs of pulmonary edema or focal pulmonary consolidation.  There is no pleural effusion or pneumothorax. IMPRESSION: COPD. There are no signs of pulmonary edema or focal pulmonary consolidation. Electronically Signed   By: Ernie Avena M.D.   On: 08/23/2021 12:27      Subjective: Seen and examined.  Medically stable for DC to SNF  Discharge Exam: Vitals:   08/31/21 0523 08/31/21 0739  BP: (!) 107/42 (!) 119/52  Pulse: (!) 50 (!) 50  Resp:  20  Temp: 97.6 F (36.4 C) 98.1 F (36.7 C)  SpO2: 94% 100%   Vitals:   08/30/21 2020 08/31/21 0021 08/31/21 0523 08/31/21 0739  BP: (!) 116/45 (!) 87/35 (!) 107/42 (!) 119/52  Pulse: (!) 59 (!) 53 (!) 50 (!) 50  Resp: 15 16  20   Temp: 98.6 F (37 C) 98.5 F (36.9 C) 97.6 F (36.4 C) 98.1 F (36.7 C)  TempSrc:   Oral   SpO2: 97% 90% 94% 100%  Weight:        General: Pt is alert, awake, not in acute distress Cardiovascular: RRR, S1/S2 +,  no rubs, no gallops Respiratory: CTA bilaterally, no wheezing, no rhonchi Abdominal: Soft, NT, ND, bowel sounds + Extremities: no edema, no cyanosis    The results of significant diagnostics from this hospitalization (including imaging, microbiology, ancillary and laboratory) are listed below for reference.     Microbiology: Recent Results (from the past 240 hour(s))  Resp Panel by RT-PCR (Flu A&B, Covid) Nasopharyngeal Swab     Status: None   Collection Time: 08/23/21  2:44 PM   Specimen: Nasopharyngeal Swab; Nasopharyngeal(NP) swabs in vial transport medium  Result Value Ref Range Status   SARS Coronavirus 2 by RT PCR NEGATIVE NEGATIVE Final    Comment: (NOTE) SARS-CoV-2 target nucleic acids are NOT DETECTED.  The SARS-CoV-2 RNA is generally detectable in upper respiratory specimens during the acute phase of infection. The lowest concentration of SARS-CoV-2 viral copies this assay can detect is 138 copies/mL. A negative result does not preclude SARS-Cov-2 infection and should not be used as the sole basis for treatment or other patient  management decisions. A negative result may occur with  improper specimen collection/handling, submission of specimen other than nasopharyngeal swab, presence of viral mutation(s) within the areas targeted by this assay, and inadequate number of viral copies(<138 copies/mL). A negative result must be combined with clinical observations, patient history, and epidemiological information. The expected result is Negative.  Fact Sheet for Patients:  BloggerCourse.com  Fact Sheet for Healthcare Providers:  SeriousBroker.it  This test is no t yet approved or cleared by the [place] FDA and  has been authorized for detection and/or diagnosis of SARS-CoV-2 by FDA under an Emergency Use Authorization (EUA). This EUA will remain  in effect (meaning this test can be used) for the duration of the COVID-19 declaration under Section 564(b)(1) of the Act, 21 U.S.C.section 360bbb-3(b)(1), unless the authorization is terminated  or revoked sooner.       Influenza A by PCR NEGATIVE NEGATIVE Final   Influenza B by PCR NEGATIVE NEGATIVE Final    Comment: (NOTE) The Xpert Xpress SARS-CoV-2/FLU/RSV plus assay is intended as an aid in the diagnosis of influenza from Nasopharyngeal swab specimens and should not be used as a sole basis for treatment. Nasal washings and aspirates are unacceptable for Xpert Xpress SARS-CoV-2/FLU/RSV testing.  Fact Sheet for Patients: BloggerCourse.com  Fact Sheet for Healthcare Providers: SeriousBroker.it  This test is not yet approved or cleared by the [place] FDA and has been authorized for detection and/or diagnosis of SARS-CoV-2 by FDA under an Emergency Use Authorization (EUA). This EUA will remain in effect (meaning this test can be used) for the duration of the COVID-19 declaration under Section 564(b)(1) of the Act, 21 U.S.C. section 360bbb-3(b)(1),  unless the authorization is terminated or revoked.  Performed at [facility], [address] [place], [place] [address]   Resp Panel by RT-PCR (Flu A&B, Covid) Nasopharyngeal Swab     Status: None   Collection Time: 08/28/21  9:11 AM   Specimen: Nasopharyngeal Swab; Nasopharyngeal(NP) swabs in vial transport medium  Result Value Ref Range Status   SARS Coronavirus 2 by RT PCR NEGATIVE NEGATIVE Final    Comment: (NOTE) SARS-CoV-2 target nucleic acids are NOT DETECTED.  The SARS-CoV-2 RNA is generally detectable in upper respiratory specimens during the acute phase of infection. The lowest concentration of SARS-CoV-2 viral copies this assay can detect is 138 copies/mL. A negative result does not preclude SARS-Cov-2 infection and should not be used as the sole basis for treatment or other patient  management decisions. A negative result may occur with  improper specimen collection/handling, submission of specimen other than nasopharyngeal swab, presence of viral mutation(s) within the areas targeted by this assay, and inadequate number of viral copies(<138 copies/mL). A negative result must be combined with clinical observations, patient history, and epidemiological information. The expected result is Negative.  Fact Sheet for Patients:  BloggerCourse.com  Fact Sheet for Healthcare Providers:  SeriousBroker.it  This test is no t yet approved or cleared by the [place] FDA and  has been authorized for detection and/or diagnosis of SARS-CoV-2 by FDA under an Emergency Use Authorization (EUA). This EUA will remain  in effect (meaning this test can be used) for the duration of the COVID-19 declaration under Section 564(b)(1) of the Act, 21 U.S.C.section 360bbb-3(b)(1), unless the authorization is terminated  or revoked sooner.       Influenza A by PCR NEGATIVE NEGATIVE Final   Influenza B by PCR NEGATIVE NEGATIVE  Final    Comment: (NOTE) The Xpert Xpress SARS-CoV-2/FLU/RSV plus assay is intended as an aid in the diagnosis of influenza from Nasopharyngeal swab specimens and should not be used as a sole basis for treatment. Nasal washings and aspirates are unacceptable for Xpert Xpress SARS-CoV-2/FLU/RSV testing.  Fact Sheet for Patients: BloggerCourse.com  Fact Sheet for Healthcare Providers: SeriousBroker.it  This test is not yet approved or cleared by the [place] FDA and has been authorized for detection and/or diagnosis of SARS-CoV-2 by FDA under an Emergency Use Authorization (EUA). This EUA will remain in effect (meaning this test can be used) for the duration of the COVID-19 declaration under Section 564(b)(1) of the Act, 21 U.S.C. section 360bbb-3(b)(1), unless the authorization is terminated or revoked.  Performed at [facility], [address] [place], [place] [address]      Labs: BNP (last 3 results) Recent Labs    08/23/21 1149  BNP 292.7*   Basic Metabolic Panel: Recent Labs  Lab 08/26/21 0804 08/29/21 0853 08/30/21 0458  NA 135 134* 137  K 4.4 3.9 4.5  CL 99 101 103  CO2 27 25 26   GLUCOSE 117* 127* 104*  BUN 24* 25* 27*  CREATININE 1.14* 0.99 0.87  CALCIUM 9.6 9.3 9.3  MG 2.0 1.9  --    Liver Function Tests: Recent Labs  Lab 08/29/21 0853  AST 17  ALT 15  ALKPHOS 51  BILITOT 0.3  PROT 6.7  ALBUMIN 3.7   No results for input(s): LIPASE, AMYLASE in the last 168 hours. No results for input(s): AMMONIA in the last 168 hours. CBC: Recent Labs  Lab 08/26/21 0804 08/29/21 0513 08/30/21 0458  WBC 8.2 8.0 7.8  NEUTROABS 4.1  --  2.9  HGB 12.2 10.5* 10.4*  HCT 36.0 31.3* 30.8*  MCV 92.8 94.6 91.7  PLT 243 234 238   Cardiac Enzymes: No results for input(s): CKTOTAL, CKMB, CKMBINDEX, TROPONINI in the last 168 hours. BNP: Invalid input(s): POCBNP CBG: No results for input(s):  GLUCAP in the last 168 hours. D-Dimer No results for input(s): DDIMER in the last 72 hours. Hgb A1c No results for input(s): HGBA1C in the last 72 hours. Lipid Profile No results for input(s): CHOL, HDL, LDLCALC, TRIG, CHOLHDL, LDLDIRECT in the last 72 hours. Thyroid function studies No results for input(s): TSH, T4TOTAL, T3FREE, THYROIDAB in the last 72 hours.  Invalid input(s): FREET3 Anemia work up No results for input(s): VITAMINB12, FOLATE, FERRITIN, TIBC, IRON, RETICCTPCT in the last 72 hours. Urinalysis    Component  Value Date/Time   COLORURINE COLORLESS (A) 05/07/2019 1059   APPEARANCEUR CLEAR (A) 05/07/2019 1059   LABSPEC 1.003 (L) 05/07/2019 1059   PHURINE 5.0 05/07/2019 1059   GLUCOSEU NEGATIVE 05/07/2019 1059   HGBUR NEGATIVE 05/07/2019 1059   BILIRUBINUR NEGATIVE 05/07/2019 1059   KETONESUR NEGATIVE 05/07/2019 1059   PROTEINUR NEGATIVE 05/07/2019 1059   NITRITE NEGATIVE 05/07/2019 1059   LEUKOCYTESUR NEGATIVE 05/07/2019 1059   Sepsis Labs Invalid input(s): PROCALCITONIN,  WBC,  LACTICIDVEN Microbiology Recent Results (from the past 240 hour(s))  Resp Panel by RT-PCR (Flu A&B, Covid) Nasopharyngeal Swab     Status: None   Collection Time: 08/23/21  2:44 PM   Specimen: Nasopharyngeal Swab; Nasopharyngeal(NP) swabs in vial transport medium  Result Value Ref Range Status   SARS Coronavirus 2 by RT PCR NEGATIVE NEGATIVE Final    Comment: (NOTE) SARS-CoV-2 target nucleic acids are NOT DETECTED.  The SARS-CoV-2 RNA is generally detectable in upper respiratory specimens during the acute phase of infection. The lowest concentration of SARS-CoV-2 viral copies this assay can detect is 138 copies/mL. A negative result does not preclude SARS-Cov-2 infection and should not be used as the sole basis for treatment or other patient management decisions. A negative result may occur with  improper specimen collection/handling, submission of specimen other than nasopharyngeal  swab, presence of viral mutation(s) within the areas targeted by this assay, and inadequate number of viral copies(<138 copies/mL). A negative result must be combined with clinical observations, patient history, and epidemiological information. The expected result is Negative.  Fact Sheet for Patients:  BloggerCourse.comhttps://www.fda.gov/media/152166/download  Fact Sheet for Healthcare Providers:  SeriousBroker.ithttps://www.fda.gov/media/152162/download  This test is no t yet approved or cleared by the [place] FDA and  has been authorized for detection and/or diagnosis of SARS-CoV-2 by FDA under an Emergency Use Authorization (EUA). This EUA will remain  in effect (meaning this test can be used) for the duration of the COVID-19 declaration under Section 564(b)(1) of the Act, 21 U.S.C.section 360bbb-3(b)(1), unless the authorization is terminated  or revoked sooner.       Influenza A by PCR NEGATIVE NEGATIVE Final   Influenza B by PCR NEGATIVE NEGATIVE Final    Comment: (NOTE) The Xpert Xpress SARS-CoV-2/FLU/RSV plus assay is intended as an aid in the diagnosis of influenza from Nasopharyngeal swab specimens and should not be used as a sole basis for treatment. Nasal washings and aspirates are unacceptable for Xpert Xpress SARS-CoV-2/FLU/RSV testing.  Fact Sheet for Patients: BloggerCourse.comhttps://www.fda.gov/media/152166/download  Fact Sheet for Healthcare Providers: SeriousBroker.ithttps://www.fda.gov/media/152162/download  This test is not yet approved or cleared by the [place] FDA and has been authorized for detection and/or diagnosis of SARS-CoV-2 by FDA under an Emergency Use Authorization (EUA). This EUA will remain in effect (meaning this test can be used) for the duration of the COVID-19 declaration under Section 564(b)(1) of the Act, 21 U.S.C. section 360bbb-3(b)(1), unless the authorization is terminated or revoked.  Performed at [facility], [address] [place], [place] [address]   Resp  Panel by RT-PCR (Flu A&B, Covid) Nasopharyngeal Swab     Status: None   Collection Time: 08/28/21  9:11 AM   Specimen: Nasopharyngeal Swab; Nasopharyngeal(NP) swabs in vial transport medium  Result Value Ref Range Status   SARS Coronavirus 2 by RT PCR NEGATIVE NEGATIVE Final    Comment: (NOTE) SARS-CoV-2 target nucleic acids are NOT DETECTED.  The SARS-CoV-2 RNA is generally detectable in upper respiratory specimens during the acute phase of infection. The lowest concentration  of SARS-CoV-2 viral copies this assay can detect is 138 copies/mL. A negative result does not preclude SARS-Cov-2 infection and should not be used as the sole basis for treatment or other patient management decisions. A negative result may occur with  improper specimen collection/handling, submission of specimen other than nasopharyngeal swab, presence of viral mutation(s) within the areas targeted by this assay, and inadequate number of viral copies(<138 copies/mL). A negative result must be combined with clinical observations, patient history, and epidemiological information. The expected result is Negative.  Fact Sheet for Patients:  BloggerCourse.comhttps://www.fda.gov/media/152166/download  Fact Sheet for Healthcare Providers:  SeriousBroker.ithttps://www.fda.gov/media/152162/download  This test is no t yet approved or cleared by the [place] FDA and  has been authorized for detection and/or diagnosis of SARS-CoV-2 by FDA under an Emergency Use Authorization (EUA). This EUA will remain  in effect (meaning this test can be used) for the duration of the COVID-19 declaration under Section 564(b)(1) of the Act, 21 U.S.C.section 360bbb-3(b)(1), unless the authorization is terminated  or revoked sooner.       Influenza A by PCR NEGATIVE NEGATIVE Final   Influenza B by PCR NEGATIVE NEGATIVE Final    Comment: (NOTE) The Xpert Xpress SARS-CoV-2/FLU/RSV plus assay is intended as an aid in the diagnosis of influenza from Nasopharyngeal  swab specimens and should not be used as a sole basis for treatment. Nasal washings and aspirates are unacceptable for Xpert Xpress SARS-CoV-2/FLU/RSV testing.  Fact Sheet for Patients: BloggerCourse.comhttps://www.fda.gov/media/152166/download  Fact Sheet for Healthcare Providers: SeriousBroker.ithttps://www.fda.gov/media/152162/download  This test is not yet approved or cleared by the [place] FDA and has been authorized for detection and/or diagnosis of SARS-CoV-2 by FDA under an Emergency Use Authorization (EUA). This EUA will remain in effect (meaning this test can be used) for the duration of the COVID-19 declaration under Section 564(b)(1) of the Act, 21 U.S.C. section 360bbb-3(b)(1), unless the authorization is terminated or revoked.  Performed at [facility], [address] [place], [place] [address]      Time coordinating discharge: Over 30 minutes  SIGNED:   Tresa MooreSudheer B Danita Proud, MD  [facility] 08/31/2021, 8:27 AM Pager   If 7PM-7AM, please contact night-coverage

## 2021-09-01 DIAGNOSIS — I10 Essential (primary) hypertension: Secondary | ICD-10-CM | POA: Diagnosis not present

## 2021-09-01 DIAGNOSIS — I4892 Unspecified atrial flutter: Secondary | ICD-10-CM | POA: Diagnosis not present

## 2021-09-01 DIAGNOSIS — R531 Weakness: Secondary | ICD-10-CM | POA: Diagnosis not present

## 2021-09-01 DIAGNOSIS — I4891 Unspecified atrial fibrillation: Secondary | ICD-10-CM | POA: Diagnosis not present

## 2021-09-14 ENCOUNTER — Telehealth: Payer: Self-pay | Admitting: Internal Medicine

## 2021-09-14 DIAGNOSIS — I4891 Unspecified atrial fibrillation: Secondary | ICD-10-CM

## 2021-09-14 DIAGNOSIS — I4892 Unspecified atrial flutter: Secondary | ICD-10-CM

## 2021-09-14 NOTE — Telephone Encounter (Signed)
STAT if HR is under 50 or over 120 (normal HR is 60-100 beats per minute)  What is your heart rate? 40's   Do you have a log of your heart rate readings (document readings)? Brady 40's at rest increases with exertion to normal   Do you have any other symptoms? No     Per [facility] call patient niece at [phone]

## 2021-09-14 NOTE — Telephone Encounter (Signed)
Attempted to call pt's niece back. No answer. LMTCB.  Pt was discharged from hospital 08/31/21 for a fib with RVR. Started flecainide and pindolol.  Pt scheduled for hospital follow up 2/8.  Holding slot for tomorrow with Dr. Okey Dupre if needed.   Will wait for call back to discuss further.

## 2021-09-15 NOTE — Telephone Encounter (Signed)
Spoke with pt's niece Hosie Poisson, [place] approved.  Pt was instructed to hold Pindolol for HR <50. Lavernne states that when pt does hold Pindolol that her HR still stays ~55-59.  Denies dizziness, light headedness, shortness of breath.  Pt BP running 118/60-130s/50s. Pt also occasionally still feels "flutter in chest." Pt has home health and PT set up and is doing well as she is trying to take it easy during this time. Still has some weakness but is stable.  Dr. Saunders Revel made aware and advises pt be seen in our office this week, preferably in next couple days.  We can place referral for follow up to see Dr. Caryl Comes for EP. Dr. Caryl Comes saw pt in hospital.  Scheduled pt with Dr. Saunders Revel tomorrow 2/2 at 1:40 PM.  Referral placed and forwarded to scheduling to set up f/u with Dr. Caryl Comes.   Lavernne appreciative and will let us know of any changes prior to appt.

## 2021-09-15 NOTE — Progress Notes (Signed)
Follow-up Outpatient Visit Date: 09/16/2021  Primary Care Provider: Pcp, No No address on file  Chief Complaint: Palpitations and fatigue  HPI:  Meagan Wilcox is a 88 y.o. female with history of paroxysmal atrial fibrillation/flutter, HFpEF, hypertension, hyperlipidemia, hypothyroidism, GERD, and arthritis, who presents for follow-up of atrial flutter and HFpEF.  She was last seen in our office in late November by Meagan Bayley, NP, at which time she was feeling fairly well.  As blood pressure remains suboptimally controlled, amlodipine was increased to 10 mg daily.  She was admitted earlier this month with recurrent atrial fibrillation with rapid ventricular response.  She was previously on amiodarone but did not tolerate this.  She was seen by Dr. Caryl Wilcox, who recommended initiation of flecainide and nadolol.  She underwent successful cardioversion and was ultimately discharged to rehab.  Her family reached out to our office yesterday, concerned about resting heart rates in the 78s.  Today, [name] reports that she has been feeling more fatigued since her recent hospitalization.  She wonders if her low heart rates could be contributing.  She has been participating with physical therapy and notes that she has been able to walk further since leaving the hospital.  She has tracked her heart rates at home and notes that they were in the 40s while on pindolol.  She last took pindolol yesterday morning and has noticed her heart rates increased slightly at rest into the 50s.  She overall feels about the same.  She became concerned yesterday because she experienced intermittent palpitations.  She reports that her heart rate was still fairly slow with ongoing palpitations as measured by her blood pressure cuff.  She has remained on apixaban and flecainide.  She denies chest pain as well as frank shortness of breath and edema.  She has been a little lightheaded at  times.  --------------------------------------------------------------------------------------------------  Past Medical History:  Diagnosis Date   (HFpEF) heart failure with preserved ejection fraction ([place])    a. 01/2021 Echo: EF 55-60%, no rwma, GrII DD, nl RV fxn, mod elev PASP. Mild MR, mild-mod TR.   Abnormal mammogram, unspecified 2011   Arthritis 2002   Chronic pain syndrome 08/01/2017   GERD (gastroesophageal reflux disease)    Heart murmur 1985   Hyperlipidemia    Hypertension 2005   Hypothyroidism    Persistent atrial fibrillation/Atypical atrial flutter ([place])    a. 04/2019 TEE/DCCV; b. 05/2019 Recurrent Aflutter-->Amio-->DCCV; c. CHA2DS2VASc = 4-->Eliquis; d. 01/2021 Zio: Avg HR 55 (40-90), 5.1% PVC burden. 10 atrial runs (longest 18 beats, fastest 126). No pauses or afib/flutter. Triggered events = RSR, PVCs, and jxnl.   Past Surgical History:  Procedure Laterality Date   APPENDECTOMY  1939   BREAST BIOPSY Left 02-24-14   benign   BREAST BIOPSY Left 02/11/2016   neg   BREAST BIOPSY Left 2013   neg   BREAST EXCISIONAL BIOPSY Left 2014   neg   BREAST SURGERY Left 2013   stereotactic biopsy    BREAST SURGERY Left May 2010   1 mm foci of atypical ductal hyperplasia   CARDIOVERSION N/A 05/08/2019   Procedure: CARDIOVERSION;  Surgeon: Kate Sable, MD;  Location: [facility];  Service: Cardiovascular;  Laterality: N/A;   CARDIOVERSION N/A 05/31/2019   Procedure: CARDIOVERSION;  Surgeon: Minna Merritts, MD;  Location: [facility];  Service: Cardiovascular;  Laterality: N/A;   CARDIOVERSION N/A 08/25/2021   Procedure: CARDIOVERSION;  Surgeon: Kate Sable, MD;  Location: [facility];  Service: Cardiovascular;  Laterality:  N/A;   cataract surgery  Bilateral 2009   COLONOSCOPY  2010   Dr. Vira Wilcox   left breast biopsy   2009   TEE WITHOUT CARDIOVERSION N/A 05/08/2019   Procedure: TRANSESOPHAGEAL ECHOCARDIOGRAM (TEE);  Surgeon: Kate Sable, MD;  Location: [facility];   Service: Cardiovascular;  Laterality: N/A;    Current Meds  Medication Sig   acetaminophen (TYLENOL) 325 MG tablet Take 325 mg by mouth every 6 (six) hours as needed for mild pain, fever, headache or moderate pain.   ALPRAZolam (XANAX) 0.25 MG tablet Take 0.25 mg by mouth daily as needed for anxiety.   amitriptyline (ELAVIL) 25 MG tablet TAKE 1 TABLET BY MOUTH DAILY   BIOTIN PO Take 1 tablet by mouth daily.   ELIQUIS 5 MG TABS tablet TAKE 1 TABLET(5 MG) BY MOUTH TWICE DAILY   ELMIRON 100 MG capsule TAKE 1 CAPSULE BY MOUTH THREE TIMES DAILY   flecainide (TAMBOCOR) 50 MG tablet Take 1 tablet (50 mg total) by mouth every 12 (twelve) hours.   furosemide (LASIX) 20 MG tablet Take 1 tablet (20 mg total) by mouth daily.   levothyroxine (SYNTHROID) 125 MCG tablet TAKE 1 TABLET(125 MCG) BY MOUTH DAILY   losartan-hydrochlorothiazide (HYZAAR) 100-12.5 MG tablet TAKE 1 TABLET BY MOUTH DAILY   melatonin 5 MG TABS Take 1 tablet (5 mg total) by mouth at bedtime.   Multiple Vitamin (MULTIVITAMIN) capsule Take 1 capsule by mouth daily.   naproxen sodium (ALEVE) 220 MG tablet Take 220 mg by mouth daily as needed.   omeprazole (PRILOSEC) 20 MG capsule TAKE 1 CAPSULE BY MOUTH DAILY   pindolol (VISKEN) 5 MG tablet Take 1/2 tablet BID as directed. Hold medication if HR<50. Use with caution when HR is between 50-60.   simvastatin (ZOCOR) 20 MG tablet TAKE 1 TABLET BY MOUTH DAILY    Allergies: Amoxicillin and Penicillins  Social History   Tobacco Use   Smoking status: Never   Smokeless tobacco: Never  Vaping Use   Vaping Use: Never used  Substance Use Topics   Alcohol use: No    Alcohol/week: 0.0 standard drinks   Drug use: No    Family History  Problem Relation Age of Onset   Heart disease Mother    Heart disease Father    Diabetes Father    Stroke Father    Arthritis Father     Review of Systems: A 12-system review of systems was performed and was negative except as noted in the  HPI.  --------------------------------------------------------------------------------------------------  Physical Exam: BP 110/60 (BP Location: Right Arm, Patient Position: Sitting, Cuff Size: Normal)    Pulse (!) 56    Ht 5\' 4"  (1.626 m)    Wt 158 lb (71.7 kg)    SpO2 96%    BMI 27.12 kg/m  Heart rate increased to 67 bpm with slow ambulation in the hallway.  General:  NAD.  Accompanied by her niece. Neck: No JVD or HJR. Lungs: Clear to auscultation bilaterally without wheezes or crackles. Heart: Bradycardic but regular with 1/6 systolic murmur.  No rubs or gallops. Abdomen: Soft, nontender, nondistended. Extremities: No lower extremity edema.  EKG:  Sinus bradycardia with isolated PVC as well as LVH with abnormal repolarization.  Lab Results  Component Value Date   WBC 7.8 08/30/2021   HGB 10.4 (L) 08/30/2021   HCT 30.8 (L) 08/30/2021   MCV 91.7 08/30/2021   PLT 238 08/30/2021    Lab Results  Component Value Date   NA 137 08/30/2021  K 4.5 08/30/2021   CL 103 08/30/2021   CO2 26 08/30/2021   BUN 27 (H) 08/30/2021   CREATININE 0.87 08/30/2021   GLUCOSE 104 (H) 08/30/2021   ALT 15 08/29/2021    No results found for: CHOL, HDL, LDLCALC, LDLDIRECT, TRIG, CHOLHDL  --------------------------------------------------------------------------------------------------  ASSESSMENT AND PLAN: Persistent atrial fibrillation and flutter as well as sinus bradycardia: Ms. Vanlaanen is feeling more fatigued since her recent hospitalization, potentially due to her bradycardia.  She demonstrates some increase in her heart rate with activity, though it only went up to 67 bpm while walking.  I think it is reasonable to continue holding pindolol at this time.  I will reach out to Dr. Caryl Wilcox to see if it is appropriate to continue flecainide therapy in the setting of no AV nodal blockade.  Ultimately, Ms. Cadwallader may need to undergo permanent pacemaker placement.  We will attempt to have her follow-up  with Dr. Caryl Wilcox as soon as possible.  I will also place a 14-day ZIO AT monitor.  Continue anticoagulation with apixaban 5 mg twice daily based on age, weight, and creatinine.  Chronic HFpEF: Ms. Kleinfeldt reports stable NYHA class III symptoms likely confounded by her bradycardia and deconditioning.  I encouraged her to continue working with physical therapy.  Continue current dose of furosemide 20 mg daily.  Due to borderline low blood pressure and fatigue, I have recommended having losartan-HCTZ to 50/6.25 mg daily.  Ultimately, it may be worthwhile to discontinue the HCTZ component altogether though we will defer this change today.  Hypertension: Blood pressure in the low normal range.  Given generalized fatigue and some lightheadedness, we will continue to hold pindolol and losartan-HCTZ in half.  Follow-up: Return to next month with Meagan Bayley, NP, as previously scheduled.  Addendum (09/17/21 11:13 AM): I have spoken with Dr. Caryl Wilcox, who recommends discontinuation of flecainide and pindolol.  We will reach out to Ms. Kahana to convey these recommendations.  Nelva Bush, MD 09/16/2021 1:55 PM

## 2021-09-16 ENCOUNTER — Other Ambulatory Visit: Payer: Self-pay

## 2021-09-16 ENCOUNTER — Ambulatory Visit (INDEPENDENT_AMBULATORY_CARE_PROVIDER_SITE_OTHER): Payer: Medicare PPO

## 2021-09-16 ENCOUNTER — Encounter: Payer: Self-pay | Admitting: Internal Medicine

## 2021-09-16 ENCOUNTER — Ambulatory Visit: Payer: Medicare PPO | Admitting: Internal Medicine

## 2021-09-16 VITALS — BP 110/60 | HR 56 | Ht 64.0 in | Wt 158.0 lb

## 2021-09-16 DIAGNOSIS — I4819 Other persistent atrial fibrillation: Secondary | ICD-10-CM | POA: Diagnosis not present

## 2021-09-16 DIAGNOSIS — R001 Bradycardia, unspecified: Secondary | ICD-10-CM

## 2021-09-16 DIAGNOSIS — I10 Essential (primary) hypertension: Secondary | ICD-10-CM

## 2021-09-16 DIAGNOSIS — I5032 Chronic diastolic (congestive) heart failure: Secondary | ICD-10-CM | POA: Diagnosis not present

## 2021-09-16 DIAGNOSIS — I480 Paroxysmal atrial fibrillation: Secondary | ICD-10-CM

## 2021-09-16 MED ORDER — LOSARTAN POTASSIUM-HCTZ 100-12.5 MG PO TABS: 0.5000 | ORAL_TABLET | Freq: Every day | ORAL | Status: DC

## 2021-09-16 NOTE — Telephone Encounter (Signed)
Noted  

## 2021-09-16 NOTE — Telephone Encounter (Signed)
Patient and family in office today .  Will schedule at checkout .  Spoke with niece .

## 2021-09-16 NOTE — Patient Instructions (Signed)
Medication Instructions:   Your physician has recommended you make the following change in your medication:   STOP Pindolol   DECREASE Losartan-HCTZ 100-12.5 mg - Take HALF tablet daily   *If you need a refill on your cardiac medications before your next appointment, please call your pharmacy*   Lab Work:  None ordered  Testing/Procedures:  Your physician has recommended that you wear a Zio AT (Live) monitor.  This will be mailed to you.   This monitor is a medical device that records the hearts electrical activity. Doctors most often use these monitors to diagnose arrhythmias. Arrhythmias are problems with the speed or rhythm of the heartbeat. The monitor is a small device applied to your chest. You can wear one while you do your normal daily activities. While wearing this monitor if you have any symptoms to push the button and record what you felt. Once you have worn this monitor for the period of time provider prescribed (Usually 14 days), you will return the monitor device in the postage paid box. Once it is returned they will download the data collected and provide [place] with a report which the provider will then review and we will call you with those results. Important tips:  Avoid showering during the first 24 hours of wearing the monitor. Avoid excessive sweating to help maximize wear time. Do not submerge the device, no hot tubs, and no swimming pools. Keep any lotions or oils away from the patch. After 24 hours you may shower with the patch on. Take brief showers with your back facing the shower head.  Do not remove patch once it has been placed because that will interrupt data and decrease adhesive wear time. Push the button when you have any symptoms and write down what you were feeling. Once you have completed wearing your monitor, remove and place into box which has postage paid and place in your outgoing mailbox.  If for some reason you have misplaced your box then call our  office and we can provide another box and/or mail it off for you.    Follow-Up: At [facility], you and your health needs are our priority.  As part of our continuing mission to provide you with exceptional heart care, we have created designated Provider Care Teams.  These Care Teams include your primary Cardiologist (physician) and Advanced Practice Providers (APPs -  Physician Assistants and Nurse Practitioners) who all work together to provide you with the care you need, when you need it.  We recommend signing up for the patient portal called "MyChart".  Sign up information is provided on this After Visit Summary.  MyChart is used to connect with patients for Virtual Visits (Telemedicine).  Patients are able to view lab/test results, encounter notes, upcoming appointments, etc.  Non-urgent messages can be sent to your provider as well.   To learn more about what you can do with MyChart, go to [email or web address].    Your next appointment:    1) Keep scheduled follow up with Ward Givens, NP   2) Schedule appt with Dr. Graciela Husbands (EP referral)  The format for your next appointment:   In Person

## 2021-09-16 NOTE — Telephone Encounter (Signed)
Fyi - Overbooked klein 2-23 at 0900 as next available April

## 2021-09-17 ENCOUNTER — Encounter: Payer: Self-pay | Admitting: Internal Medicine

## 2021-09-17 ENCOUNTER — Telehealth: Payer: Self-pay | Admitting: *Deleted

## 2021-09-17 DIAGNOSIS — I480 Paroxysmal atrial fibrillation: Secondary | ICD-10-CM

## 2021-09-17 DIAGNOSIS — R001 Bradycardia, unspecified: Secondary | ICD-10-CM

## 2021-09-17 NOTE — Telephone Encounter (Signed)
Pt's niece Clide Cliff returned call.  Notified of the plan below.  [name] voiced understanding and has no further questions.

## 2021-09-17 NOTE — Telephone Encounter (Signed)
Attempted to call [name], no answer. LMTCB.   Called and spoke with pt and made aware of Dr. Serita Kyle message below.  Pt voiced understanding.  Pt will stop flecainide.  Pt will continue to stay off pindolol.  Pt will proceed with zio monitor, that arrived today.  Pt will follow up with Dr. Graciela Husbands as scheduled 10/07/21.   Pt has no further questions at this time.  Will discuss with pt's niece on return call.

## 2021-09-17 NOTE — Telephone Encounter (Signed)
-----   Message from Yvonne Kendall, MD sent at 09/17/2021 11:16 AM EST ----- Hi [place],  Would you mind reaching out to Ms. Cochrane and her niece to let them know that I have spoken with Dr. Graciela Husbands and he recommends stopping flecainide and continuing to stay off pindolol pending further evaluation by him in the office.  We will proceed with ZIO AT as discussed yesterday as well.  Let me know if any questions or concerns come up.  Thanks.  Thayer Ohm

## 2021-09-18 DIAGNOSIS — I480 Paroxysmal atrial fibrillation: Secondary | ICD-10-CM | POA: Diagnosis not present

## 2021-09-18 DIAGNOSIS — R001 Bradycardia, unspecified: Secondary | ICD-10-CM | POA: Diagnosis not present

## 2021-09-20 DIAGNOSIS — M5136 Other intervertebral disc degeneration, lumbar region: Secondary | ICD-10-CM | POA: Diagnosis not present

## 2021-09-20 DIAGNOSIS — I480 Paroxysmal atrial fibrillation: Secondary | ICD-10-CM | POA: Diagnosis not present

## 2021-09-20 DIAGNOSIS — I110 Hypertensive heart disease with heart failure: Secondary | ICD-10-CM | POA: Diagnosis not present

## 2021-09-20 DIAGNOSIS — I4892 Unspecified atrial flutter: Secondary | ICD-10-CM | POA: Diagnosis not present

## 2021-09-20 DIAGNOSIS — I5032 Chronic diastolic (congestive) heart failure: Secondary | ICD-10-CM | POA: Diagnosis not present

## 2021-09-20 DIAGNOSIS — G894 Chronic pain syndrome: Secondary | ICD-10-CM | POA: Diagnosis not present

## 2021-09-20 DIAGNOSIS — N3010 Interstitial cystitis (chronic) without hematuria: Secondary | ICD-10-CM | POA: Diagnosis not present

## 2021-09-22 ENCOUNTER — Ambulatory Visit: Payer: Medicare PPO | Admitting: Medical

## 2021-09-22 DIAGNOSIS — Z1389 Encounter for screening for other disorder: Secondary | ICD-10-CM | POA: Diagnosis not present

## 2021-09-22 DIAGNOSIS — I4891 Unspecified atrial fibrillation: Secondary | ICD-10-CM | POA: Diagnosis not present

## 2021-09-22 DIAGNOSIS — I10 Essential (primary) hypertension: Secondary | ICD-10-CM | POA: Diagnosis not present

## 2021-09-22 DIAGNOSIS — Z0000 Encounter for general adult medical examination without abnormal findings: Secondary | ICD-10-CM | POA: Diagnosis not present

## 2021-09-22 DIAGNOSIS — I4892 Unspecified atrial flutter: Secondary | ICD-10-CM | POA: Diagnosis not present

## 2021-09-22 DIAGNOSIS — F419 Anxiety disorder, unspecified: Secondary | ICD-10-CM | POA: Diagnosis not present

## 2021-09-22 DIAGNOSIS — E039 Hypothyroidism, unspecified: Secondary | ICD-10-CM | POA: Diagnosis not present

## 2021-09-29 ENCOUNTER — Telehealth: Payer: Self-pay | Admitting: Internal Medicine

## 2021-09-29 NOTE — Telephone Encounter (Signed)
Called and spoke to pt.  Pt states that when she woke up after midnight this AM 09/29/21, a light was flashing on zio.  She called the customer service number on box and was told by [facility] that her monitor is no longer working.  Zio did confirm that all data from 2/3 - 2/14 is stored, however, no longer will record.  Pt due to take monitor off on 2/17.   Advised pt that she may go ahead and remove monitor since we have most of the 2 weeks completed.  Notified pt I will make Dr. Okey Dupre aware that results will be short approx 2-3 days.

## 2021-09-29 NOTE — Telephone Encounter (Signed)
Patient states having problem with Monitor light flashing called 800 number and is still flashing wants to know what need to do, please assist

## 2021-10-06 DIAGNOSIS — R197 Diarrhea, unspecified: Secondary | ICD-10-CM | POA: Diagnosis not present

## 2021-10-07 ENCOUNTER — Encounter: Payer: Self-pay | Admitting: Internal Medicine

## 2021-10-07 ENCOUNTER — Ambulatory Visit: Payer: Medicare PPO | Admitting: Internal Medicine

## 2021-10-07 ENCOUNTER — Other Ambulatory Visit: Payer: Self-pay

## 2021-10-07 VITALS — BP 130/60 | HR 66 | Ht 64.0 in | Wt 157.0 lb

## 2021-10-07 DIAGNOSIS — I484 Atypical atrial flutter: Secondary | ICD-10-CM

## 2021-10-07 DIAGNOSIS — I4819 Other persistent atrial fibrillation: Secondary | ICD-10-CM

## 2021-10-07 DIAGNOSIS — R001 Bradycardia, unspecified: Secondary | ICD-10-CM

## 2021-10-07 DIAGNOSIS — I10 Essential (primary) hypertension: Secondary | ICD-10-CM

## 2021-10-07 NOTE — Progress Notes (Signed)
Patient Care Team: Pcp, No as PCP - General End, Cristal Deer, MD as PCP - Cardiology (Cardiology) Byrnett, Merrily Pew, MD (General Surgery) Barnabas Lister Carlisle Beers, MD (Unknown Physician Specialty)   HPI  Meagan Wilcox is a 88 y.o. female seen in follow-up for atrial fibrillation/flutter for which she was admitted 1/23.  History of bradycardia was started on flecainide and low-dose nadolol.  Underwent cardioversion with restoration of sinus rhythm but then issues associated with bradycardia and weakness.  Had been recommended that we discontinue the nadolol and try an ISA beta-blocker. Because of ongoing bradycardia and fatigue, it was elected to try and go forward with neither.  She feels considerably better than she did in atrial fibrillation which upon ECG review was relatively rapid about 120.  No complaints at this point of chest pain or shortness of breath or edema.  No lightheadedness.  Notably, she lives by herself and her niece is very involved in her care  Event recorder from 2/23 was personally reviewed demonstrating no atrial fibrillation about 40 episodes of nonsustained atrial tachycardia lasting as long as 13 seconds with rates up to 190 or so.  Mean heart rate in sinus was 67 with a minimum of 49.   DATE TEST EF    9/20 Echo   55-60 % MR mild-mod  6/22 Echo   55-60 % DDysfunction Gd 2             Date Cr K Hgb  1/23 1.13 4.5  13.2            Records and Results Reviewed   Past Medical History:  Diagnosis Date   (HFpEF) heart failure with preserved ejection fraction ([facility])    a. 01/2021 Echo: EF 55-60%, no rwma, GrII DD, nl RV fxn, mod elev PASP. Mild MR, mild-mod TR.   Abnormal mammogram, unspecified 2011   Arthritis 2002   Chronic pain syndrome 08/01/2017   GERD (gastroesophageal reflux disease)    Heart murmur 1985   Hyperlipidemia    Hypertension 2005   Hypothyroidism    Persistent atrial fibrillation/Atypical atrial flutter ([facility])    a. 04/2019  TEE/DCCV; b. 05/2019 Recurrent Aflutter-->Amio-->DCCV; c. CHA2DS2VASc = 4-->Eliquis; d. 01/2021 Zio: Avg HR 55 (40-90), 5.1% PVC burden. 10 atrial runs (longest 18 beats, fastest 126). No pauses or afib/flutter. Triggered events = RSR, PVCs, and jxnl.    Past Surgical History:  Procedure Laterality Date   APPENDECTOMY  1939   BREAST BIOPSY Left 02-24-14   benign   BREAST BIOPSY Left 02/11/2016   neg   BREAST BIOPSY Left 2013   neg   BREAST EXCISIONAL BIOPSY Left 2014   neg   BREAST SURGERY Left 2013   stereotactic biopsy    BREAST SURGERY Left May 2010   1 mm foci of atypical ductal hyperplasia   CARDIOVERSION N/A 05/08/2019   Procedure: CARDIOVERSION;  Surgeon: Debbe Odea, MD;  Location: [facility];  Service: Cardiovascular;  Laterality: N/A;   CARDIOVERSION N/A 05/31/2019   Procedure: CARDIOVERSION;  Surgeon: Antonieta Iba, MD;  Location: [facility];  Service: Cardiovascular;  Laterality: N/A;   CARDIOVERSION N/A 08/25/2021   Procedure: CARDIOVERSION;  Surgeon: Debbe Odea, MD;  Location: [facility];  Service: Cardiovascular;  Laterality: N/A;   cataract surgery  Bilateral 2009   COLONOSCOPY  2010   Dr. Mechele Collin   left breast biopsy   2009   TEE WITHOUT CARDIOVERSION N/A 05/08/2019   Procedure: TRANSESOPHAGEAL ECHOCARDIOGRAM (TEE);  Surgeon: Azucena Cecil,  Arlys John, MD;  Location: [facility];  Service: Cardiovascular;  Laterality: N/A;    Current Meds  Medication Sig   acetaminophen (TYLENOL) 325 MG tablet Take 325 mg by mouth every 6 (six) hours as needed for mild pain, fever, headache or moderate pain.   amitriptyline (ELAVIL) 25 MG tablet TAKE 1 TABLET BY MOUTH DAILY   BIOTIN PO Take 1 tablet by mouth daily.   diphenoxylate-atropine (LOMOTIL) 2.5-0.025 MG tablet Take by mouth.   ELIQUIS 5 MG TABS tablet TAKE 1 TABLET(5 MG) BY MOUTH TWICE DAILY   ELMIRON 100 MG capsule TAKE 1 CAPSULE BY MOUTH THREE TIMES DAILY   furosemide (LASIX) 20 MG tablet Take 1 tablet (20 mg total)  by mouth daily.   levothyroxine (SYNTHROID) 125 MCG tablet TAKE 1 TABLET(125 MCG) BY MOUTH DAILY   LORazepam (ATIVAN) 0.5 MG tablet Take 1 tablet by mouth every 12 (twelve) hours as needed for anxiety.   losartan-hydrochlorothiazide (HYZAAR) 100-12.5 MG tablet Take 0.5 tablets by mouth daily.   melatonin 5 MG TABS Take 1 tablet (5 mg total) by mouth at bedtime.   Multiple Vitamin (MULTIVITAMIN) capsule Take 1 capsule by mouth daily.   naproxen sodium (ALEVE) 220 MG tablet Take 220 mg by mouth daily as needed.   omeprazole (PRILOSEC) 20 MG capsule TAKE 1 CAPSULE BY MOUTH DAILY   ondansetron (ZOFRAN) 8 MG tablet Take by mouth.   simvastatin (ZOCOR) 20 MG tablet TAKE 1 TABLET BY MOUTH DAILY    Allergies  Allergen Reactions   Amoxicillin Diarrhea   Penicillins Rash    Did it involve swelling of the face/tongue/throat, SOB, or low BP? No Did it involve sudden or severe rash/hives, skin peeling, or any reaction on the inside of your mouth or nose? No Did you need to seek medical attention at a hospital or doctor's office? Yes When did it last happen?      65 years ago If all above answers are NO, may proceed with cephalosporin use.      Review of Systems negative except from HPI and PMH  Physical Exam BP 130/60 (BP Location: Left Arm, Patient Position: Sitting, Cuff Size: Normal)    Pulse 66    Ht 5\' 4"  (1.626 m)    Wt 157 lb (71.2 kg)    SpO2 98%    BMI 26.95 kg/m  Well developed and well nourished in no acute distress HENT normal E scleral and icterus clear Neck Supple JVP flat; carotids brisk and full Clear to ausculation  Regular rate and rhythm, no murmurs gallops or rub Soft with active bowel sounds No clubbing cyanosis  Edema Alert and oriented, grossly normal motor and sensory function Skin Warm and Dry  ECG sinus at 66 Interval 17/10/39 LVH Otherwise normal  CrCl cannot be calculated (Patient's most recent lab result is older than the maximum 21 days  allowed.).   Assessment and  Plan  Atrial flutter atypical 2:1 conduction   HFpEF   Bradycardia   HTN  Currently holding sinus rhythm.  Much better than in atrial flutter/fib.  However, she is unable to tolerate AV nodal blockade because of bradycardia; we will continue her off therapy and anticipate repeat cardioversion as she reverts to atrial fibrillation/flutter and let the frequency of those events determine next steps i.e. Tikosyn or pacing and alternative antiarrhythmic therapy.  Lengthy discussion regarding the use of wearable technology to allow her to determine when it is that she reverts from sinus rhythm; we would anticipate a change  in the heart rate from the mid 60s--80s+.  If it persist for more than 48 hours she is to call us and we will get an electrocardiogram and then anticipate cardioversion.  No bleeding on the Eliquis.  Continue 2.5 mg twice daily.  Blood pressure is well controlled we will continue losartan HCT 100/12.5   Current medicines are reviewed at length with the patient today .  The patient does not  have concerns regarding medicines.

## 2021-10-07 NOTE — Patient Instructions (Addendum)
Medication Instructions:  ?- Your physician recommends that you continue on your current medications as directed. Please refer to the Current Medication list given to you today. ? ?*If you need a refill on your cardiac medications before your next appointment, please call your pharmacy* ? ? ?Lab Work: ?- none ordered ? ?If you have labs (blood work) drawn today and your tests are completely normal, you will receive your results only by: ?MyChart Message (if you have MyChart) OR ?A paper copy in the mail ?If you have any lab test that is abnormal or we need to change your treatment, we will call you to review the results. ? ? ?Testing/Procedures: ?- none ordered ? ? ?Follow-Up: ?At [facility], you and your health needs are our priority.  As part of our continuing mission to provide you with exceptional heart care, we have created designated Provider Care Teams.  These Care Teams include your primary Cardiologist (physician) and Advanced Practice Providers (APPs -  Physician Assistants and Nurse Practitioners) who all work together to provide you with the care you need, when you need it. ? ?We recommend signing up for the patient portal called "MyChart".  Sign up information is provided on this After Visit Summary.  MyChart is used to connect with patients for Virtual Visits (Telemedicine).  Patients are able to view lab/test results, encounter notes, upcoming appointments, etc.  Non-urgent messages can be sent to your provider as well.   ?To learn more about what you can do with MyChart, go to [email or web address].   ? ?Your next appointment:   ?6 month(s) ? ?The format for your next appointment:   ?In Person ? ?Provider:   ?Steven Klein, MD  ? ? ?Other Instructions ?N/a ? ?

## 2021-10-21 ENCOUNTER — Other Ambulatory Visit: Payer: Self-pay | Admitting: Internal Medicine

## 2021-10-27 ENCOUNTER — Ambulatory Visit: Payer: Medicare PPO | Admitting: Nurse Practitioner

## 2021-10-27 ENCOUNTER — Encounter: Payer: Self-pay | Admitting: Nurse Practitioner

## 2021-10-27 ENCOUNTER — Other Ambulatory Visit: Payer: Self-pay

## 2021-10-27 VITALS — BP 152/70 | HR 65 | Ht 63.0 in | Wt 157.5 lb

## 2021-10-27 DIAGNOSIS — I4892 Unspecified atrial flutter: Secondary | ICD-10-CM

## 2021-10-27 DIAGNOSIS — I5032 Chronic diastolic (congestive) heart failure: Secondary | ICD-10-CM | POA: Diagnosis not present

## 2021-10-27 DIAGNOSIS — I4891 Unspecified atrial fibrillation: Secondary | ICD-10-CM | POA: Diagnosis not present

## 2021-10-27 DIAGNOSIS — I4819 Other persistent atrial fibrillation: Secondary | ICD-10-CM | POA: Diagnosis not present

## 2021-10-27 DIAGNOSIS — I10 Essential (primary) hypertension: Secondary | ICD-10-CM

## 2021-10-27 MED ORDER — LOSARTAN POTASSIUM-HCTZ 100-12.5 MG PO TABS: 1.0000 | ORAL_TABLET | Freq: Every day | ORAL | 3 refills | Status: DC

## 2021-10-27 NOTE — Progress Notes (Signed)
? ? ?Office Visit  ?  ?Patient Name: Meagan Wilcox ?Date of Encounter: 10/27/2021 ? ?Primary Care Provider:  Pcp, No ?Primary Cardiologist:  Nelva Bush, MD ? ?Chief Complaint  ?  ?88 year old female with a history of persistent atrial fibrillation/atypical atrial flutter, hypertension, hyperlipidemia, hypothyroidism, GERD, HFpEF, PSVT, and arthritis, who presents for follow-up related to atrial fibrillation. ? ?Past Medical History  ?  ?Past Medical History:  ?Diagnosis Date  ? (HFpEF) heart failure with preserved ejection fraction ([place])   ? a. 01/2021 Echo: EF 55-60%, no rwma, GrII DD, nl RV fxn, mod elev PASP. Mild MR, mild-mod TR.  ? Abnormal mammogram, unspecified 2011  ? Arthritis 2002  ? Chronic pain syndrome 08/01/2017  ? GERD (gastroesophageal reflux disease)   ? Heart murmur 1985  ? Hyperlipidemia   ? Hypertension 2005  ? Hypothyroidism   ? Persistent atrial fibrillation/Atypical atrial flutter ([place])   ? a. 04/2019 TEE/DCCV; b. 05/2019 Recurrent Aflutter-->Amio-->DCCV; c. CHA2DS2VASc = 4-->Eliquis; d. 08/2021 recurrent Afib req DCCV.  Flecainide started; e. 09/2021 Flecainide d/c'd 2/2 brady - couldn't tol bb.  ? PSVT (paroxysmal supraventricular tachycardia) ([place])   ? a. 09/2021 Zio: Predominantly sinus rhythm (67; range 49-102).  Rare PACs with occasional PVCs (PVC burden 3.6%).  44 SVT runs-fastest 190, longest 12.9 seconds.  No sustained arrhythmias/prolonged pauses.  No significant arrhythmia identified corresponding to patient's symptoms.  ? ?Past Surgical History:  ?Procedure Laterality Date  ? APPENDECTOMY  1939  ? BREAST BIOPSY Left 02-24-14  ? benign  ? BREAST BIOPSY Left 02/11/2016  ? neg  ? BREAST BIOPSY Left 2013  ? neg  ? BREAST EXCISIONAL BIOPSY Left 2014  ? neg  ? BREAST SURGERY Left 2013  ? stereotactic biopsy   ? BREAST SURGERY Left May 2010  ? 1 mm foci of atypical ductal hyperplasia  ? CARDIOVERSION N/A 05/08/2019  ? Procedure: CARDIOVERSION;  Surgeon: Kate Sable, MD;  Location:  [facility] ORS;  Service: Cardiovascular;  Laterality: N/A;  ? CARDIOVERSION N/A 05/31/2019  ? Procedure: CARDIOVERSION;  Surgeon: Minna Merritts, MD;  Location: [facility] ORS;  Service: Cardiovascular;  Laterality: N/A;  ? CARDIOVERSION N/A 08/25/2021  ? Procedure: CARDIOVERSION;  Surgeon: Kate Sable, MD;  Location: [facility] ORS;  Service: Cardiovascular;  Laterality: N/A;  ? cataract surgery  Bilateral 2009  ? COLONOSCOPY  2010  ? Dr. Vira Agar  ? left breast biopsy   2009  ? TEE WITHOUT CARDIOVERSION N/A 05/08/2019  ? Procedure: TRANSESOPHAGEAL ECHOCARDIOGRAM (TEE);  Surgeon: Kate Sable, MD;  Location: [facility] ORS;  Service: Cardiovascular;  Laterality: N/A;  ? ? ?Allergies ? ?Allergies  ?Allergen Reactions  ? Amoxicillin Diarrhea  ? Penicillins Rash  ?  Did it involve swelling of the face/tongue/throat, SOB, or low BP? No ?Did it involve sudden or severe rash/hives, skin peeling, or any reaction on the inside of your mouth or nose? No ?Did you need to seek medical attention at a hospital or doctor's office? Yes ?When did it last happen?      65 years ago ?If all above answers are ?NO?, may proceed with cephalosporin use.  ? ? ?History of Present Illness  ?  ?88 year old female with above past medical history including persistent atrial fibrillation/flutter, hypertension, hyperlipidemia, HFpEF, hypothyroidism, GERD, and arthritis.  In September 2020, she was diagnosed with atrial fibrillation/atypical atrial flutter, and underwent TEE guided cardioversion.  She had recurrent tachypalpitations October 2020, was found to be in atrial flutter with 2:1 conduction.  She was placed on  amiodarone and underwent repeat cardioversion in October 2020.  Amiodarone dose was reduced to 200 mg daily November 2020 in the setting of nausea and fatigue.  This was reduced further to 100 mg daily setting of nausea and fatigue in June 2021.  In May 2022, she experienced dyspnea and fatigue as well as bradycardia on home monitoring.   Formal event monitoring showed an average heart rate of 55 bpm with a 5.1% PVC burden, 10 brief atrial runs, and no evidence of recurrent A-fib/flutter.  Triggered events were associated sinus rhythm and PVCs, as well as junctional rhythm.  Echocardiogram at that time showed an EF 55 to 60% with grade 2 diastolic dysfunction.  Amiodarone was subsequently discontinued in July 2022 in the setting of frequent nausea and dizziness. ? ?Ms. Reding was admitted to [facility] in early January in the setting of fatigue and palpitations, was noted to be in atrial flutter with rates in the 160s.  Rate did not change significantly with IV diltiazem and she was seen by EP and placed on flecainide 50 mg twice daily and nadolol.  She subsequently underwent TEE and cardioversion and was noted to be bradycardic post cardioversion.  Nadolol was discontinued in favor of pindolol.  She was seen in follow-up on February 2, secondary to bradycardia noted on home monitoring.  Beta-blocker therapy was held and given inability to tolerate ? blocker, flecainide was d/c'd as well.  ZIO monitor was placed.  This showed no A-fib/flutter.  44 brief runs of atrial tachycardia were noted as outlined in the past medical history.  She was seen by electrophysiology on February 23rd, with recommendation to continue off of therapy.  It was felt that if/when she had recurrent atrial fibrillation/flutter, she would require cardioversion, and potentially alternate antiarrhythmic therapy and/or pacing for tachybrady syndrome.  Since her electrophysiology visit on February 23, she has mostly done well.  Her blood pressures have been trending in the 140s and above at home.  Last Friday evening, she was having computer issues and became very upset and anxious.  She noted elevated heart rates.  One of her nieces came to her home and document a heart rate as high as the 150s.  Her niece, who is present with her today, notes that Ms. Overcash was very  anxious.  They gave her a Xanax and within 2 hours, her heart rate had normalized.  She has had no recurrence of palpitations, and otherwise has felt well.  Even when she was experiencing tachycardia, she was not having any chest pain or dyspnea.  She denies PND, orthopnea, dizziness, syncope, edema, or early satiety. ? ?Home Medications  ?  ?Current Outpatient Medications  ?Medication Sig Dispense Refill  ? acetaminophen (TYLENOL) 325 MG tablet Take 325 mg by mouth every 6 (six) hours as needed for mild pain, fever, headache or moderate pain.    ? amitriptyline (ELAVIL) 25 MG tablet TAKE 1 TABLET BY MOUTH DAILY 90 tablet 3  ? BIOTIN PO Take 1 tablet by mouth daily.    ? ELIQUIS 5 MG TABS tablet TAKE 1 TABLET(5 MG) BY MOUTH TWICE DAILY 180 tablet 1  ? ELMIRON 100 MG capsule TAKE 1 CAPSULE BY MOUTH THREE TIMES DAILY 90 capsule 3  ? furosemide (LASIX) 20 MG tablet Take 1 tablet (20 mg total) by mouth daily. 30 tablet   ? levothyroxine (SYNTHROID) 125 MCG tablet TAKE 1 TABLET(125 MCG) BY MOUTH DAILY 90 tablet 3  ? LORazepam (ATIVAN) 0.5 MG tablet Take 1  tablet by mouth every 12 (twelve) hours as needed for anxiety.    ? melatonin 5 MG TABS Take 1 tablet (5 mg total) by mouth at bedtime.  0  ? Multiple Vitamin (MULTIVITAMIN) capsule Take 1 capsule by mouth daily.    ? naproxen sodium (ALEVE) 220 MG tablet Take 220 mg by mouth daily as needed.    ? omeprazole (PRILOSEC) 20 MG capsule TAKE 1 CAPSULE BY MOUTH DAILY 90 capsule 3  ? simvastatin (ZOCOR) 20 MG tablet TAKE 1 TABLET BY MOUTH DAILY 90 tablet 3  ? ALPRAZolam (XANAX) 0.25 MG tablet Take 0.25 mg by mouth daily as needed for anxiety. (Patient not taking: Reported on 10/07/2021)    ? losartan-hydrochlorothiazide (HYZAAR) 100-12.5 MG tablet Take 1 tablet by mouth daily. 90 tablet 3  ? ?No current facility-administered medications for this visit.  ?  ? ?Review of Systems  ?  ?2-hour episode of elevated heart rates last week without associated symptoms.  She was anxious  preceding that episode.  She has been having bouts of diarrhea which has improved but not completely resolved.  She has follow-up with GI.  She denies chest pain, dyspnea, PND, orthopnea, dizziness, syncope, edema

## 2021-10-27 NOTE — Patient Instructions (Signed)
Medication Instructions:  ?Your physician has recommended you make the following change in your medication:  ? ?TAKE whole tablet of your Losartan-Hydrochlorothiazide 100-12.5 mg once daily.  ? ?*If you need a refill on your cardiac medications before your next appointment, please call your pharmacy* ? ? ?Lab Work: ?BMET in one week.  ? ?If you have labs (blood work) drawn today and your tests are completely normal, you will receive your results only by: ?MyChart Message (if you have MyChart) OR ?A paper copy in the mail ?If you have any lab test that is abnormal or we need to change your treatment, we will call you to review the results. ? ? ?Testing/Procedures: ?None ? ? ?Follow-Up: ?At [facility], you and your health needs are our priority.  As part of our continuing mission to provide you with exceptional heart care, we have created designated Provider Care Teams.  These Care Teams include your primary Cardiologist (physician) and Advanced Practice Providers (APPs -  Physician Assistants and Nurse Practitioners) who all work together to provide you with the care you need, when you need it. ? ? ?Your next appointment:   ?2 month(s) ? ?The format for your next appointment:   ?In Person ? ?Provider:   ?Yvonne Kendall, MD or Nicolasa Ducking, NP ?

## 2021-11-03 ENCOUNTER — Other Ambulatory Visit: Payer: Medicare PPO

## 2021-11-03 ENCOUNTER — Other Ambulatory Visit
Admission: RE | Admit: 2021-11-03 | Discharge: 2021-11-03 | Disposition: A | Discharge status: Home / Self Care | Payer: Medicare PPO | Attending: Nurse Practitioner | Admitting: Nurse Practitioner

## 2021-11-03 ENCOUNTER — Other Ambulatory Visit: Payer: Self-pay

## 2021-11-03 DIAGNOSIS — I5032 Chronic diastolic (congestive) heart failure: Secondary | ICD-10-CM

## 2021-11-03 LAB — BASIC METABOLIC PANEL
Anion gap: 8 (ref 5–15)
BUN: 16 mg/dL (ref 8–23)
CO2: 28 mmol/L (ref 22–32)
Calcium: 9.9 mg/dL (ref 8.9–10.3)
Chloride: 99 mmol/L (ref 98–111)
Creatinine, Ser: 0.87 mg/dL (ref 0.44–1.00)
GFR, Estimated: >60 mL/min (ref >60)
Glucose, Bld: 108 mg/dL — ABNORMAL HIGH (ref 70–99)
Potassium: 4.2 mmol/L (ref 3.5–5.1)
Sodium: 135 mmol/L (ref 135–145)

## 2021-11-17 DIAGNOSIS — E039 Hypothyroidism, unspecified: Secondary | ICD-10-CM | POA: Diagnosis not present

## 2021-11-23 DIAGNOSIS — K219 Gastro-esophageal reflux disease without esophagitis: Secondary | ICD-10-CM | POA: Diagnosis not present

## 2021-11-23 DIAGNOSIS — R110 Nausea: Secondary | ICD-10-CM | POA: Diagnosis not present

## 2021-11-23 DIAGNOSIS — Z7901 Long term (current) use of anticoagulants: Secondary | ICD-10-CM | POA: Diagnosis not present

## 2021-11-23 DIAGNOSIS — R197 Diarrhea, unspecified: Secondary | ICD-10-CM | POA: Diagnosis not present

## 2021-11-24 ENCOUNTER — Telehealth (INDEPENDENT_AMBULATORY_CARE_PROVIDER_SITE_OTHER): Payer: Medicare PPO | Admitting: Internal Medicine

## 2021-11-24 DIAGNOSIS — I4819 Other persistent atrial fibrillation: Secondary | ICD-10-CM | POA: Diagnosis not present

## 2021-11-24 NOTE — Telephone Encounter (Signed)
?  STAT if HR is under 50 or over 120 ?(normal HR is 60-100 beats per minute) ? ?What is your heart rate? 80-160 ? ?Do you have a log of your heart rate readings (document readings)? 3:30 pm 130/90 HR 86 ? ?Do you have any other symptoms? Pt said, she is concern about her HR because it keeps fluctuating from 80s to 160s. ?

## 2021-11-25 NOTE — Telephone Encounter (Signed)
Patient calling to follow up. She states it is okay to leave a detailed. She also says if she needs to be worked in today to call her niece, because she will bring her. Niece's phone: [phone]  ?

## 2021-11-25 NOTE — Telephone Encounter (Addendum)
Called and spoke with pt. Pt c/o fluctuating HR since Tuesday this week ranging 78-171. Tuesday evening symptoms began and pt diaphoretic with fast HR. Pt reports no further diaphoresis since Tuesday, but HR has continued to fluctuate intermittently. Continued symptoms include light headedness and incr weakness.  ?Pt states this AM at 0745 BP 143/91 HR 112, then rechecked at 0830 BP 124/67  HR 78. Pt states her HR has not gone below 78 that she has seen. Pt denies shortness of breath. Does feel stable at time of call. Pt confirms she has not missed any doses of Eliquis.  ? ?Pt last seen by Ward Givens, NP 10/27/21. Was seen by Dr. Graciela Husbands 10/07/21.  ?Pt unable to tolerate AV nodal blockade because of bradycardia.  ? ?Per note with Dr. Graciela Husbands 2/23:  ? ?we will continue her off therapy and anticipate repeat cardioversion as she reverts to atrial fibrillation/flutter and let the frequency of those events determine next steps i.e. Tikosyn or pacing and alternative antiarrhythmic therapy ? ?Discussed pt with Dr. Graciela Husbands this morning in office. Verbal orders received to schedule pt for cardioversion and follow up with Dr. Graciela Husbands in the office next week.  ? ?Discussed with pt, pt agreeable to proceed with cardioversion that was discussed at office visit.  ?Pt has been scheduled for DCCV at [facility] 11/30/21 at 0730 with Dr. Okey Dupre.  ? ?Follow up with Dr. Graciela Husbands scheduled 12/02/21 at 9:00 AM.  ? ?Lab and nurse visit for EKG appt scheduled this Friday 11/26/21.  ?Will give instruction letter to pt at nurse visit.  ? ?ER precautions provided.  ?Pt voiced understanding and has no further questions at this time.  ? ?I will call her niece, Clide Cliff, after 1PM today per pt request to discuss plan.  ? ? ?You are scheduled for a Cardioversion on Tuesday 11/30/21 with Dr. Okey Dupre.  ? ?[facility] at 6:30 a.m. on the day of your procedure. ? ?DIET INSTRUCTIONS:  ? ?Nothing to eat or drink after midnight except your medications  with a sip of water. ? ?      ?Labs: CBC/BMET to be done 11/26/21 in cardiology office ? ?Medications:  HOLD Furosemide morning of test.  ?      YOU MAY TAKE ALL of your remaining medications with a small amount of water. ? ?Must have a responsible person to drive you home. ? ?Bring a current list of your medications and current insurance cards.  ? ? ?If you have any questions after you get home, please call the office at [phone]  ? ? ?Will forward to Dr. Graciela Husbands to place orders for cardioversion.  ?

## 2021-11-26 ENCOUNTER — Ambulatory Visit: Payer: Medicare PPO | Admitting: *Deleted

## 2021-11-26 ENCOUNTER — Other Ambulatory Visit (INDEPENDENT_AMBULATORY_CARE_PROVIDER_SITE_OTHER): Payer: Medicare PPO

## 2021-11-26 DIAGNOSIS — I4819 Other persistent atrial fibrillation: Secondary | ICD-10-CM | POA: Diagnosis not present

## 2021-11-26 DIAGNOSIS — I4891 Unspecified atrial fibrillation: Secondary | ICD-10-CM

## 2021-11-26 NOTE — Telephone Encounter (Signed)
Pt in office today for lab visit and EKG for pre procedure. ? ?EKG shows SR with PAC's. HR 100. DOD signed off on EKG.  ? ?Cardioversion has been cancelled for Tuesday 4/18. Spoke with Britta Mccreedy in scheduling.  ? ?Pt has follow up scheduled with Dr. Graciela Husbands 12/02/21 at 9 AM. ? ?Pt does have continued fluctuation of HR. Pt brought in BP/HR readings today copied below: ? ?11/24/21  BP/HR ? ?7:40 am - 140/80  82 ?12:43 pm - 160/80  159 ?1:19 pm - 144/86  157 ?2:42 pm - 130/80  171 ?3:35 pm - 130/90  86 ?5:50 pm - 92/67  83 ? ?11/25/21 ? ?7:45 am - 143/91  112 ?8:00 am - 151/90 111 ?11:37 am - 146/70  80 ?5:37 pm - 141/71  75 ? ?11/26/21 ? ?6:41 am - 137/80  118 ?8:45 am - 136/72  78 ?9:55 am - 140/72  76 ? ?Daughter with pt, and asks what pt needs to do in the meantime. Pt asymptomatic at time of visit today. But does report periods of light-headedness and weakness intermittently. Asks if a PRN med should be prescribed for elevated heart rates since h/o intolerance to amiodarone, flecainide and pindolol.  ? ?Pt does still have Flecainide and Pendolol on hand at home.  ? ?ER precautions provided. Advised pt keep follow up next week. And will forward to Dr. Graciela Husbands for further recc at this time. Pt and daughter voiced understanding.  ? ?

## 2021-11-27 LAB — BASIC METABOLIC PANEL
BUN/Creatinine Ratio: 16 (ref 12–28)
BUN: 17 mg/dL (ref 8–27)
CO2: 16 mmol/L — ABNORMAL LOW (ref 20–29)
Calcium: 9.9 mg/dL (ref 8.7–10.3)
Chloride: 101 mmol/L (ref 96–106)
Creatinine, Ser: 1.07 mg/dL — ABNORMAL HIGH (ref 0.57–1.00)
Glucose: 146 mg/dL — ABNORMAL HIGH (ref 70–99)
Potassium: 3.8 mmol/L (ref 3.5–5.2)
Sodium: 139 mmol/L (ref 134–144)
eGFR: 50 mL/min/{1.73_m2} — ABNORMAL LOW (ref >59)

## 2021-11-27 LAB — CBC
Hematocrit: 40.8 % (ref 34.0–46.6)
Hemoglobin: 13.6 g/dL (ref 11.1–15.9)
MCH: 30.6 pg (ref 26.6–33.0)
MCHC: 33.3 g/dL (ref 31.5–35.7)
MCV: 92 fL (ref 79–97)
Platelets: 273 10*3/uL (ref 150–450)
RBC: 4.45 x10E6/uL (ref 3.77–5.28)
RDW: 12.8 % (ref 11.7–15.4)
WBC: 6.8 10*3/uL (ref 3.4–10.8)

## 2021-11-30 ENCOUNTER — Encounter: Admission: RE | Payer: Self-pay | Source: Home / Self Care

## 2021-11-30 ENCOUNTER — Ambulatory Visit: Admission: RE | Admit: 2021-11-30 | Payer: Medicare PPO | Source: Home / Self Care | Admitting: Internal Medicine

## 2021-11-30 SURGERY — CARDIOVERSION
Anesthesia: General

## 2021-11-30 NOTE — Telephone Encounter (Signed)
Called pt and made aware that Dr. Graciela Husbands will review with pt at office visit this week 12/02/21.  ?Pt appreciative and voiced understanding.  ?No further questions at this time.  ?

## 2021-12-02 ENCOUNTER — Ambulatory Visit: Payer: Medicare PPO | Admitting: Internal Medicine

## 2021-12-02 ENCOUNTER — Encounter: Payer: Self-pay | Admitting: Internal Medicine

## 2021-12-02 VITALS — BP 132/48 | HR 66 | Ht 63.0 in | Wt 154.0 lb

## 2021-12-02 DIAGNOSIS — R001 Bradycardia, unspecified: Secondary | ICD-10-CM

## 2021-12-02 DIAGNOSIS — I5032 Chronic diastolic (congestive) heart failure: Secondary | ICD-10-CM

## 2021-12-02 DIAGNOSIS — I4819 Other persistent atrial fibrillation: Secondary | ICD-10-CM | POA: Diagnosis not present

## 2021-12-02 DIAGNOSIS — I484 Atypical atrial flutter: Secondary | ICD-10-CM

## 2021-12-02 MED ORDER — VERAPAMIL HCL 40 MG PO TABS: ORAL_TABLET | ORAL | 0 refills | Status: DC

## 2021-12-02 NOTE — Progress Notes (Signed)
? ? ? ? ?Patient Care Team: ?Lauro Regulus, MD as PCP - General (Internal Medicine) ?Yvonne Kendall, MD as PCP - Cardiology (Cardiology) ?[name], MD (General Surgery) ?[place], Carlisle Beers, MD (Unknown Physician Specialty) ? ? ?HPI ? ?Meagan Wilcox is a 88 y.o. female seen in follow-up for atrial fibrillation/flutter for which she was admitted 1/23.  History of bradycardia; started on flecainide and low-dose nadolol.  Underwent cardioversion with restoration of sinus rhythm but then issues associated with bradycardia and weakness.   discontinued the nadolol and tried an ISA beta-blocker. ?Because of ongoing bradycardia and fatigue, it was elected to try and go forward with neither. ? ?She feels considerably better than she did in atrial fibrillation which upon ECG review was relatively rapid about 120. ? ?Event recorder from 2/23 was personally reviewed demonstrating no atrial fibrillation; about 40 episodes of nonsustained atrial tachycardia lasting as long as 13 seconds with rates up to 190 or so. ? ?Mean heart rate in sinus was 67 with a minimum of 49. ? ?Recurrent tachycardia 4/23 associated with lightheadedness shortness of breath and chest discomfort; lasted about 8 hours and then the heart rate reverted into the 80s with some brief excursions into the 110 range.  By the time she showed up at the office the next day she was in sinus rhythm   ? ?Otherwise functionally stable ? ?She comes with her niece, [name] ? ?DATE TEST EF    ?9/20 Echo   55-60 % MR mild-mod  ?6/22 Echo   55-60 % DDysfunction Gd 2  ?         ?  ?Date Cr K Hgb  ?1/23 1.13 4.5  13.2  ?         ? ?Thromboembolic risk factors ( age -84, HTN-1, Gender-1) for a CHADSVASc Score of >=4 ? ? ? ?Records and Results Reviewed  ? ?Past Medical History:  ?Diagnosis Date  ? (HFpEF) heart failure with preserved ejection fraction ([facility])   ? a. 01/2021 Echo: EF 55-60%, no rwma, GrII DD, nl RV fxn, mod elev PASP. Mild MR, mild-mod TR.  ?  Abnormal mammogram, unspecified 2011  ? Arthritis 2002  ? Chronic pain syndrome 08/01/2017  ? GERD (gastroesophageal reflux disease)   ? Heart murmur 1985  ? Hyperlipidemia   ? Hypertension 2005  ? Hypothyroidism   ? Persistent atrial fibrillation/Atypical atrial flutter ([facility])   ? a. 04/2019 TEE/DCCV; b. 05/2019 Recurrent Aflutter-->Amio-->DCCV; c. CHA2DS2VASc = 4-->Eliquis; d. 08/2021 recurrent Afib req DCCV.  Flecainide started; e. 09/2021 Flecainide d/c'd 2/2 brady - couldn't tol bb.  ? PSVT (paroxysmal supraventricular tachycardia) ([facility])   ? a. 09/2021 Zio: Predominantly sinus rhythm (67; range 49-102).  Rare PACs with occasional PVCs (PVC burden 3.6%).  44 SVT runs-fastest 190, longest 12.9 seconds.  No sustained arrhythmias/prolonged pauses.  No significant arrhythmia identified corresponding to patient's symptoms.  ? ? ?Past Surgical History:  ?Procedure Laterality Date  ? APPENDECTOMY  1939  ? BREAST BIOPSY Left 02-24-14  ? benign  ? BREAST BIOPSY Left 02/11/2016  ? neg  ? BREAST BIOPSY Left 2013  ? neg  ? BREAST EXCISIONAL BIOPSY Left 2014  ? neg  ? BREAST SURGERY Left 2013  ? stereotactic biopsy   ? BREAST SURGERY Left May 2010  ? 1 mm foci of atypical ductal hyperplasia  ? CARDIOVERSION N/A 05/08/2019  ? Procedure: CARDIOVERSION;  Surgeon: Debbe Odea, MD;  Location: [facility] ORS;  Service: Cardiovascular;  Laterality: N/A;  ?  CARDIOVERSION N/A 05/31/2019  ? Procedure: CARDIOVERSION;  Surgeon: Antonieta Iba, MD;  Location: [facility] ORS;  Service: Cardiovascular;  Laterality: N/A;  ? CARDIOVERSION N/A 08/25/2021  ? Procedure: CARDIOVERSION;  Surgeon: Debbe Odea, MD;  Location: [facility] ORS;  Service: Cardiovascular;  Laterality: N/A;  ? cataract surgery  Bilateral 2009  ? COLONOSCOPY  2010  ? Dr. Mechele Collin  ? left breast biopsy   2009  ? TEE WITHOUT CARDIOVERSION N/A 05/08/2019  ? Procedure: TRANSESOPHAGEAL ECHOCARDIOGRAM (TEE);  Surgeon: Debbe Odea, MD;  Location: [facility] ORS;  Service: Cardiovascular;   Laterality: N/A;  ? ? ?Current Meds  ?Medication Sig  ? acetaminophen (TYLENOL) 325 MG tablet Take 325 mg by mouth every 6 (six) hours as needed for mild pain, fever, headache or moderate pain.  ? amitriptyline (ELAVIL) 25 MG tablet TAKE 1 TABLET BY MOUTH DAILY (Patient taking differently: Take 25 mg by mouth at bedtime.)  ? BIOTIN PO Take 1 tablet by mouth daily.  ? Camphor-Menthol-Methyl Sal (SALONPAS) 3.08-20-08 % PTCH Place 1 patch onto the skin daily as needed (back pain).  ? ELIQUIS 5 MG TABS tablet TAKE 1 TABLET(5 MG) BY MOUTH TWICE DAILY  ? ELMIRON 100 MG capsule TAKE 1 CAPSULE BY MOUTH THREE TIMES DAILY (Patient taking differently: 100 mg 2 (two) times daily.)  ? furosemide (LASIX) 20 MG tablet Take 1 tablet (20 mg total) by mouth daily.  ? levothyroxine (SYNTHROID) 125 MCG tablet TAKE 1 TABLET(125 MCG) BY MOUTH DAILY  ? LORazepam (ATIVAN) 0.5 MG tablet Take 1 tablet by mouth every 12 (twelve) hours as needed for anxiety.  ? losartan-hydrochlorothiazide (HYZAAR) 100-12.5 MG tablet Take 1 tablet by mouth daily.  ? melatonin 5 MG TABS Take 1 tablet (5 mg total) by mouth at bedtime. (Patient taking differently: Take 5 mg by mouth at bedtime as needed (Sleep).)  ? Multiple Vitamin (MULTIVITAMIN) capsule Take 1 capsule by mouth daily.  ? naproxen sodium (ALEVE) 220 MG tablet Take 220 mg by mouth daily as needed.  ? omeprazole (PRILOSEC) 20 MG capsule TAKE 1 CAPSULE BY MOUTH DAILY  ? psyllium (METAMUCIL SMOOTH TEXTURE) 28 % packet Take 1 packet by mouth 2 (two) times daily as needed (Constipation).  ? simvastatin (ZOCOR) 20 MG tablet TAKE 1 TABLET BY MOUTH DAILY  ? ? ?Allergies  ?Allergen Reactions  ? Amoxicillin Diarrhea  ? Penicillins Rash  ?  Did it involve swelling of the face/tongue/throat, SOB, or low BP? No ?Did it involve sudden or severe rash/hives, skin peeling, or any reaction on the inside of your mouth or nose? No ?Did you need to seek medical attention at a hospital or doctor's office? Yes ?When did  it last happen?      65 years ago ?If all above answers are ?NO?, may proceed with cephalosporin use.  ? ? ? ? ?Review of Systems negative except from HPI and PMH ? ?Physical Exam ?BP (!) 132/48 (BP Location: Right Arm, Patient Position: Sitting, Cuff Size: Normal)   Pulse 66   Ht 5\' 3"  (1.6 m)   Wt 154 lb (69.9 kg)   SpO2 95%   BMI 27.28 kg/m?  ?Well developed and nourished in no acute distress ?HENT normal ?Neck supple   ?Clear ?Regular rate and rhythm, no murmurs or gallops ?Abd-soft  ?No Clubbing cyanosis edema ?Skin-warm and dry ?A & Oriented  Grossly normal sensory and motor function ? ?ECG sinus at 66 ?Interval 16/10/40 ?PVC-isolated ?RSR prime ? ?Estimated Creatinine Clearance: 33.4 mL/min (A) (by C-G  formula based on SCr of 1.07 mg/dL (H)). ? ? ?Assessment and  Plan ? ?Atrial flutter atypical 2:1 conduction ?  ?HFpEF ?  ?Bradycardia ?  ?HTN ? ?PVCs ? ?Interval episode of tachypalpitations likely atrial fibrillation although without documentation.  Discussed again strategies and the challenges of navigating her bradycardia.  We discussed the role of backup bradycardia pacing and more chronic rate control versus as needed rate control.  We have elected the latter using verapamil 40 mg every 4 hours x3 doses as needed. ? ?In the event that this is unsuccessful, we would need to choose either between backup bradycardia pacing and AV nodal blocking drugs and or monitoring to see whether dofetilide would be an appropriate antiarrhythmic therapy that might obviate the need for backup bradycardia pacing ? ?No bleeding.  She will continue her on her Eliquis at 2.5 mg daily. ?Blood pressure reasonably controlled.  Continue losartan 100/12.5 ?  ? ? ?Current medicines are reviewed at length with the patient today .  The patient does not  have concerns regarding medicines.  ?

## 2021-12-02 NOTE — Patient Instructions (Addendum)
Medication Instructions:  ?-  Your physician has recommended you make the following change in your medication:  ? ?1) START Verapamil 40 mg: ?- take 1 tablet by mouth every 4 hours up to 3 doses as needed for recurrent tachycardia ? ?*If you need a refill on your cardiac medications before your next appointment, please call your pharmacy* ? ? ?Lab Work: ?- none ordered ? ?If you have labs (blood work) drawn today and your tests are completely normal, you will receive your results only by: ?MyChart Message (if you have MyChart) OR ?A paper copy in the mail ?If you have any lab test that is abnormal or we need to change your treatment, we will call you to review the results. ? ? ?Testing/Procedures: ?- none ordered ? ? ?Follow-Up: ?At [facility], you and your health needs are our priority.  As part of our continuing mission to provide you with exceptional heart care, we have created designated Provider Care Teams.  These Care Teams include your primary Cardiologist (physician) and Advanced Practice Providers (APPs -  Physician Assistants and Nurse Practitioners) who all work together to provide you with the care you need, when you need it. ? ?We recommend signing up for the patient portal called "MyChart".  Sign up information is provided on this After Visit Summary.  MyChart is used to connect with patients for Virtual Visits (Telemedicine).  Patients are able to view lab/test results, encounter notes, upcoming appointments, etc.  Non-urgent messages can be sent to your provider as well.   ?To learn more about what you can do with MyChart, go to [email or web address].   ? ?Your next appointment:   ?As scheduled  ? ?The format for your next appointment:   ?In Person ? ?Provider:   ?Sherryl Manges, MD  ? ? ?Other Instructions ?Verapamil Tablets ?What is this medication? ?VERAPAMIL (ver AP a mil) treats high blood pressure and prevents chest pain (angina). It may also be used to treat a fast or irregular heartbeat  (arrhythmia). It works by relaxing the blood vessels, which helps decrease the amount of work your heart has to do. It belongs to a group of medications called calcium channel blockers. ?This medicine may be used for other purposes; ask your health care provider or pharmacist if you have questions. ?COMMON BRAND NAME(S): Calan ?What should I tell my care team before I take this medication? ?They need to know if you have any of these conditions: ?Duchenne muscular dystrophy ?Heart disease ?Irregular heartbeat or rhythm ?Liver disease ?Low blood pressure ?An unusual or allergic reaction to verapamil, other medications, foods, dyes or preservatives ?Pregnant or trying to get pregnant ?Breast-feeding ?How should I use this medication? ?Take this medication by mouth. Take it as directed on the prescription label at the same time every day. You can take it with or without food. If it upsets your stomach, take it with food. Keep taking it unless your care team tells you to stop. ?Do not take this medication with grapefruit juice. ?Talk to your care team about the use of this medication in children. Special care may be needed. ?Overdosage: If you think you have taken too much of this medicine contact a poison control center or emergency room at once. ?NOTE: This medicine is only for you. Do not share this medicine with others. ?What if I miss a dose? ?If you miss a dose, take it as soon as you can. If it is almost time for your next dose, take only that  dose. Do not take double or extra doses. ?What may interact with this medication? ?Do not take this medication with any of the following: ?Cisapride ?Disopyramide ?Dofetilide ?Grapefruit juice ?[place]orn ?Pimozide ?Red yeast rice ?This medication may also interact with the following: ?Barbiturates such as phenobarbital ?Cimetidine ?Cyclosporine ?Lithium ?Local anesthetics or general anesthetics ?Medications for heart rhythm problems like amiodarone, digoxin, flecainide,  procainamide, quinidine ?Medications for high blood pressure or heart problems ?Medications for seizures like carbamazepine and phenytoin ?Rifampin, rifabutin or rifapentine ?Theophylline or aminophylline ?This list may not describe all possible interactions. Give your health care provider a list of all the medicines, herbs, non-prescription drugs, or dietary supplements you use. Also tell them if you smoke, drink alcohol, or use illegal drugs. Some items may interact with your medicine. ?What should I watch for while using this medication? ?Visit your care team for regular checks on your progress. Check your blood pressure as directed. Ask your care team what your blood pressure should be. Also, find out when you should contact them. ?Do not treat yourself for coughs, colds, or pain while you are using this medication without asking your care team for advice. Some medications may increase your blood pressure. ?You may get drowsy or dizzy. Do not drive, use machinery, or do anything that needs mental alertness until you know how this medication affects you. Do not stand up or sit up quickly, especially if you are an older patient. This reduces the risk of dizzy or fainting spells. ?What side effects may I notice from receiving this medication? ?Side effects that you should report to your care team as soon as possible: ?Allergic reactions--skin rash, itching, hives, swelling of the face, lips, tongue, or throat ?Heart failure--shortness of breath, swelling of the ankles, feet, or hands, sudden weight gain, unusual weakness or fatigue ?Slow heartbeat--dizziness, feeling faint or lightheaded, trouble breathing, unusual weakness or fatigue ?Liver injury--right upper belly pain, loss of appetite, nausea, light-colored stool, dark yellow or brown urine, yellowing skin or eyes, unusual weakness or fatigue ?Low blood pressure--dizziness, feeling faint or lightheaded, blurry vision ?Side effects that usually do not require  medical attention (report to your care team if they continue or are bothersome): ?Constipation ?Dizziness ?Headache ?Nausea ?This list may not describe all possible side effects. Call your doctor for medical advice about side effects. You may report side effects to FDA at 1-800-FDA-1088. ?Where should I keep my medication? ?Keep out of the reach of children and pets. ?Store at room temperature between 20 and 25 degrees C (68 and 77 degrees F). Protect from light. Throw away any unused medication after the expiration date. ?NOTE: This sheet is a summary. It may not cover all possible information. If you have questions about this medicine, talk to your doctor, pharmacist, or health care provider. ?? 2023 Elsevier/Gold Standard (2021-07-02 00:00:00) ? ? ?Important Information About Sugar ? ? ? ? ? ? ?

## 2021-12-08 ENCOUNTER — Other Ambulatory Visit: Payer: Self-pay | Admitting: Nurse Practitioner

## 2021-12-08 DIAGNOSIS — I480 Paroxysmal atrial fibrillation: Secondary | ICD-10-CM

## 2021-12-09 DIAGNOSIS — H903 Sensorineural hearing loss, bilateral: Secondary | ICD-10-CM | POA: Diagnosis not present

## 2021-12-09 NOTE — Telephone Encounter (Signed)
Prescription refill request for Eliquis received. ?Indication: Atrial Fib ?Last office visit: 12/02/21  Odessa Fleming MD ?Scr: 1.07 on 11/26/21 ?Age: 88 ?Weight: 69.9kg ? ?Based on above findings Eliquis 5mg  twice daily is the appropriate dose.  Refill approved. ? ?

## 2021-12-09 NOTE — Telephone Encounter (Signed)
Please review

## 2021-12-13 ENCOUNTER — Other Ambulatory Visit: Payer: Self-pay | Admitting: Internal Medicine

## 2021-12-27 ENCOUNTER — Telehealth: Payer: Self-pay | Admitting: Internal Medicine

## 2021-12-27 NOTE — Telephone Encounter (Signed)
STAT if HR is under 50 or over 120 ?(normal HR is 60-100 beats per minute) ? ?What is your heart rate? 126 ? ?Do you have a log of your heart rate readings (document readings)?  ? ?Do you have any other symptoms? Weak, heart fluttering ? ?

## 2021-12-27 NOTE — Telephone Encounter (Signed)
Patient returned call.  Her BP was 129/70 HR 122 this was 2hrs after taking verapamil. ?

## 2021-12-27 NOTE — Telephone Encounter (Signed)
Spoke with patient and reviewed her readings. She has taken 2 doses today of verapamil and instructed her to take another tonight. She has appointment for tomorrow and recommended if heart rates are still elevated prior to her appointment to take appropriate doses until she is seen at her appointment. She verbalized understanding with no further questions at this time.  ?

## 2021-12-27 NOTE — Telephone Encounter (Signed)
Patient c/o Palpitations:  High priority if patient c/o lightheadedness, shortness of breath, or chest pain ? ?How long have you had palpitations/irregular HR/ Afib? Are you having the symptoms now? Recent admission for afib .  Currently weakness and fluttering  ? ?Are you currently experiencing lightheadedness, SOB or CP? Weakness shakiness ? ?Do you have a history of afib (atrial fibrillation) or irregular heart rhythm? yes ? ?Have you checked your BP or HR? (document readings if available): 120's/70's HR bouncing around  ? ?Are you experiencing any other symptoms? Patient has been taking verapamil for episode and wants advice  ? ?

## 2021-12-27 NOTE — Telephone Encounter (Signed)
Spoke with patient and she reports blood pressure this morning at 09:45 am was 125/75 with HR of 125. She took 1 dose of Verapamil 40 mg at 08:44 am. ? ?This started Saturday at 3 am and she took a total of 2 verapamil that day. Her Blood pressure on Saturday was down to 100/63 with HR 76.  ? ?On Sunday she took 3 total verapamil that day.  ? ?Had her check blood pressure and heart rate while on the phone and it was 143/82 with heart rate of 128.  ? ?Scheduled her to come in tomorrow to see APP and instructed her to take verapamil for 3 doses today and we will help develop plan tomorrow. She verbalized understanding, agreeable with plan, and had no further questions at this time.  ?

## 2021-12-28 ENCOUNTER — Encounter: Payer: Self-pay | Admitting: Internal Medicine

## 2021-12-28 ENCOUNTER — Emergency Department: Payer: Medicare PPO

## 2021-12-28 ENCOUNTER — Ambulatory Visit: Payer: Medicare PPO | Admitting: Nurse Practitioner

## 2021-12-28 ENCOUNTER — Other Ambulatory Visit: Payer: Self-pay

## 2021-12-28 ENCOUNTER — Encounter: Payer: Self-pay | Admitting: Nurse Practitioner

## 2021-12-28 ENCOUNTER — Observation Stay
Admission: EM | Admit: 2021-12-28 | Discharge: 2021-12-30 | Disposition: A | Discharge status: Home / Self Care | Payer: Medicare PPO | Attending: Internal Medicine | Admitting: Internal Medicine

## 2021-12-28 VITALS — BP 110/60 | HR 121 | Ht 64.0 in | Wt 154.0 lb

## 2021-12-28 DIAGNOSIS — I10 Essential (primary) hypertension: Secondary | ICD-10-CM

## 2021-12-28 DIAGNOSIS — I110 Hypertensive heart disease with heart failure: Secondary | ICD-10-CM | POA: Insufficient documentation

## 2021-12-28 DIAGNOSIS — Z20822 Contact with and (suspected) exposure to covid-19: Secondary | ICD-10-CM | POA: Diagnosis not present

## 2021-12-28 DIAGNOSIS — I4892 Unspecified atrial flutter: Secondary | ICD-10-CM

## 2021-12-28 DIAGNOSIS — I480 Paroxysmal atrial fibrillation: Secondary | ICD-10-CM | POA: Diagnosis present

## 2021-12-28 DIAGNOSIS — I5032 Chronic diastolic (congestive) heart failure: Secondary | ICD-10-CM | POA: Insufficient documentation

## 2021-12-28 DIAGNOSIS — E782 Mixed hyperlipidemia: Secondary | ICD-10-CM | POA: Diagnosis not present

## 2021-12-28 DIAGNOSIS — E876 Hypokalemia: Secondary | ICD-10-CM | POA: Insufficient documentation

## 2021-12-28 DIAGNOSIS — F419 Anxiety disorder, unspecified: Secondary | ICD-10-CM | POA: Diagnosis present

## 2021-12-28 DIAGNOSIS — I493 Ventricular premature depolarization: Secondary | ICD-10-CM

## 2021-12-28 LAB — CBC
HCT: 39.1 % (ref 36.0–46.0)
Hemoglobin: 13.1 g/dL (ref 12.0–15.0)
MCH: 30.3 pg (ref 26.0–34.0)
MCHC: 33.5 g/dL (ref 30.0–36.0)
MCV: 90.3 fL (ref 80.0–100.0)
Platelets: 262 10*3/uL (ref 150–400)
RBC: 4.33 MIL/uL (ref 3.87–5.11)
RDW: 13.6 % (ref 11.5–15.5)
WBC: 10 10*3/uL (ref 4.0–10.5)
nRBC: 0 % (ref 0.0–0.2)

## 2021-12-28 LAB — PROTIME-INR
INR: 1.3 — ABNORMAL HIGH (ref 0.8–1.2)
Prothrombin Time: 15.7 seconds — ABNORMAL HIGH (ref 11.4–15.2)

## 2021-12-28 LAB — BASIC METABOLIC PANEL
Anion gap: 13 (ref 5–15)
BUN: 21 mg/dL (ref 8–23)
CO2: 20 mmol/L — ABNORMAL LOW (ref 22–32)
Calcium: 10.2 mg/dL (ref 8.9–10.3)
Chloride: 101 mmol/L (ref 98–111)
Creatinine, Ser: 0.8 mg/dL (ref 0.44–1.00)
GFR, Estimated: >60 mL/min (ref >60)
Glucose, Bld: 110 mg/dL — ABNORMAL HIGH (ref 70–99)
Potassium: 3.4 mmol/L — ABNORMAL LOW (ref 3.5–5.1)
Sodium: 134 mmol/L — ABNORMAL LOW (ref 135–145)

## 2021-12-28 LAB — MAGNESIUM: Magnesium: 2.1 mg/dL (ref 1.7–2.4)

## 2021-12-28 LAB — BRAIN NATRIURETIC PEPTIDE: B Natriuretic Peptide: 111.7 pg/mL — ABNORMAL HIGH (ref 0.0–100.0)

## 2021-12-28 LAB — T4, FREE: Free T4: 1.45 ng/dL — ABNORMAL HIGH (ref 0.61–1.12)

## 2021-12-28 LAB — RESP PANEL BY RT-PCR (FLU A&B, COVID) ARPGX2
Influenza A by PCR: NEGATIVE
Influenza B by PCR: NEGATIVE
SARS Coronavirus 2 by RT PCR: NEGATIVE

## 2021-12-28 LAB — TROPONIN I (HIGH SENSITIVITY)
Troponin I (High Sensitivity): 11 ng/L (ref <18)
Troponin I (High Sensitivity): 11 ng/L (ref <18)

## 2021-12-28 LAB — TSH: TSH: 0.639 u[IU]/mL (ref 0.350–4.500)

## 2021-12-28 LAB — APTT: aPTT: 31 seconds (ref 24–36)

## 2021-12-28 MED ORDER — DILTIAZEM LOAD VIA INFUSION
10.0000 mg | Freq: Once | INTRAVENOUS | Status: AC
Start: 2021-12-28 — End: 2021-12-28
  Administered 2021-12-28: 10 mg via INTRAVENOUS
  Filled 2021-12-28: qty 10

## 2021-12-28 MED ORDER — ONDANSETRON HCL 4 MG/2ML IJ SOLN: 4.0000 mg | Freq: Four times a day (QID) | INTRAMUSCULAR | Status: DC | PRN

## 2021-12-28 MED ORDER — ONDANSETRON HCL 4 MG PO TABS: 4.0000 mg | ORAL_TABLET | Freq: Four times a day (QID) | ORAL | Status: DC | PRN

## 2021-12-28 MED ORDER — DILTIAZEM HCL-DEXTROSE 125-5 MG/125ML-% IV SOLN (PREMIX)
5.0000 mg/h | INTRAVENOUS | Status: DC
  Administered 2021-12-28: 5 mg/h via INTRAVENOUS
  Administered 2021-12-29: 10 mg/h via INTRAVENOUS
  Filled 2021-12-28: qty 125

## 2021-12-28 MED ORDER — APIXABAN 5 MG PO TABS
5.0000 mg | ORAL_TABLET | Freq: Two times a day (BID) | ORAL | Status: DC
  Administered 2021-12-28 – 2021-12-30 (×4): 5 mg via ORAL
  Filled 2021-12-28 (×4): qty 1

## 2021-12-28 MED ORDER — SIMVASTATIN 20 MG PO TABS
20.0000 mg | ORAL_TABLET | Freq: Once | ORAL | Status: AC
  Administered 2021-12-28: 20 mg via ORAL
  Filled 2021-12-28: qty 1

## 2021-12-28 MED ORDER — AMITRIPTYLINE HCL 25 MG PO TABS
25.0000 mg | ORAL_TABLET | Freq: Every day | ORAL | Status: DC
  Administered 2021-12-28 – 2021-12-29 (×2): 25 mg via ORAL
  Filled 2021-12-28 (×2): qty 1

## 2021-12-28 MED ORDER — MELATONIN 5 MG PO TABS
5.0000 mg | ORAL_TABLET | Freq: Every evening | ORAL | Status: DC | PRN
  Administered 2021-12-29: 5 mg via ORAL
  Filled 2021-12-28: qty 1

## 2021-12-28 MED ORDER — POTASSIUM CHLORIDE CRYS ER 20 MEQ PO TBCR
40.0000 meq | EXTENDED_RELEASE_TABLET | Freq: Once | ORAL | Status: AC
  Administered 2021-12-28: 40 meq via ORAL
  Filled 2021-12-28: qty 2

## 2021-12-28 MED ORDER — LORAZEPAM 0.5 MG PO TABS: 0.5000 mg | ORAL_TABLET | Freq: Two times a day (BID) | ORAL | Status: DC | PRN

## 2021-12-28 MED ORDER — FUROSEMIDE 20 MG PO TABS
20.0000 mg | ORAL_TABLET | Freq: Every day | ORAL | Status: DC
  Administered 2021-12-29 – 2021-12-30 (×2): 20 mg via ORAL
  Filled 2021-12-28 (×2): qty 1

## 2021-12-28 MED ORDER — ACETAMINOPHEN 325 MG PO TABS
650.0000 mg | ORAL_TABLET | Freq: Four times a day (QID) | ORAL | Status: DC | PRN
  Administered 2021-12-29 (×2): 650 mg via ORAL
  Filled 2021-12-28 (×2): qty 2

## 2021-12-28 MED ORDER — POLYETHYLENE GLYCOL 3350 17 G PO PACK: 17.0000 g | PACK | Freq: Every day | ORAL | Status: DC | PRN

## 2021-12-28 MED ORDER — ONDANSETRON HCL 4 MG/2ML IJ SOLN
4.0000 mg | Freq: Once | INTRAMUSCULAR | Status: AC
  Administered 2021-12-28: 4 mg via INTRAVENOUS
  Filled 2021-12-28: qty 2

## 2021-12-28 MED ORDER — ACETAMINOPHEN 650 MG RE SUPP: 650.0000 mg | Freq: Four times a day (QID) | RECTAL | Status: DC | PRN

## 2021-12-28 NOTE — ED Provider Triage Note (Signed)
Emergency Medicine Provider Triage Evaluation Note ? ?Meagan Wilcox , a 88 y.o. female  was evaluated in triage.  Pt complains of atrial fibrillation, has a history of atrial fibrillation that has been well controlled with medication, on Eliquis.  Saturday heart rate in the 120s, had been feeling fine up until this morning.  Heart rate remains in the 120s to 130s, today patient with fatigue, weakness, nausea.  Some reports of shortness of breath.  No chest pain. ? ?Review of Systems  ?Positive: Weakness, fatigue, nausea, shortness of breath, elevated heart rate ?Negative: Chest pain ? ?Physical Exam  ?Ht 5\' 4"  (1.626 m)   Wt 69.9 kg   BMI 26.45 kg/m?  ?Gen:   Awake, no distress presents in a wheelchair ?Resp:  Normal effort  ?MSK:   Moves extremities without difficulty  ?Other:   ? ?Medical Decision Making  ?Medically screening exam initiated at 3:38 PM.  Appropriate orders placed.  Meagan Wilcox was informed that the remainder of the evaluation will be completed by another provider, this initial triage assessment does not replace that evaluation, and the importance of remaining in the ED until their evaluation is complete. ? ? ?  ?Meagan Mesi, PA-C ?12/28/21 1539 ? ?

## 2021-12-28 NOTE — Progress Notes (Signed)
? ? ?Office Visit  ?  ?Patient Name: Meagan Wilcox ?Date of Encounter: 12/28/2021 ? ?Primary Care Provider:  Kirk Ruths, MD ?Primary Cardiologist:  Meagan Bush, MD ? ?Chief Complaint  ?  ?88 year old female with a history of persistent atrial fibrillation/atypical atrial flutter, hypertension, hyperlipidemia, hypothyroidism, GERD, HFpEF, PSVT, and arthritis, who presents for follow-up related to atrial fibrillation. ? ?Past Medical History  ?  ?Past Medical History:  ?Diagnosis Date  ? (HFpEF) heart failure with preserved ejection fraction (Meagan Wilcox)   ? a. 01/2021 Echo: EF 55-60%, no rwma, GrII DD, nl RV fxn, mod elev PASP. Mild MR, mild-mod TR.  ? Abnormal mammogram, unspecified 2011  ? Arthritis 2002  ? Chronic pain syndrome 08/01/2017  ? GERD (gastroesophageal reflux disease)   ? Heart murmur 1985  ? Hyperlipidemia   ? Hypertension 2005  ? Hypothyroidism   ? Persistent atrial fibrillation/Atypical atrial flutter (Meagan Wilcox)   ? a. 04/2019 TEE/DCCV; b. 05/2019 Recurrent Aflutter-->Amio-->DCCV; c. CHA2DS2VASc = 4-->Eliquis; d. 08/2021 recurrent Afib req DCCV.  Flecainide started; e. 09/2021 Flecainide d/c'd 2/2 brady - couldn't tol bb.  ? PSVT (paroxysmal supraventricular tachycardia) (Meagan Wilcox)   ? a. 09/2021 Zio: Predominantly sinus rhythm (67; range 49-102).  Rare PACs with occasional PVCs (PVC burden 3.6%).  44 SVT runs-fastest 190, longest 12.9 seconds.  No sustained arrhythmias/prolonged pauses.  No significant arrhythmia identified corresponding to patient's symptoms.  ? ?Past Surgical History:  ?Procedure Laterality Date  ? APPENDECTOMY  1939  ? BREAST BIOPSY Left 02-24-14  ? benign  ? BREAST BIOPSY Left 02/11/2016  ? neg  ? BREAST BIOPSY Left 2013  ? neg  ? BREAST EXCISIONAL BIOPSY Left 2014  ? neg  ? BREAST SURGERY Left 2013  ? stereotactic biopsy   ? BREAST SURGERY Left May 2010  ? 1 mm foci of atypical ductal hyperplasia  ? CARDIOVERSION N/A 05/08/2019  ? Procedure: CARDIOVERSION;  Surgeon: Meagan Sable,  MD;  Location: [facility] ORS;  Service: Cardiovascular;  Laterality: N/A;  ? CARDIOVERSION N/A 05/31/2019  ? Procedure: CARDIOVERSION;  Surgeon: Meagan Merritts, MD;  Location: [facility];  Service: Cardiovascular;  Laterality: N/A;  ? CARDIOVERSION N/A 08/25/2021  ? Procedure: CARDIOVERSION;  Surgeon: Meagan Sable, MD;  Location: [facility] OR[facility];  Service: Cardiovascular;  Laterality: N/A;  ? cataract surgery  Bilateral 2009  ? COLONOSCOPY  2010  ? Meagan Wilcox  ? left breast biopsy   2009  ? TEE WITHOUT CARDIOVERSION N/A 05/08/2019  ? Procedure: TRANSESOPHAGEAL ECHOCARDIOGRAM (TEE);  Surgeon: Meagan Sable, MD;  Location: [facility] ORS;  Service: Cardiovascular;  Laterality: N/A;  ? ? ?Allergies ? ?Allergies  ?Allergen Reactions  ? Amoxicillin Diarrhea  ? Penicillins Rash  ?  Did it involve swelling of the face/tongue/throat, SOB, or low BP? No ?Did it involve sudden or severe rash/hives, skin peeling, or any reaction on the inside of your mouth or nose? No ?Did you need to seek medical attention at a hospital or doctor's office? Yes ?When did it last happen?      65 years ago ?If all above answers are ?NO?, may proceed with cephalosporin use.  ? ? ?History of Present Illness  ?  ?88 year old female with the above complex past medical history including persistent atrial fibrillation/flutter, hypertension, hyperlipidemia, HFpEF, hypothyroidism, GERD, and arthritis.  In September 2020, she was diagnosed with atrial fibrillation/atypical atrial flutter, and underwent TEE guided cardioversion.  She had recurrent tachypalpitations October 2020, and was found to be in atrial flutter with 2 1  conduction.  She was placed on amiodarone and underwent repeat cardioversion October 2020.  Amiodarone dose was reduced to 20 mg daily November 2020, in the setting of nausea and fatigue.  This was reduced further to 100 mg daily in the setting of nausea and fatigue in June 2021.  In May 2022, she experienced dyspnea and fatigue, as well as  bradycardia on home monitoring.  Formal event monitoring showed an average heart rate of 55 bpm with a 5.1% PVC burden, 10 brief atrial runs, and no evidence of recurrent A-fib/flutter.  Triggered events were associated sinus rhythm and PVCs, as well as junctional rhythm.  Echo at that time showed an EF of 55-60 % with grade 2 diastolic dysfunction.  Amiodarone was subsequently discontinued in July 2022 in the setting of frequent nausea and dizziness. ? ?Meagan Wilcox was admitted to [facility] in January 2023 in the setting of fatigue and palpitations with recurrent atrial flutter with rates in the 160s.  She was seen by EP and placed on flecainide 50 mg twice daily as well as nadolol.  TEE and cardioversion were carried out and she was noted to be bradycardic post cardioversion.  Nadolol was discontinued in favor of pindolol.  A follow-up in early February, she was bradycardic and beta-blocker and flecainide were discontinued.  Repeat Zio monitoring showed no atrial fibrillation or flutter.  She had 44 brief runs of atrial tachycardia.  She has since been seen by Dr. Caryl Wilcox.  At most recent visit on April 20, she reported an episode of recurrent tachycardia associated with lightheadedness, dyspnea, and chest discomfort lasting about 8 hours and resolving spontaneously.  It was felt that this likely represented atrial fibrillation and she was placed on verapamil 40 mg every 4 hours x 3 doses as needed for breakthrough tachycardia.  It was felt that she might require backup bradycardia pacing and resumption of AV nodal blocking agents versus monitoring to see whether Tikosyn therapy might be an appropriate antiarrhythmic for her. ? ?Unfortunately, beginning Saturday, May 13, Meagan Wilcox started noting irregular tachypalpitations.  She used several doses of verapamil on the 13th and rates improved into the 70s by night fall.  She had recurrent elevations in the 90s and low 100s throughout Sunday and Monday, prompting  her to take additional verapamil.  Associated with palpitations, she has been fatigued.  Today, rates have been in the 120s and higher.  Following lunch today, she started experiencing significant nausea, fatigue, and weakness, just prior to presenting for her appointment.  She is in atrial flutter at 121.  Her niece is present with her today.  We agreed that she would be best served by transfer to the ED for initiation of diltiazem infusion and cardioversion tomorrow if necessary.  She has not missed any doses of Eliquis. ? ?Home Medications  ?  ?Current Outpatient Medications  ?Medication Sig Dispense Refill  ? acetaminophen (TYLENOL) 325 MG tablet Take 325 mg by mouth every 6 (six) hours as needed for mild pain, fever, headache or moderate pain.    ? ALPRAZolam (XANAX) 0.25 MG tablet Take 0.25 mg by mouth daily as needed for anxiety. (Patient not taking: Reported on 12/02/2021)    ? amitriptyline (ELAVIL) 25 MG tablet TAKE 1 TABLET BY MOUTH DAILY (Patient taking differently: Take 25 mg by mouth at bedtime.) 90 tablet 3  ? BIOTIN PO Take 1 tablet by mouth daily.    ? Camphor-Menthol-Methyl Sal (SALONPAS) 3.08-20-08 % PTCH Place 1 patch onto the skin  daily as needed (back pain).    ? cholestyramine (QUESTRAN) 4 g packet Take 1 packet by mouth 2 (two) times daily. (Patient not taking: Reported on 12/02/2021)    ? ELIQUIS 5 MG TABS tablet TAKE 1 TABLET(5 MG) BY MOUTH TWICE DAILY 180 tablet 1  ? ELMIRON 100 MG capsule TAKE 1 CAPSULE BY MOUTH THREE TIMES DAILY (Patient taking differently: 100 mg 2 (two) times daily.) 90 capsule 3  ? furosemide (LASIX) 20 MG tablet Take 1 tablet (20 mg total) by mouth daily. 30 tablet   ? levothyroxine (SYNTHROID) 125 MCG tablet TAKE 1 TABLET(125 MCG) BY MOUTH DAILY 90 tablet 3  ? LORazepam (ATIVAN) 0.5 MG tablet Take 1 tablet by mouth every 12 (twelve) hours as needed for anxiety.    ? losartan-hydrochlorothiazide (HYZAAR) 100-12.5 MG tablet Take 1 tablet by mouth daily. 90 tablet 3  ?  melatonin 5 MG TABS Take 1 tablet (5 mg total) by mouth at bedtime. (Patient taking differently: Take 5 mg by mouth at bedtime as needed (Sleep).)  0  ? Multiple Vitamin (MULTIVITAMIN) capsule Take 1 ca

## 2021-12-28 NOTE — H&P (Addendum)
History and Physical:    Meagan Wilcox   ZOX:[id number] DOB: 14-Aug-1931 DOA: 12/28/2021  Referring MD/provider: Antoine Primas, MD PCP: Lauro Regulus, MD   Patient coming from: Home  Chief Complaint: Shortness of breath and A-fib  History of Present Illness:   Meagan Wilcox is a 88 y.o. female with medical history significant for paroxysmal atrial fibrillation/atrial flutter (s/p cardioversion x3), paroxysmal SVT, bradycardia, hypertension, hyperlipidemia, hypothyroidism, GERD, chronic diastolic CHF, arthritis.  She was recently seen by Dr. Graciela Husbands, electrophysiologist, for palpitations/tachycardia she was prescribed verapamil to be taken up to 3 times a day as needed for palpitations/tachycardia.  She complained of palpitations, shortness of breath, dizziness and nausea.  Her symptoms started about 3 days prior to admission.  She took verapamil as instructed and her symptoms improved.  Her symptoms recurred but she did not see any improvement after taking verapamil. She went to the cardiology office for follow-up.  She felt very ill with palpitations, shortness of breath dizziness and nausea.  She vomited once at the doctor's office.  She was referred to the emergency room for further management.  No cough, fever, chills, chest pain, leg swelling, diarrhea or abdominal pain.  ED Course:  The patient was in rapid atrial flutter when she got to the ED.  She was started on IV Cardizem.  ROS:   ROS all other systems reviewed were negative  Past Medical History:   Past Medical History:  Diagnosis Date   (HFpEF) heart failure with preserved ejection fraction (HCC)    a. 01/2021 Echo: EF 55-60%, no rwma, GrII DD, nl RV fxn, mod elev PASP. Mild MR, mild-mod TR.   Abnormal mammogram, unspecified 2011   Arthritis 2002   Chronic pain syndrome 08/01/2017   GERD (gastroesophageal reflux disease)    Heart murmur 1985   Hyperlipidemia    Hypertension 2005   Hypothyroidism    Persistent  atrial fibrillation/Atypical atrial flutter (HCC)    a. 04/2019 TEE/DCCV; b. 05/2019 Recurrent Aflutter-->Amio-->DCCV; c. CHA2DS2VASc = 4-->Eliquis; d. 08/2021 recurrent Afib req DCCV.  Flecainide started; e. 09/2021 Flecainide d/c'd 2/2 brady - couldn't tol bb.   PSVT (paroxysmal supraventricular tachycardia) (HCC)    a. 09/2021 Zio: Predominantly sinus rhythm (67; range 49-102).  Rare PACs with occasional PVCs (PVC burden 3.6%).  44 SVT runs-fastest 190, longest 12.9 seconds.  No sustained arrhythmias/prolonged pauses.  No significant arrhythmia identified corresponding to patient's symptoms.    Past Surgical History:   Past Surgical History:  Procedure Laterality Date   APPENDECTOMY  1939   BREAST BIOPSY Left 02-24-14   benign   BREAST BIOPSY Left 02/11/2016   neg   BREAST BIOPSY Left 2013   neg   BREAST EXCISIONAL BIOPSY Left 2014   neg   BREAST SURGERY Left 2013   stereotactic biopsy    BREAST SURGERY Left May 2010   1 mm foci of atypical ductal hyperplasia   CARDIOVERSION N/A 05/08/2019   Procedure: CARDIOVERSION;  Surgeon: Debbe Odea, MD;  Location: [facility] ORS;  Service: Cardiovascular;  Laterality: N/A;   CARDIOVERSION N/A 05/31/2019   Procedure: CARDIOVERSION;  Surgeon: Antonieta Iba, MD;  Location: [facility] ORS;  Service: Cardiovascular;  Laterality: N/A;   CARDIOVERSION N/A 08/25/2021   Procedure: CARDIOVERSION;  Surgeon: Debbe Odea, MD;  Location: [facility] ORS;  Service: Cardiovascular;  Laterality: N/A;   cataract surgery  Bilateral 2009   COLONOSCOPY  2010   Dr. Mechele Collin   left breast biopsy   2009  TEE WITHOUT CARDIOVERSION N/A 05/08/2019   Procedure: TRANSESOPHAGEAL ECHOCARDIOGRAM (TEE);  Surgeon: Debbe Odea, MD;  Location: [facility] ORS;  Service: Cardiovascular;  Laterality: N/A;    Social History:   Social History   Socioeconomic History   Marital status: Single    Spouse name: Not on file   Number of children: Not on file   Years of education: Not  on file   Highest education level: Not on file  Occupational History   Not on file  Tobacco Use   Smoking status: Never   Smokeless tobacco: Never  Vaping Use   Vaping Use: Never used  Substance and Sexual Activity   Alcohol use: No    Alcohol/week: 0.0 standard drinks   Drug use: No   Sexual activity: Not on file  Other Topics Concern   Not on file  Social History Narrative   Not on file   Social Determinants of Health   Financial Resource Strain: Not on file  Food Insecurity: Not on file  Transportation Needs: Not on file  Physical Activity: Not on file  Stress: Not on file  Social Connections: Not on file  Intimate Partner Violence: Not on file    Allergies   Amoxicillin and Penicillins  Family history:   Family History  Problem Relation Age of Onset   Heart disease Mother    Heart disease Father    Diabetes Father    Stroke Father    Arthritis Father     Current Medications:   Prior to Admission medications   Medication Sig Start Date End Date Taking? Authorizing Provider  acetaminophen (TYLENOL) 325 MG tablet Take 325 mg by mouth every 6 (six) hours as needed for mild pain, fever, headache or moderate pain.    [provider]  amitriptyline (ELAVIL) 25 MG tablet TAKE 1 TABLET BY MOUTH DAILY 12/22/20   Corky Downs, MD  BIOTIN PO Take 1 tablet by mouth daily.    [provider]  Camphor-Menthol-Methyl Sal (SALONPAS) 3.08-20-08 % PTCH Place 1 patch onto the skin daily as needed (back pain).    [provider]  cholestyramine (QUESTRAN) 4 g packet Take 1 packet by mouth 2 (two) times daily. 10/21/21   [provider]  ELIQUIS 5 MG TABS tablet TAKE 1 TABLET(5 MG) BY MOUTH TWICE DAILY 12/09/21   End, Cristal Deer, MD  ELMIRON 100 MG capsule TAKE 1 CAPSULE BY MOUTH THREE TIMES DAILY 10/21/21   Corky Downs, MD  furosemide (LASIX) 20 MG tablet Take 1 tablet (20 mg total) by mouth daily. 08/31/21   Tresa Moore, MD   levothyroxine (SYNTHROID) 125 MCG tablet TAKE 1 TABLET(125 MCG) BY MOUTH DAILY 06/27/21   Corky Downs, MD  LORazepam (ATIVAN) 0.5 MG tablet Take 1 tablet by mouth every 12 (twelve) hours as needed for anxiety. 09/22/21   [provider]  losartan-hydrochlorothiazide (HYZAAR) 100-12.5 MG tablet Take 1 tablet by mouth daily. 10/27/21   Creig Hines, NP  melatonin 5 MG TABS Take 1 tablet (5 mg total) by mouth at bedtime. 08/31/21   Tresa Moore, MD  Multiple Vitamin (MULTIVITAMIN) capsule Take 1 capsule by mouth daily.    [provider]  naproxen sodium (ALEVE) 220 MG tablet Take 220 mg by mouth daily as needed.    [provider]  omeprazole (PRILOSEC) 20 MG capsule TAKE 1 CAPSULE BY MOUTH DAILY 03/01/21   Corky Downs, MD  psyllium (METAMUCIL SMOOTH TEXTURE) 28 % packet Take 1 packet by mouth  2 (two) times daily as needed (Constipation).    [provider]  simvastatin (ZOCOR) 20 MG tablet TAKE 1 TABLET BY MOUTH DAILY 12/14/21   Corky Downs, MD  verapamil (CALAN) 40 MG tablet Take 1 tablet (40 mg) by mouth every 4 hours up to 3 doses as needed for recurrent tachycardia 12/02/21   Duke Salvia, MD    Physical Exam:   Vitals:   12/28/21 1830 12/28/21 1930 12/28/21 2005 12/28/21 2138  BP: (!) 166/98 (!) 159/91 (!) 146/74 125/63  Pulse: (!) 113 (!) 111 (!) 118 (!) 120  Resp: (!) 25 18 18    Temp:  98.5 F (36.9 C) 97.6 F (36.4 C)   TempSrc:  Oral Oral   SpO2: 95% 98% 98%   Weight:   69.2 kg   Height:   5\' 3"  (1.6 m)      Physical Exam: Blood pressure 125/63, pulse (!) 120, temperature 97.6 F (36.4 C), temperature source Oral, resp. rate 18, height 5\' 3"  (1.6 m), weight 69.2 kg, SpO2 98 %. Gen: No acute distress. Head: Normocephalic, atraumatic. Eyes: Pupils equal, round and reactive to light. Extraocular movements intact.  Sclerae nonicteric.  Mouth: Moist mucous membranes Neck: Supple, no jugular venous distention. Chest:  Lungs are clear to auscultation with good air movement. No rales, rhonchi or wheezes.  CV: Irregular rate and rhythm, tachycardic. No murmurs, rubs or gallops.  Abdomen: Soft, nontender, nondistended with normal active bowel sounds. No palpable masses. Extremities: Extremities are without clubbing, or cyanosis. No edema. Pedal pulses 2+.  Skin: Warm and dry.  Erythematous patchy lesion on mid back.  No lesions or wounds Neuro: Alert and oriented times 3; grossly nonfocal.  Psych: Insight is good and judgment is appropriate. Mood and affect normal.   Data Review:    Labs: Basic Metabolic Panel: Recent Labs  Lab 12/28/21 1542 12/28/21 1756  NA 134*  --   K 3.4*  --   CL 101  --   CO2 20*  --   GLUCOSE 110*  --   BUN 21  --   CREATININE 0.80  --   CALCIUM 10.2  --   MG  --  2.1   Liver Function Tests: No results for input(s): AST, ALT, ALKPHOS, BILITOT, PROT, ALBUMIN in the last 168 hours. No results for input(s): LIPASE, AMYLASE in the last 168 hours. No results for input(s): AMMONIA in the last 168 hours. CBC: Recent Labs  Lab 12/28/21 1542  WBC 10.0  HGB 13.1  HCT 39.1  MCV 90.3  PLT 262   Cardiac Enzymes: No results for input(s): CKTOTAL, CKMB, CKMBINDEX, TROPONINI in the last 168 hours.  BNP (last 3 results) No results for input(s): PROBNP in the last 8760 hours. CBG: No results for input(s): GLUCAP in the last 168 hours.  Urinalysis    Component Value Date/Time   COLORURINE COLORLESS (A) 05/07/2019 1059   APPEARANCEUR CLEAR (A) 05/07/2019 1059   LABSPEC 1.003 (L) 05/07/2019 1059   PHURINE 5.0 05/07/2019 1059   GLUCOSEU NEGATIVE 05/07/2019 1059   HGBUR NEGATIVE 05/07/2019 1059   BILIRUBINUR NEGATIVE 05/07/2019 1059   KETONESUR NEGATIVE 05/07/2019 1059   PROTEINUR NEGATIVE 05/07/2019 1059   NITRITE NEGATIVE 05/07/2019 1059   LEUKOCYTESUR NEGATIVE 05/07/2019 1059      Radiographic Studies: DG Chest Port 1 View  Result Date: 12/28/2021 CLINICAL  DATA:  Pt here with a fib RVR with a rate in the 120s. Pt's family states it started on Sat. Pt  took her medication and it would not bring it down. Pt hx heart murmur, heart failure, Afib, HTN. Nonsmoker EXAM: PORTABLE CHEST - 1 VIEW COMPARISON:  08/23/2021 FINDINGS: Lungs are clear. Heart size and mediastinal contours are within normal limits. Aortic Atherosclerosis (ICD10-170.0). No effusion. Visualized bones unremarkable. IMPRESSION: No acute cardiopulmonary disease. Electronically Signed   By: Corlis Leak M.D.   On: 12/28/2021 16:03    EKG: Independently reviewed by me showed atrial flutter with RVR   Assessment/Plan:   Principal Problem:   Atrial flutter with rapid ventricular response (HCC) Active Problems:   Anxiety   Chronic heart failure with preserved ejection fraction (HFpEF) (HCC)   Paroxysmal atrial fibrillation with rapid ventricular response (HCC)  Paroxysmal atrial flutter/atrial fibrillation with RVR: Admit to progressive cardiac unit.  Treat with IV Cardizem bolus followed by IV Cardizem infusion.  Continue Eliquis for stroke prophylaxis.  Dr. Okey Dupre, cardiologist, was consulted by Dr. Katrinka Blazing, ED physician.  Hypokalemia: Replete potassium and monitor levels.  Chronic diastolic CHF: Compensated.  Continue Lasix  Anxiety: Continue Ativan as needed   Other information:   DVT prophylaxis:   Eliquis apixaban (ELIQUIS) tablet 5 mg  Code Status: Full code. Family Communication: Plan discussed with [name], niece, at the bedside Disposition Plan: Plan to discharge home in 2 to 3 days Consults called: Cardiologist Admission status: Inpatient  The medical decision making on this patient was of high complexity and the patient is at high risk for clinical deterioration, therefore this is a level 3 visit.     Kinneth Fujiwara [facility] Pager: Please check [email or web address]   How to contact the [facility] Attending or Consulting provider 7A - 7P or covering provider during after  hours 7P -7A, for this patient?   Check the care team in [facility] and look for a) attending/consulting [facility] provider listed and b) the [facility] team listed Log into [email or web address] and use [place]'s universal password to access. If you do not have the password, please contact the hospital operator. Locate the [facility] provider you are looking for under [facility] and page to a number that you can be directly reached. If you still have difficulty reaching the provider, please page the [facility] (Director on Call) for the Hospitalists listed on amion for assistance.  12/28/2021, 10:12 PM

## 2021-12-28 NOTE — ED Notes (Signed)
RN sent secure message to inpatient receiving RN, waiting response  ?

## 2021-12-28 NOTE — Progress Notes (Signed)
Called ED charge nurse Colletta Maryland and provided report on patient. She was then taken to ED for further evaluation and treatment.  ?

## 2021-12-28 NOTE — ED Notes (Signed)
Unable to access sunquest. Patient label sent to lab with patient resp panel.  ?

## 2021-12-28 NOTE — Patient Instructions (Signed)
Medication Instructions:  ? ?Your physician recommends that you continue on your current medications as directed. Please refer to the Current Medication list given to you today. ? ?*If you need a refill on your cardiac medications before your next appointment, please call your pharmacy* ? ? ?Lab Work: ? ?None Ordered ? ?If you have labs (blood work) drawn today and your tests are completely normal, you will receive your results only by: ?MyChart Message (if you have MyChart) OR ?A paper copy in the mail ?If you have any lab test that is abnormal or we need to change your treatment, we will call you to review the results. ? ? ?Testing/Procedures: ? ?None Ordered ? ? ?Follow-Up: ?At [facility], you and your health needs are our priority.  As part of our continuing mission to provide you with exceptional heart care, we have created designated Provider Care Teams.  These Care Teams include your primary Cardiologist (physician) and Advanced Practice Providers (APPs -  Physician Assistants and Nurse Practitioners) who all work together to provide you with the care you need, when you need it. ? ?We recommend signing up for the patient portal called "MyChart".  Sign up information is provided on this After Visit Summary.  MyChart is used to connect with patients for Virtual Visits (Telemedicine).  Patients are able to view lab/test results, encounter notes, upcoming appointments, etc.  Non-urgent messages can be sent to your provider as well.   ?To learn more about what you can do with MyChart, go to [email or web address].   ? ?Your next appointment:   ?2 week(s) ? ?The format for your next appointment:   ?In Person ? ?Provider:   ?You may see Nelva Bush, MD or one of the following Advanced Practice Providers on your designated Care Team:   ?Murray Hodgkins, NP ?Christell Faith, PA-C ?Cadence Kathlen Mody, PA-C ? ?Important Information About Sugar ? ? ? ? ? ? ?

## 2021-12-28 NOTE — ED Triage Notes (Signed)
Pt here with a fib RVR with a rate in the 120s. Pt's family states it started on Sat. Pt took her medication and it would not bring it down. ?

## 2021-12-28 NOTE — ED Provider Notes (Signed)
? ?[facility] ?Provider Note ? ? ? Event Date/Time  ? First MD Initiated Contact with Patient 12/28/21 1541   ?  (approximate) ? ? ?History  ? ?Tachycardia ? ? ?HPI ? ?Meagan Wilcox is a 88 y.o. female  with a history of persistent atrial fibrillation/atypical atrial flutter, HTN, HDL, hypothyroidism, GERD, HFpEF, PSVT, and arthritis who presents after being referred from cardiology clinic for evaluation of palpitations.  Patient states she is taking Eliquis and has been taking verapamil as directed by her cardiologist but feels that this has not helped her palpitations much.  She feels that they started with the most current episode on 5/13.  She states that she thinks it got a little better that day when she took some verapamil but that it has not been effective since her heart rates have been in the 120s to 130s.  He is also at a little shortness of breath with this as well as generalized weakness and some nausea.  She has not had any chest pain, abdominal pain, back pain, actual vomiting, diarrhea, burning with urination, headache earache, sore throat cough or fever.  No other medication changes.  No recent trauma or injuries. ? ?  ? ? ?Physical Exam  ?Triage Vital Signs: ?ED Triage Vitals  ?Enc Vitals Group  ?   BP 12/28/21 1539 (!) 145/80  ?   Pulse Rate 12/28/21 1539 (!) 116  ?   Resp 12/28/21 1539 18  ?   Temp 12/28/21 1539 98.2 ?F (36.8 ?C)  ?   Temp Source 12/28/21 1539 Oral  ?   SpO2 12/28/21 1539 95 %  ?   Weight 12/28/21 1534 154 lb 1.6 oz (69.9 kg)  ?   Height 12/28/21 1534 5\' 4"  (1.626 m)  ?   Head Circumference --   ?   Peak Flow --   ?   Pain Score 12/28/21 1534 0  ?   Pain Loc --   ?   Pain Edu? --   ?   Excl. in GC? --   ? ? ?Most recent vital signs: ?Vitals:  ? 12/28/21 1630 12/28/21 1700  ?BP: 137/79 134/77  ?Pulse: (!) 114 (!) 113  ?Resp: (!) 22 18  ?Temp:    ?SpO2: 98% 98%  ? ? ?General: Awake, no distress.  ?CV:  Good peripheral perfusion.  2+ radial pulses.   Tachycardic and regular. ?Resp:  Normal effort.  Clear. ?Abd:  No distention.  Soft. ?Other:  Minimal lower extremity edema ? ? ?ED Results / Procedures / Treatments  ?Labs ?(all labs ordered are listed, but only abnormal results are displayed) ?Labs Reviewed  ?BASIC METABOLIC PANEL - Abnormal; Notable for the following components:  ?    Result Value  ? Sodium 134 (*)   ? Potassium 3.4 (*)   ? CO2 20 (*)   ? Glucose, Bld 110 (*)   ? All other components within normal limits  ?PROTIME-INR - Abnormal; Notable for the following components:  ? Prothrombin Time 15.7 (*)   ? INR 1.3 (*)   ? All other components within normal limits  ?RESP PANEL BY RT-PCR (FLU A&B, COVID) ARPGX2  ?CBC  ?APTT  ?TSH  ?URINALYSIS, ROUTINE W REFLEX MICROSCOPIC  ?MAGNESIUM  ?T4, FREE  ?BRAIN NATRIURETIC PEPTIDE  ?TROPONIN I (HIGH SENSITIVITY)  ?TROPONIN I (HIGH SENSITIVITY)  ? ? ? ?EKG ? ?ECG is remarkable for a flutter with a ventricular rate of 116, multiple nonspecific change versus artifact in  inferior and lateral leads with otherwise unremarkable intervals.  Incomplete right bundle branch block.  Normal axis. ? ? ?RADIOLOGY ?Chest x-ray shows aortic knob calcifications on my interpretation as well as atherosclerosis.  I do not see overt edema, pneumothorax, focal consolidation, large effusion or other acute process.  I also reviewed radiology's interpretation. ? ?PROCEDURES: ? ?Critical Care performed: Yes, see critical care procedure note(s) ? ?.1-3 Lead EKG Interpretation ?Performed by: Gilles ChiquitoSmith, Odeth Bry P, MD ?Authorized by: Gilles ChiquitoSmith, Keyton Bhat P, MD  ? ?  Interpretation: non-specific   ?  ECG rate assessment: tachycardic   ?  Rhythm: atrial flutter   ?  Ectopy: aberrant   ?.Critical Care ?Performed by: Gilles ChiquitoSmith, Cyril Woodmansee P, MD ?Authorized by: Gilles ChiquitoSmith, Numa Schroeter P, MD  ? ?Critical care provider statement:  ?  Critical care time (minutes):  30 ?  Critical care was necessary to treat or prevent imminent or life-threatening deterioration of the following  conditions:  Cardiac failure ?  Critical care was time spent personally by me on the following activities:  Development of treatment plan with patient or surrogate, discussions with consultants, evaluation of patient's response to treatment, examination of patient, ordering and review of laboratory studies, ordering and review of radiographic studies, ordering and performing treatments and interventions, pulse oximetry, re-evaluation of patient's condition and review of old charts ? ?The patient is on the cardiac monitor to evaluate for evidence of arrhythmia and/or significant heart rate changes. ? ? ?MEDICATIONS ORDERED IN ED: ?Medications  ?diltiazem (CARDIZEM) 1 mg/mL load via infusion 10 mg (has no administration in time range)  ?  And  ?diltiazem (CARDIZEM) 125 mg in dextrose 5% 125 mL (1 mg/mL) infusion (has no administration in time range)  ?potassium chloride SA (KLOR-CON M) CR tablet 40 mEq (has no administration in time range)  ?ondansetron [facility](ZOFRAN) injection 4 mg (4 mg Intravenous Given 12/28/21 1548)  ? ? ? ?IMPRESSION / MDM / ASSESSMENT AND PLAN / ED COURSE  ?I reviewed the triage vital signs and the nursing notes. ?             ?               ? ?Differential diagnosis includes, but is not limited to a flutter/fib related to intrinsic cardiac disease, ischemia, anemia, metabolic derangements and endocrine derangements.  Other than nausea and slight shortness of breath patient has no infectious symptoms such as cough, fever, vomiting diarrhea or any pain. ? ?ECG is remarkable for a flutter with a ventricular rate of 116, multiple nonspecific change versus artifact in inferior and lateral leads with otherwise unremarkable intervals.  Incomplete right bundle branch block.  Normal axis. ? ?Chest x-ray shows aortic knob calcifications on my interpretation as well as atherosclerosis.  I do not see overt edema, pneumothorax, focal consolidation, large effusion or other acute process.  I also reviewed  radiology's interpretation. ? ?BMP is remarkable for K of 3.4.  This was repleted.  No other significant electrolyte or metabolic derangements.  CBC without leukocytosis or acute anemia.  Troponin is nonelevated and not suggestive of ACS.  COVID influenza PCR is negative.  TSH is WNL. ? ?I did discuss with admitting patient hospital service with on-call cardiologist Dr. Okey DupreEnd will add patient to consult list.  I will start patient on diltiazem drip.  I will admit to medicine service for further evaluation and management. ? ?  ? ? ?FINAL CLINICAL IMPRESSION(S) / ED DIAGNOSES  ? ?Final diagnoses:  ?Atrial flutter, unspecified type (HCC)  ?  Hypokalemia  ? ? ? ?Rx / DC Orders  ? ?ED Discharge Orders   ? ? None  ? ?  ? ? ? ?Note:  This document was prepared using Dragon voice recognition software and may include unintentional dictation errors. ?  ?Gilles Chiquito, MD ?12/28/21 1720 ? ?

## 2021-12-29 ENCOUNTER — Telehealth: Payer: Self-pay | Admitting: Internal Medicine

## 2021-12-29 ENCOUNTER — Inpatient Hospital Stay: Payer: Medicare PPO | Admitting: Anesthesiology

## 2021-12-29 ENCOUNTER — Other Ambulatory Visit: Payer: Self-pay

## 2021-12-29 ENCOUNTER — Encounter: Payer: Self-pay | Admitting: Cardiology

## 2021-12-29 ENCOUNTER — Encounter
Admission: EM | Disposition: A | Discharge status: Home / Self Care | Payer: Medicare PPO | Source: Home / Self Care | Attending: Emergency Medicine

## 2021-12-29 DIAGNOSIS — I4892 Unspecified atrial flutter: Secondary | ICD-10-CM | POA: Diagnosis not present

## 2021-12-29 DIAGNOSIS — I5032 Chronic diastolic (congestive) heart failure: Secondary | ICD-10-CM | POA: Diagnosis not present

## 2021-12-29 HISTORY — PX: CARDIOVERSION: SHX1299

## 2021-12-29 LAB — BASIC METABOLIC PANEL
Anion gap: 7 (ref 5–15)
BUN: 17 mg/dL (ref 8–23)
CO2: 23 mmol/L (ref 22–32)
Calcium: 9.3 mg/dL (ref 8.9–10.3)
Chloride: 106 mmol/L (ref 98–111)
Creatinine, Ser: 0.79 mg/dL (ref 0.44–1.00)
GFR, Estimated: >60 mL/min (ref >60)
Glucose, Bld: 113 mg/dL — ABNORMAL HIGH (ref 70–99)
Potassium: 4 mmol/L (ref 3.5–5.1)
Sodium: 136 mmol/L (ref 135–145)

## 2021-12-29 LAB — URINALYSIS, ROUTINE W REFLEX MICROSCOPIC
Bacteria, UA: NONE SEEN
Bilirubin Urine: NEGATIVE
Glucose, UA: NEGATIVE mg/dL
Ketones, ur: NEGATIVE mg/dL
Leukocytes,Ua: NEGATIVE
Nitrite: NEGATIVE
Protein, ur: NEGATIVE mg/dL
Specific Gravity, Urine: 1.006 (ref 1.005–1.030)
Squamous Epithelial / HPF: NONE SEEN (ref 0–5)
pH: 5 (ref 5.0–8.0)

## 2021-12-29 LAB — MAGNESIUM: Magnesium: 1.9 mg/dL (ref 1.7–2.4)

## 2021-12-29 LAB — PROTIME-INR
INR: 1.3 — ABNORMAL HIGH (ref 0.8–1.2)
Prothrombin Time: 15.6 seconds — ABNORMAL HIGH (ref 11.4–15.2)

## 2021-12-29 SURGERY — CARDIOVERSION
Anesthesia: General

## 2021-12-29 MED ORDER — MAGNESIUM SULFATE 2 GM/50ML IV SOLN: 2.0000 g | Freq: Once | INTRAVENOUS | Status: DC

## 2021-12-29 MED ORDER — PSYLLIUM 95 % PO PACK
1.0000 | PACK | Freq: Two times a day (BID) | ORAL | Status: DC | PRN
  Filled 2021-12-29: qty 1

## 2021-12-29 MED ORDER — PENTOSAN POLYSULFATE SODIUM 100 MG PO CAPS
100.0000 mg | ORAL_CAPSULE | Freq: Three times a day (TID) | ORAL | Status: DC
  Administered 2021-12-29 – 2021-12-30 (×3): 100 mg via ORAL
  Filled 2021-12-29 (×3): qty 1

## 2021-12-29 MED ORDER — PROPOFOL 10 MG/ML IV BOLUS
INTRAVENOUS | Status: AC
  Filled 2021-12-29: qty 20

## 2021-12-29 MED ORDER — PROPOFOL 10 MG/ML IV BOLUS
INTRAVENOUS | Status: DC | PRN
  Administered 2021-12-29: 40 mg via INTRAVENOUS

## 2021-12-29 MED ORDER — HYDROCHLOROTHIAZIDE 12.5 MG PO TABS: 12.5000 mg | ORAL_TABLET | Freq: Every day | ORAL | Status: DC

## 2021-12-29 MED ORDER — PANTOPRAZOLE SODIUM 40 MG PO TBEC
40.0000 mg | DELAYED_RELEASE_TABLET | Freq: Every day | ORAL | Status: DC
Start: 2021-12-29 — End: 2021-12-30
  Administered 2021-12-29 – 2021-12-30 (×2): 40 mg via ORAL
  Filled 2021-12-29 (×2): qty 1

## 2021-12-29 MED ORDER — LEVOTHYROXINE SODIUM 25 MCG PO TABS
125.0000 ug | ORAL_TABLET | Freq: Every day | ORAL | Status: DC
  Administered 2021-12-30: 125 ug via ORAL
  Filled 2021-12-29: qty 1

## 2021-12-29 MED ORDER — SODIUM CHLORIDE 0.9 % IV SOLN: INTRAVENOUS | Status: DC

## 2021-12-29 MED ORDER — LOSARTAN POTASSIUM 50 MG PO TABS: 100.0000 mg | ORAL_TABLET | Freq: Every day | ORAL | Status: DC

## 2021-12-29 MED ORDER — LOSARTAN POTASSIUM-HCTZ 100-12.5 MG PO TABS: 1.0000 | ORAL_TABLET | Freq: Every day | ORAL | Status: DC

## 2021-12-29 MED ORDER — MELATONIN 5 MG PO TABS
5.0000 mg | ORAL_TABLET | Freq: Every day | ORAL | Status: DC
  Administered 2021-12-29: 5 mg via ORAL
  Filled 2021-12-29: qty 1

## 2021-12-29 MED ORDER — SIMVASTATIN 20 MG PO TABS
20.0000 mg | ORAL_TABLET | Freq: Every day | ORAL | Status: DC
  Administered 2021-12-29: 20 mg via ORAL
  Filled 2021-12-29: qty 1

## 2021-12-29 MED ORDER — DILTIAZEM HCL 30 MG PO TABS
30.0000 mg | ORAL_TABLET | Freq: Four times a day (QID) | ORAL | Status: DC
  Administered 2021-12-29 (×4): 30 mg via ORAL
  Filled 2021-12-29 (×4): qty 1

## 2021-12-29 NOTE — Procedures (Addendum)
Cardioversion procedure note ?For atrial flutter ? ?Procedure Details: ? ?Consent: Risks of procedure as well as the alternatives and risks of each were explained to the (patient/caregiver).  Consent for procedure obtained. ? ?Time Out: Verified patient identification, verified procedure, site/side was marked, verified correct patient position, special equipment/implants available, medications/allergies/relevent history reviewed, required imaging and test results available.  Performed ? ?Patient placed on cardiac monitor, pulse oximetry, supplemental oxygen as necessary.   ?Sedation given: propofol IV per anesthesia team ?Pacer pads placed anterior and posterior chest. ? ? ?Cardioverted 1 time(s).   ?Cardioverted at  150J. Synchronized biphasic ?Converted to NSR ? ? ?Evaluation: ?Findings: Post procedure EKG shows: NSR ?Complications: None ?Patient did tolerate procedure well. ? ?Time Spent Directly with the Patient: ? ?35 minutes  ? ?Debbe Odea, M.D.  ? ?Addendum ?Patient's niece was not available after procedure for updates.  Return to patient's room at about 2 PM, patient doing well, niece was not at bedside.  Patient very appreciative with care obtained.  Tried calling niece for update via the available number on file but went to her voice message. ?

## 2021-12-29 NOTE — Progress Notes (Signed)
Patient up to floor from ED at approx 0745 w/ niece and DPOA, [name], at bedside. Cardizem gtt titrated per MAR. EKG obtained overnight revealing Sinus Tachycardia. Provider overnight aware, ok to continue cardizem gtt per NP. Oriented to room and use of call bell w/ verbalized understanding. Full assessment as documented. Call bell within reach, making needs known. ?

## 2021-12-29 NOTE — Anesthesia Preprocedure Evaluation (Signed)
Anesthesia Evaluation  ?Patient identified by MRN, date of birth, ID band ?Patient awake ? ? ? ?Reviewed: ?Allergy & Precautions, NPO status , Patient's Chart, lab work & pertinent test results ? ?History of Anesthesia Complications ?Negative for: history of anesthetic complications ? ?Airway ?Mallampati: III ? ?TM Distance: >3 FB ?Neck ROM: Full ? ? ? Dental ? ?(+) Implants ?  ?Pulmonary ?neg sleep apnea, neg COPD, Not current smoker,  ?  ?Pulmonary exam normal ?breath sounds clear to auscultation ? ? ? ? ? ? Cardiovascular ?Exercise Tolerance: Poor ?hypertension, Pt. on medications ?+CHF (heart failure with preserved ejection fraction), + PND and + DOE  ?(-) Past MI + dysrhythmias (s/p cardioversion 2020) Atrial Fibrillation + Valvular Problems/Murmurs MVP  ?Rhythm:Irregular Rate:Tachycardia ? ?01/2021 Echo: EF 55-60%, no rwma, GrII DD, nl RV fxn, mod elev PASP. Mild MR, mild-mod TR. ?  ?Neuro/Psych ?neg Seizures PSYCHIATRIC DISORDERS Anxiety   ? GI/Hepatic ?Neg liver ROS, GERD  Medicated and Controlled,  ?Endo/Other  ?neg diabetesHypothyroidism  ? Renal/GU ?negative Renal ROS  ? ?  ?Musculoskeletal ? ?(+) Arthritis , Neuroforaminal stenosis of lumbar spine  ? Abdominal ?  ?Peds ? Hematology ?  ?Anesthesia Other Findings ?Past Medical History: ?No date: (HFpEF) heart failure with preserved ejection fraction (HCC) ?    Comment:  a. 01/2021 Echo: EF 55-60%, no rwma, GrII DD, nl RV fxn,  ?             mod elev PASP. Mild MR, mild-mod TR. ?2011: Abnormal mammogram, unspecified ?2002: Arthritis ?08/01/2017: Chronic pain syndrome ?No date: GERD (gastroesophageal reflux disease) ?1985: Heart murmur ?No date: Hyperlipidemia ?2005: Hypertension ?No date: Hypothyroidism ?No date: Persistent atrial fibrillation/Atypical atrial flutter (HCC) ?    Comment:  a. 04/2019 TEE/DCCV; b. 05/2019 Recurrent  ?             Aflutter-->Amio-->DCCV; c. CHA2DS2VASc = 4-->Eliquis; d.  ?             08/2021  recurrent Afib req DCCV.  Flecainide started; e.  ?             09/2021 Flecainide d/c'd 2/2 brady - couldn't tol bb. ?No date: PSVT (paroxysmal supraventricular tachycardia) (HCC) ?    Comment:  a. 09/2021 Zio: Predominantly sinus rhythm (67; range  ?             49-102).  Rare PACs with occasional PVCs (PVC burden  ?             3.6%).  44 SVT runs-fastest 190, longest 12.9 seconds.   ?             No sustained arrhythmias/prolonged pauses.  No  ?             significant arrhythmia identified corresponding to  ?             patient's symptoms. ? ? Reproductive/Obstetrics ? ?  ? ? ? ? ? ? ? ? ? ? ? ? ? ?  ?  ? ? ? ? ? ? ? ? ?Anesthesia Physical ? ?Anesthesia Plan ? ?ASA: 4 ? ?Anesthesia Plan: General  ? ?Post-op Pain Management: Minimal or no pain anticipated  ? ?Induction: Intravenous ? ?PONV Risk Score and Plan: 3 and Treatment may vary due to age or medical condition and TIVA ? ?Airway Management Planned: Nasal Cannula and Natural Airway ? ?Additional Equipment: None ? ?Intra-op Plan:  ? ?Post-operative Plan:  ? ?Informed Consent: I have reviewed the patients History  and Physical, chart, labs and discussed the procedure including the risks, benefits and alternatives for the proposed anesthesia with the patient or authorized representative who has indicated his/her understanding and acceptance.  ? ? ? ?Dental advisory given ? ?Plan Discussed with: CRNA and Anesthesiologist ? ?Anesthesia Plan Comments: (Discussed risks of anesthesia with patient, including possibility of difficulty with spontaneous ventilation under anesthesia necessitating airway intervention, PONV, and rare risks such as cardiac or respiratory or neurological events, and allergic reactions. Discussed the role of CRNA in patient's perioperative care. Patient understands.)  ? ? ? ? ? ? ?Anesthesia Quick Evaluation ? ?

## 2021-12-29 NOTE — Progress Notes (Signed)
Patient being taken down to specials at this time for cardioversion.  No s/s of distress noted denies pain.  Report was given to specials RN. ?

## 2021-12-29 NOTE — Care Management CC44 (Signed)
Condition Code 44 Documentation Completed ? ?Patient Details  ?Name: Meagan Wilcox ?MRN: [id number] ?Date of Birth: 07/09/1931 ? ? ?Condition Code 44 given:  Yes ?Patient signature on Condition Code 44 notice:  Yes ?Documentation of 2 MD's agreement:  Yes ?Code 44 added to claim:  Yes ? ? ? ?Truddie Hidden, RN ?12/29/2021, 3:56 PM ? ?

## 2021-12-29 NOTE — Progress Notes (Signed)
Nutrition Brief Note ? ?Patient identified on the Malnutrition Screening Tool (MST) Report ? ?Wt Readings from Last 15 Encounters:  ?12/28/21 69.2 kg  ?12/28/21 69.9 kg  ?12/02/21 69.9 kg  ?10/27/21 71.4 kg  ?10/07/21 71.2 kg  ?09/16/21 71.7 kg  ?08/25/21 71.7 kg  ?07/14/21 73.5 kg  ?05/13/21 73.5 kg  ?04/07/21 73 kg  ?03/03/21 74.4 kg  ?12/30/20 74.4 kg  ?07/01/20 75.3 kg  ?06/25/20 75 kg  ?03/24/20 75 kg  ? ?Pt with medical history significant for paroxysmal atrial fibrillation/atrial flutter (s/p cardioversion x3), paroxysmal SVT, bradycardia, hypertension, hyperlipidemia, hypothyroidism, GERD, chronic diastolic CHF, arthritis admitted with a-fib with RVR. ? ?Pt admitted with a-fib with RVR.  ? ?5/17- s/p cardioversion ? ?Case discussed with RN, who reports pt was transferred to chair after cardioversion. She consumed all of her lunch tray.  ? ?Spoke with pt, who was sitting in recliner chair at time of visit. She reports she was hungry and ate all of her lunch today. She reports good appetite PTA, consuming 2-3 meals per day (Breakfast: yogurt or cereal; Lunch: salad; Dinner: meat, starch, and vegetable). She lives alone, but has a lot of support friends and family members.   ? ?Pt denies any recent wt loss. Her UBW is round 150#. Reviewed wt hx; pt has experienced a 2.8% wt loss over the past 3 months, which is not significant for time frame. Pt shares that she has "lost some height and weight over the years" secondary to advanced age.  ? ?Discussed importance of good meal intake to promote healing. Pt expressed appreciation for visit, but denies any further needs.  ? ?Nutrition-Focused physical exam completed. Findings are no fat depletion, no muscle depletion, and mild edema.   ? ?RD will liberalize diet to 2 gram sodium for wider variety of meal selections.  ? ?Medications reviewed and include cardizem, lasix, and magnesium sulfate.  ? ?Labs reviewed. ? ?Current diet order is Heart Healthy, patient is  consuming approximately 100% of meals at this time. Labs and medications reviewed.  ? ?No nutrition interventions warranted at this time. If nutrition issues arise, please consult RD.  ? ?Levada Schilling, RD, LDN, CDCES ?Registered Dietitian II ?Certified Diabetes Care and Education Specialist ?Please refer to [facility] for RD and/or RD on-call/weekend/after hours pager   ?

## 2021-12-29 NOTE — Transfer of Care (Signed)
Immediate Anesthesia Transfer of Care Note ? ?Patient: Meagan Wilcox ? ?Procedure(s) Performed: CARDIOVERSION ? ?Patient Location: PACU and Cath Lab ? ?Anesthesia Type:General ? ?Level of Consciousness: drowsy ? ?Airway & Oxygen Therapy: Patient Spontanous Breathing and Patient connected to nasal cannula oxygen ? ?Post-op Assessment: Report given to RN ? ?Post vital signs: stable ? ?Last Vitals:  ?Vitals Value Taken Time  ?BP 94/52 12/29/21 0923  ?Temp    ?Pulse    ?Resp    ?SpO2 99 % 12/29/21 0920  ? ? ?Last Pain:  ?Vitals:  ? 12/29/21 0847  ?TempSrc: Oral  ?PainSc:   ?   ? ?  ? ?Complications: No notable events documented. ?

## 2021-12-29 NOTE — TOC Initial Note (Signed)
Transition of Care (TOC) - Initial/Assessment Note  ? ? ?Patient Details  ?Name: JULIYAH MERGEN ?MRN: [id number] ?Date of Birth: 03/13/1931 ? ?Transition of Care (TOC) CM/SW Contact:    ?Truddie Hidden, RN ?Phone Number: ?12/29/2021, 2:10 PM ? ?Clinical Narrative:                 ? ?Transition of Care (TOC) Screening Note ? ? ?Patient Details  ?Name: VITALIA STOUGH ?Date of Birth: June 09, 1931 ? ? ?Transition of Care (TOC) CM/SW Contact:    ?Truddie Hidden, RN ?Phone Number: ?12/29/2021, 2:10 PM ? ? ? ?Transition of Care Department [facility]) has reviewed patient and no TOC needs have been identified at this time. We will continue to monitor patient advancement through interdisciplinary progression rounds. If new patient transition needs arise, please place a TOC consult. ? ? ?  ?  ? ? ?Patient Goals and CMS Choice ?  ?  ?  ? ?Expected Discharge Plan and Services ?  ?  ?  ?  ?  ?                ?  ?  ?  ?  ?  ?  ?  ?  ?  ?  ? ?Prior Living Arrangements/Services ?  ?  ?  ?       ?  ?  ?  ?  ? ?Activities of Daily Living ?Home Assistive Devices/Equipment: Grab bars in shower, Eyeglasses, Shower chair without back, Walker (specify type), Other (Comment) (cane, lift chair) ?ADL Screening (condition at time of admission) ?Patient's cognitive ability adequate to safely complete daily activities?: Yes ?Is the patient deaf or have difficulty hearing?: Yes ?Does the patient have difficulty seeing, even when wearing glasses/contacts?: No ?Does the patient have difficulty concentrating, remembering, or making decisions?: No ?Patient able to express need for assistance with ADLs?: Yes ?Does the patient have difficulty dressing or bathing?: No ?Independently performs ADLs?: Yes (appropriate for developmental age) ?Does the patient have difficulty walking or climbing stairs?: Yes ?Weakness of Legs: Both ?Weakness of Arms/Hands: None ? ?Permission Sought/Granted ?  ?  ?   ?   ?   ?   ? ?Emotional Assessment ?  ?  ?  ?  ?  ?  ? ?Admission  diagnosis:  Hypokalemia [E87.6] ?Paroxysmal atrial fibrillation with rapid ventricular response (HCC) [I48.0] ?Atrial flutter, unspecified type (HCC) [I48.92] ?Patient Active Problem List  ? Diagnosis Date Noted  ? Paroxysmal atrial fibrillation with rapid ventricular response (HCC) 12/28/2021  ? Hypothyroidism   ? Atrial fibrillation with rapid ventricular response (HCC) 08/23/2021  ? PVC's (premature ventricular contractions) 03/04/2021  ? Bradycardia 03/04/2021  ? Persistent atrial fibrillation (HCC) 12/31/2020  ? Chronic heart failure with preserved ejection fraction (HFpEF) (HCC) 12/31/2020  ? Hyperlipidemia 12/31/2020  ? Long term current use of amiodarone 07/02/2020  ? Annual physical exam 06/25/2020  ? Anxiety 03/24/2020  ? Fatigue 01/30/2020  ? Nausea 01/30/2020  ? Dyspnea on exertion 01/30/2020  ? Varicose veins of leg with edema, bilateral 12/23/2019  ? Atrial flutter with rapid ventricular response (HCC) 05/07/2019  ? Lumbar radiculopathy 08/01/2017  ? Neuroforaminal stenosis of lumbar spine 08/01/2017  ? Lumbar degenerative disc disease 08/01/2017  ? Atypical ductal hyperplasia of left breast 08/31/2016  ? Essential hypertension   ? ?PCP:  Lauro Regulus, MD ?Pharmacy:   ?[facility] [facility] [place] [address] ?[address] ?[place] [address] ?  Phone: [phone] Fax: [phone] ? ?[id number] - [place], [place] - [address] AT [facility] OF [address] ?[address] ?[place] [address] ?Phone: [phone] Fax: [phone] ? ? ? ? ?Social Determinants of Health (SDOH) Interventions ?  ? ?Readmission Risk Interventions ?   ? View : No data to display.  ?  ?  ?  ? ? ? ?

## 2021-12-29 NOTE — Telephone Encounter (Signed)
Spoke with patients niece per release form. Reviewed appointment date and time for them to see Dr. Caryl Comes once she is discharged. Confirmed appointment with Dr. Caryl Comes for 01/11/22 at 08:40 am.  ?

## 2021-12-29 NOTE — Care Management Obs Status (Signed)
MEDICARE OBSERVATION STATUS NOTIFICATION ? ? ?Patient Details  ?Name: Meagan Wilcox ?MRN: [id number] ?Date of Birth: [birth year] ? ? ?Medicare Observation Status Notification Given:  Yes ? ? ? ?Truddie Hidden, RN ?12/29/2021, 3:56 PM ?

## 2021-12-29 NOTE — Consult Note (Signed)
? ? ? ?Cardiology Consultation:  ? ?Patient ID: Meagan Wilcox; [id number]; 1931-02-18  ? ?Admit date: 12/28/2021 ?Date of Consult: 12/29/2021 ? ?Primary Care Provider: Kirk Ruths, MD ?Primary Cardiologist: End ?Primary Electrophysiologist:  Caryl Comes ? ? ?Patient Profile:  ? ?Meagan Wilcox is a 88 y.o. female with a hx of persistent Afib/atypical atrial flutter, pSVT, HFpEF, HTN, HLD, hypothyroidism, arthritis, and GERD who was sent to the ED from our office on 12/28/2021 with recurrent Afib with RVR, and we are seeing for the same at the request of Dr. Mal Misty. ? ?History of Present Illness:  ? ?Ms. Fitchwas diagnosed with Afib/atypical atrial flutter, and underwent TEE-guided DCCV in 2020.  She had recurrent tachy-palpitations in 05/2019, and was found to be in atrial flutter with 2:1 AV block.  She was placed on amiodarone and underwent repeat DCCV in 05/2019.  Amiodarone dose was reduced to 200 mg daily in 06/2019, in the setting of nausea and fatigue.  This was reduced further to 100 mg daily in the setting of persistent nausea and fatigue in 01/2020.  In 12/2020, she experienced dyspnea and fatigue, as well as bradycardia on home monitoring.  Outpatient cardiac monitoring showed an average heart rate of 55 bpm with a 5.1% PVC burden, 10 brief atrial runs, and no evidence of recurrent Afib/flutter.  Triggered events were associated sinus rhythm and PVCs, as well as junctional rhythm.  Echo at that time showed an EF of 55-60 % with grade 2 diastolic dysfunction.  Amiodarone was subsequently discontinued in 02/2021 in the setting of frequent nausea and dizziness.  ?   ?She was admitted to [facility] in 08/2021, in the setting of fatigue and palpitations with recurrent atrial flutter with rates in the 160s bpm.  She was seen by EP and placed on flecainide 50 mg twice daily as well as nadolol.  TEE-guided DCCV was carried out and she was noted to be bradycardic post cardioversion.  Nadolol was discontinued in favor of  pindolol.  At follow-up in early 09/2021, she was bradycardic leading beta-blocker and flecainide to be discontinued.  Repeat Zio monitoring showed no Afib or flutter.  She had 44 brief runs of atrial tachycardia.  She has since been seen by Dr. Caryl Comes.  At her visit with him on 12/02/2021, she reported an episode of recurrent tachycardia associated with lightheadedness, dyspnea, and chest discomfort lasting about 8 hours and resolving spontaneously.  It was felt that this likely represented atrial fibrillation and she was placed on verapamil 40 mg every 4 hours x 3 doses as needed for breakthrough tachycardia.  It was felt that she might require backup bradycardia pacing and resumption of AV nodal blocking agents versus monitoring to see whether Tikosyn therapy might be an appropriate antiarrhythmic for her.  ?She was seen in the office on 12/28/2021, noting  irregular tachy-palpitations and fatigue that began on 12/25/2021.  She used several doses of verapamil on 5/13 and rates improved into the 70s bpm by night fall.  She had recurrent elevations in the 90s and low 100s bpm throughout the following two days, prompting her to take additional verapamil.   At her visit in our office on 5/16, she reported her heart rates had been in the 120s bpm and higher, and reported onset of nausea, fatigue, and weakness, just prior to presenting for her appointment.  In her office, she was noted to be in atrial flutter with RVR with a ventricular are of 121 bpm.  It was documented she  had not missed any doses of Eliquis.  She was transferred to the ED for further management and hospital admission.  ? ?In the ED, her BP remained stable.  She remained in atrial flutter with RVR with a rate of 116 bpm with aberrancy upon arrival to the ED. Labs showed a normal high sensitivity troponin, potassium 3.4, BUN 21, SCr 0.8, unremarkable CBC, and a normal TSH.  CXR showed no acute cardiopulmonary process.  She was given 40 mEq of KCl and  placed on a diltiazem gtt.  Upon admission, cardiology was consulted.  ? ?Overnight, she remained in atrial flutter with RVR with ventricular rates in the 110s bpm.  Around 04:15 on 5/17, she developed Afib with controlled ventricular response.  This morning, she is tired, noting she did not sleep well. No symptoms of angina or decompensation.  She does continue to note palpitations.  She again notes she has not missed any doses of [place] and denies any symptoms concerning for bleeding or falls. She remians on diltiazem gtt.  She is NPO. ? ? ?Past Medical History:  ?Diagnosis Date  ? (HFpEF) heart failure with preserved ejection fraction ([place])   ? a. 01/2021 Echo: EF 55-60%, no rwma, GrII DD, nl RV fxn, mod elev PASP. Mild MR, mild-mod TR.  ? Abnormal mammogram, unspecified 2011  ? Arthritis 2002  ? Chronic pain syndrome 08/01/2017  ? GERD (gastroesophageal reflux disease)   ? Heart murmur 1985  ? Hyperlipidemia   ? Hypertension 2005  ? Hypothyroidism   ? Persistent atrial fibrillation/Atypical atrial flutter ([place])   ? a. 04/2019 TEE/DCCV; b. 05/2019 Recurrent Aflutter-->Amio-->DCCV; c. CHA2DS2VASc = 4-->Eliquis; d. 08/2021 recurrent Afib req DCCV.  Flecainide started; e. 09/2021 Flecainide d/c'd 2/2 brady - couldn't tol bb.  ? PSVT (paroxysmal supraventricular tachycardia) ([place])   ? a. 09/2021 Zio: Predominantly sinus rhythm (67; range 49-102).  Rare PACs with occasional PVCs (PVC burden 3.6%).  44 SVT runs-fastest 190, longest 12.9 seconds.  No sustained arrhythmias/prolonged pauses.  No significant arrhythmia identified corresponding to patient's symptoms.  ? ? ?Past Surgical History:  ?Procedure Laterality Date  ? APPENDECTOMY  1939  ? BREAST BIOPSY Left 02-24-14  ? benign  ? BREAST BIOPSY Left 02/11/2016  ? neg  ? BREAST BIOPSY Left 2013  ? neg  ? BREAST EXCISIONAL BIOPSY Left 2014  ? neg  ? BREAST SURGERY Left 2013  ? stereotactic biopsy   ? BREAST SURGERY Left May 2010  ? 1 mm foci of atypical ductal hyperplasia  ?  CARDIOVERSION N/A 05/08/2019  ? Procedure: CARDIOVERSION;  Surgeon: Kate Sable, MD;  Location: [facility] ORS;  Service: Cardiovascular;  Laterality: N/A;  ? CARDIOVERSION N/A 05/31/2019  ? Procedure: CARDIOVERSION;  Surgeon: Minna Merritts, MD;  Location: [facility] ORS;  Service: Cardiovascular;  Laterality: N/A;  ? CARDIOVERSION N/A 08/25/2021  ? Procedure: CARDIOVERSION;  Surgeon: Kate Sable, MD;  Location: [facility] ORS;  Service: Cardiovascular;  Laterality: N/A;  ? cataract surgery  Bilateral 2009  ? COLONOSCOPY  2010  ? Dr. Vira Agar  ? left breast biopsy   2009  ? TEE WITHOUT CARDIOVERSION N/A 05/08/2019  ? Procedure: TRANSESOPHAGEAL ECHOCARDIOGRAM (TEE);  Surgeon: Kate Sable, MD;  Location: [facility] ORS;  Service: Cardiovascular;  Laterality: N/A;  ?  ? ?Home Meds: ?Prior to Admission medications   ?Medication Sig Start Date End Date Taking? Authorizing Provider  ?acetaminophen (TYLENOL) 325 MG tablet Take 325 mg by mouth every 6 (six) hours as needed for mild pain, fever, headache  or moderate pain.   Yes [provider]  ?amitriptyline (ELAVIL) 25 MG tablet TAKE 1 TABLET BY MOUTH DAILY ?Patient taking differently: Take 25 mg by mouth at bedtime. 12/22/20  Yes Masoud, Viann Shove, MD  ?BIOTIN PO Take 1 tablet by mouth daily.   Yes [provider]  ?Camphor-Menthol-Methyl Sal (SALONPAS) 3.08-20-08 % PTCH Place 1 patch onto the skin daily as needed (back pain).   Yes [provider]  ?cholestyramine (QUESTRAN) 4 g packet Take 1 packet by mouth 2 (two) times daily. 10/21/21  Yes [provider]  ?ELIQUIS 5 MG TABS tablet TAKE 1 TABLET(5 MG) BY MOUTH TWICE DAILY 12/09/21  Yes End, Harrell Gave, MD  ?ELMIRON 100 MG capsule TAKE 1 CAPSULE BY MOUTH THREE TIMES DAILY ?Patient taking differently: Pt takes bid 10/21/21  Yes Masoud, Viann Shove, MD  ?furosemide (LASIX) 20 MG tablet Take 1 tablet (20 mg total) by mouth daily. 08/31/21  Yes Sreenath, Sudheer B, MD  ?levothyroxine (SYNTHROID) 125 MCG  tablet TAKE 1 TABLET(125 MCG) BY MOUTH DAILY 06/27/21  Yes Masoud, Viann Shove, MD  ?LORazepam (ATIVAN) 0.5 MG tablet Take 1 tablet by mouth every 12 (twelve) hours as needed for anxiety. 09/22/21  Yes Provider, Historical, Jerilynn Mages

## 2021-12-29 NOTE — Anesthesia Postprocedure Evaluation (Signed)
Anesthesia Post Note ? ?Patient: Meagan Wilcox ? ?Procedure(s) Performed: CARDIOVERSION ? ?Patient location during evaluation: [facility] ?Anesthesia Type: General ?Level of consciousness: awake and alert ?Pain management: pain level controlled ?Vital Signs Assessment: post-procedure vital signs reviewed and stable ?Respiratory status: spontaneous breathing, nonlabored ventilation, respiratory function stable and patient connected to nasal cannula oxygen ?Cardiovascular status: blood pressure returned to baseline and stable ?Postop Assessment: no apparent nausea or vomiting ?Anesthetic complications: no ? ? ?No notable events documented. ? ? ?Last Vitals:  ?Vitals:  ? 12/29/21 1000 12/29/21 1121  ?BP: (!) 113/46 (!) 123/55  ?Pulse: 60 64  ?Resp: 20 19  ?Temp:  36.6 ?C  ?SpO2: 99% 99%  ?  ?Last Pain:  ?Vitals:  ? 12/29/21 1121  ?TempSrc: Oral  ?PainSc:   ? ? ?  ?  ?  ?  ?  ?  ? ?Arita Miss ? ? ? ? ?

## 2021-12-29 NOTE — Progress Notes (Addendum)
? ? ? ?Progress Note  ? ? ?Meagan Wilcox  [id number] DOB: Oct 16, 1930  DOA: 12/28/2021 ?PCP: Lauro RegulusAnderson, Marshall W, MD  ? ? ? ? ?Brief Narrative:  ? ? ?Medical records reviewed and are as summarized below: ? ?Meagan Wilcox is a 88 y.o. female Meagan Wilcox is a 88 y.o. female with medical history significant for paroxysmal atrial fibrillation/atrial flutter (s/p cardioversion x3), paroxysmal SVT, bradycardia, hypertension, hyperlipidemia, hypothyroidism, GERD, chronic diastolic CHF, arthritis.  She was recently seen by Dr. Graciela HusbandsKlein, electrophysiologist, for palpitations/tachycardia she was prescribed verapamil to be taken up to 3 times a day as needed for palpitations/tachycardia. ?  ?She complained of palpitations, shortness of breath, dizziness and nausea.  Her symptoms started about 3 days prior to admission.  She took verapamil as instructed and her symptoms improved.  Her symptoms recurred but she did not see any improvement after taking verapamil. She went to the cardiology office for follow-up.  She felt very ill with palpitations, shortness of breath dizziness and nausea.  She vomited once at the doctor's office.  She was referred to the emergency room for further management. ? ? ?She was admitted to the hospital for paroxysmal atrial flutter/atrial fibrillation with RVR.  She was treated with IV Cardizem infusion.  She underwent cardioversion and she converted to normal sinus rhythm. ? ? ? ? ? ?Assessment/Plan:  ? ?Principal Problem: ?  Atrial flutter with rapid ventricular response (HCC) ?Active Problems: ?  Anxiety ?  Chronic heart failure with preserved ejection fraction (HFpEF) (HCC) ?  Paroxysmal atrial fibrillation with rapid ventricular response (HCC) ?  Paroxysmal atrial flutter (HCC) ? ? ? ?Body mass index is 27.02 kg/m?. ? ?Paroxysmal atrial flutter with RVR: S/p cardioversion on 12/29/2021.  Continue short-acting oral Cardizem with plan to transition to long-acting oral Cardizem tomorrow.  Continue  Eliquis for stroke prophylaxis. ? ?Hypokalemia: Improved ?  ?Chronic diastolic CHF: Compensated.  Continue Lasix ?  ?Anxiety: Continue Ativan as needed ?  ? ? ? ? ?Diet Order   ? ?       ?  Diet Heart Room service appropriate? Yes; Fluid consistency: Thin  Diet effective now       ?  ? ?  ?  ? ?  ? ? ? ? ? ? ? ? ? ? ? ?Consultants: ?Cardiologist ? ?Procedures: ?S/p cardioversion on 12/29/2021 ? ? ? ?Medications:  ? ? amitriptyline  25 mg Oral QHS  ? apixaban  5 mg Oral BID  ? diltiazem  30 mg Oral QID  ? furosemide  20 mg Oral Daily  ? [START ON 12/30/2021] levothyroxine  125 mcg Oral Q0600  ? losartan-hydrochlorothiazide  1 tablet Oral Daily  ? melatonin  5 mg Oral QHS  ? pantoprazole  40 mg Oral Daily  ? pentosan polysulfate  100 mg Oral TID  ? simvastatin  20 mg Oral Daily  ? ?Continuous Infusions: ? magnesium sulfate bolus IVPB    ? ? ? ?Anti-infectives (From admission, onward)  ? ? None  ? ?  ? ? ? ? ? ? ? ? ? ?Family Communication/Anticipated D/C date and plan/Code Status  ? ?DVT prophylaxis:  ?apixaban (ELIQUIS) tablet 5 mg  ? ?  Code Status: Full Code ? ?Family Communication: None ?Disposition Plan: Plan to discharge home tomorrow ? ? ?Status is: Inpatient ?Remains inpatient appropriate because: Monitor heart rate on the Cardizem s/p cardioversion ? ? ? ? ? ? ?Subjective:  ? ?Interval events noted.  She feels  better.  She has no complaints.  No chest pain or shortness of breath. ? ?Objective:  ? ? ?Vitals:  ? 12/29/21 0945 12/29/21 1000 12/29/21 1121 12/29/21 1412  ?BP: (!) 109/42 (!) 113/46 (!) 123/55 (!) 138/52  ?Pulse: 61 60 64 62  ?Resp: 14 20 19 16   ?Temp:   97.8 ?F (36.6 ?C) 98.7 ?F (37.1 ?C)  ?TempSrc:   Oral Oral  ?SpO2: 99% 99% 99% 96%  ?Weight:      ?Height:      ? ?No data found. ? ? ?Intake/Output Summary (Last 24 hours) at 12/29/2021 1543 ?Last data filed at 12/29/2021 1400 ?Gross per 24 hour  ?Intake 143.29 ml  ?Output 750 ml  ?Net -606.71 ml  ? ?Filed Weights  ? 12/28/21 1534 12/28/21 2005   ?Weight: 69.9 kg 69.2 kg  ? ? ?Exam: ? ?GEN: NAD ?SKIN: No rash ?EYES: EOMI ?ENT: MMM ?CV: RRR ?PULM: CTA B ?ABD: soft, ND, NT, +BS ?CNS: AAO x 3, non focal ?EXT: No edema or tenderness ? ? ? ?  ? ? ?Data Reviewed:  ? ?I have personally reviewed following labs and imaging studies: ? ?Labs: ?Labs show the following:  ? ?Basic Metabolic Panel: ?Recent Labs  ?Lab 12/28/21 ?1542 12/28/21 ?1756 12/29/21 ?12/31/21  ?NA 134*  --  136  ?K 3.4*  --  4.0  ?CL 101  --  106  ?CO2 20*  --  23  ?GLUCOSE 110*  --  113*  ?BUN 21  --  17  ?CREATININE 0.80  --  0.79  ?CALCIUM 10.2  --  9.3  ?MG  --  2.1 1.9  ? ?GFR ?Estimated Creatinine Clearance: 44.5 mL/min (by C-G formula based on SCr of 0.79 mg/dL). ?Liver Function Tests: ?No results for input(s): AST, ALT, ALKPHOS, BILITOT, PROT, ALBUMIN in the last 168 hours. ?No results for input(s): LIPASE, AMYLASE in the last 168 hours. ?No results for input(s): AMMONIA in the last 168 hours. ?Coagulation profile ?Recent Labs  ?Lab 12/28/21 ?1542 12/29/21 ?1022  ?INR 1.3* 1.3*  ? ? ?CBC: ?Recent Labs  ?Lab 12/28/21 ?1542  ?WBC 10.0  ?HGB 13.1  ?HCT 39.1  ?MCV 90.3  ?PLT 262  ? ?Cardiac Enzymes: ?No results for input(s): CKTOTAL, CKMB, CKMBINDEX, TROPONINI in the last 168 hours. ?BNP (last 3 results) ?No results for input(s): PROBNP in the last 8760 hours. ?CBG: ?No results for input(s): GLUCAP in the last 168 hours. ?D-Dimer: ?No results for input(s): DDIMER in the last 72 hours. ?Hgb A1c: ?No results for input(s): HGBA1C in the last 72 hours. ?Lipid Profile: ?No results for input(s): CHOL, HDL, LDLCALC, TRIG, CHOLHDL, LDLDIRECT in the last 72 hours. ?Thyroid function studies: ?Recent Labs  ?  12/28/21 ?1542  ?TSH 0.639  ? ?Anemia work up: ?No results for input(s): VITAMINB12, FOLATE, FERRITIN, TIBC, IRON, RETICCTPCT in the last 72 hours. ?Sepsis Labs: ?Recent Labs  ?Lab 12/28/21 ?1542  ?WBC 10.0  ? ? ?Microbiology ?Recent Results (from the past 240 hour(s))  ?Resp Panel by RT-PCR (Flu A&B,  Covid) Nasopharyngeal Swab     Status: None  ? Collection Time: 12/28/21  3:46 PM  ? Specimen: Nasopharyngeal Swab; Nasopharyngeal(NP) swabs in vial transport medium  ?Result Value Ref Range Status  ? SARS Coronavirus 2 by RT PCR NEGATIVE NEGATIVE Final  ?  Comment: (NOTE) ?SARS-CoV-2 target nucleic acids are NOT DETECTED. ? ?The SARS-CoV-2 RNA is generally detectable in upper respiratory ?specimens during the acute phase of infection. The lowest ?concentration of SARS-CoV-2  viral copies this assay can detect is ?138 copies/mL. A negative result does not preclude SARS-Cov-2 ?infection and should not be used as the sole basis for treatment or ?other patient management decisions. A negative result may occur with  ?improper specimen collection/handling, submission of specimen other ?than nasopharyngeal swab, presence of viral mutation(s) within the ?areas targeted by this assay, and inadequate number of viral ?copies(<138 copies/mL). A negative result must be combined with ?clinical observations, patient history, and epidemiological ?information. The expected result is Negative. ? ?Fact Sheet for Patients:  ?BloggerCourse.com ? ?Fact Sheet for Healthcare Providers:  ?SeriousBroker.it ? ?This test is no t yet approved or cleared by the [place] FDA and  ?has been authorized for detection and/or diagnosis of SARS-CoV-2 by ?FDA under an Emergency Use Authorization (EUA). This EUA will remain  ?in effect (meaning this test can be used) for the duration of the ?COVID-19 declaration under Section 564(b)(1) of the Act, 21 ?U.S.C.section 360bbb-3(b)(1), unless the authorization is terminated  ?or revoked sooner.  ? ? ?  ? Influenza A by PCR NEGATIVE NEGATIVE Final  ? Influenza B by PCR NEGATIVE NEGATIVE Final  ?  Comment: (NOTE) ?The Xpert Xpress SARS-CoV-2/FLU/RSV plus assay is intended as an aid ?in the diagnosis of influenza from Nasopharyngeal swab specimens and ?should  not be used as a sole basis for treatment. Nasal washings and ?aspirates are unacceptable for Xpert Xpress SARS-CoV-2/FLU/RSV ?testing. ? ?Fact Sheet for Patients: ?BloggerCourse.com ? ?

## 2021-12-30 DIAGNOSIS — F419 Anxiety disorder, unspecified: Secondary | ICD-10-CM | POA: Diagnosis not present

## 2021-12-30 DIAGNOSIS — I5032 Chronic diastolic (congestive) heart failure: Secondary | ICD-10-CM | POA: Diagnosis not present

## 2021-12-30 DIAGNOSIS — I4892 Unspecified atrial flutter: Secondary | ICD-10-CM | POA: Diagnosis not present

## 2021-12-30 DIAGNOSIS — E876 Hypokalemia: Secondary | ICD-10-CM | POA: Diagnosis not present

## 2021-12-30 LAB — CBC
HCT: 34 % — ABNORMAL LOW (ref 36.0–46.0)
Hemoglobin: 11.2 g/dL — ABNORMAL LOW (ref 12.0–15.0)
MCH: 30.4 pg (ref 26.0–34.0)
MCHC: 32.9 g/dL (ref 30.0–36.0)
MCV: 92.1 fL (ref 80.0–100.0)
Platelets: 232 10*3/uL (ref 150–400)
RBC: 3.69 MIL/uL — ABNORMAL LOW (ref 3.87–5.11)
RDW: 13.9 % (ref 11.5–15.5)
WBC: 6.9 10*3/uL (ref 4.0–10.5)
nRBC: 0 % (ref 0.0–0.2)

## 2021-12-30 LAB — BASIC METABOLIC PANEL
Anion gap: 8 (ref 5–15)
BUN: 20 mg/dL (ref 8–23)
CO2: 24 mmol/L (ref 22–32)
Calcium: 9 mg/dL (ref 8.9–10.3)
Chloride: 104 mmol/L (ref 98–111)
Creatinine, Ser: 0.97 mg/dL (ref 0.44–1.00)
GFR, Estimated: 56 mL/min — ABNORMAL LOW (ref >60)
Glucose, Bld: 130 mg/dL — ABNORMAL HIGH (ref 70–99)
Potassium: 3.8 mmol/L (ref 3.5–5.1)
Sodium: 136 mmol/L (ref 135–145)

## 2021-12-30 MED ORDER — LOSARTAN POTASSIUM 50 MG PO TABS
50.0000 mg | ORAL_TABLET | Freq: Every day | ORAL | Status: DC
  Administered 2021-12-30: 50 mg via ORAL
  Filled 2021-12-30: qty 1

## 2021-12-30 MED ORDER — LOSARTAN POTASSIUM-HCTZ 50-12.5 MG PO TABS: 1.0000 | ORAL_TABLET | Freq: Every day | ORAL | 0 refills | Status: DC

## 2021-12-30 MED ORDER — DILTIAZEM HCL ER COATED BEADS 120 MG PO CP24
120.0000 mg | ORAL_CAPSULE | Freq: Every day | ORAL | Status: DC
  Administered 2021-12-30: 120 mg via ORAL
  Filled 2021-12-30: qty 1

## 2021-12-30 MED ORDER — DILTIAZEM HCL ER COATED BEADS 120 MG PO CP24
120.0000 mg | ORAL_CAPSULE | Freq: Every day | ORAL | 0 refills | Status: DC
Start: 2021-12-30 — End: 2022-01-26

## 2021-12-30 MED ORDER — HYDROCHLOROTHIAZIDE 12.5 MG PO TABS
12.5000 mg | ORAL_TABLET | Freq: Every day | ORAL | Status: DC
  Administered 2021-12-30: 12.5 mg via ORAL
  Filled 2021-12-30: qty 1

## 2021-12-30 NOTE — Progress Notes (Signed)
Progress Note  Patient Name: Meagan Wilcox Date of Encounter: 12/30/2021  [facility] Cardiologist: Nelva Bush, MD   Subjective   Patient remains in NSR. She is feeling much better. No chest pain or SOB. She is requesting to go home today, concerns regarding a ride home.   Inpatient Medications    Scheduled Meds:  amitriptyline  25 mg Oral QHS   apixaban  5 mg Oral BID   diltiazem  30 mg Oral QID   furosemide  20 mg Oral Daily   losartan  100 mg Oral Daily   And   hydrochlorothiazide  12.5 mg Oral Daily   levothyroxine  125 mcg Oral Q0600   melatonin  5 mg Oral QHS   pantoprazole  40 mg Oral Daily   pentosan polysulfate  100 mg Oral TID   simvastatin  20 mg Oral QHS   Continuous Infusions:  magnesium sulfate bolus IVPB     PRN Meds: acetaminophen **OR** acetaminophen, LORazepam, melatonin, ondansetron **OR** ondansetron (ZOFRAN) IV, polyethylene glycol, psyllium   Vital Signs    Vitals:   12/29/21 1624 12/29/21 1926 12/29/21 2333 12/30/21 0359  BP: (!) 130/56 (!) 120/50 (!) 112/45 (!) 119/47  Pulse: 66 63 64 61  Resp: 17 18 18 18   Temp: 98.2 F (36.8 C) 98.2 F (36.8 C) 98.6 F (37 C) 98 F (36.7 C)  TempSrc: Oral  Oral Oral  SpO2: 100% 95% 94% 94%  Weight:      Height:        Intake/Output Summary (Last 24 hours) at 12/30/2021 0737 Last data filed at 12/30/2021 0431 Gross per 24 hour  Intake 494.33 ml  Output 1150 ml  Net -655.67 ml      12/28/2021    8:05 PM 12/28/2021    3:34 PM 12/28/2021    2:47 PM  Last 3 Weights  Weight (lbs) 152 lb 8.9 oz 154 lb 1.6 oz 154 lb  Weight (kg) 69.2 kg 69.9 kg 69.854 kg      Telemetry    NSR, PVCs, HR 60s - Personally Reviewed  ECG    No new - Personally Reviewed  Physical Exam   GEN: No acute distress.   Neck: No JVD Cardiac: RRR, no murmurs, rubs, or gallops.  Respiratory: Clear to auscultation bilaterally. GI: Soft, nontender, non-distended  MS: No edema; No deformity. Neuro:  Nonfocal   Psych: Normal affect   Labs    High Sensitivity Troponin:   Recent Labs  Lab 12/28/21 1542 12/28/21 2028  TROPONINIHS 11 11     Chemistry Recent Labs  Lab 12/28/21 1542 12/28/21 1756 12/29/21 0549  NA 134*  --  136  K 3.4*  --  4.0  CL 101  --  106  CO2 20*  --  23  GLUCOSE 110*  --  113*  BUN 21  --  17  CREATININE 0.80  --  0.79  CALCIUM 10.2  --  9.3  MG  --  2.1 1.9  GFRNONAA >60  --  >60  ANIONGAP 13  --  7    Lipids No results for input(s): CHOL, TRIG, HDL, LABVLDL, LDLCALC, CHOLHDL in the last 168 hours.  Hematology Recent Labs  Lab 12/28/21 1542  WBC 10.0  RBC 4.33  HGB 13.1  HCT 39.1  MCV 90.3  MCH 30.3  MCHC 33.5  RDW 13.6  PLT 262   Thyroid  Recent Labs  Lab 12/28/21 1542 12/28/21 1756  TSH 0.639  --  FREET4  --  1.45*    BNP Recent Labs  Lab 12/28/21 1602  BNP 111.7*    DDimer No results for input(s): DDIMER in the last 168 hours.   Radiology    DG Chest Port 1 View  Result Date: 12/28/2021 CLINICAL DATA:  Pt here with a fib RVR with a rate in the 120s. Pt's family states it started on Sat. Pt took her medication and it would not bring it down. Pt hx heart murmur, heart failure, Afib, HTN. Nonsmoker EXAM: PORTABLE CHEST - 1 VIEW COMPARISON:  08/23/2021 FINDINGS: Lungs are clear. Heart size and mediastinal contours are within normal limits. Aortic Atherosclerosis (ICD10-170.0). No effusion. Visualized bones unremarkable. IMPRESSION: No acute cardiopulmonary disease. Electronically Signed   By: Corlis Leak M.D.   On: 12/28/2021 16:03    Cardiac Studies   Zio patch 09/2021: The patient was monitored for 12 days. The predominant rhythm was sinus with an average rate of 67 bpm (range 49-102 bpm in sinus). There were rare PACs and occasional PVCs (PVC burden 3.6%). 44 atrial runs occurred, lasting up to 12.9 seconds with a maximum rate of 190 bpm. No sustained arrhythmia or prolonged pause was observed. Patient triggered events  correspond to sinus rhythm and PVCs.   Predominantly sinus rhythm with rare PACs and occasional PVCs.  Multiple runs of PSVT noted.  No significant arrhythmia identified corresponding to patient's symptoms. __________   2D echo 02/04/2022: 1. Left ventricular ejection fraction, by estimation, is 55 to 60%. The  left ventricle has normal function. The left ventricle has no regional  wall motion abnormalities. There is mild left ventricular hypertrophy.  Left ventricular diastolic parameters  are consistent with Grade II diastolic dysfunction (pseudonormalization).   2. Right ventricular systolic function is normal. The right ventricular  size is mildly enlarged. There is moderately elevated pulmonary artery  systolic pressure.   3. Right atrial size was mildly dilated.   4. The mitral valve is normal in structure. Mild mitral valve  regurgitation.   5. Tricuspid valve regurgitation is mild to moderate.   6. The aortic valve was not well visualized. Aortic valve regurgitation  is not visualized.   7. The inferior vena cava is normal in size with greater than 50%  respiratory variability, suggesting right atrial pressure of 3 mmHg.   Comparison(s): EF 55-60%. ___________   Luci Bank patch 12/2020: The patient was monitored for 13 days, 22 hours. The predominant rhythm was sinus with an average rate of 55 bpm (range 40-90 bpm in sinus). Periods of junctional rhythm were noted. There were rare PAC's and frequent PVC's (5.1% PVC burden). Ten atrial runs occurred, lasting up to 18 beats with a maximum rate of 126 bpm. No prolonged pause was observed. There obvious atrial fibrillation. Patient triggered events correspond to sinus rhythm, PVC's, and junctional rhythm.   Predominantly sinus rhythm with intermittent junctional rhythm, as well as frequent PVC's (5.1% burden).  Rare PAC's and brief PSVT also noted. __________   TEE 05/08/2019: 1. Left ventricular ejection fraction, by visual  estimation, is 55 to  60%. The left ventricle has normal function. Normal left ventricular size.  There is no left ventricular hypertrophy.   2. Global right ventricle has normal systolic function.The right  ventricular size is normal. Right vetricular wall thickness was not  assessed.   3. Left atrial size was not well visualized.   4. Mildly dilated. no evidence of LA thrombus, velocity of 50cm/s in LA  appendage.  5. Right atrial size was normal.   6. The mitral valve is normal in structure. Mild mitral valve  regurgitation.   7. The tricuspid valve is normal in structure. Tricuspid valve  regurgitation is mild.   8. The aortic valve is tricuspid Aortic valve regurgitation was not  visualized by color flow Doppler. Structurally normal aortic valve, with  no evidence of sclerosis or stenosis.   9. The pulmonic valve was not well visualized. Pulmonic valve  regurgitation is not visualized by color flow Doppler. __________   2D echo 05/07/2019: 1. Left ventricular ejection fraction, by visual estimation, is 55 to  60%. The left ventricle has normal function. Normal left ventricular size.  There is mildly increased left ventricular hypertrophy.   2. Left ventricular diastolic Doppler parameters are indeterminate  pattern of LV diastolic filling.   3. Global right ventricle has normal systolic function.The right  ventricular size is normal. No increase in right ventricular wall  thickness.   4. Left atrial size was mildly dilated.   5. Mild to moderate mitral valve regurgitation.   6. Tricuspid valve regurgitation is mild.   7. Mild to Moderately elevated pulmonary artery systolic pressure.  Patient Profile     88 y.o. female persistent Afib/atypical atrial flutter, pSVT, HFpEF, HTN, HLD, hypothyroidism, arthritis, and GERD who was sent to the ED from our office on 12/28/2021 with recurrent Afib with RVR.  Assessment & Plan    Paroxysmal Afib  - presented with symptomatic  afib/flutter RVR s/p cardioversion day of admission, she had not missed doses of Eliquis.  - she remains in NSR - H/o bradycardia with CBB and BB in SR - CHADSVASC of 5 - continue Eliquis 5mg  BID - consolidate dilt to 120mg  daily, monitor for bradycardia - plan to see EP as OP, she has f/u 5/30  HFpEF - euvolemic  - continue PTA lasix 20mg  daily  HTN - BP is well controlled - diltiazem 120mg  daily - losartan-HCTA  HLD - continue PTA statin - needs updated labs  PVCs - occasional PVCs - diltiazem 120mg  daily  For questions or updates, please contact [facility] Please consult [email or web address] for contact info under        Signed, Adanna Zuckerman Ninfa Meeker, PA-C  12/30/2021, 7:37 AM

## 2021-12-30 NOTE — Discharge Summary (Signed)
Physician Discharge Summary   Patient: Meagan Wilcox MRN: [id number] DOB: 05/19/1931  Admit date:     12/28/2021  Discharge date: 12/30/21  Discharge Physician: Marrion Coy   PCP: Lauro Regulus, MD   Recommendations at discharge:   Follow-up with PCP in 1 week. Follow-up with Dr. And as scheduled.  Discharge Diagnoses: Principal Problem:   Atrial flutter with rapid ventricular response (HCC) Active Problems:   Anxiety   Chronic heart failure with preserved ejection fraction (HFpEF) (HCC)   Paroxysmal atrial fibrillation with rapid ventricular response (HCC)   Paroxysmal atrial flutter (HCC)  Resolved Problems:   * No resolved hospital problems. *  Hospital Course: Meagan Wilcox is a 88 y.o. female with medical history significant for paroxysmal atrial fibrillation/atrial flutter (s/p cardioversion x3), paroxysmal SVT, bradycardia, hypertension, hyperlipidemia, hypothyroidism, GERD, chronic diastolic CHF, arthritis.  She was recently seen by Dr. Graciela Husbands, electrophysiologist, for palpitations/tachycardia she was prescribed verapamil to be taken up to 3 times a day as needed for palpitations/tachycardia. She is admitted in the hospital with palpitation, shortness of breath and dizziness.  She was found to have atrial fibrillation with RVR.  She was cardioverted by cardiology yesterday.  Afterwards she was started on diltiazem.  She remained in sinus rhythm, cardiology cleared patient for discharge.  Assessment and Plan: Paroxysmal atrial fibrillation with rapid regular response. Status post cardioversion. Patient is seen by cardiology, apparently, patient had recurrent atrial fibrillation recently.  Currently patient is sinus, discussed with cardiology, will continue diltiazem, reduced dose of losartan.  Patient is also on verapamil as needed when she is in atrial fibrillation.  Patient also will be followed by cardiology for future treatment options.  Chronic diastolic congestive  heart failure No exacerbation, continue oral Lasix.  Hypokalemia Potassium has normalized.       Consultants: Cardiology Procedures performed: Cardioversion. Disposition: Home Diet recommendation:  Discharge Diet Orders (From admission, onward)     Start     Ordered   12/30/21 0000  Diet - low sodium heart healthy        12/30/21 1019           Cardiac diet DISCHARGE MEDICATION: Allergies as of 12/30/2021       Reactions   Amoxicillin Diarrhea   Penicillins Rash   Did it involve swelling of the face/tongue/throat, SOB, or low BP? No Did it involve sudden or severe rash/hives, skin peeling, or any reaction on the inside of your mouth or nose? No Did you need to seek medical attention at a hospital or doctor's office? Yes When did it last happen?      65 years ago If all above answers are "NO", may proceed with cephalosporin use.        Medication List     STOP taking these medications    losartan-hydrochlorothiazide 100-12.5 MG tablet Commonly known as: HYZAAR Replaced by: losartan-hydrochlorothiazide 50-12.5 MG tablet   naproxen sodium 220 MG tablet Commonly known as: ALEVE       TAKE these medications    acetaminophen 325 MG tablet Commonly known as: TYLENOL Take 325 mg by mouth every 6 (six) hours as needed for mild pain, fever, headache or moderate pain.   amitriptyline 25 MG tablet Commonly known as: ELAVIL TAKE 1 TABLET BY MOUTH DAILY What changed: when to take this   BIOTIN PO Take 1 tablet by mouth daily.   cholestyramine 4 g packet Commonly known as: QUESTRAN Take 1 packet by mouth 2 (two) times daily.  diltiazem 120 MG 24 hr capsule Commonly known as: CARDIZEM CD Take 1 capsule (120 mg total) by mouth daily.   Eliquis 5 MG Tabs tablet Generic drug: apixaban TAKE 1 TABLET(5 MG) BY MOUTH TWICE DAILY   Elmiron 100 MG capsule Generic drug: pentosan polysulfate TAKE 1 CAPSULE BY MOUTH THREE TIMES DAILY What changed:  how much  to take how to take this when to take this additional instructions   furosemide 20 MG tablet Commonly known as: LASIX Take 1 tablet (20 mg total) by mouth daily.   levothyroxine 125 MCG tablet Commonly known as: SYNTHROID TAKE 1 TABLET(125 MCG) BY MOUTH DAILY   LORazepam 0.5 MG tablet Commonly known as: ATIVAN Take 1 tablet by mouth every 12 (twelve) hours as needed for anxiety.   losartan-hydrochlorothiazide 50-12.5 MG tablet Commonly known as: Hyzaar Take 1 tablet by mouth daily. Replaces: losartan-hydrochlorothiazide 100-12.5 MG tablet   melatonin 5 MG Tabs Take 1 tablet (5 mg total) by mouth at bedtime.   multivitamin capsule Take 1 capsule by mouth daily.   omeprazole 20 MG capsule Commonly known as: PRILOSEC TAKE 1 CAPSULE BY MOUTH DAILY   psyllium 28 % packet Commonly known as: METAMUCIL SMOOTH TEXTURE Take 1 packet by mouth 2 (two) times daily as needed (Constipation).   Salonpas 3.08-20-08 % Ptch Generic drug: Camphor-Menthol-Methyl Sal Place 1 patch onto the skin daily as needed (back pain).   simvastatin 20 MG tablet Commonly known as: ZOCOR TAKE 1 TABLET BY MOUTH DAILY   verapamil 40 MG tablet Commonly known as: CALAN Take 1 tablet (40 mg) by mouth every 4 hours up to 3 doses as needed for recurrent tachycardia        Follow-up Information     Lauro Regulus, MD Follow up in 1 week(s).   Specialty: Internal Medicine Contact information: [address] [facility] [place] [address] [phone]         Yvonne Kendall, MD Follow up.   Specialty: Cardiology Why: as scheduled Contact information: [address] [place] [address] [phone]                Discharge Exam: Ceasar Mons Weights   12/28/21 1534 12/28/21 2005  Weight: 69.9 kg 69.2 kg   General exam: Appears calm and comfortable  Respiratory system: Clear to auscultation. Respiratory effort normal. Cardiovascular system:  S1 & S2 heard, RRR. No JVD, murmurs, rubs, gallops or clicks. No pedal edema. Gastrointestinal system: Abdomen is nondistended, soft and nontender. No organomegaly or masses felt. Normal bowel sounds heard. Central nervous system: Alert and oriented. No focal neurological deficits. Extremities: Symmetric 5 x 5 power. Skin: No rashes, lesions or ulcers Psychiatry: Judgement and insight appear normal. Mood & affect appropriate.    Condition at discharge: good  The results of significant diagnostics from this hospitalization (including imaging, microbiology, ancillary and laboratory) are listed below for reference.   Imaging Studies: DG Chest Port 1 View  Result Date: 12/28/2021 CLINICAL DATA:  Pt here with a fib RVR with a rate in the 120s. Pt's family states it started on Sat. Pt took her medication and it would not bring it down. Pt hx heart murmur, heart failure, Afib, HTN. Nonsmoker EXAM: PORTABLE CHEST - 1 VIEW COMPARISON:  08/23/2021 FINDINGS: Lungs are clear. Heart size and mediastinal contours are within normal limits. Aortic Atherosclerosis (ICD10-170.0). No effusion. Visualized bones unremarkable. IMPRESSION: No acute cardiopulmonary disease. Electronically Signed   By: Corlis Leak  M.D.   On: 12/28/2021 16:03    Microbiology: Results for orders placed or performed during the hospital encounter of 12/28/21  Resp Panel by RT-PCR (Flu A&B, Covid) Nasopharyngeal Swab     Status: None   Collection Time: 12/28/21  3:46 PM   Specimen: Nasopharyngeal Swab; Nasopharyngeal(NP) swabs in vial transport medium  Result Value Ref Range Status   SARS Coronavirus 2 by RT PCR NEGATIVE NEGATIVE Final    Comment: (NOTE) SARS-CoV-2 target nucleic acids are NOT DETECTED.  The SARS-CoV-2 RNA is generally detectable in upper respiratory specimens during the acute phase of infection. The lowest concentration of SARS-CoV-2 viral copies this assay can detect is 138 copies/mL. A negative result does not  preclude SARS-Cov-2 infection and should not be used as the sole basis for treatment or other patient management decisions. A negative result may occur with  improper specimen collection/handling, submission of specimen other than nasopharyngeal swab, presence of viral mutation(s) within the areas targeted by this assay, and inadequate number of viral copies(<138 copies/mL). A negative result must be combined with clinical observations, patient history, and epidemiological information. The expected result is Negative.  Fact Sheet for Patients:  BloggerCourse.com  Fact Sheet for Healthcare Providers:  SeriousBroker.it  This test is no t yet approved or cleared by the [place] FDA and  has been authorized for detection and/or diagnosis of SARS-CoV-2 by FDA under an Emergency Use Authorization (EUA). This EUA will remain  in effect (meaning this test can be used) for the duration of the COVID-19 declaration under Section 564(b)(1) of the Act, 21 U.S.C.section 360bbb-3(b)(1), unless the authorization is terminated  or revoked sooner.       Influenza A by PCR NEGATIVE NEGATIVE Final   Influenza B by PCR NEGATIVE NEGATIVE Final    Comment: (NOTE) The Xpert Xpress SARS-CoV-2/FLU/RSV plus assay is intended as an aid in the diagnosis of influenza from Nasopharyngeal swab specimens and should not be used as a sole basis for treatment. Nasal washings and aspirates are unacceptable for Xpert Xpress SARS-CoV-2/FLU/RSV testing.  Fact Sheet for Patients: BloggerCourse.com  Fact Sheet for Healthcare Providers: SeriousBroker.it  This test is not yet approved or cleared by the [place] FDA and has been authorized for detection and/or diagnosis of SARS-CoV-2 by FDA under an Emergency Use Authorization (EUA). This EUA will remain in effect (meaning this test can be used) for the  duration of the COVID-19 declaration under Section 564(b)(1) of the Act, 21 U.S.C. section 360bbb-3(b)(1), unless the authorization is terminated or revoked.  Performed at [facility], [address] [place], [place] [address]     Labs: CBC: Recent Labs  Lab 12/28/21 1542 12/30/21 0904  WBC 10.0 6.9  HGB 13.1 11.2*  HCT 39.1 34.0*  MCV 90.3 92.1  PLT 262 232   Basic Metabolic Panel: Recent Labs  Lab 12/28/21 1542 12/28/21 1756 12/29/21 0549 12/30/21 0904  NA 134*  --  136 136  K 3.4*  --  4.0 3.8  CL 101  --  106 104  CO2 20*  --  23 24  GLUCOSE 110*  --  113* 130*  BUN 21  --  17 20  CREATININE 0.80  --  0.79 0.97  CALCIUM 10.2  --  9.3 9.0  MG  --  2.1 1.9  --    Liver Function Tests: No results for input(s): AST, ALT, ALKPHOS, BILITOT, PROT, ALBUMIN in the last 168 hours. CBG: No results for input(s): GLUCAP in the  last 168 hours.  Discharge time spent: greater than 30 minutes.  Signed: Marrion Coyekui Rosemary Pentecost, MD [facility] 12/30/2021

## 2021-12-30 NOTE — Plan of Care (Signed)

## 2021-12-30 NOTE — Plan of Care (Signed)

## 2021-12-31 ENCOUNTER — Telehealth: Payer: Self-pay | Admitting: Internal Medicine

## 2021-12-31 ENCOUNTER — Ambulatory Visit: Payer: Medicare PPO | Admitting: Internal Medicine

## 2021-12-31 NOTE — Progress Notes (Deleted)
Follow-up Outpatient Visit Date: 12/31/2021  Primary Care Provider: Lauro Regulus, MD [address] [facility] [place] [address]  Chief Complaint: ***  HPI:  Ms. Bevans is a 88 y.o. female with history of persistent atrial fibrillation/flutter requiring multiple hospitalizations complicated by sinus bradycardia limiting rate control options, chronic HFpEF, hypertension, hyperlipidemia, hypothyroidism, GERD, and arthritis, who presents for follow-up of ***.  --------------------------------------------------------------------------------------------------  Past Medical History:  Diagnosis Date   (HFpEF) heart failure with preserved ejection fraction (HCC)    a. 01/2021 Echo: EF 55-60%, no rwma, GrII DD, nl RV fxn, mod elev PASP. Mild MR, mild-mod TR.   Abnormal mammogram, unspecified 2011   Arthritis 2002   Chronic pain syndrome 08/01/2017   GERD (gastroesophageal reflux disease)    Heart murmur 1985   Hyperlipidemia    Hypertension 2005   Hypothyroidism    Persistent atrial fibrillation/Atypical atrial flutter (HCC)    a. 04/2019 TEE/DCCV; b. 05/2019 Recurrent Aflutter-->Amio-->DCCV; c. CHA2DS2VASc = 4-->Eliquis; d. 08/2021 recurrent Afib req DCCV.  Flecainide started; e. 09/2021 Flecainide d/c'd 2/2 brady - couldn't tol bb.   PSVT (paroxysmal supraventricular tachycardia) (HCC)    a. 09/2021 Zio: Predominantly sinus rhythm (67; range 49-102).  Rare PACs with occasional PVCs (PVC burden 3.6%).  44 SVT runs-fastest 190, longest 12.9 seconds.  No sustained arrhythmias/prolonged pauses.  No significant arrhythmia identified corresponding to patient's symptoms.   Past Surgical History:  Procedure Laterality Date   APPENDECTOMY  1939   BREAST BIOPSY Left 02-24-14   benign   BREAST BIOPSY Left 02/11/2016   neg   BREAST BIOPSY Left 2013   neg   BREAST EXCISIONAL BIOPSY Left 2014   neg   BREAST SURGERY Left 2013   stereotactic biopsy    BREAST SURGERY  Left May 2010   1 mm foci of atypical ductal hyperplasia   CARDIOVERSION N/A 05/08/2019   Procedure: CARDIOVERSION;  Surgeon: Debbe Odea, MD;  Location: [facility];  Service: Cardiovascular;  Laterality: N/A;   CARDIOVERSION N/A 05/31/2019   Procedure: CARDIOVERSION;  Surgeon: Antonieta Iba, MD;  Location: [facility];  Service: Cardiovascular;  Laterality: N/A;   CARDIOVERSION N/A 08/25/2021   Procedure: CARDIOVERSION;  Surgeon: Debbe Odea, MD;  Location: [facility];  Service: Cardiovascular;  Laterality: N/A;   CARDIOVERSION N/A 12/29/2021   Procedure: CARDIOVERSION;  Surgeon: Debbe Odea, MD;  Location: [facility];  Service: Cardiovascular;  Laterality: N/A;   cataract surgery  Bilateral 2009   COLONOSCOPY  2010   Dr. Mechele Collin   left breast biopsy   2009   TEE WITHOUT CARDIOVERSION N/A 05/08/2019   Procedure: TRANSESOPHAGEAL ECHOCARDIOGRAM (TEE);  Surgeon: Debbe Odea, MD;  Location: [facility];  Service: Cardiovascular;  Laterality: N/A;     No outpatient medications have been marked as taking for the 12/31/21 encounter (Appointment) with Linea Calles, Cristal Deer, MD.    Allergies: Amoxicillin and Penicillins  Social History   Tobacco Use   Smoking status: Never   Smokeless tobacco: Never  Vaping Use   Vaping Use: Never used  Substance Use Topics   Alcohol use: No    Alcohol/week: 0.0 standard drinks   Drug use: No    Family History  Problem Relation Age of Onset   Heart disease Mother    Heart disease Father    Diabetes Father    Stroke Father    Arthritis Father     Review of Systems: A 12-system review of systems was performed and was negative  except as noted in the HPI.  --------------------------------------------------------------------------------------------------  Physical Exam: There were no vitals taken for this visit.  General:  NAD. Neck: No JVD or HJR. Lungs: Clear to auscultation bilaterally without wheezes or crackles. Heart:  Regular rate and rhythm without murmurs, rubs, or gallops. Abdomen: Soft, nontender, nondistended. Extremities: No lower extremity edema.  EKG:  ***  Lab Results  Component Value Date   WBC 6.9 12/30/2021   HGB 11.2 (L) 12/30/2021   HCT 34.0 (L) 12/30/2021   MCV 92.1 12/30/2021   PLT 232 12/30/2021    Lab Results  Component Value Date   NA 136 12/30/2021   K 3.8 12/30/2021   CL 104 12/30/2021   CO2 24 12/30/2021   BUN 20 12/30/2021   CREATININE 0.97 12/30/2021   GLUCOSE 130 (H) 12/30/2021   ALT 15 08/29/2021    No results found for: CHOL, HDL, LDLCALC, LDLDIRECT, TRIG, CHOLHDL  --------------------------------------------------------------------------------------------------  ASSESSMENT AND PLAN: ***  Yvonne Kendall, MD 12/31/2021 6:51 AM

## 2021-12-31 NOTE — Telephone Encounter (Signed)
Patient's niece call stating she spoke with her aunt and her aunt stated she was feeling well today.  Niece canceled today's appt with Dr. Saunders Revel.

## 2021-12-31 NOTE — Telephone Encounter (Signed)
Spoke with pt's niece, [name], this morning (DPR).  Pt discharged from hospital yesterday 5/18 s/p cardioversion.  Per [name], pt doing well.  Discussed appointment that was scheduled today with Dr. Okey Dupre. If pt feels she needs to be seen today we are happy to, however, to discuss plan moving forward will need to be decided by EP. Pt has follow up with Dr. Graciela Husbands 01/11/22.   [name] and pt both agreed they will cancel appointment today with Dr. Okey Dupre.  All questions were answered and confirmed instructions given at discharge.  Pt will follow up with Dr. Graciela Husbands as scheduled.   [name] appreciative of call and has no further questions at this time.   3 month recall placed with Dr. Okey Dupre.

## 2022-01-04 ENCOUNTER — Encounter: Payer: Self-pay | Admitting: Internal Medicine

## 2022-01-04 ENCOUNTER — Ambulatory Visit: Payer: Medicare PPO | Admitting: Internal Medicine

## 2022-01-04 VITALS — BP 130/60 | HR 62 | Ht 63.0 in | Wt 152.0 lb

## 2022-01-04 DIAGNOSIS — I480 Paroxysmal atrial fibrillation: Secondary | ICD-10-CM | POA: Diagnosis not present

## 2022-01-04 DIAGNOSIS — I5032 Chronic diastolic (congestive) heart failure: Secondary | ICD-10-CM | POA: Diagnosis not present

## 2022-01-04 DIAGNOSIS — I10 Essential (primary) hypertension: Secondary | ICD-10-CM

## 2022-01-04 DIAGNOSIS — I4892 Unspecified atrial flutter: Secondary | ICD-10-CM | POA: Diagnosis not present

## 2022-01-04 DIAGNOSIS — I493 Ventricular premature depolarization: Secondary | ICD-10-CM | POA: Diagnosis not present

## 2022-01-04 DIAGNOSIS — R001 Bradycardia, unspecified: Secondary | ICD-10-CM

## 2022-01-04 NOTE — H&P (View-Only) (Signed)
Patient Care Team: Lauro Regulus, MD as PCP - General (Internal Medicine) End, Cristal Deer, MD as PCP - Cardiology (Cardiology) Lemar Livings, Merrily Pew, MD (General Surgery) Barnabas Lister Carlisle Beers, MD (Unknown Physician Specialty)   HPI  Meagan Wilcox is a 88 y.o. female seen in follow-up for atrial fibrillation/flutter for which she was admitted 1/23.  History of bradycardia; started on flecainide and low-dose nadolol.  Underwent cardioversion with restoration of sinus rhythm but then issues associated with bradycardia and weakness.   discontinued the nadolol and tried an ISA beta-blocker. Because of ongoing bradycardia and fatigue, it was elected to try and go forward with neither.  She feels considerably better than she did in atrial fibrillation which upon ECG review was relatively rapid about 120.  Event recorder from 2/23 was personally reviewed demonstrating no atrial fibrillation; about 40 episodes of nonsustained atrial tachycardia lasting as long as 13 seconds with rates up to 190 or so.  Mean heart rate in sinus was 67 with a minimum of 49.  Recurrent tachycardia 4/23 associated with lightheadedness shortness of breath and chest discomfort; lasted about 8 hours and then the heart rate reverted into the 80s with some brief excursions into the 110 range.  By the time she showed up at the office the next day she was in sinus rhythm    Hospitalized 4/23 with recurrent tachypalpitations.  And underwent cardioversion.  Otherwise functionally stable  She comes with her niece, Meagan Wilcox  DATE TEST EF    9/20 Echo   55-60 % MR mild-mod  6/22 Echo   55-60 % DDysfunction Gd 2             Date Cr K Hgb  1/23 1.13 4.5  13.2   5/23 0.97 3.8 11.2   Thromboembolic risk factors ( age -35, HTN-1, Gender-1) for a CHADSVASc Score of >=4    Records and Results Reviewed   Past Medical History:  Diagnosis Date   (HFpEF) heart failure with preserved ejection fraction (HCC)    a.  01/2021 Echo: EF 55-60%, no rwma, GrII DD, nl RV fxn, mod elev PASP. Mild MR, mild-mod TR.   Abnormal mammogram, unspecified 2011   Arthritis 2002   Chronic pain syndrome 08/01/2017   GERD (gastroesophageal reflux disease)    Heart murmur 1985   Hyperlipidemia    Hypertension 2005   Hypothyroidism    Persistent atrial fibrillation/Atypical atrial flutter (HCC)    a. 04/2019 TEE/DCCV; b. 05/2019 Recurrent Aflutter-->Amio-->DCCV; c. CHA2DS2VASc = 4-->Eliquis; d. 08/2021 recurrent Afib req DCCV.  Flecainide started; e. 09/2021 Flecainide d/c'd 2/2 brady - couldn't tol bb.   PSVT (paroxysmal supraventricular tachycardia) (HCC)    a. 09/2021 Zio: Predominantly sinus rhythm (67; range 49-102).  Rare PACs with occasional PVCs (PVC burden 3.6%).  44 SVT runs-fastest 190, longest 12.9 seconds.  No sustained arrhythmias/prolonged pauses.  No significant arrhythmia identified corresponding to patient's symptoms.    Past Surgical History:  Procedure Laterality Date   APPENDECTOMY  1939   BREAST BIOPSY Left 02-24-14   benign   BREAST BIOPSY Left 02/11/2016   neg   BREAST BIOPSY Left 2013   neg   BREAST EXCISIONAL BIOPSY Left 2014   neg   BREAST SURGERY Left 2013   stereotactic biopsy    BREAST SURGERY Left May 2010   1 mm foci of atypical ductal hyperplasia   CARDIOVERSION N/A 05/08/2019   Procedure: CARDIOVERSION;  Surgeon: Debbe Odea, MD;  Location: [facility];  Service: Cardiovascular;  Laterality: N/A;   CARDIOVERSION N/A 05/31/2019   Procedure: CARDIOVERSION;  Surgeon: Antonieta Iba, MD;  Location: [facility];  Service: Cardiovascular;  Laterality: N/A;   CARDIOVERSION N/A 08/25/2021   Procedure: CARDIOVERSION;  Surgeon: Debbe Odea, MD;  Location: [facility];  Service: Cardiovascular;  Laterality: N/A;   CARDIOVERSION N/A 12/29/2021   Procedure: CARDIOVERSION;  Surgeon: Debbe Odea, MD;  Location: [facility];  Service: Cardiovascular;  Laterality: N/A;   cataract surgery   Bilateral 2009   COLONOSCOPY  2010   Dr. Mechele Collin   left breast biopsy   2009   TEE WITHOUT CARDIOVERSION N/A 05/08/2019   Procedure: TRANSESOPHAGEAL ECHOCARDIOGRAM (TEE);  Surgeon: Debbe Odea, MD;  Location: [facility];  Service: Cardiovascular;  Laterality: N/A;    Current Meds  Medication Sig   acetaminophen (TYLENOL) 325 MG tablet Take 325 mg by mouth every 6 (six) hours as needed for mild pain, fever, headache or moderate pain.   amitriptyline (ELAVIL) 25 MG tablet TAKE 1 TABLET BY MOUTH DAILY   BIOTIN PO Take 1 tablet by mouth daily.   Camphor-Menthol-Methyl Sal (SALONPAS) 3.08-20-08 % PTCH Place 1 patch onto the skin daily as needed (back pain).   cholestyramine (QUESTRAN) 4 g packet Take 1 packet by mouth 2 (two) times daily.   diltiazem (CARDIZEM CD) 120 MG 24 hr capsule Take 1 capsule (120 mg total) by mouth daily.   ELIQUIS 5 MG TABS tablet TAKE 1 TABLET(5 MG) BY MOUTH TWICE DAILY   furosemide (LASIX) 20 MG tablet Take 1 tablet (20 mg total) by mouth daily.   levothyroxine (SYNTHROID) 125 MCG tablet TAKE 1 TABLET(125 MCG) BY MOUTH DAILY   LORazepam (ATIVAN) 0.5 MG tablet Take 1 tablet by mouth every 12 (twelve) hours as needed for anxiety.   losartan-hydrochlorothiazide (HYZAAR) 50-12.5 MG tablet Take 1 tablet by mouth daily.   melatonin 5 MG TABS Take 1 tablet (5 mg total) by mouth at bedtime.   Multiple Vitamin (MULTIVITAMIN) capsule Take 1 capsule by mouth daily.   omeprazole (PRILOSEC) 20 MG capsule TAKE 1 CAPSULE BY MOUTH DAILY   pentosan polysulfate (ELMIRON) 100 MG capsule Take 100 mg by mouth 2 (two) times daily.   psyllium (METAMUCIL SMOOTH TEXTURE) 28 % packet Take 1 packet by mouth 2 (two) times daily as needed (Constipation).   simvastatin (ZOCOR) 20 MG tablet TAKE 1 TABLET BY MOUTH DAILY   verapamil (CALAN) 40 MG tablet Take 1 tablet (40 mg) by mouth every 4 hours up to 3 doses as needed for recurrent tachycardia    Allergies  Allergen Reactions   Amoxicillin  Diarrhea   Penicillins Rash    Did it involve swelling of the face/tongue/throat, SOB, or low BP? No Did it involve sudden or severe rash/hives, skin peeling, or any reaction on the inside of your mouth or nose? No Did you need to seek medical attention at a hospital or doctor's office? Yes When did it last happen?      65 years ago If all above answers are "NO", may proceed with cephalosporin use.      Review of Systems negative except from HPI and PMH  Physical Exam BP 130/60 (BP Location: Right Arm, Patient Position: Sitting, Cuff Size: Normal)   Pulse 62   Ht 5\' 3"  (1.6 m)   Wt 152 lb (68.9 kg)   SpO2 96%   BMI 26.93 kg/m  Well developed and nourished in no acute distress HENT normal Neck supple with JVP- Clear  Regular rate and rhythm, no murmurs or gallops Abd-soft with active BS No Clubbing cyanosis edema Skin-warm and dry A & Oriented  Grossly normal sensory and motor function  ECG  sinus at 62 16/10/42 Left ventricular hypertrophy   Estimated Creatinine Clearance: 36.6 mL/min (by C-G formula based on SCr of 0.97 mg/dL).   Assessment and  Plan  Atrial flutter atypical 2:1 conduction   HFpEF   Bradycardia   HTN  PVCs  Interval atrial arrhythmia associated with rapid rate and significant symptoms requiring hospitalization and semiurgent cardioversion  This brings to ahead of the issue of ongoing strategies of rate control.  As previously, have discussed the role of rhythm versus rate control.  At this juncture she would like to proceed with 1 or the other as opposed to an expectant strategy.  The former at her age does not include PVI, and with her bradycardia, would be limited to dofetilide.  Her QTc is less than 440 and we have discussed the potential risks and benefits including the proarrhythmic issues and the need for hospitalization.  The rate control strategy would require backup bradycardia pacing given her propensity towards bradycardia and then rate  control using beta-blockers or calcium blockers or potentially something like amiodarone if rate control in and of itself were inadequate.  Thankfully her niece was here who understands these issues very clearly and will help her process this and they will let us know in the next week or so what they would like to do.    No bleeding we will continue her on the Eliquis at 2.5 mg daily.  And we would stop this in anticipation of procedure and hold it for a few days thereafter  Blood pressure is well controlled, will continue on losartan 100/12.5      Current medicines are reviewed at length with the patient today .  The patient does not  have concerns regarding medicines.

## 2022-01-04 NOTE — Patient Instructions (Signed)
Medication Instructions:  - Your physician recommends that you continue on your current medications as directed. Please refer to the Current Medication list given to you today.  *If you need a refill on your cardiac medications before your next appointment, please call your pharmacy*   Lab Work: - none ordered  If you have labs (blood work) drawn today and your tests are completely normal, you will receive your results only by: MyChart Message (if you have MyChart) OR A paper copy in the mail If you have any lab test that is abnormal or we need to change your treatment, we will call you to review the results.   Testing/Procedures: - When you are ready, please call the office at [phone]/ send a MyChart message  Tikosyn admission (3 day hospital stay) Or Pacemaker Implant (at [facility])   Follow-Up: At [facility], you and your health needs are our priority.  As part of our continuing mission to provide you with exceptional heart care, we have created designated Provider Care Teams.  These Care Teams include your primary Cardiologist (physician) and Advanced Practice Providers (APPs -  Physician Assistants and Nurse Practitioners) who all work together to provide you with the care you need, when you need it.  We recommend signing up for the patient portal called "MyChart".  Sign up information is provided on this After Visit Summary.  MyChart is used to connect with patients for Virtual Visits (Telemedicine).  Patients are able to view lab/test results, encounter notes, upcoming appointments, etc.  Non-urgent messages can be sent to your provider as well.   To learn more about what you can do with MyChart, go to [email or web address].    Your next appointment:   Pending Tikosyn Admission vs a Pacemaker Implant   The format for your next appointment:   In Person  Provider:   Sherryl Manges, MD    Other Instructions N/a  Important Information About  [employer]

## 2022-01-04 NOTE — Progress Notes (Signed)
Patient Care Team: Lauro Regulus, MD as PCP - General (Internal Medicine) End, Cristal Deer, MD as PCP - Cardiology (Cardiology) Lemar Livings, Merrily Pew, MD (General Surgery) Barnabas Lister Carlisle Beers, MD (Unknown Physician Specialty)   HPI  Meagan Wilcox is a 88 y.o. female seen in follow-up for atrial fibrillation/flutter for which she was admitted 1/23.  History of bradycardia; started on flecainide and low-dose nadolol.  Underwent cardioversion with restoration of sinus rhythm but then issues associated with bradycardia and weakness.   discontinued the nadolol and tried an ISA beta-blocker. Because of ongoing bradycardia and fatigue, it was elected to try and go forward with neither.  She feels considerably better than she did in atrial fibrillation which upon ECG review was relatively rapid about 120.  Event recorder from 2/23 was personally reviewed demonstrating no atrial fibrillation; about 40 episodes of nonsustained atrial tachycardia lasting as long as 13 seconds with rates up to 190 or so.  Mean heart rate in sinus was 67 with a minimum of 49.  Recurrent tachycardia 4/23 associated with lightheadedness shortness of breath and chest discomfort; lasted about 8 hours and then the heart rate reverted into the 80s with some brief excursions into the 110 range.  By the time she showed up at the office the next day she was in sinus rhythm    Hospitalized 4/23 with recurrent tachypalpitations.  And underwent cardioversion.  Otherwise functionally stable  She comes with her niece, Meagan Wilcox  DATE TEST EF    9/20 Echo   55-60 % MR mild-mod  6/22 Echo   55-60 % DDysfunction Gd 2             Date Cr K Hgb  1/23 1.13 4.5  13.2   5/23 0.97 3.8 11.2   Thromboembolic risk factors ( age -35, HTN-1, Gender-1) for a CHADSVASc Score of >=4    Records and Results Reviewed   Past Medical History:  Diagnosis Date   (HFpEF) heart failure with preserved ejection fraction (HCC)    a.  01/2021 Echo: EF 55-60%, no rwma, GrII DD, nl RV fxn, mod elev PASP. Mild MR, mild-mod TR.   Abnormal mammogram, unspecified 2011   Arthritis 2002   Chronic pain syndrome 08/01/2017   GERD (gastroesophageal reflux disease)    Heart murmur 1985   Hyperlipidemia    Hypertension 2005   Hypothyroidism    Persistent atrial fibrillation/Atypical atrial flutter (HCC)    a. 04/2019 TEE/DCCV; b. 05/2019 Recurrent Aflutter-->Amio-->DCCV; c. CHA2DS2VASc = 4-->Eliquis; d. 08/2021 recurrent Afib req DCCV.  Flecainide started; e. 09/2021 Flecainide d/c'd 2/2 brady - couldn't tol bb.   PSVT (paroxysmal supraventricular tachycardia) (HCC)    a. 09/2021 Zio: Predominantly sinus rhythm (67; range 49-102).  Rare PACs with occasional PVCs (PVC burden 3.6%).  44 SVT runs-fastest 190, longest 12.9 seconds.  No sustained arrhythmias/prolonged pauses.  No significant arrhythmia identified corresponding to patient's symptoms.    Past Surgical History:  Procedure Laterality Date   APPENDECTOMY  1939   BREAST BIOPSY Left 02-24-14   benign   BREAST BIOPSY Left 02/11/2016   neg   BREAST BIOPSY Left 2013   neg   BREAST EXCISIONAL BIOPSY Left 2014   neg   BREAST SURGERY Left 2013   stereotactic biopsy    BREAST SURGERY Left May 2010   1 mm foci of atypical ductal hyperplasia   CARDIOVERSION N/A 05/08/2019   Procedure: CARDIOVERSION;  Surgeon: Debbe Odea, MD;  Location: [facility];  Service: Cardiovascular;  Laterality: N/A;   CARDIOVERSION N/A 05/31/2019   Procedure: CARDIOVERSION;  Surgeon: Antonieta Iba, MD;  Location: [facility];  Service: Cardiovascular;  Laterality: N/A;   CARDIOVERSION N/A 08/25/2021   Procedure: CARDIOVERSION;  Surgeon: Debbe Odea, MD;  Location: [facility];  Service: Cardiovascular;  Laterality: N/A;   CARDIOVERSION N/A 12/29/2021   Procedure: CARDIOVERSION;  Surgeon: Debbe Odea, MD;  Location: [facility];  Service: Cardiovascular;  Laterality: N/A;   cataract surgery   Bilateral 2009   COLONOSCOPY  2010   Dr. Mechele Collin   left breast biopsy   2009   TEE WITHOUT CARDIOVERSION N/A 05/08/2019   Procedure: TRANSESOPHAGEAL ECHOCARDIOGRAM (TEE);  Surgeon: Debbe Odea, MD;  Location: [facility];  Service: Cardiovascular;  Laterality: N/A;    Current Meds  Medication Sig   acetaminophen (TYLENOL) 325 MG tablet Take 325 mg by mouth every 6 (six) hours as needed for mild pain, fever, headache or moderate pain.   amitriptyline (ELAVIL) 25 MG tablet TAKE 1 TABLET BY MOUTH DAILY   BIOTIN PO Take 1 tablet by mouth daily.   Camphor-Menthol-Methyl Sal (SALONPAS) 3.08-20-08 % PTCH Place 1 patch onto the skin daily as needed (back pain).   cholestyramine (QUESTRAN) 4 g packet Take 1 packet by mouth 2 (two) times daily.   diltiazem (CARDIZEM CD) 120 MG 24 hr capsule Take 1 capsule (120 mg total) by mouth daily.   ELIQUIS 5 MG TABS tablet TAKE 1 TABLET(5 MG) BY MOUTH TWICE DAILY   furosemide (LASIX) 20 MG tablet Take 1 tablet (20 mg total) by mouth daily.   levothyroxine (SYNTHROID) 125 MCG tablet TAKE 1 TABLET(125 MCG) BY MOUTH DAILY   LORazepam (ATIVAN) 0.5 MG tablet Take 1 tablet by mouth every 12 (twelve) hours as needed for anxiety.   losartan-hydrochlorothiazide (HYZAAR) 50-12.5 MG tablet Take 1 tablet by mouth daily.   melatonin 5 MG TABS Take 1 tablet (5 mg total) by mouth at bedtime.   Multiple Vitamin (MULTIVITAMIN) capsule Take 1 capsule by mouth daily.   omeprazole (PRILOSEC) 20 MG capsule TAKE 1 CAPSULE BY MOUTH DAILY   pentosan polysulfate (ELMIRON) 100 MG capsule Take 100 mg by mouth 2 (two) times daily.   psyllium (METAMUCIL SMOOTH TEXTURE) 28 % packet Take 1 packet by mouth 2 (two) times daily as needed (Constipation).   simvastatin (ZOCOR) 20 MG tablet TAKE 1 TABLET BY MOUTH DAILY   verapamil (CALAN) 40 MG tablet Take 1 tablet (40 mg) by mouth every 4 hours up to 3 doses as needed for recurrent tachycardia    Allergies  Allergen Reactions   Amoxicillin  Diarrhea   Penicillins Rash    Did it involve swelling of the face/tongue/throat, SOB, or low BP? No Did it involve sudden or severe rash/hives, skin peeling, or any reaction on the inside of your mouth or nose? No Did you need to seek medical attention at a hospital or doctor's office? Yes When did it last happen?      65 years ago If all above answers are "NO", may proceed with cephalosporin use.      Review of Systems negative except from HPI and PMH  Physical Exam BP 130/60 (BP Location: Right Arm, Patient Position: Sitting, Cuff Size: Normal)   Pulse 62   Ht 5\' 3"  (1.6 m)   Wt 152 lb (68.9 kg)   SpO2 96%   BMI 26.93 kg/m  Well developed and nourished in no acute distress HENT normal Neck supple with JVP- Clear  Regular rate and rhythm, no murmurs or gallops Abd-soft with active BS No Clubbing cyanosis edema Skin-warm and dry A & Oriented  Grossly normal sensory and motor function  ECG  sinus at 62 16/10/42 Left ventricular hypertrophy   Estimated Creatinine Clearance: 36.6 mL/min (by C-G formula based on SCr of 0.97 mg/dL).   Assessment and  Plan  Atrial flutter atypical 2:1 conduction   HFpEF   Bradycardia   HTN  PVCs  Interval atrial arrhythmia associated with rapid rate and significant symptoms requiring hospitalization and semiurgent cardioversion  This brings to ahead of the issue of ongoing strategies of rate control.  As previously, have discussed the role of rhythm versus rate control.  At this juncture she would like to proceed with 1 or the other as opposed to an expectant strategy.  The former at her age does not include PVI, and with her bradycardia, would be limited to dofetilide.  Her QTc is less than 440 and we have discussed the potential risks and benefits including the proarrhythmic issues and the need for hospitalization.  The rate control strategy would require backup bradycardia pacing given her propensity towards bradycardia and then rate  control using beta-blockers or calcium blockers or potentially something like amiodarone if rate control in and of itself were inadequate.  Thankfully her niece was here who understands these issues very clearly and will help her process this and they will let us know in the next week or so what they would like to do.    No bleeding we will continue her on the Eliquis at 2.5 mg daily.  And we would stop this in anticipation of procedure and hold it for a few days thereafter  Blood pressure is well controlled, will continue on losartan 100/12.5      Current medicines are reviewed at length with the patient today .  The patient does not  have concerns regarding medicines.  

## 2022-01-06 ENCOUNTER — Telehealth: Payer: Self-pay | Admitting: Internal Medicine

## 2022-01-06 DIAGNOSIS — I480 Paroxysmal atrial fibrillation: Secondary | ICD-10-CM

## 2022-01-06 DIAGNOSIS — Z01812 Encounter for preprocedural laboratory examination: Secondary | ICD-10-CM

## 2022-01-06 NOTE — Telephone Encounter (Signed)
Patient's niece is requesting to speak with Healther, RN, regarding scheduling a procedure with Dr. Graciela Husbands as discussed. She states out of the few options discussed she is leaning towards having a PPM implanted. She would like to know whether it will also serve as an ICD and how long the battery life of the PPM will be. Please return call to discuss as able.

## 2022-01-07 NOTE — Telephone Encounter (Signed)
I called and spoke with the patient's niece, Meagan Wilcox (ok per DPR). Per Meagan Wilcox, the patient has decided to proceed with an implantation of a PPM.  They did have questions as to whether this would also be an ICD.  I advised Meagan Wilcox that the patient's device would only be a PPM as she currently has no indication for an ICD to be implanted. We discussed the approximate battery life on the PPM and how this can vary depending on how much the device is used.  We also discussed that the PPM is used to treat the patient's slow heart rates so Dr. Graciela Husbands can manage her a-fib/ rate control with medications.  Clarified with Meagan Wilcox that the PPM does not keep the patient from going into a-fib.  Meagan Wilcox voices understanding of all of the above and is agreeable.  I have confirmed with her that the patient's implant would be a MCH per Dr. Graciela Husbands.  She is aware I will call her back on Tuesday with dates/ times for the implant as I am scheduled to leave the office early today.   Meagan Wilcox again voices understanding and was appreciative of the call back.

## 2022-01-11 ENCOUNTER — Ambulatory Visit: Payer: Medicare PPO | Admitting: Internal Medicine

## 2022-01-11 NOTE — Telephone Encounter (Signed)
Reviewed with Dr. Caryl Comes in clinic today that the patient is wanting to move forward with a PPM implant.  Per Dr. Caryl Comes- looking at the schedule, he would like to do this on Wednesday 01/26/22 at [facility].  I have called [name], who was with the patient at the time, and advised her of the above.   The patient is agreeable with this date. They are aware I will call the hospital to confirm a time and then I will call back Thursday once I can clarify with Dr. Caryl Comes if he wants to hold eliquis the night before and morning of her procedure.    I have asked [name] to review if the patient can still access her MyChart as I can also send a written copy of instructions to them after I call and go over these on Thursday.  [name] will work on making sure the patient has MyChart access. They are agreeable with a call back on Thursday for final details of the procedure.

## 2022-01-14 ENCOUNTER — Encounter: Payer: Self-pay | Admitting: *Deleted

## 2022-01-14 NOTE — Telephone Encounter (Signed)
Attempted to call [name] in regards to the patient PPM implant instructions.  No answer- I left a detailed message for [name] that I am sending a copy of the detailed instructions to the patient's MyChart.  I advised she will need updated lab work due to her Hbg being a little low at her last draw 2 weeks ago. Her Hgb dropped 2 grams in 48 hours.   I have also advised the she will hold Eliquis on 6/13 and the am of 6/14.  I asked her to please call me back with any further questions/ concerns, or hopefully they were able to gain MyChart access and we can communicate that way as well.

## 2022-01-17 ENCOUNTER — Telehealth: Payer: Self-pay | Admitting: Internal Medicine

## 2022-01-17 NOTE — Telephone Encounter (Signed)
Attempted to call Meagan Wilcox back. No answer- I left a detailed message stating the patient does not need to fast for her pre procedure lab work. I advised if any other questions/ concerns to please call me back or feel free to send a MyChart message with her questions.

## 2022-01-17 NOTE — Telephone Encounter (Signed)
Patient's daughter called wanted to check to see if patient needs to fast for blood work or not for procedure. Please call back to check

## 2022-01-18 ENCOUNTER — Other Ambulatory Visit
Admission: RE | Admit: 2022-01-18 | Discharge: 2022-01-18 | Disposition: A | Discharge status: Home / Self Care | Payer: Medicare PPO | Attending: Internal Medicine | Admitting: Internal Medicine

## 2022-01-18 DIAGNOSIS — I480 Paroxysmal atrial fibrillation: Secondary | ICD-10-CM | POA: Diagnosis present

## 2022-01-18 DIAGNOSIS — Z01812 Encounter for preprocedural laboratory examination: Secondary | ICD-10-CM | POA: Diagnosis present

## 2022-01-18 LAB — BASIC METABOLIC PANEL
Anion gap: 4 — ABNORMAL LOW (ref 5–15)
BUN: 17 mg/dL (ref 8–23)
CO2: 26 mmol/L (ref 22–32)
Calcium: 9.2 mg/dL (ref 8.9–10.3)
Chloride: 104 mmol/L (ref 98–111)
Creatinine, Ser: 0.84 mg/dL (ref 0.44–1.00)
GFR, Estimated: >60 mL/min (ref >60)
Glucose, Bld: 107 mg/dL — ABNORMAL HIGH (ref 70–99)
Potassium: 4 mmol/L (ref 3.5–5.1)
Sodium: 134 mmol/L — ABNORMAL LOW (ref 135–145)

## 2022-01-18 LAB — CBC
HCT: 36.2 % (ref 36.0–46.0)
Hemoglobin: 11.9 g/dL — ABNORMAL LOW (ref 12.0–15.0)
MCH: 29.8 pg (ref 26.0–34.0)
MCHC: 32.9 g/dL (ref 30.0–36.0)
MCV: 90.7 fL (ref 80.0–100.0)
Platelets: 213 10*3/uL (ref 150–400)
RBC: 3.99 MIL/uL (ref 3.87–5.11)
RDW: 13.9 % (ref 11.5–15.5)
WBC: 6.9 10*3/uL (ref 4.0–10.5)
nRBC: 0 % (ref 0.0–0.2)

## 2022-01-18 NOTE — Telephone Encounter (Signed)
Patient reviewed my message in MyChart.

## 2022-01-23 ENCOUNTER — Other Ambulatory Visit: Payer: Self-pay | Admitting: Internal Medicine

## 2022-01-25 NOTE — Pre-Procedure Instructions (Signed)
Instructed patient on the following items: Arrival time 1030 Nothing to eat or drink after midnight No meds AM of procedure Responsible person to drive you home and stay with you for 24 hrs Wash with special soap night before and morning of procedure If on anti-coagulant drug instructions Eliquis- last dose Monday 6/12

## 2022-01-26 ENCOUNTER — Ambulatory Visit (HOSPITAL_COMMUNITY): Payer: Medicare PPO

## 2022-01-26 ENCOUNTER — Ambulatory Visit: Payer: Medicare PPO | Admitting: Family Medicine

## 2022-01-26 ENCOUNTER — Other Ambulatory Visit: Payer: Self-pay

## 2022-01-26 ENCOUNTER — Ambulatory Visit (HOSPITAL_COMMUNITY)
Admission: RE | Admit: 2022-01-26 | Discharge: 2022-01-26 | Disposition: A | Discharge status: Home / Self Care | Payer: Medicare PPO | Attending: Internal Medicine | Admitting: Internal Medicine

## 2022-01-26 ENCOUNTER — Encounter (HOSPITAL_COMMUNITY)
Admission: RE | Disposition: A | Discharge status: Home / Self Care | Payer: Self-pay | Source: Home / Self Care | Attending: Internal Medicine

## 2022-01-26 DIAGNOSIS — I495 Sick sinus syndrome: Secondary | ICD-10-CM

## 2022-01-26 DIAGNOSIS — R001 Bradycardia, unspecified: Secondary | ICD-10-CM

## 2022-01-26 DIAGNOSIS — Z7901 Long term (current) use of anticoagulants: Secondary | ICD-10-CM | POA: Diagnosis not present

## 2022-01-26 DIAGNOSIS — I5032 Chronic diastolic (congestive) heart failure: Secondary | ICD-10-CM | POA: Insufficient documentation

## 2022-01-26 DIAGNOSIS — I4892 Unspecified atrial flutter: Secondary | ICD-10-CM | POA: Insufficient documentation

## 2022-01-26 DIAGNOSIS — I4819 Other persistent atrial fibrillation: Secondary | ICD-10-CM | POA: Insufficient documentation

## 2022-01-26 DIAGNOSIS — Z79899 Other long term (current) drug therapy: Secondary | ICD-10-CM | POA: Insufficient documentation

## 2022-01-26 DIAGNOSIS — I110 Hypertensive heart disease with heart failure: Secondary | ICD-10-CM | POA: Insufficient documentation

## 2022-01-26 HISTORY — PX: PACEMAKER IMPLANT: EP1218

## 2022-01-26 SURGERY — PACEMAKER IMPLANT

## 2022-01-26 MED ORDER — SODIUM CHLORIDE 0.9 % IV SOLN
INTRAVENOUS | Status: AC
  Filled 2022-01-26: qty 2

## 2022-01-26 MED ORDER — LIDOCAINE HCL (PF) 1 % IJ SOLN
INTRAMUSCULAR | Status: DC | PRN
  Administered 2022-01-26: 60 mL

## 2022-01-26 MED ORDER — HEPARIN (PORCINE) IN NACL 1000-0.9 UT/500ML-% IV SOLN
INTRAVENOUS | Status: DC | PRN
  Administered 2022-01-26: 500 mL

## 2022-01-26 MED ORDER — SODIUM CHLORIDE 0.9 % IV SOLN: INTRAVENOUS | Status: DC

## 2022-01-26 MED ORDER — SODIUM CHLORIDE 0.9 % IV SOLN
80.0000 mg | INTRAVENOUS | Status: AC
  Administered 2022-01-26: 80 mg

## 2022-01-26 MED ORDER — DILTIAZEM HCL ER COATED BEADS 120 MG PO CP24: 120.0000 mg | ORAL_CAPSULE | Freq: Every day | ORAL | 6 refills | Status: DC

## 2022-01-26 MED ORDER — ACETAMINOPHEN 325 MG PO TABS
325.0000 mg | ORAL_TABLET | ORAL | Status: DC | PRN
  Administered 2022-01-26: 650 mg via ORAL
  Filled 2022-01-26: qty 2

## 2022-01-26 MED ORDER — CHLORHEXIDINE GLUCONATE 4 % EX LIQD: 4.0000 "application " | Freq: Once | CUTANEOUS | Status: DC

## 2022-01-26 MED ORDER — LIDOCAINE HCL (PF) 1 % IJ SOLN
INTRAMUSCULAR | Status: AC
  Filled 2022-01-26: qty 60

## 2022-01-26 MED ORDER — ONDANSETRON HCL 4 MG/2ML IJ SOLN: 4.0000 mg | Freq: Four times a day (QID) | INTRAMUSCULAR | Status: DC | PRN

## 2022-01-26 MED ORDER — POVIDONE-IODINE 10 % EX SWAB: 2.0000 "application " | Freq: Once | CUTANEOUS | Status: DC

## 2022-01-26 MED ORDER — VANCOMYCIN HCL IN DEXTROSE 1-5 GM/200ML-% IV SOLN
1000.0000 mg | INTRAVENOUS | Status: AC
  Administered 2022-01-26: 1000 mg via INTRAVENOUS

## 2022-01-26 MED ORDER — VANCOMYCIN HCL IN DEXTROSE 1-5 GM/200ML-% IV SOLN
INTRAVENOUS | Status: AC
  Filled 2022-01-26: qty 200

## 2022-01-26 MED ORDER — HEPARIN (PORCINE) IN NACL 1000-0.9 UT/500ML-% IV SOLN
INTRAVENOUS | Status: AC
  Filled 2022-01-26: qty 500

## 2022-01-26 SURGICAL SUPPLY — 10 items
CABLE SURGICAL S-101-97-12 (CABLE) ×2 IMPLANT
KIT MICROPUNCTURE NIT STIFF (SHEATH) ×1 IMPLANT
LEAD ISOFLEX OPT 1944-46CM (Lead) ×1 IMPLANT
LEAD ISOFLEX OPT 1948-58CM (Lead) ×1 IMPLANT
PACEMAKER ASSURITY DR-RF (Pacemaker) ×1 IMPLANT
PAD DEFIB RADIO PHYSIO CONN (PAD) ×2 IMPLANT
SHEATH 7FR PRELUDE SNAP 13 (SHEATH) ×2 IMPLANT
SHEATH 8FR PRELUDE SNAP 13 (SHEATH) IMPLANT
TRAY PACEMAKER INSERTION (PACKS) ×2 IMPLANT
WIRE MICRO SET 5FR 12 (WIRE) IMPLANT

## 2022-01-26 NOTE — Interval H&P Note (Signed)
History and Physical Interval Note:  01/26/2022 11:04 AM  Meagan Wilcox  has presented today for surgery, with the diagnosis of sinus node dysfunction, afib.  The various methods of treatment have been discussed with the patient and family. After consideration of risks, benefits and other options for treatment, the patient has consented to  Procedure(s): PACEMAKER IMPLANT (N/A) as a surgical intervention.  The patient's history has been reviewed, patient examined, no change in status, stable for surgery.  I have reviewed the patient's chart and labs.  Questions were answered to the patient's satisfaction.     Virl Axe  The benefits and risks were reviewed including but not limited to death,  perforation, infection, lead dislodgement and device malfunction.  The patient understands agrees and is willing to proceed.

## 2022-01-26 NOTE — Progress Notes (Signed)
Dr Klein notified of post CXR-orders to d/c pt 

## 2022-01-26 NOTE — Discharge Instructions (Signed)
After Your Pacemaker   You have a St. Jude Pacemaker  ACTIVITY Do not lift your arm above shoulder height for 1 week after your procedure. After 7 days, you may progress as below.  You should remove your sling 24 hours after your procedure, unless otherwise instructed by your provider.     Wednesday February 02, 2022  Thursday February 03, 2022 Friday February 04, 2022 Saturday February 05, 2022   Do not lift, push, pull, or carry anything over 10 pounds with the affected arm until 6 weeks (Wednesday March 09, 2022 ) after your procedure.   You may drive AFTER your wound check, unless you have been told otherwise by your provider.   Ask your healthcare provider when you can go back to work   INCISION/Dressing If you are on a blood thinner such as Coumadin, Xarelto, Eliquis, Plavix, or Pradaxa please confirm with your provider when this should be resumed.   If large square, outer bandage is left in place, this can be removed after 24 hours from your procedure. Do not remove steri-strips or glue as below.   Monitor your Pacemaker site for redness, swelling, and drainage. Call the device clinic at [phone] if you experience these symptoms or fever/chills.  If your incision is sealed with Steri-strips or staples, you may shower 7 days after your procedure or when told by your provider. Do not remove the steri-strips or let the shower hit directly on your site. You may wash around your site with soap and water.    If you were discharged in a sling, please do not wear this during the day more than 48 hours after your surgery unless otherwise instructed. This may increase the risk of stiffness and soreness in your shoulder.   Avoid lotions, ointments, or perfumes over your incision until it is well-healed.  You may use a hot tub or a pool AFTER your wound check appointment if the incision is completely closed.  Pacemaker Alerts:  Some alerts are vibratory and others beep. These are NOT emergencies.  Please call our office to let us know. If this occurs at night or on weekends, it can wait until the next business day. Send a remote transmission.  If your device is capable of reading fluid status (for heart failure), you will be offered monthly monitoring to review this with you.   DEVICE MANAGEMENT Remote monitoring is used to monitor your pacemaker from home. This monitoring is scheduled every 91 days by our office. It allows [place] to keep an eye on the functioning of your device to ensure it is working properly. You will routinely see your Electrophysiologist annually (more often if necessary).   You should receive your ID card for your new device in 4-8 weeks. Keep this card with you at all times once received. Consider wearing a medical alert bracelet or necklace.  Your Pacemaker may be MRI compatible. This will be discussed at your next office visit/wound check.  You should avoid contact with strong electric or magnetic fields.   Do not use amateur (ham) radio equipment or electric (arc) welding torches. MP3 player headphones with magnets should not be used. Some devices are safe to use if held at least 12 inches (30 cm) from your Pacemaker. These include power tools, lawn mowers, and speakers. If you are unsure if something is safe to use, ask your health care provider.  When using your cell phone, hold it to the ear that is on the opposite side from  the Pacemaker. Do not leave your cell phone in a pocket over the Pacemaker.  You may safely use electric blankets, heating pads, computers, and microwave ovens.  Call the office right away if: You have chest pain. You feel more short of breath than you have felt before. You feel more light-headed than you have felt before. Your incision starts to open up.  This information is not intended to replace advice given to you by your health care provider. Make sure you discuss any questions you have with your health care provider.

## 2022-01-27 ENCOUNTER — Telehealth: Payer: Self-pay

## 2022-01-27 ENCOUNTER — Encounter (HOSPITAL_COMMUNITY): Payer: Self-pay | Admitting: Internal Medicine

## 2022-01-27 NOTE — Telephone Encounter (Signed)
Follow-up after same day discharge: Implant date: 01/26/22 MD: Sherryl Manges, MD Device: PPM  Location: [facility] dual chamber PPM   Wound check visit: 02/09/22 2:40 90 day MD follow-up: 05/10/22 10:40  Remote Transmission received:no  Dressing/sling removed: yes  Confirm OAC restart on: Sunday 01/30/22  Successful telephone encounter to patient. States she is doing well. Appointments confirmed as above. Patient taking tylenol as recommended for soreness. Remote monitor plugged in at bedside. Device clinic contact provided to patient for additional questions or concerns that may arise.

## 2022-01-27 NOTE — Telephone Encounter (Signed)
-----   Message from Graciella Freer, PA-C sent at 01/26/2022  4:07 PM EDT ----- Same day PPM, SK 6/14

## 2022-02-09 ENCOUNTER — Ambulatory Visit: Payer: Medicare PPO

## 2022-02-09 DIAGNOSIS — R001 Bradycardia, unspecified: Secondary | ICD-10-CM

## 2022-02-09 NOTE — Patient Instructions (Signed)
   After Your Pacemaker   Monitor your pacemaker site for redness, swelling, and drainage. Call the device clinic at [phone] if you experience these symptoms or fever/chills.  Your incision was closed with Steri-strips or staples:  You may shower 7 days after your procedure and wash your incision with soap and water. Avoid lotions, ointments, or perfumes over your incision until it is well-healed.  You may use a hot tub or a pool after your wound check appointment if the incision is completely closed.  Do not lift, push or pull greater than 10 pounds with the affected arm until 6 weeks after your procedure. There are no other restrictions in arm movement after your wound check appointment.  (Until July 26th)   You may drive, unless driving has been restricted by your healthcare providers.  Your Pacemaker is MRI compatible.  Remote monitoring is used to monitor your pacemaker from home. This monitoring is scheduled every 91 days by our office. It allows [place] to keep an eye on the functioning of your device to ensure it is working properly. You will routinely see your Electrophysiologist annually (more often if necessary).

## 2022-02-10 ENCOUNTER — Other Ambulatory Visit: Payer: Self-pay | Admitting: Internal Medicine

## 2022-02-10 LAB — CUP PACEART INCLINIC DEVICE CHECK
Battery Remaining Longevity: 84 mo
Battery Voltage: 3.07 V
Brady Statistic RA Percent Paced: 89 %
Brady Statistic RV Percent Paced: 0.43 %
Date Time Interrogation Session: 20230628150400
Implantable Lead Implant Date: 20230614
Implantable Lead Implant Date: 20230614
Implantable Lead Location: 753859
Implantable Lead Location: 753860
Implantable Lead Model: 1944
Implantable Lead Model: 1948
Implantable Pulse Generator Implant Date: 20230614
Lead Channel Impedance Value: 475 Ohm
Lead Channel Impedance Value: 650 Ohm
Lead Channel Pacing Threshold Amplitude: 0.75 V
Lead Channel Pacing Threshold Amplitude: 0.75 V
Lead Channel Pacing Threshold Pulse Width: 0.5 ms
Lead Channel Pacing Threshold Pulse Width: 0.5 ms
Lead Channel Sensing Intrinsic Amplitude: 4.2 mV
Lead Channel Sensing Intrinsic Amplitude: 9.6 mV
Lead Channel Setting Pacing Amplitude: 1 V
Lead Channel Setting Pacing Amplitude: 3.5 V
Lead Channel Setting Pacing Pulse Width: 0.5 ms
Lead Channel Setting Sensing Sensitivity: 2 mV
Pulse Gen Model: 2272
Pulse Gen Serial Number: 8087558

## 2022-02-10 NOTE — Progress Notes (Signed)
Wound check appointment. Steri-strips removed. Wound without redness or edema. Incision edges approximated, wound well healed. Normal device function. Thresholds, sensing, and impedances consistent with implant measurements. Device programmed at 3.5V/auto capture programmed on for extra safety margin until 3 month visit. Histogram distribution appropriate for patient and level of activity. 1 episode of HVR 02/06/22 1:1 SVT, rate 184 lasting 17 min. History of AF/AFL with RVR. +OAC. Patient symptomatic with dizziness and palpitations. Self resolved. ED precautions given for episodes that do not resolve shortly after rest. Patient educated about wound care, arm mobility, lifting restrictions. Enrolled in remote monitoring. ROV in 3 months with implanting physician.

## 2022-02-11 NOTE — Telephone Encounter (Signed)
Hi. Could you please advise if med should be refilled? The prescription for this medication was most recently written by Dr. Lolita Patella in the hospital on 08/31/21. I haven't noticed any refills since. Thank you.

## 2022-03-17 ENCOUNTER — Other Ambulatory Visit: Payer: Self-pay | Admitting: Internal Medicine

## 2022-04-07 ENCOUNTER — Ambulatory Visit: Payer: Medicare PPO | Admitting: Internal Medicine

## 2022-04-19 ENCOUNTER — Telehealth: Payer: Self-pay

## 2022-04-19 NOTE — Telephone Encounter (Signed)
Outreach made to Pt.  She does remember this episode from Sunday.  She states she felt her heart racing and laid down to rest.  She denies SOB or chest pain during episode.  Advised would discuss with Dr. Graciela Husbands and call back with his recommendation.  She was thankful for call, she wasn't sure if she should have called to report this.  Device alert for HVR 9/2, duration 10sec, mean HR 184 EGM show's regular 1:1 Route to triage for duration

## 2022-04-19 NOTE — Telephone Encounter (Signed)
Returned call to Pt.  Confirmed she takes diltiazem daily.  Confirmed Pt has a prescription for verapamil, but she states she has not taken it.  She states she wasn't sure if she should take it now that she has a pacemaker.  Advised ok to take verapamil for racing heart, and that the pacemaker protects her heart from going to slow.  Pt was grateful for call back.  She will call with any further concerns.

## 2022-04-27 ENCOUNTER — Ambulatory Visit (INDEPENDENT_AMBULATORY_CARE_PROVIDER_SITE_OTHER): Payer: Medicare PPO

## 2022-04-27 DIAGNOSIS — I4892 Unspecified atrial flutter: Secondary | ICD-10-CM | POA: Diagnosis not present

## 2022-04-28 LAB — CUP PACEART REMOTE DEVICE CHECK
Battery Remaining Longevity: 84 mo
Battery Remaining Percentage: 95.5 %
Battery Voltage: 3.02 V
Brady Statistic AP VP Percent: 1 %
Brady Statistic AP VS Percent: 85 %
Brady Statistic AS VP Percent: 1 %
Brady Statistic AS VS Percent: 15 %
Brady Statistic RA Percent Paced: 85 %
Brady Statistic RV Percent Paced: 1 %
Date Time Interrogation Session: 20230913034634
Implantable Lead Implant Date: 20230614
Implantable Lead Implant Date: 20230614
Implantable Lead Location: 753859
Implantable Lead Location: 753860
Implantable Lead Model: 1944
Implantable Lead Model: 1948
Implantable Pulse Generator Implant Date: 20230614
Lead Channel Impedance Value: 530 Ohm
Lead Channel Impedance Value: 610 Ohm
Lead Channel Pacing Threshold Amplitude: 0.75 V
Lead Channel Pacing Threshold Amplitude: 0.75 V
Lead Channel Pacing Threshold Pulse Width: 0.5 ms
Lead Channel Pacing Threshold Pulse Width: 0.5 ms
Lead Channel Sensing Intrinsic Amplitude: 2.1 mV
Lead Channel Sensing Intrinsic Amplitude: 6 mV
Lead Channel Setting Pacing Amplitude: 1 V
Lead Channel Setting Pacing Amplitude: 3.5 V
Lead Channel Setting Pacing Pulse Width: 0.5 ms
Lead Channel Setting Sensing Sensitivity: 2 mV
Pulse Gen Model: 2272
Pulse Gen Serial Number: 8087558

## 2022-05-04 NOTE — Telephone Encounter (Signed)
Meagan Wilcox  did she take her prn  verapamil with her tachypalps and alos What is the Afib burden per her device Thanks SK

## 2022-05-10 ENCOUNTER — Encounter: Payer: Self-pay | Admitting: Internal Medicine

## 2022-05-10 ENCOUNTER — Ambulatory Visit: Payer: Medicare PPO | Attending: Internal Medicine | Admitting: Internal Medicine

## 2022-05-10 ENCOUNTER — Other Ambulatory Visit
Admission: RE | Admit: 2022-05-10 | Discharge: 2022-05-10 | Disposition: A | Discharge status: Home / Self Care | Payer: Medicare PPO | Source: Ambulatory Visit | Attending: Internal Medicine | Admitting: Internal Medicine

## 2022-05-10 VITALS — BP 140/60 | HR 65 | Ht 63.0 in | Wt 152.4 lb

## 2022-05-10 DIAGNOSIS — I493 Ventricular premature depolarization: Secondary | ICD-10-CM

## 2022-05-10 DIAGNOSIS — I480 Paroxysmal atrial fibrillation: Secondary | ICD-10-CM | POA: Diagnosis not present

## 2022-05-10 DIAGNOSIS — Z950 Presence of cardiac pacemaker: Secondary | ICD-10-CM | POA: Diagnosis not present

## 2022-05-10 DIAGNOSIS — R001 Bradycardia, unspecified: Secondary | ICD-10-CM | POA: Diagnosis not present

## 2022-05-10 DIAGNOSIS — I10 Essential (primary) hypertension: Secondary | ICD-10-CM | POA: Diagnosis not present

## 2022-05-10 DIAGNOSIS — R5383 Other fatigue: Secondary | ICD-10-CM | POA: Insufficient documentation

## 2022-05-10 LAB — CUP PACEART INCLINIC DEVICE CHECK
Battery Remaining Longevity: 122 mo
Battery Voltage: 3.02 V
Brady Statistic RA Percent Paced: 85 %
Brady Statistic RV Percent Paced: 0.38 %
Date Time Interrogation Session: 20230926104800
Implantable Lead Implant Date: 20230614
Implantable Lead Implant Date: 20230614
Implantable Lead Location: 753859
Implantable Lead Location: 753860
Implantable Lead Model: 1944
Implantable Lead Model: 1948
Implantable Pulse Generator Implant Date: 20230614
Lead Channel Impedance Value: 575 Ohm
Lead Channel Impedance Value: 662.5 Ohm
Lead Channel Pacing Threshold Amplitude: 0.5 V
Lead Channel Pacing Threshold Amplitude: 0.5 V
Lead Channel Pacing Threshold Amplitude: 0.75 V
Lead Channel Pacing Threshold Amplitude: 0.75 V
Lead Channel Pacing Threshold Pulse Width: 0.5 ms
Lead Channel Pacing Threshold Pulse Width: 0.5 ms
Lead Channel Pacing Threshold Pulse Width: 0.5 ms
Lead Channel Pacing Threshold Pulse Width: 0.5 ms
Lead Channel Sensing Intrinsic Amplitude: 4 mV
Lead Channel Sensing Intrinsic Amplitude: 8.1 mV
Lead Channel Setting Pacing Amplitude: 0.875
Lead Channel Setting Pacing Amplitude: 2 V
Lead Channel Setting Pacing Pulse Width: 0.5 ms
Lead Channel Setting Sensing Sensitivity: 2 mV
Pulse Gen Model: 2272
Pulse Gen Serial Number: 8087558

## 2022-05-10 LAB — CBC
HCT: 35.7 % — ABNORMAL LOW (ref 36.0–46.0)
Hemoglobin: 12.1 g/dL (ref 12.0–15.0)
MCH: 31.1 pg (ref 26.0–34.0)
MCHC: 33.9 g/dL (ref 30.0–36.0)
MCV: 91.8 fL (ref 80.0–100.0)
Platelets: 208 10*3/uL (ref 150–400)
RBC: 3.89 MIL/uL (ref 3.87–5.11)
RDW: 13.1 % (ref 11.5–15.5)
WBC: 7.5 10*3/uL (ref 4.0–10.5)
nRBC: 0 % (ref 0.0–0.2)

## 2022-05-10 NOTE — Progress Notes (Signed)
Patient Care Team: Lauro Regulus, MD as PCP - General (Internal Medicine) End, Cristal Deer, MD as PCP - Cardiology (Cardiology) Lemar Livings, Merrily Pew, MD (General Surgery) Darcey Nora, MD (Unknown Physician Specialty)   HPI  Meagan Wilcox is a 88 y.o. female seen in follow-up for atrial fibrillation/flutter intermittent bradycardia, rhythm control and rate control strategies having been largely of failure secondary to progressive bradycardia, concerns about aggravating bradycardia.  She even failed ISA beta-blockers.  With issues of tacky bradycardia and symptomatic bradycardia particularly, discussions were had regarding antiarrhythmic therapy probably limited dofetilide given her age and the bradycardia and backup bradycardia pacing with a rate control strategy.  She elected the latter and underwent pacing Abbott  for rate controlling strategy 6/23     Event recorder from 2/23 was personally reviewed demonstrating no atrial fibrillation; about 40 episodes of nonsustained atrial tachycardia lasting as long as 13 seconds with rates up to 190 or so.  Mean heart rate in sinus was 67 with a minimum of 49.  Recurrent tachycardia 4/23 associated with lightheadedness shortness of breath and chest discomfort; lasted about 8 hours and then the heart rate reverted into the 80s with some brief excursions into the 110 range.  By the time she showed up at the office the next day she was in sinus rhythm    Hospitalized 4/23 with recurrent tachypalpitations.  And underwent cardioversion.  Following pacemaker implantation has had no more of the very severe weakness spells that are largely immobilizing, she does continue to complain of fatigue.  Ambulation is limited by leg pain also.  No chest pain;  had palpitations which correlated with an atrial tachycardia.  Self-limited about 30 minutes.  Did not take her as needed verapamil Oh Heather DATE TEST EF    9/20 Echo   55-60 % MR  mild-mod  6/22 Echo   55-60 % DDysfunction Gd 2             Date Cr K Hgb  1/23 1.13 4.5  13.2   5/23 0.97 3.8 11.2   Thromboembolic risk factors ( age -61, HTN-1, Gender-1) for a CHADSVASc Score of >=4    Records and Results Reviewed   Past Medical History:  Diagnosis Date   (HFpEF) heart failure with preserved ejection fraction (HCC)    a. 01/2021 Echo: EF 55-60%, no rwma, GrII DD, nl RV fxn, mod elev PASP. Mild MR, mild-mod TR.   Abnormal mammogram, unspecified 2011   Arthritis 2002   Chronic pain syndrome 08/01/2017   GERD (gastroesophageal reflux disease)    Heart murmur 1985   Hyperlipidemia    Hypertension 2005   Hypothyroidism    Persistent atrial fibrillation/Atypical atrial flutter (HCC)    a. 04/2019 TEE/DCCV; b. 05/2019 Recurrent Aflutter-->Amio-->DCCV; c. CHA2DS2VASc = 4-->Eliquis; d. 08/2021 recurrent Afib req DCCV.  Flecainide started; e. 09/2021 Flecainide d/c'd 2/2 brady - couldn't tol bb.   PSVT (paroxysmal supraventricular tachycardia) (HCC)    a. 09/2021 Zio: Predominantly sinus rhythm (67; range 49-102).  Rare PACs with occasional PVCs (PVC burden 3.6%).  44 SVT runs-fastest 190, longest 12.9 seconds.  No sustained arrhythmias/prolonged pauses.  No significant arrhythmia identified corresponding to patient's symptoms.    Past Surgical History:  Procedure Laterality Date   APPENDECTOMY  1939   BREAST BIOPSY Left 02-24-14   benign   BREAST BIOPSY Left 02/11/2016   neg   BREAST BIOPSY Left 2013   neg   BREAST EXCISIONAL BIOPSY Left  2014   neg   BREAST SURGERY Left 2013   stereotactic biopsy    BREAST SURGERY Left May 2010   1 mm foci of atypical ductal hyperplasia   CARDIOVERSION N/A 05/08/2019   Procedure: CARDIOVERSION;  Surgeon: Kate Sable, MD;  Location: [facility];  Service: Cardiovascular;  Laterality: N/A;   CARDIOVERSION N/A 05/31/2019   Procedure: CARDIOVERSION;  Surgeon: Minna Merritts, MD;  Location: [facility];  Service: Cardiovascular;   Laterality: N/A;   CARDIOVERSION N/A 08/25/2021   Procedure: CARDIOVERSION;  Surgeon: Kate Sable, MD;  Location: [facility];  Service: Cardiovascular;  Laterality: N/A;   CARDIOVERSION N/A 12/29/2021   Procedure: CARDIOVERSION;  Surgeon: Kate Sable, MD;  Location: [facility];  Service: Cardiovascular;  Laterality: N/A;   cataract surgery  Bilateral 2009   COLONOSCOPY  2010   Dr. Vira Agar   left breast biopsy   2009   PACEMAKER IMPLANT N/A 01/26/2022   Procedure: PACEMAKER IMPLANT;  Surgeon: Deboraha Sprang, MD;  Location: [facility];  Service: Cardiovascular;  Laterality: N/A;   TEE WITHOUT CARDIOVERSION N/A 05/08/2019   Procedure: TRANSESOPHAGEAL ECHOCARDIOGRAM (TEE);  Surgeon: Kate Sable, MD;  Location: [facility];  Service: Cardiovascular;  Laterality: N/A;    Current Meds  Medication Sig   acetaminophen (TYLENOL) 325 MG tablet Take 325 mg by mouth every 6 (six) hours as needed for mild pain, fever, headache or moderate pain.   amitriptyline (ELAVIL) 25 MG tablet TAKE 1 TABLET BY MOUTH DAILY   BIOTIN PO Take 1 tablet by mouth daily.   Camphor-Menthol-Methyl Sal (SALONPAS) 3.08-20-08 % PTCH Place 1 patch onto the skin daily as needed (back pain).   diltiazem (CARDIZEM CD) 120 MG 24 hr capsule Take 1 capsule (120 mg total) by mouth daily.   ELIQUIS 5 MG TABS tablet TAKE 1 TABLET(5 MG) BY MOUTH TWICE DAILY   furosemide (LASIX) 20 MG tablet Take 1 tablet (20 mg total) by mouth daily.   levothyroxine (SYNTHROID) 125 MCG tablet TAKE 1 TABLET(125 MCG) BY MOUTH DAILY   LORazepam (ATIVAN) 0.5 MG tablet Take 1 tablet by mouth every 12 (twelve) hours as needed for anxiety.   losartan-hydrochlorothiazide (HYZAAR) 50-12.5 MG tablet Take 1 tablet by mouth daily.   melatonin 5 MG TABS Take 1 tablet (5 mg total) by mouth at bedtime.   Multiple Vitamin (MULTIVITAMIN) capsule Take 1 capsule by mouth daily.   omeprazole (PRILOSEC) 20 MG capsule TAKE 1 CAPSULE BY MOUTH DAILY    pentosan polysulfate (ELMIRON) 100 MG capsule Take 100 mg by mouth 2 (two) times daily.   psyllium (METAMUCIL SMOOTH TEXTURE) 28 % packet Take 1 packet by mouth daily at 8 pm.   simvastatin (ZOCOR) 20 MG tablet TAKE 1 TABLET BY MOUTH DAILY   verapamil (CALAN) 40 MG tablet Take 1 tablet (40 mg) by mouth every 4 hours up to 3 doses as needed for recurrent tachycardia    Allergies  Allergen Reactions   Amoxicillin Diarrhea   Penicillins Rash    Did it involve swelling of the face/tongue/throat, SOB, or low BP? No Did it involve sudden or severe rash/hives, skin peeling, or any reaction on the inside of your mouth or nose? No Did you need to seek medical attention at a hospital or doctor's office? Yes When did it last happen?      65 years ago If all above answers are "NO", may proceed with cephalosporin use.      Review of Systems negative except from HPI  and PMH  Physical Exam BP (!) 140/60 (BP Location: Left Arm, Patient Position: Sitting, Cuff Size: Normal)   Pulse 65   Ht 5\' 3"  (1.6 m)   Wt 152 lb 6 oz (69.1 kg)   SpO2 96%   BMI 26.99 kg/m  Well developed and well nourished in no acute distress HENT normal Neck supple with JVP-flat Clear Device pocket well healed; without hematoma or erythema.  There is no tethering  Regular rate and rhythm, no/ murmur Abd-soft with active BS No Clubbing cyanosis tr edema Skin-warm and dry A & Oriented  Grossly normal sensory and motor function  ECG atrial paced at 65 Intervals 18/10/37 Voltage criteria for LVH  CrCl cannot be calculated (Patient's most recent lab result is older than the maximum 21 days allowed.).   Assessment and  Plan  Atrial flutter atypical 2:1 conduction  Pacemaker-Saint Jude DOI 6/23   HFpEF  Anemia   Bradycardia   HTN  PVCs  Fatigue  No significant interval atrial arrhythmias although there was a brief 1 that was symptomatic.  She is encouraged to use her as needed verapamil.  Pacemaker  implantation has seemed to protect her from the profound fatigue episodes associated with the bradycardia.  She continues to have exercise intolerance and some fatigue.  I wonder whether it is drug related, we will take a 2-week trial to hold her Cardizem.  Her heart rate excursion is adequate.  She does have HFpEF.  We will continue her diuretics, she is taking hydrochlorothiazide in addition to furosemide.  I wonder whether she would be a candidate for an SGLT2.  We will reach out to her primary cardiologist.  Last hemoglobin was down to 11.2 from 13.2.  We will recheck; continue Eliquis at 5 twice daily dosed within creatinine less than 1.5 and a weight greater than 60 kg Blood pressure is well controlled.  We will continue the losartan HCT 50/12.5 and the diltiazem (see above)     Current medicines are reviewed at length with the patient today .  The patient does not  have concerns regarding medicines.

## 2022-05-10 NOTE — Patient Instructions (Signed)
Medication Instructions:  - Your physician has recommended you make the following change in your medication:   1) HOLD Diltiazem x 2 weeks If you feel no different off this after 2 weeks, then resume If you feel better off this after 2 weeks, then call the office at [phone] or send a MyChart message so Dr. Caryl Comes can decide what are next steps  *If you need a refill on your cardiac medications before your next appointment, please call your pharmacy*   Lab Work: - Your physician recommends that you have lab work today:  Arboriculturist at [facility] 1st desk on the right to check in (REGISTRATION)  Lab hours: Monday- Friday (7:30 am- 5:30 pm)  If you have labs (blood work) drawn today and your tests are completely normal, you will receive your results only by: MyChart Message (if you have MyChart) OR A paper copy in the mail If you have any lab test that is abnormal or we need to change your treatment, we will call you to review the results.   Testing/Procedures: - none ordered   Follow-Up: At [facility], you and your health needs are our priority.  As part of our continuing mission to provide you with exceptional heart care, we have created designated Provider Care Teams.  These Care Teams include your primary Cardiologist (physician) and Advanced Practice Providers (APPs -  Physician Assistants and Nurse Practitioners) who all work together to provide you with the care you need, when you need it.  We recommend signing up for the patient portal called "MyChart".  Sign up information is provided on this After Visit Summary.  MyChart is used to connect with patients for Virtual Visits (Telemedicine).  Patients are able to view lab/test results, encounter notes, upcoming appointments, etc.  Non-urgent messages can be sent to your provider as well.   To learn more about what you can do with MyChart, go to [email or web address].    Your next appointment:   9  month(s)  The format for your next appointment:   In Person  Provider:   Virl Axe, MD    Other Instructions N/a  Important Information About Sugar

## 2022-05-12 NOTE — Progress Notes (Signed)
Remote pacemaker transmission.   

## 2022-05-25 ENCOUNTER — Ambulatory Visit: Payer: Medicare PPO | Admitting: Internal Medicine

## 2022-05-25 ENCOUNTER — Encounter: Payer: Self-pay | Admitting: Internal Medicine

## 2022-05-25 ENCOUNTER — Ambulatory Visit: Payer: Medicare PPO | Attending: Internal Medicine | Admitting: Internal Medicine

## 2022-05-25 ENCOUNTER — Other Ambulatory Visit: Payer: Self-pay

## 2022-05-25 VITALS — BP 128/60 | HR 65 | Ht 63.0 in | Wt 151.6 lb

## 2022-05-25 DIAGNOSIS — Z950 Presence of cardiac pacemaker: Secondary | ICD-10-CM | POA: Diagnosis not present

## 2022-05-25 DIAGNOSIS — I5032 Chronic diastolic (congestive) heart failure: Secondary | ICD-10-CM

## 2022-05-25 DIAGNOSIS — I4892 Unspecified atrial flutter: Secondary | ICD-10-CM | POA: Diagnosis not present

## 2022-05-25 DIAGNOSIS — I480 Paroxysmal atrial fibrillation: Secondary | ICD-10-CM

## 2022-05-25 DIAGNOSIS — I10 Essential (primary) hypertension: Secondary | ICD-10-CM | POA: Diagnosis not present

## 2022-05-25 MED ORDER — DILTIAZEM HCL ER COATED BEADS 120 MG PO CP24: 120.0000 mg | ORAL_CAPSULE | Freq: Every day | ORAL | 3 refills | Status: DC

## 2022-05-25 MED ORDER — LOSARTAN POTASSIUM 50 MG PO TABS: 50.0000 mg | ORAL_TABLET | Freq: Every day | ORAL | 3 refills | Status: DC

## 2022-05-25 NOTE — Progress Notes (Signed)
Follow-up Outpatient Visit Date: 05/25/2022  Primary Care Provider: Lauro Regulus, MD [address] [facility] [place] [address]  Chief Complaint: Follow-up atrial flutter and HFpEF  HPI:  Meagan Wilcox is a 88 y.o. female with history of paroxysmal atrial fibrillation/flutter complicated by bradycardia status post dual-chamber pacemaker (01/2022), chronic HFpEF, hypertension, hyperlipidemia, hypothyroidism, GERD, and arthritis, who presents for follow-up of atrial fibrillation/flutter and HFpEF.  She was last seen in our office by Ward Givens, NP, in June for evaluation of recurrent palpitations.  She was transferred to the ED and subsequently underwent cardioversion.  She has followed with Dr. Graciela Husbands and is status post pacemaker placement.  At their last visit approximately 2 weeks ago, Ms. Manocchio reported that she had not had any further episodes of severe weakness though she complained of some fatigue.  Dr. Graciela Husbands elected to hold diltiazem to see if this would help her symptoms.  Today, Ms. Preyer reports that she still has some fatigue.  Discontinuation of diltiazem did not make her feel any better.  She still gets tired easily when doing light tasks at home such as washing the dishes.  This has been progressing for years but seems to be most pronounced over the last 9 to 12 months.  She does not exercise regularly and has not been doing physical therapy since leaving rehab after her hospitalization at the beginning of this year.  She denies chest pain.  Palpitations have been minimal such that she has not needed to take any as needed verapamil.  Lower extremity edema is mild and stable, typically worse on the left.  She denies bleeding, remaining on apixaban.  --------------------------------------------------------------------------------------------------  Past Medical History:  Diagnosis Date   (HFpEF) heart failure with preserved ejection fraction (HCC)    a.  01/2021 Echo: EF 55-60%, no rwma, GrII DD, nl RV fxn, mod elev PASP. Mild MR, mild-mod TR.   Abnormal mammogram, unspecified 2011   Arthritis 2002   Chronic pain syndrome 08/01/2017   GERD (gastroesophageal reflux disease)    Heart murmur 1985   Hyperlipidemia    Hypertension 2005   Hypothyroidism    Persistent atrial fibrillation/Atypical atrial flutter (HCC)    a. 04/2019 TEE/DCCV; b. 05/2019 Recurrent Aflutter-->Amio-->DCCV; c. CHA2DS2VASc = 4-->Eliquis; d. 08/2021 recurrent Afib req DCCV.  Flecainide started; e. 09/2021 Flecainide d/c'd 2/2 brady - couldn't tol bb.   PSVT (paroxysmal supraventricular tachycardia)    a. 09/2021 Zio: Predominantly sinus rhythm (67; range 49-102).  Rare PACs with occasional PVCs (PVC burden 3.6%).  44 SVT runs-fastest 190, longest 12.9 seconds.  No sustained arrhythmias/prolonged pauses.  No significant arrhythmia identified corresponding to patient's symptoms.   Past Surgical History:  Procedure Laterality Date   APPENDECTOMY  1939   BREAST BIOPSY Left 02-24-14   benign   BREAST BIOPSY Left 02/11/2016   neg   BREAST BIOPSY Left 2013   neg   BREAST EXCISIONAL BIOPSY Left 2014   neg   BREAST SURGERY Left 2013   stereotactic biopsy    BREAST SURGERY Left May 2010   1 mm foci of atypical ductal hyperplasia   CARDIOVERSION N/A 05/08/2019   Procedure: CARDIOVERSION;  Surgeon: Debbe Odea, MD;  Location: [facility] ORS;  Service: Cardiovascular;  Laterality: N/A;   CARDIOVERSION N/A 05/31/2019   Procedure: CARDIOVERSION;  Surgeon: Antonieta Iba, MD;  Location: [facility];  Service: Cardiovascular;  Laterality: N/A;   CARDIOVERSION N/A 08/25/2021   Procedure: CARDIOVERSION;  Surgeon: Debbe Odea,  MD;  Location: [facility];  Service: Cardiovascular;  Laterality: N/A;   CARDIOVERSION N/A 12/29/2021   Procedure: CARDIOVERSION;  Surgeon: Debbe Odea, MD;  Location: [facility];  Service: Cardiovascular;  Laterality: N/A;   cataract surgery  Bilateral  2009   COLONOSCOPY  2010   Dr. Mechele Collin   left breast biopsy   2009   PACEMAKER IMPLANT N/A 01/26/2022   Procedure: PACEMAKER IMPLANT;  Surgeon: Duke Salvia, MD;  Location: [facility] INVASIVE CV LAB;  Service: Cardiovascular;  Laterality: N/A;   TEE WITHOUT CARDIOVERSION N/A 05/08/2019   Procedure: TRANSESOPHAGEAL ECHOCARDIOGRAM (TEE);  Surgeon: Debbe Odea, MD;  Location: [facility] OR[facility];  Service: Cardiovascular;  Laterality: N/A;    Current Meds  Medication Sig   acetaminophen (TYLENOL) 325 MG tablet Take 325 mg by mouth every 6 (six) hours as needed for mild pain, fever, headache or moderate pain.   amitriptyline (ELAVIL) 25 MG tablet TAKE 1 TABLET BY MOUTH DAILY   BIOTIN PO Take 1 tablet by mouth daily.   Camphor-Menthol-Methyl Sal (SALONPAS) 3.08-20-08 % PTCH Place 1 patch onto the skin daily as needed (back pain).   ELIQUIS 5 MG TABS tablet TAKE 1 TABLET(5 MG) BY MOUTH TWICE DAILY   furosemide (LASIX) 20 MG tablet Take 1 tablet (20 mg total) by mouth daily.   levothyroxine (SYNTHROID) 125 MCG tablet TAKE 1 TABLET(125 MCG) BY MOUTH DAILY   LORazepam (ATIVAN) 0.5 MG tablet Take 1 tablet by mouth every 12 (twelve) hours as needed for anxiety.   losartan-hydrochlorothiazide (HYZAAR) 50-12.5 MG tablet Take 1 tablet by mouth daily.   melatonin 5 MG TABS Take 1 tablet (5 mg total) by mouth at bedtime.   Multiple Vitamin (MULTIVITAMIN) capsule Take 1 capsule by mouth daily.   omeprazole (PRILOSEC) 20 MG capsule TAKE 1 CAPSULE BY MOUTH DAILY   pentosan polysulfate (ELMIRON) 100 MG capsule Take 100 mg by mouth 2 (two) times daily.   psyllium (METAMUCIL SMOOTH TEXTURE) 28 % packet Take 1 packet by mouth daily at 8 pm.   simvastatin (ZOCOR) 20 MG tablet TAKE 1 TABLET BY MOUTH DAILY   verapamil (CALAN) 40 MG tablet Take 1 tablet (40 mg) by mouth every 4 hours up to 3 doses as needed for recurrent tachycardia    Allergies: Amoxicillin and Penicillins  Social History   Tobacco Use   Smoking  status: Never   Smokeless tobacco: Never  Vaping Use   Vaping Use: Never used  Substance Use Topics   Alcohol use: No    Alcohol/week: 0.0 standard drinks of alcohol   Drug use: No    Family History  Problem Relation Age of Onset   Heart disease Mother    Heart disease Father    Diabetes Father    Stroke Father    Arthritis Father     Review of Systems: A 12-system review of systems was performed and was negative except as noted in the HPI.  --------------------------------------------------------------------------------------------------  Physical Exam: BP 128/60 (BP Location: Left Arm, Patient Position: Sitting, Cuff Size: Normal)   Pulse 65   Ht 5\' 3"  (1.6 m)   Wt 151 lb 9.6 oz (68.8 kg)   SpO2 98%   BMI 26.85 kg/m   General:  NAD. Neck: No JVD or HJR. Lungs: Clear to auscultation bilaterally without wheezes or crackles. Heart: Regular rate and rhythm without murmurs, rubs, or gallops.  Left upper chest pacemaker incision is well-healed. Abdomen: Soft, nontender, nondistended. Extremities: No lower extremity edema.  EKG: Atrially paced rhythm  with incomplete right bundle branch block and borderline LVH.  No significant change from prior tracing on 05/10/2022.  Lab Results  Component Value Date   WBC 7.5 05/10/2022   HGB 12.1 05/10/2022   HCT 35.7 (L) 05/10/2022   MCV 91.8 05/10/2022   PLT 208 05/10/2022    Lab Results  Component Value Date   NA 134 (L) 01/18/2022   K 4.0 01/18/2022   CL 104 01/18/2022   CO2 26 01/18/2022   BUN 17 01/18/2022   CREATININE 0.84 01/18/2022   GLUCOSE 107 (H) 01/18/2022   ALT 15 08/29/2021    --------------------------------------------------------------------------------------------------  ASSESSMENT AND PLAN: Persistent atrial fibrillation/flutter and symptomatic bradycardia status post pacemaker: Ms. Swint has only had brief sporadic palpitations.  EKG shows atrially paced rhythm today.  Her fatigue did not improve at  all with discontinuation of diltiazem about 2 weeks ago.  I will have her restart diltiazem 120 mg daily.  She can continue to use verapamil on an as-needed basis for breakthrough palpitations.  Continue apixaban 5 mg twice daily.  Ongoing device follow-up per Dr. Caryl Comes.  Chronic HFpEF: Ms. Mcaulay appears euvolemic on exam.  She continues to have fatigue consistent with NYHA class III heart failure.  However, I wonder if deconditioning may also be playing a role in her fatigue.  I agree with Dr. Caryl Comes that Ms. Marseille does not need to be on furosemide and HCTZ.  We will therefore discontinue the HCTZ component of her losartan-HCTZ.  She can continue taking furosemide 20 mg daily.  If she has edema or weight gain, she should let us know.  We also discussed addition of an SGLT2 inhibitor, though Ms. Puckett wishes to defer this for now in favor of trying to improve her stamina.  Hypertension: Blood pressure well controlled today.  As above, we will discontinue HCTZ and restart diltiazem 120 mg daily.  Continue losartan 50 mg daily.  Follow-up: Return to clinic in 3 months.  Nelva Bush, MD 05/25/2022 10:52 AM

## 2022-05-25 NOTE — Patient Instructions (Addendum)
Medication Instructions:   - RESTART Diltiazem CD 120 MG TABLET BY MOUTH DAILY.  -STOP Losartan/HCTZ 50- 12.5 mg tablet  -START Losartan 50 mg tablet by mouth daily.  *If you need a refill on your cardiac medications before your next appointment, please call your pharmacy*   Lab Work:  NONE  If you have labs (blood work) drawn today and your tests are completely normal, you will receive your results only by: [place] (if you have MyChart) OR A paper copy in the mail If you have any lab test that is abnormal or we need to change your treatment, we will call you to review the results.   Testing/Procedures:  NONE   Follow-Up: At [facility], you and your health needs are our priority.  As part of our continuing mission to provide you with exceptional heart care, we have created designated Provider Care Teams.  These Care Teams include your primary Cardiologist (physician) and Advanced Practice Providers (APPs -  Physician Assistants and Nurse Practitioners) who all work together to provide you with the care you need, when you need it.  We recommend signing up for the patient portal called "MyChart".  Sign up information is provided on this After Visit Summary.  MyChart is used to connect with patients for Virtual Visits (Telemedicine).  Patients are able to view lab/test results, encounter notes, upcoming appointments, etc.  Non-urgent messages can be sent to your provider as well.   To learn more about what you can do with MyChart, go to [email or web address].    Your next appointment:   3 month(s)  The format for your next appointment:   In Person  Provider:   You may see Nelva Bush, MD or one of the following Advanced Practice Providers on your designated Care Team:   Murray Hodgkins, NP Christell Faith, PA-C Cadence Kathlen Mody, PA-C Gerrie Nordmann, NP   Important Information About Sugar

## 2022-06-23 ENCOUNTER — Other Ambulatory Visit: Payer: Self-pay | Admitting: Internal Medicine

## 2022-06-23 DIAGNOSIS — I480 Paroxysmal atrial fibrillation: Secondary | ICD-10-CM

## 2022-06-27 ENCOUNTER — Emergency Department: Payer: Medicare PPO

## 2022-06-27 ENCOUNTER — Encounter: Payer: Self-pay | Admitting: Internal Medicine

## 2022-06-27 ENCOUNTER — Observation Stay
Admission: EM | Admit: 2022-06-27 | Discharge: 2022-06-28 | Disposition: A | Discharge status: Home / Self Care | Payer: Medicare PPO | Attending: Hospitalist | Admitting: Hospitalist

## 2022-06-27 ENCOUNTER — Other Ambulatory Visit: Payer: Self-pay

## 2022-06-27 DIAGNOSIS — I5032 Chronic diastolic (congestive) heart failure: Secondary | ICD-10-CM | POA: Diagnosis not present

## 2022-06-27 DIAGNOSIS — I110 Hypertensive heart disease with heart failure: Secondary | ICD-10-CM | POA: Insufficient documentation

## 2022-06-27 DIAGNOSIS — I480 Paroxysmal atrial fibrillation: Secondary | ICD-10-CM

## 2022-06-27 DIAGNOSIS — I10 Essential (primary) hypertension: Secondary | ICD-10-CM | POA: Diagnosis not present

## 2022-06-27 DIAGNOSIS — Z79890 Hormone replacement therapy (postmenopausal): Secondary | ICD-10-CM | POA: Insufficient documentation

## 2022-06-27 DIAGNOSIS — R5382 Chronic fatigue, unspecified: Secondary | ICD-10-CM | POA: Insufficient documentation

## 2022-06-27 DIAGNOSIS — R5383 Other fatigue: Secondary | ICD-10-CM | POA: Diagnosis present

## 2022-06-27 DIAGNOSIS — E785 Hyperlipidemia, unspecified: Secondary | ICD-10-CM | POA: Diagnosis not present

## 2022-06-27 DIAGNOSIS — I4819 Other persistent atrial fibrillation: Secondary | ICD-10-CM | POA: Diagnosis not present

## 2022-06-27 DIAGNOSIS — I4891 Unspecified atrial fibrillation: Principal | ICD-10-CM | POA: Diagnosis present

## 2022-06-27 DIAGNOSIS — Z79899 Other long term (current) drug therapy: Secondary | ICD-10-CM | POA: Diagnosis not present

## 2022-06-27 DIAGNOSIS — Z7901 Long term (current) use of anticoagulants: Secondary | ICD-10-CM | POA: Diagnosis not present

## 2022-06-27 DIAGNOSIS — Z950 Presence of cardiac pacemaker: Secondary | ICD-10-CM | POA: Diagnosis not present

## 2022-06-27 DIAGNOSIS — E039 Hypothyroidism, unspecified: Secondary | ICD-10-CM | POA: Diagnosis not present

## 2022-06-27 LAB — CBC WITH DIFFERENTIAL/PLATELET
Abs Immature Granulocytes: 0.03 K/uL (ref 0.00–0.07)
Basophils Absolute: 0.1 K/uL (ref 0.0–0.1)
Basophils Relative: 1 %
Eosinophils Absolute: 0.2 K/uL (ref 0.0–0.5)
Eosinophils Relative: 2 %
HCT: 38.4 % (ref 36.0–46.0)
Hemoglobin: 12.9 g/dL (ref 12.0–15.0)
Immature Granulocytes: 0 %
Lymphocytes Relative: 42 %
Lymphs Abs: 3.1 K/uL (ref 0.7–4.0)
MCH: 30.7 pg (ref 26.0–34.0)
MCHC: 33.6 g/dL (ref 30.0–36.0)
MCV: 91.4 fL (ref 80.0–100.0)
Monocytes Absolute: 0.6 K/uL (ref 0.1–1.0)
Monocytes Relative: 8 %
Neutro Abs: 3.4 K/uL (ref 1.7–7.7)
Neutrophils Relative %: 47 %
Platelets: 224 K/uL (ref 150–400)
RBC: 4.2 MIL/uL (ref 3.87–5.11)
RDW: 13.5 % (ref 11.5–15.5)
WBC: 7.4 K/uL (ref 4.0–10.5)
nRBC: 0 % (ref 0.0–0.2)

## 2022-06-27 LAB — BASIC METABOLIC PANEL
Anion gap: 8 (ref 5–15)
BUN: 19 mg/dL (ref 8–23)
CO2: 24 mmol/L (ref 22–32)
Calcium: 9.3 mg/dL (ref 8.9–10.3)
Chloride: 108 mmol/L (ref 98–111)
Creatinine, Ser: 0.71 mg/dL (ref 0.44–1.00)
GFR, Estimated: >60 mL/min (ref >60)
Glucose, Bld: 124 mg/dL — ABNORMAL HIGH (ref 70–99)
Potassium: 4.7 mmol/L (ref 3.5–5.1)
Sodium: 140 mmol/L (ref 135–145)

## 2022-06-27 LAB — TROPONIN I (HIGH SENSITIVITY)
Troponin I (High Sensitivity): 7 ng/L (ref <18)
Troponin I (High Sensitivity): 11 ng/L (ref <18)

## 2022-06-27 LAB — TSH: TSH: 0.488 u[IU]/mL (ref 0.350–4.500)

## 2022-06-27 LAB — T4, FREE: Free T4: 1.15 ng/dL — ABNORMAL HIGH (ref 0.61–1.12)

## 2022-06-27 MED ORDER — LORAZEPAM 0.5 MG PO TABS: 0.5000 mg | ORAL_TABLET | Freq: Two times a day (BID) | ORAL | Status: DC | PRN

## 2022-06-27 MED ORDER — APIXABAN 5 MG PO TABS
5.0000 mg | ORAL_TABLET | Freq: Two times a day (BID) | ORAL | Status: DC
  Administered 2022-06-27 – 2022-06-28 (×2): 5 mg via ORAL
  Filled 2022-06-27 (×2): qty 1

## 2022-06-27 MED ORDER — DILTIAZEM HCL 25 MG/5ML IV SOLN
15.0000 mg | Freq: Once | INTRAVENOUS | Status: AC
  Administered 2022-06-27: 15 mg via INTRAVENOUS
  Filled 2022-06-27: qty 5

## 2022-06-27 MED ORDER — SENNOSIDES-DOCUSATE SODIUM 8.6-50 MG PO TABS
1.0000 | ORAL_TABLET | Freq: Every evening | ORAL | Status: DC | PRN
  Administered 2022-06-27: 1 via ORAL
  Filled 2022-06-27: qty 1

## 2022-06-27 MED ORDER — SIMVASTATIN 20 MG PO TABS
20.0000 mg | ORAL_TABLET | Freq: Every day | ORAL | Status: DC
  Administered 2022-06-27 – 2022-06-28 (×2): 20 mg via ORAL
  Filled 2022-06-27: qty 1
  Filled 2022-06-27: qty 2

## 2022-06-27 MED ORDER — DILTIAZEM HCL-DEXTROSE 125-5 MG/125ML-% IV SOLN (PREMIX)
5.0000 mg/h | INTRAVENOUS | Status: DC
  Administered 2022-06-27: 5 mg/h via INTRAVENOUS
  Filled 2022-06-27: qty 125

## 2022-06-27 MED ORDER — DILTIAZEM HCL ER COATED BEADS 180 MG PO CP24
180.0000 mg | ORAL_CAPSULE | Freq: Every day | ORAL | Status: DC
  Administered 2022-06-27 – 2022-06-28 (×2): 180 mg via ORAL
  Filled 2022-06-27 (×2): qty 1

## 2022-06-27 MED ORDER — ONDANSETRON HCL 4 MG/2ML IJ SOLN
4.0000 mg | Freq: Four times a day (QID) | INTRAMUSCULAR | Status: DC | PRN
  Administered 2022-06-27: 4 mg via INTRAVENOUS
  Filled 2022-06-27: qty 2

## 2022-06-27 MED ORDER — LEVOTHYROXINE SODIUM 25 MCG PO TABS
125.0000 ug | ORAL_TABLET | Freq: Every day | ORAL | Status: DC
  Administered 2022-06-27 – 2022-06-28 (×2): 125 ug via ORAL
  Filled 2022-06-27: qty 3
  Filled 2022-06-27: qty 1

## 2022-06-27 MED ORDER — ONDANSETRON HCL 4 MG/2ML IJ SOLN: 4.0000 mg | Freq: Four times a day (QID) | INTRAMUSCULAR | Status: DC | PRN

## 2022-06-27 MED ORDER — ONDANSETRON HCL 4 MG PO TABS: 4.0000 mg | ORAL_TABLET | Freq: Four times a day (QID) | ORAL | Status: DC | PRN

## 2022-06-27 MED ORDER — ACETAMINOPHEN 650 MG RE SUPP: 650.0000 mg | Freq: Four times a day (QID) | RECTAL | Status: DC | PRN

## 2022-06-27 MED ORDER — APIXABAN 2.5 MG PO TABS
2.5000 mg | ORAL_TABLET | Freq: Two times a day (BID) | ORAL | Status: DC
  Administered 2022-06-27: 2.5 mg via ORAL
  Filled 2022-06-27: qty 1

## 2022-06-27 MED ORDER — DILTIAZEM HCL 25 MG/5ML IV SOLN: 20.0000 mg | Freq: Once | INTRAVENOUS | Status: DC

## 2022-06-27 MED ORDER — DILTIAZEM HCL 60 MG PO TABS
60.0000 mg | ORAL_TABLET | Freq: Once | ORAL | Status: AC
  Administered 2022-06-27: 60 mg via ORAL
  Filled 2022-06-27: qty 1

## 2022-06-27 MED ORDER — DILTIAZEM HCL ER COATED BEADS 120 MG PO CP24
120.0000 mg | ORAL_CAPSULE | Freq: Once | ORAL | Status: DC
  Filled 2022-06-27: qty 1

## 2022-06-27 MED ORDER — ACETAMINOPHEN 325 MG PO TABS
650.0000 mg | ORAL_TABLET | Freq: Four times a day (QID) | ORAL | Status: DC | PRN
  Administered 2022-06-27: 650 mg via ORAL
  Filled 2022-06-27: qty 2

## 2022-06-27 MED ORDER — ACETAMINOPHEN 325 MG PO TABS: 650.0000 mg | ORAL_TABLET | ORAL | Status: DC | PRN

## 2022-06-27 NOTE — ED Triage Notes (Signed)
Pt brought in by ems after waking up with a high heart rate and chest discomfort. EMS reports giving pt 10 mg cardizem enroute that did not help. Pt has a pacemaker and a hx of afib

## 2022-06-27 NOTE — Assessment & Plan Note (Addendum)
Continue diltiazem infusion Continue apixaban Hold verapamil Cardiology consult

## 2022-06-27 NOTE — ED Notes (Signed)
Up to bathroom with walker to void.

## 2022-06-27 NOTE — Assessment & Plan Note (Signed)
Chronic and stable.   

## 2022-06-27 NOTE — Assessment & Plan Note (Signed)
Clinically euvolemic Holding losartan and furosemide due to borderline soft blood pressure on diltiazem

## 2022-06-27 NOTE — Assessment & Plan Note (Signed)
Patient is normotensive on the diltiazem infusion.   Will hold losartan and furosemide

## 2022-06-27 NOTE — H&P (Signed)
History and Physical    Patient: Meagan Wilcox:[id number] DOB: 1931-01-19 DOA: 06/27/2022 DOS: the patient was seen and examined on 06/27/2022 PCP: Lauro Regulus, MD  Patient coming from: Home  Chief Complaint:  Chief Complaint  Patient presents with   Tachycardia    HPI: Meagan Wilcox is a 88 y.o. female with medical history significant for paroxysmal atrial fibrillation/flutter complicated by bradycardia status post dual-chamber pacemaker (01/2022), and with history of cardioversions, chronic HFpEF, hypertension, hyperlipidemia, hypothyroidism, chronic fatigue, last seen by cardiologist, Dr. Okey Dupre on 05/25/2022, who presents to the ED by EMS after she was awoken by chest discomfort and palpitations. Her heart rate was 158 when checked. Patient has been on diltiazem which was briefly discontinued by cardiologist Dr. Graciela Husbands on 9/26 as a trial to see whether this was the cause of her chronic fatigue.  It was resumed on 10/11 as her fatigue was unchanged.  She is on as needed verapamil for palpitations which she took at home but her heart rate remained in the 150s and stayed up for over 30 minutes so  her niece called EMS.  She was previously in her usual state of health, denying any recent illness.  Denies recent cough, fever or chills, abdominal pain, vomiting or diarrhea or dysuria. ED course and data review: On arrival heart rate in the 130s with otherwise normal vitals.  CBC and BMP were unremarkable.  Troponin 11.  TSH normal, free T4 slightly elevated at 1.15.  EKG showed A-fib at 149.  Chest x-ray nonacute showing chronic interstitial markings. Patient required 2 diltiazem 15 mg bolus in the ED and was subsequently placed on a diltiazem infusion.  Heart rate at the time of admission was 119.   Review of Systems: As mentioned in the history of present illness. All other systems reviewed and are negative.  Past Medical History:  Diagnosis Date   (HFpEF) heart failure with preserved  ejection fraction (HCC)    a. 01/2021 Echo: EF 55-60%, no rwma, GrII DD, nl RV fxn, mod elev PASP. Mild MR, mild-mod TR.   Abnormal mammogram, unspecified 2011   Arthritis 2002   Chronic pain syndrome 08/01/2017   GERD (gastroesophageal reflux disease)    Heart murmur 1985   Hyperlipidemia    Hypertension 2005   Hypothyroidism    Persistent atrial fibrillation/Atypical atrial flutter (HCC)    a. 04/2019 TEE/DCCV; b. 05/2019 Recurrent Aflutter-->Amio-->DCCV; c. CHA2DS2VASc = 4-->Eliquis; d. 08/2021 recurrent Afib req DCCV.  Flecainide started; e. 09/2021 Flecainide d/c'd 2/2 brady - couldn't tol bb.   PSVT (paroxysmal supraventricular tachycardia)    a. 09/2021 Zio: Predominantly sinus rhythm (67; range 49-102).  Rare PACs with occasional PVCs (PVC burden 3.6%).  44 SVT runs-fastest 190, longest 12.9 seconds.  No sustained arrhythmias/prolonged pauses.  No significant arrhythmia identified corresponding to patient's symptoms.   Past Surgical History:  Procedure Laterality Date   APPENDECTOMY  1939   BREAST BIOPSY Left 02-24-14   benign   BREAST BIOPSY Left 02/11/2016   neg   BREAST BIOPSY Left 2013   neg   BREAST EXCISIONAL BIOPSY Left 2014   neg   BREAST SURGERY Left 2013   stereotactic biopsy    BREAST SURGERY Left May 2010   1 mm foci of atypical ductal hyperplasia   CARDIOVERSION N/A 05/08/2019   Procedure: CARDIOVERSION;  Surgeon: Debbe Odea, MD;  Location: [facility] ORS;  Service: Cardiovascular;  Laterality: N/A;   CARDIOVERSION N/A 05/31/2019   Procedure: CARDIOVERSION;  Surgeon:  Antonieta Iba, MD;  Location: [facility] ORS;  Service: Cardiovascular;  Laterality: N/A;   CARDIOVERSION N/A 08/25/2021   Procedure: CARDIOVERSION;  Surgeon: Debbe Odea, MD;  Location: [facility] ORS;  Service: Cardiovascular;  Laterality: N/A;   CARDIOVERSION N/A 12/29/2021   Procedure: CARDIOVERSION;  Surgeon: Debbe Odea, MD;  Location: [facility] ORS;  Service: Cardiovascular;  Laterality: N/A;    cataract surgery  Bilateral 2009   COLONOSCOPY  2010   Dr. Mechele Collin   left breast biopsy   2009   PACEMAKER IMPLANT N/A 01/26/2022   Procedure: PACEMAKER IMPLANT;  Surgeon: Duke Salvia, MD;  Location: [facility] INVASIVE CV LAB;  Service: Cardiovascular;  Laterality: N/A;   TEE WITHOUT CARDIOVERSION N/A 05/08/2019   Procedure: TRANSESOPHAGEAL ECHOCARDIOGRAM (TEE);  Surgeon: Debbe Odea, MD;  Location: [facility] ORS;  Service: Cardiovascular;  Laterality: N/A;   Social History:  reports that she has never smoked. She has never used smokeless tobacco. She reports that she does not drink alcohol and does not use drugs.  Allergies  Allergen Reactions   Amoxicillin Diarrhea   Penicillins Rash    Did it involve swelling of the face/tongue/throat, SOB, or low BP? No Did it involve sudden or severe rash/hives, skin peeling, or any reaction on the inside of your mouth or nose? No Did you need to seek medical attention at a hospital or doctor's office? Yes When did it last happen?      65 years ago If all above answers are "NO", may proceed with cephalosporin use.    Family History  Problem Relation Age of Onset   Heart disease Mother    Heart disease Father    Diabetes Father    Stroke Father    Arthritis Father     Prior to Admission medications   Medication Sig Start Date End Date Taking? Authorizing Provider  acetaminophen (TYLENOL) 325 MG tablet Take 325 mg by mouth every 6 (six) hours as needed for mild pain, fever, headache or moderate pain.    [provider]  amitriptyline (ELAVIL) 25 MG tablet TAKE 1 TABLET BY MOUTH DAILY 01/24/22   Corky Downs, MD  apixaban (ELIQUIS) 5 MG TABS tablet TAKE 1 TABLET(5 MG) BY MOUTH TWICE DAILY 06/23/22   End, Cristal Deer, MD  BIOTIN PO Take 1 tablet by mouth daily.    [provider]  Camphor-Menthol-Methyl Sal (SALONPAS) 3.08-20-08 % PTCH Place 1 patch onto the skin daily as needed (back pain).    [provider]  diltiazem  (CARDIZEM CD) 120 MG 24 hr capsule Take 1 capsule (120 mg total) by mouth daily. 05/25/22   End, Cristal Deer, MD  furosemide (LASIX) 20 MG tablet TAKE 1 TABLET(20 MG) BY MOUTH DAILY 06/23/22   End, Cristal Deer, MD  levothyroxine (SYNTHROID) 125 MCG tablet TAKE 1 TABLET(125 MCG) BY MOUTH DAILY 06/27/21   Corky Downs, MD  LORazepam (ATIVAN) 0.5 MG tablet Take 1 tablet by mouth every 12 (twelve) hours as needed for anxiety. 09/22/21   [provider]  losartan (COZAAR) 50 MG tablet Take 1 tablet (50 mg total) by mouth daily. 05/25/22 05/20/23  End, Cristal Deer, MD  melatonin 5 MG TABS Take 1 tablet (5 mg total) by mouth at bedtime. 08/31/21   Tresa Moore, MD  Multiple Vitamin (MULTIVITAMIN) capsule Take 1 capsule by mouth daily.    [provider]  omeprazole (PRILOSEC) 20 MG capsule TAKE 1 CAPSULE BY MOUTH DAILY 03/17/22   Corky Downs, MD  pentosan polysulfate (ELMIRON) 100 MG capsule Take  100 mg by mouth 2 (two) times daily.    [provider]  psyllium (METAMUCIL SMOOTH TEXTURE) 28 % packet Take 1 packet by mouth daily at 8 pm.    [provider]  simvastatin (ZOCOR) 20 MG tablet TAKE 1 TABLET BY MOUTH DAILY 12/14/21   Corky DownsMasoud, Javed, MD  verapamil (CALAN) 40 MG tablet Take 1 tablet (40 mg) by mouth every 4 hours up to 3 doses as needed for recurrent tachycardia 12/02/21   Duke SalviaKlein, Steven C, MD    Physical Exam: Vitals:   06/27/22 0112 06/27/22 0242 06/27/22 0345 06/27/22 0400  BP: (!) 131/59 (!) 141/85 112/77   Pulse: 78 91 (!) 124   Resp: 17 15 20    Temp:    98 F (36.7 C)  TempSrc:      SpO2: 98% 98% 95%   Weight:      Height:       Physical Exam Vitals and nursing note reviewed.  Constitutional:      General: She is not in acute distress. HENT:     Head: Normocephalic and atraumatic.  Cardiovascular:     Rate and Rhythm: Tachycardia present. Rhythm irregular.     Heart sounds: Normal heart sounds.  Pulmonary:     Effort: Pulmonary effort is  normal.     Breath sounds: Normal breath sounds.  Abdominal:     Palpations: Abdomen is soft.     Tenderness: There is no abdominal tenderness.  Neurological:     Mental Status: Mental status is at baseline.     Labs on Admission: I have personally reviewed following labs and imaging studies  CBC: Recent Labs  Lab 06/27/22 0052  WBC 7.4  NEUTROABS 3.4  HGB 12.9  HCT 38.4  MCV 91.4  PLT 224   Basic Metabolic Panel: Recent Labs  Lab 06/27/22 0052  NA 140  K 4.7  CL 108  CO2 24  GLUCOSE 124*  BUN 19  CREATININE 0.71  CALCIUM 9.3   GFR: Estimated Creatinine Clearance: 45.2 mL/min (by C-G formula based on SCr of 0.71 mg/dL). Liver Function Tests: No results for input(s): "AST", "ALT", "ALKPHOS", "BILITOT", "PROT", "ALBUMIN" in the last 168 hours. No results for input(s): "LIPASE", "AMYLASE" in the last 168 hours. No results for input(s): "AMMONIA" in the last 168 hours. Coagulation Profile: No results for input(s): "INR", "PROTIME" in the last 168 hours. Cardiac Enzymes: No results for input(s): "CKTOTAL", "CKMB", "CKMBINDEX", "TROPONINI" in the last 168 hours. BNP (last 3 results) No results for input(s): "PROBNP" in the last 8760 hours. HbA1C: No results for input(s): "HGBA1C" in the last 72 hours. CBG: No results for input(s): "GLUCAP" in the last 168 hours. Lipid Profile: No results for input(s): "CHOL", "HDL", "LDLCALC", "TRIG", "CHOLHDL", "LDLDIRECT" in the last 72 hours. Thyroid Function Tests: Recent Labs    06/27/22 0052  TSH 0.488  FREET4 1.15*   Anemia Panel: No results for input(s): "VITAMINB12", "FOLATE", "FERRITIN", "TIBC", "IRON", "RETICCTPCT" in the last 72 hours. Urine analysis:    Component Value Date/Time   COLORURINE STRAW (A) 12/29/2021 0017   APPEARANCEUR CLEAR (A) 12/29/2021 0017   LABSPEC 1.006 12/29/2021 0017   PHURINE 5.0 12/29/2021 0017   GLUCOSEU NEGATIVE 12/29/2021 0017   HGBUR MODERATE (A) 12/29/2021 0017   BILIRUBINUR  NEGATIVE 12/29/2021 0017   KETONESUR NEGATIVE 12/29/2021 0017   PROTEINUR NEGATIVE 12/29/2021 0017   NITRITE NEGATIVE 12/29/2021 0017   LEUKOCYTESUR NEGATIVE 12/29/2021 0017    Radiological Exams on Admission: DG  Chest Port 1 View  Result Date: 06/27/2022 CLINICAL DATA:  Chest pain EXAM: PORTABLE CHEST 1 VIEW COMPARISON:  January 26, 2022 FINDINGS: There is a dual lead AICD with stable lead wire positioning. The heart size and mediastinal contours are within normal limits. Mild, chronic appearing increased lung markings are seen. There is no evidence of an acute infiltrate, pleural effusion or pneumothorax. The visualized skeletal structures are unremarkable. IMPRESSION: Chronic appearing increased lung markings without evidence of acute or active cardiopulmonary disease. Electronically Signed   By: Aram Candela M.D.   On: 06/27/2022 01:14     Data Reviewed: Relevant notes from primary care and specialist visits, past discharge summaries as available in EHR, including Care Everywhere. Prior diagnostic testing as pertinent to current admission diagnoses Updated medications and problem lists for reconciliation ED course, including vitals, labs, imaging, treatment and response to treatment Triage notes, nursing and pharmacy notes and ED provider's notes Notable results as noted in HPI   Assessment and Plan: * Rapid atrial fibrillation (HCC) Continue diltiazem infusion Continue apixaban Hold verapamil Cardiology consult  Cardiac pacemaker 01/26/22 Indicated for symptomatic bradycardia Patient is followed by Dr. Graciela Husbands  Hypothyroidism Continue levothyroxine  Chronic heart failure with preserved ejection fraction (HFpEF) (HCC) Clinically euvolemic Holding losartan and furosemide due to borderline soft blood pressure on diltiazem  Fatigue Chronic and stable  Essential hypertension Patient is normotensive on the diltiazem infusion.   Will hold losartan and  furosemide        DVT prophylaxis: Epics back  Consults: [facility] cardiology, Dr. Mariah Milling  Advance Care Planning:   Code Status: Prior   Family Communication: Niece, [employer]  Disposition Plan: Back to previous home environment  Severity of Illness: The appropriate patient status for this patient is INPATIENT. Inpatient status is judged to be reasonable and necessary in order to provide the required intensity of service to ensure the patient's safety. The patient's presenting symptoms, physical exam findings, and initial radiographic and laboratory data in the context of their chronic comorbidities is felt to place them at high risk for further clinical deterioration. Furthermore, it is not anticipated that the patient will be medically stable for discharge from the hospital within 2 midnights of admission.   * I certify that at the point of admission it is my clinical judgment that the patient will require inpatient hospital care spanning beyond 2 midnights from the point of admission due to high intensity of service, high risk for further deterioration and high frequency of surveillance required.*  Author: Andris Baumann, MD 06/27/2022 5:03 AM  For on call review [email or web address].

## 2022-06-27 NOTE — Care Management CC44 (Signed)
Condition Code 44 Documentation Completed  Patient Details  Name: LYNSIE MCWATTERS MRN: [id number] Date of Birth: 09/05/1930   Condition Code 44 given:  Yes Patient signature on Condition Code 44 notice:  Yes Documentation of 2 MD's agreement:  Yes Code 44 added to claim:  Yes    Allayne Butcher, RN 06/27/2022, 3:25 PM

## 2022-06-27 NOTE — ED Provider Notes (Signed)
[facility] Provider Note    Event Date/Time   First MD Initiated Contact with Patient 06/27/22 [phone]     (approximate)   History   Tachycardia   HPI  Meagan Wilcox is a 88 y.o. female   Past medical history of atrial fibrillation on Eliquis, failure with preserved ejection fraction, hyperlipidemia, hypothyroid, pacemaker placement who presents to the emergency department with heart palpitations that awoke her from sleep.  EMS says that she was A-fib with RVR in the 150s but normotensive.  At home, she used verapamil that was advised by her cardiologist to take for breakthrough palpitations but her heart rate remained in the 150s, prompting her call to EMS.  She took the verapamil before 11 PM.  Patient denies chest pain, shortness of breath, or recent illnesses.  Denies respiratory infectious symptoms, abdominal pain nausea vomiting diarrhea, urinary symptoms and has had normal intake.  She had an uneventful day yesterday went to church did some activities.  She has been compliant with her medications, which has had a recent switch in the last month to try to maintain proper rate control as she has dealt with both bradycardia and tachycardia in the setting of her atrial fibrillation.  History was obtained via the patient. Dependent historian is present including her niece who provides collateral information including detailing recent medication changes. I reviewed external medical chart including a cardiology note dated 05/25/2022 where she was restarted on her diltiazem and advised to use verapamil as needed for breakthrough palpitations.      Physical Exam   Triage Vital Signs: ED Triage Vitals  Enc Vitals Group     BP      Pulse      Resp      Temp      Temp src      SpO2      Weight      Height      Head Circumference      Peak Flow      Pain Score      Pain Loc      Pain Edu?      Excl. in GC?     Most recent vital signs: Vitals:    06/27/22 0242 06/27/22 0345  BP: (!) 141/85 112/77  Pulse: 91 (!) 124  Resp: 15 20  Temp:    SpO2: 98% 95%    General: Awake, no distress.  CV:  Good peripheral perfusion.  Resp:  Normal effort.  Abd:  No distention.  Other:  Tachycardic and irregular rate on palpation.  Lungs clear to auscultation, abdomen soft and nontender, she is alert and oriented and pleasant.   ED Results / Procedures / Treatments   Labs (all labs ordered are listed, but only abnormal results are displayed) Labs Reviewed  BASIC METABOLIC PANEL - Abnormal; Notable for the following components:      Result Value   Glucose, Bld 124 (*)    All other components within normal limits  T4, FREE - Abnormal; Notable for the following components:   Free T4 1.15 (*)    All other components within normal limits  CBC WITH DIFFERENTIAL/PLATELET  TSH  TROPONIN I (HIGH SENSITIVITY)  TROPONIN I (HIGH SENSITIVITY)     I reviewed labs and they are notable for trop 4  EKG  ED ECG REPORT I, Pilar Jarvis, the attending physician, personally viewed and interpreted this ECG.   Date: 06/27/2022  EKG Time: 0052  Rate: 149  Rhythm: atrial fibrillation, rate 150  Axis: nl  Intervals:none  ST&T Change: No ischemic changes    RADIOLOGY I independently reviewed and interpreted chest x-ray and see no obvious focal opacities or pneumothorax   PROCEDURES:  Critical Care performed: Yes, see critical care procedure note(s)  .Critical Care  Performed by: Pilar Jarvis, MD Authorized by: Pilar Jarvis, MD   Critical care provider statement:    Critical care time (minutes):  30   Critical care was necessary to treat or prevent imminent or life-threatening deterioration of the following conditions:  Cardiac failure (Atrial fibrillation with rapid ventricular response requiring IV rate control medications)   Critical care was time spent personally by me on the following activities:  Development of treatment plan with patient  or surrogate, discussions with consultants, evaluation of patient's response to treatment, examination of patient, ordering and review of laboratory studies, ordering and review of radiographic studies, ordering and performing treatments and interventions, pulse oximetry, re-evaluation of patient's condition and review of old charts    MEDICATIONS ORDERED IN ED: Medications  diltiazem (CARDIZEM) 125 mg in dextrose 5% 125 mL (1 mg/mL) infusion (has no administration in time range)  diltiazem (CARDIZEM) injection 15 mg (15 mg Intravenous Given 06/27/22 0109)  diltiazem (CARDIZEM) tablet 60 mg (60 mg Oral Given 06/27/22 0143)  diltiazem (CARDIZEM) injection 15 mg (15 mg Intravenous Given 06/27/22 0244)     IMPRESSION / MDM / ASSESSMENT AND PLAN / ED COURSE  I reviewed the triage vital signs and the nursing notes.                              Differential diagnosis includes, but is not limited to, A fibrillation with rapid ventricular response, ACS, infection, dehydration, metabolic derangements   The patient is on the cardiac monitor to evaluate for evidence of arrhythmia and/or significant heart rate changes.  MDM: This patient with A-fib and RVR with no inciting events, no underlying suspicion for infection, ACS, dehydration to elicit this response.  She has had recent changes in her medications.  She has no other symptoms other than palpitations, will administer IV diltiazem 15 mg, which achieved a good response now heart rate is in the low 100s, will follow with p.o. diltiazem and if she remains stable from a hemodynamic standpoint and asymptomatic I anticipate she will be discharged from the emergency department with a close cardiology follow-up.  I obtained labs including CBC, BMP, troponins to assess for electrolyte derangements, cardiac ischemia, though I think unlikely due to no chest pain and nonischemic EKG.   She received diltiazem IV 15 mg x 2 and p.o. diltiazem 60 mg x 1 but  remains tachycardic 110-130s, improved but not yet stabilized for discharge.  Start diltiazem infusion admit to hospitalist.  Patient stable blood pressures and asymptomatic  Patient's presentation is most consistent with acute presentation with potential threat to life or bodily function.       FINAL CLINICAL IMPRESSION(S) / ED DIAGNOSES   Final diagnoses:  Atrial fibrillation with rapid ventricular response (HCC)     Rx / DC Orders   ED Discharge Orders     None        Note:  This document was prepared using Dragon voice recognition software and may include unintentional dictation errors.    Pilar Jarvis, MD 06/27/22 [phone]

## 2022-06-27 NOTE — Assessment & Plan Note (Signed)
Continue levothyroxine 

## 2022-06-27 NOTE — Care Management Obs Status (Signed)
MEDICARE OBSERVATION STATUS NOTIFICATION   Patient Details  Name: Meagan Wilcox MRN: [id number] Date of Birth: 03/22/1931   Medicare Observation Status Notification Given:  Yes    Allayne Butcher, RN 06/27/2022, 3:25 PM

## 2022-06-27 NOTE — Assessment & Plan Note (Signed)
Indicated for symptomatic bradycardia Patient is followed by Dr. Graciela Husbands

## 2022-06-27 NOTE — Discharge Instructions (Signed)
Take your Cardizem this morning as prescribed.  Call your cardiologist this morning to discuss your atrial fibrillation medications and adjustments as needed.  Thank you for choosing [place] for your health care today!  Please see your primary doctor this week for a follow up appointment.   If you do not have a primary doctor call the following clinics to establish care:  If you have insurance:  [facility] [phone] [address] [place] [place] [address]   [facility]  [phone] [address] [place] [place] [address]   If you do not have insurance:  [facility]  [phone] [address] [place] [address]  Sometimes, in the early stages of certain disease courses it is difficult to detect in the emergency department evaluation -- so, it is important that you continue to monitor your symptoms and call your doctor right away or return to the emergency department if you develop any new or worsening symptoms.  It was my pleasure to care for you today.   Daneil Dan Modesto Charon, MD

## 2022-06-27 NOTE — Consult Note (Signed)
Cardiology Consultation   Patient ID: [name] MRN: [id number]; DOB: January 23, 1931  Admit date: 06/27/2022 Date of Consult: 06/27/2022  PCP:  Kirk Ruths, MD   [facility] Cardiologist:  Nelva Bush, MD        Patient Profile:   Meagan Wilcox is a 88 y.o. female with a hx of paroxysmal atrial fibrillation/flutter complicated by bradycardia status post dual-chamber pacemaker (01/2022), chronic HFpEF, hypertension, hyperlipidemia, hypothyroidism, chronic fatigue who is being seen 06/27/2022 for the evaluation of atrial fibrillation with rapid ventricular response at the request of Dr. Damita Dunnings.  History of Present Illness:   Meagan Wilcox is an 88 year old female with past medical history of paroxysmal atrial fibrillation/flutter complicated by bradycardia status post dual-chamber pacemaker (01/2022), chronic HFpEF with an EF of 55-60%, no regional wall motion abnormalities, G2 DD, normal RV function, moderately elevated PASP, mild MR, mild to moderate TR, hypertension, hyperlipidemia, hypothyroidism, and chronic fatigue.  In September 2020 she was diagnosed atrial flutter/atypical atrial flutter, or TEE-guided cardioversion.  She had recurrent tachypalpitations October 2020 she was found to be in atrial flutter with 2 1 conduction.  She was placed on amiodarone and underwent repeat cardioversion October 2020.  Amiodarone dose was reduced to 200 mg daily in November 2020, in the setting of nausea and fatigue.  This was reduced further to 100 mg daily in June 2021.  In May 2022, she experienced dyspnea and fatigue, as well as bradycardia on home monitoring.  Formal event monitor showed an average heart rate of 55 bpm with a 5.1% PVC burden, 10 brief atrial runs, and no evidence of recurrent A-fib/flutter.  Echocardiogram at that time showed EF 55 to 60%, G2 DD,.  Amiodarone was subsequently discontinued in July 2022 in the setting frequent nausea and dizziness.  In  January 2023 in the setting of fatigue and palpitations she presented to [facility] emergency department and was found to be in recurrent atrial flutter with rates in the 160s.  She was seen by EP and placed on flecainide 50 mg twice daily as well as nadolol.  TEE and cardioversion were carried out and she was noted to be bradycardic post cardioversion.Nadolol was discontinued.  Early follow-up in February 2023 she was noted to be bradycardic and beta-blocker flecainide with both discontinued.  Repeat ZIO monitor showed no atrial fibrillation or atrial flutter but she had 44 brief runs of atrial tachycardia.  She had this in December 02, 2021 where she reported episode of recurrent tachycardia.She was placed on verapamil 40 mg every 4 hours x 3 doses as needed for breakthrough tachycardia.  It was felt she might require backup bradycardia pacing and resumption of AV nodal blocking agents versus monitoring to see whether to consent there may be appropriate antiarrhythmic for her.  Pacemaker was placed 01/26/2022.  She recently had follow-up with Dr. Caryl Comes in 04/2022 with complaints of worsening fatigue and severe weakness.  Dr. Caryl Comes elected to hold her diltiazem to see if it would help her symptoms at that time.  She was last seen in clinic on 05/25/2022 by Dr. Saunders Revel where she reported some fatigue and discontinuation of her diltiazem did not make her feel any better.  She is to get tired easily doing light tasks at home such as washing dishes.  At that visit she was restarted on diltiazem 120 mg daily and apixaban 5 mg twice daily as well as as needed verapamil.  She presented to the [facility] emergency department 06/27/2022 with  heart palpitations woke her from sleep.  EMS arrived and she was found to be in atrial fibrillation with RVR with a rate of 150s but was normotensive.  At home she had used her PRN.  She denies any chest pain, shortness of breath, recent illness.  Status of respiratory infection symptoms  abdominal pain nausea vomiting diarrhea.  Initial vitals blood pressure 141/85, pulse of 91, respirations 15, SPO2 98%  Pertinent labs: Blood glucose 124, TSH 0.488, free T4 1.15, high-sensitivity troponin of 7 and 11  Imaging: Chest x-ray revealed chronic appearing increased lung markings without evidence of acute or active cardiopulmonary disease  Medications administered in the emergency department: Cardizem 15 mg IV push, oral Cardizem 60 mg, additional 50 mg Cardizem IV push, Cardizem drip  Past Medical History:  Diagnosis Date   (HFpEF) heart failure with preserved ejection fraction (HCC)    a. 01/2021 Echo: EF 55-60%, no rwma, GrII DD, nl RV fxn, mod elev PASP. Mild MR, mild-mod TR.   Abnormal mammogram, unspecified 2011   Arthritis 2002   Chronic pain syndrome 08/01/2017   GERD (gastroesophageal reflux disease)    Heart murmur 1985   Hyperlipidemia    Hypertension 2005   Hypothyroidism    Persistent atrial fibrillation/Atypical atrial flutter (HCC)    a. 04/2019 TEE/DCCV; b. 05/2019 Recurrent Aflutter-->Amio-->DCCV; c. CHA2DS2VASc = 4-->Eliquis; d. 08/2021 recurrent Afib req DCCV.  Flecainide started; e. 09/2021 Flecainide d/c'd 2/2 brady - couldn't tol bb.   PSVT (paroxysmal supraventricular tachycardia)    a. 09/2021 Zio: Predominantly sinus rhythm (67; range 49-102).  Rare PACs with occasional PVCs (PVC burden 3.6%).  44 SVT runs-fastest 190, longest 12.9 seconds.  No sustained arrhythmias/prolonged pauses.  No significant arrhythmia identified corresponding to patient's symptoms.    Past Surgical History:  Procedure Laterality Date   APPENDECTOMY  1939   BREAST BIOPSY Left 02-24-14   benign   BREAST BIOPSY Left 02/11/2016   neg   BREAST BIOPSY Left 2013   neg   BREAST EXCISIONAL BIOPSY Left 2014   neg   BREAST SURGERY Left 2013   stereotactic biopsy    BREAST SURGERY Left May 2010   1 mm foci of atypical ductal hyperplasia   CARDIOVERSION N/A 05/08/2019   Procedure:  CARDIOVERSION;  Surgeon: Debbe Odea, MD;  Location: [facility] ORS;  Service: Cardiovascular;  Laterality: N/A;   CARDIOVERSION N/A 05/31/2019   Procedure: CARDIOVERSION;  Surgeon: Antonieta Iba, MD;  Location: [facility] ORS;  Service: Cardiovascular;  Laterality: N/A;   CARDIOVERSION N/A 08/25/2021   Procedure: CARDIOVERSION;  Surgeon: Debbe Odea, MD;  Location: [facility] ORS;  Service: Cardiovascular;  Laterality: N/A;   CARDIOVERSION N/A 12/29/2021   Procedure: CARDIOVERSION;  Surgeon: Debbe Odea, MD;  Location: [facility] ORS;  Service: Cardiovascular;  Laterality: N/A;   cataract surgery  Bilateral 2009   COLONOSCOPY  2010   Dr. Mechele Collin   left breast biopsy   2009   PACEMAKER IMPLANT N/A 01/26/2022   Procedure: PACEMAKER IMPLANT;  Surgeon: Duke Salvia, MD;  Location: [facility] INVASIVE CV LAB;  Service: Cardiovascular;  Laterality: N/A;   TEE WITHOUT CARDIOVERSION N/A 05/08/2019   Procedure: TRANSESOPHAGEAL ECHOCARDIOGRAM (TEE);  Surgeon: Debbe Odea, MD;  Location: [facility] ORS;  Service: Cardiovascular;  Laterality: N/A;     Home Medications:  Prior to Admission medications   Medication Sig Start Date End Date Taking? Authorizing Provider  acetaminophen (TYLENOL) 325 MG tablet Take 325 mg by mouth every 6 (six) hours as needed for mild pain,  fever, headache or moderate pain.    [provider]  amitriptyline (ELAVIL) 25 MG tablet TAKE 1 TABLET BY MOUTH DAILY 01/24/22   Cletis Athens, MD  apixaban (ELIQUIS) 5 MG TABS tablet TAKE 1 TABLET(5 MG) BY MOUTH TWICE DAILY 06/23/22   End, Harrell Gave, MD  BIOTIN PO Take 1 tablet by mouth daily.    [provider]  Camphor-Menthol-Methyl Sal (SALONPAS) 3.08-20-08 % PTCH Place 1 patch onto the skin daily as needed (back pain).    [provider]  diltiazem (CARDIZEM CD) 120 MG 24 hr capsule Take 1 capsule (120 mg total) by mouth daily. 05/25/22   End, Harrell Gave, MD  furosemide (LASIX) 20 MG tablet TAKE 1 TABLET(20 MG)  BY MOUTH DAILY 06/23/22   End, Harrell Gave, MD  levothyroxine (SYNTHROID) 125 MCG tablet TAKE 1 TABLET(125 MCG) BY MOUTH DAILY 06/27/21   Cletis Athens, MD  LORazepam (ATIVAN) 0.5 MG tablet Take 1 tablet by mouth every 12 (twelve) hours as needed for anxiety. 09/22/21   [provider]  losartan (COZAAR) 50 MG tablet Take 1 tablet (50 mg total) by mouth daily. 05/25/22 05/20/23  End, Harrell Gave, MD  melatonin 5 MG TABS Take 1 tablet (5 mg total) by mouth at bedtime. 08/31/21   Sidney Ace, MD  Multiple Vitamin (MULTIVITAMIN) capsule Take 1 capsule by mouth daily.    [provider]  omeprazole (PRILOSEC) 20 MG capsule TAKE 1 CAPSULE BY MOUTH DAILY 03/17/22   Cletis Athens, MD  pentosan polysulfate (ELMIRON) 100 MG capsule Take 100 mg by mouth 2 (two) times daily.    [provider]  psyllium (METAMUCIL SMOOTH TEXTURE) 28 % packet Take 1 packet by mouth daily at 8 pm.    [provider]  simvastatin (ZOCOR) 20 MG tablet TAKE 1 TABLET BY MOUTH DAILY 12/14/21   Cletis Athens, MD  verapamil (CALAN) 40 MG tablet Take 1 tablet (40 mg) by mouth every 4 hours up to 3 doses as needed for recurrent tachycardia 12/02/21   Deboraha Sprang, MD    Inpatient Medications: Scheduled Meds:  apixaban  2.5 mg Oral BID   levothyroxine  125 mcg Oral Q0600   simvastatin  20 mg Oral Daily   Continuous Infusions:  diltiazem (CARDIZEM) infusion 5 mg/hr (06/27/22 0437)   PRN Meds: acetaminophen **OR** acetaminophen, LORazepam, ondansetron **OR** ondansetron (ZOFRAN) IV, senna-docusate  Allergies:    Allergies  Allergen Reactions   Amoxicillin Diarrhea   Penicillins Rash    Did it involve swelling of the face/tongue/throat, SOB, or low BP? No Did it involve sudden or severe rash/hives, skin peeling, or any reaction on the inside of your mouth or nose? No Did you need to seek medical attention at a hospital or doctor's office? Yes When did it last happen?      65 years ago If  all above answers are "NO", may proceed with cephalosporin use.    Social History:   Social History   Socioeconomic History   Marital status: Single    Spouse name: Not on file   Number of children: Not on file   Years of education: Not on file   Highest education level: Not on file  Occupational History   Not on file  Tobacco Use   Smoking status: Never   Smokeless tobacco: Never  Vaping Use   Vaping Use: Never used  Substance and Sexual Activity   Alcohol use: No    Alcohol/week: 0.0 standard drinks of alcohol   Drug  use: No   Sexual activity: Not on file  Other Topics Concern   Not on file  Social History Narrative   Not on file   Social Determinants of Health   Financial Resource Strain: Not on file  Food Insecurity: Not on file  Transportation Needs: Not on file  Physical Activity: Not on file  Stress: Not on file  Social Connections: Not on file  Intimate Partner Violence: Not on file    Family History:    Family History  Problem Relation Age of Onset   Heart disease Mother    Heart disease Father    Diabetes Father    Stroke Father    Arthritis Father      ROS:  Please see the history of present illness.  Review of Systems  Cardiovascular:  Positive for palpitations.    All other ROS reviewed and negative.     Physical Exam/Data:   Vitals:   06/27/22 0345 06/27/22 0400 06/27/22 0515 06/27/22 0700  BP: 112/77  115/64 113/63  Pulse: (!) 124  61 (!) 42  Resp: 20  20 18   Temp:  98 F (36.7 C) 98 F (36.7 C)   TempSrc:      SpO2: 95%  94% 96%  Weight:      Height:       No intake or output data in the 24 hours ending 06/27/22 0756    06/27/2022   12:59 AM 05/25/2022   10:22 AM 05/10/2022   10:36 AM  Last 3 Weights  Weight (lbs) 158 lb 151 lb 9.6 oz 152 lb 6 oz  Weight (kg) 71.668 kg 68.765 kg 69.117 kg     Body mass index is 27.99 kg/m.  General:  Well nourished, well developed, in no acute distress HEENT: normal Neck: no  JVD Vascular: No carotid bruits; Distal pulses 2+ bilaterally Cardiac:  normal S1, S2; RRR; no murmur  Lungs:  clear to auscultation bilaterally, no wheezing, rhonchi or rales, respirations are unlabored at rest on room air Abd: soft, nontender, no hepatomegaly  Ext: no edema Musculoskeletal:  No deformities, BUE and BLE strength normal and equal Skin: warm and dry  Neuro:  CNs 2-12 intact, no focal abnormalities noted Psych:  Normal affect   EKG:  The EKG was personally reviewed and demonstrates:  atrial fibrillation RVR rate of 149 with LAD Telemetry:  Telemetry was personally reviewed and demonstrates:  sinus rate of 70, looks like she converted around 0430 this morning back into sinus  Relevant CV Studies:  TTE 02/04/21 1. Left ventricular ejection fraction, by estimation, is 55 to 60%. The  left ventricle has normal function. The left ventricle has no regional  wall motion abnormalities. There is mild left ventricular hypertrophy.  Left ventricular diastolic parameters  are consistent with Grade II diastolic dysfunction (pseudonormalization).   2. Right ventricular systolic function is normal. The right ventricular  size is mildly enlarged. There is moderately elevated pulmonary artery  systolic pressure.   3. Right atrial size was mildly dilated.   4. The mitral valve is normal in structure. Mild mitral valve  regurgitation.   5. Tricuspid valve regurgitation is mild to moderate.   6. The aortic valve was not well visualized. Aortic valve regurgitation  is not visualized.   7. The inferior vena cava is normal in size with greater than 50%  respiratory variability, suggesting right atrial pressure of 3 mmHg.   Laboratory Data:  High Sensitivity Troponin:   Recent Labs  Lab 06/27/22 0052 06/27/22 0356  TROPONINIHS 7 11     Chemistry Recent Labs  Lab 06/27/22 0052  NA 140  K 4.7  CL 108  CO2 24  GLUCOSE 124*  BUN 19  CREATININE 0.71  CALCIUM 9.3  GFRNONAA >60   ANIONGAP 8    No results for input(s): "PROT", "ALBUMIN", "AST", "ALT", "ALKPHOS", "BILITOT" in the last 168 hours. Lipids No results for input(s): "CHOL", "TRIG", "HDL", "LABVLDL", "LDLCALC", "CHOLHDL" in the last 168 hours.  Hematology Recent Labs  Lab 06/27/22 0052  WBC 7.4  RBC 4.20  HGB 12.9  HCT 38.4  MCV 91.4  MCH 30.7  MCHC 33.6  RDW 13.5  PLT 224   Thyroid  Recent Labs  Lab 06/27/22 0052  TSH 0.488  FREET4 1.15*    BNPNo results for input(s): "BNP", "PROBNP" in the last 168 hours.  DDimer No results for input(s): "DDIMER" in the last 168 hours.   Radiology/Studies:  DG Chest Port 1 View  Result Date: 06/27/2022 CLINICAL DATA:  Chest pain EXAM: PORTABLE CHEST 1 VIEW COMPARISON:  January 26, 2022 FINDINGS: There is a dual lead AICD with stable lead wire positioning. The heart size and mediastinal contours are within normal limits. Mild, chronic appearing increased lung markings are seen. There is no evidence of an acute infiltrate, pleural effusion or pneumothorax. The visualized skeletal structures are unremarkable. IMPRESSION: Chronic appearing increased lung markings without evidence of acute or active cardiopulmonary disease. Electronically Signed   By: Virgina Norfolk M.D.   On: 06/27/2022 01:14     Assessment and Plan:   Atrial fibrillation RVR -found by EMS to be in a-fib rvr with rate of 150's -currently sinus rate of 74 -wean diltiazem drip to off, started on diltiazem 180 mg daily -continue apixaban 5 mg bid for a CHA2DS2-VASc score of at least 5 -resume home verapamil prior to discharge -continue cardiac monitor  HFpEF -LVEF 55-60% -last echocardiogram completed in 01/2022 -euvolemic on exam -restart losartan once off of diltiazem drip -restart furosemide prior to discharge  Essential hypertension -blood pressure 113/63 -PTA losartan currently on hold due to soft blood pressures while on diltiazem drip -Vital signs per unit  protocol  Hyperlipidemia -continued on PTA simvastatin    Risk Assessment/Risk Scores:        New York Heart Association (NYHA) Functional Class NYHA Class II  CHA2DS2-VASc Score = 5   This indicates a 7.2% annual risk of stroke. The patient's score is based upon: CHF History: 1 HTN History: 1 Diabetes History: 0 Stroke History: 0 Vascular Disease History: 0 Age Score: 2 Gender Score: 1         For questions or updates, please contact [place] Please consult [email or web address] for contact info under    Signed, Daquann Merriott, NP  06/27/2022 7:56 AM

## 2022-06-28 DIAGNOSIS — I4891 Unspecified atrial fibrillation: Secondary | ICD-10-CM | POA: Diagnosis not present

## 2022-06-28 LAB — MAGNESIUM: Magnesium: 2.1 mg/dL (ref 1.7–2.4)

## 2022-06-28 LAB — BASIC METABOLIC PANEL WITH GFR
Anion gap: 6 (ref 5–15)
BUN: 15 mg/dL (ref 8–23)
CO2: 25 mmol/L (ref 22–32)
Calcium: 9.4 mg/dL (ref 8.9–10.3)
Chloride: 109 mmol/L (ref 98–111)
Creatinine, Ser: 0.72 mg/dL (ref 0.44–1.00)
GFR, Estimated: >60 mL/min (ref >60)
Glucose, Bld: 107 mg/dL — ABNORMAL HIGH (ref 70–99)
Potassium: 4.2 mmol/L (ref 3.5–5.1)
Sodium: 140 mmol/L (ref 135–145)

## 2022-06-28 LAB — CBC
HCT: 34.9 % — ABNORMAL LOW (ref 36.0–46.0)
Hemoglobin: 11.8 g/dL — ABNORMAL LOW (ref 12.0–15.0)
MCH: 30.6 pg (ref 26.0–34.0)
MCHC: 33.8 g/dL (ref 30.0–36.0)
MCV: 90.4 fL (ref 80.0–100.0)
Platelets: 209 10*3/uL (ref 150–400)
RBC: 3.86 MIL/uL — ABNORMAL LOW (ref 3.87–5.11)
RDW: 13.7 % (ref 11.5–15.5)
WBC: 6.3 10*3/uL (ref 4.0–10.5)
nRBC: 0 % (ref 0.0–0.2)

## 2022-06-28 MED ORDER — ATORVASTATIN CALCIUM 10 MG PO TABS: 10.0000 mg | ORAL_TABLET | Freq: Every day | ORAL | 2 refills | Status: DC

## 2022-06-28 MED ORDER — SIMVASTATIN 20 MG PO TABS: 20.0000 mg | ORAL_TABLET | Freq: Every day | ORAL | Status: DC

## 2022-06-28 MED ORDER — DILTIAZEM HCL ER COATED BEADS 180 MG PO CP24: 180.0000 mg | ORAL_CAPSULE | Freq: Every day | ORAL | 2 refills | Status: DC

## 2022-06-28 NOTE — Progress Notes (Signed)
  Transition of Care [facility]) Screening Note   Patient Details  Name: Meagan Wilcox Date of Birth: 1930-11-02   Transition of Care [facility]) CM/SW Contact:    Darolyn Rua, LCSW Phone Number: 06/28/2022, 8:50 AM   Patient to dc today no needs identified.   Transition of Care Department [facility]) has reviewed patient and no TOC needs have been identified at this time. We will continue to monitor patient advancement through interdisciplinary progression rounds. If new patient transition needs arise, please place a TOC consult.  Darolyn Rua, [place], MSW, [place] [phone]

## 2022-06-28 NOTE — Discharge Summary (Signed)
Physician Discharge Summary   Meagan Wilcox  female DOB: 28-Mar-1931  EPP:[id number]  PCP: Lauro Regulus, MD  Admit date: 06/27/2022 Discharge date: 06/28/2022  Admitted From: home Disposition:  home CODE STATUS: Full code  Discharge Instructions     Amb referral to [facility]   Complete by: As directed    Diet - low sodium heart healthy   Complete by: As directed       Hospital Course:  For full details, please see H&P, progress notes, consult notes and ancillary notes.  Briefly,  Meagan Wilcox is a 88 y.o. female with medical history significant for paroxysmal atrial fibrillation/flutter complicated by bradycardia status post dual-chamber pacemaker (01/2022), and with history of cardioversions, chronic HFpEF, hypertension, hyperlipidemia, hypothyroidism, chronic fatigue, last seen by cardiologist, Dr. Okey Dupre on 05/25/2022, who presents to the ED by EMS after she was awoken by chest discomfort and palpitations. Her heart rate was 158 when checked.   Pt was on cardizem at home, and as needed verapamil for palpitations which she took at home but her heart rate remained in the 150s and stayed up for over 30 minutes so  her niece called EMS.    * Atrial fibrillation RVR  -placed on diltiazem drip in the emergency department and shortly after converted to sinus with drip discontinued  --cardiology consulted  --Cardizem increased from 120 mg to 180 mg daily. -continue apixaban 5 mg twice daily -resume as needed verapamil on discharge   Cardiac pacemaker 01/26/22 Indicated for symptomatic bradycardia Patient is followed by Dr. Graciela Husbands   Hypothyroidism Continue levothyroxine   Chronic heart failure with preserved ejection fraction (HFpEF) (HCC) Clinically euvolemic.  LVEF 55-60%. --Home losartan and furosemide held due to borderline soft blood pressure on diltiazem gtt, both to be resumed after discharge.  HLD --switch home simvastatin to Lipitor due to higher risk of  rhabdo with simvastatin and dilt combo.   Discharge Diagnoses:  Principal Problem:   Rapid atrial fibrillation [facility]) Active Problems:   Essential hypertension   Fatigue   Chronic heart failure with preserved ejection fraction (HFpEF) (HCC)   Atrial fibrillation with rapid ventricular response (HCC)   Hypothyroidism   Cardiac pacemaker 01/26/22   30 Day Unplanned Readmission Risk Score    Flowsheet Row ED to Hosp-Admission (Current) from 06/27/2022 in [facility]  30 Day Unplanned Readmission Risk Score (%) 10.97 Filed at 06/27/2022 1200       This score is the patient's risk of an unplanned readmission within 30 days of being discharged (0 -100%). The score is based on dignosis, age, lab data, medications, orders, and past utilization.   Low:  0-14.9   Medium: 15-21.9   High: 22-29.9   Extreme: 30 and above         Discharge Instructions:  Allergies as of 06/28/2022       Reactions   Erythromycin Other (See Comments)   Amoxicillin Diarrhea   Penicillins Rash   Did it involve swelling of the face/tongue/throat, SOB, or low BP? No Did it involve sudden or severe rash/hives, skin peeling, or any reaction on the inside of your mouth or nose? No Did you need to seek medical attention at a hospital or doctor's office? Yes When did it last happen?      65 years ago If all above answers are "NO", may proceed with cephalosporin use.        Medication List     STOP taking these medications  simvastatin 20 MG tablet Commonly known as: ZOCOR       TAKE these medications    acetaminophen 325 MG tablet Commonly known as: TYLENOL Take 325 mg by mouth every 6 (six) hours as needed for mild pain, fever, headache or moderate pain.   amitriptyline 25 MG tablet Commonly known as: ELAVIL TAKE 1 TABLET BY MOUTH DAILY   atorvastatin 10 MG tablet Commonly known as: Lipitor Take 1 tablet (10 mg total) by mouth daily.   BIOTIN PO Take 1 tablet by  mouth daily.   diltiazem 180 MG 24 hr capsule Commonly known as: CARDIZEM CD Take 1 capsule (180 mg total) by mouth daily. What changed:  medication strength how much to take   Eliquis 5 MG Tabs tablet Generic drug: apixaban TAKE 1 TABLET(5 MG) BY MOUTH TWICE DAILY What changed: See the new instructions.   furosemide 20 MG tablet Commonly known as: LASIX TAKE 1 TABLET(20 MG) BY MOUTH DAILY What changed: See the new instructions.   levothyroxine 125 MCG tablet Commonly known as: SYNTHROID TAKE 1 TABLET(125 MCG) BY MOUTH DAILY What changed: See the new instructions.   LORazepam 0.5 MG tablet Commonly known as: ATIVAN Take 1 tablet by mouth every 12 (twelve) hours as needed for anxiety.   losartan 50 MG tablet Commonly known as: COZAAR Take 1 tablet (50 mg total) by mouth daily.   melatonin 5 MG Tabs Take 1 tablet (5 mg total) by mouth at bedtime.   multivitamin capsule Take 1 capsule by mouth daily.   omeprazole 20 MG capsule Commonly known as: PRILOSEC TAKE 1 CAPSULE BY MOUTH DAILY   pentosan polysulfate 100 MG capsule Commonly known as: ELMIRON Take 100 mg by mouth 2 (two) times daily.   psyllium 28 % packet Commonly known as: METAMUCIL SMOOTH TEXTURE Take 1 packet by mouth daily at 8 pm.   Salonpas 3.08-20-08 % Ptch Generic drug: Camphor-Menthol-Methyl Sal Place 1 patch onto the skin daily as needed (back pain).   verapamil 40 MG tablet Commonly known as: CALAN Take 1 tablet (40 mg) by mouth every 4 hours up to 3 doses as needed for recurrent tachycardia         Follow-up Information     Iran Ouch, MD. Go in 1 week(s).   Specialty: Cardiology Why: Appointment on Tuesday, 07/05/2022 at 8:25am with Frutoso Schatz, NP. Contact information: [address] [place] [address] [phone]                 Allergies  Allergen Reactions   Erythromycin Other (See Comments)   Amoxicillin Diarrhea   Penicillins Rash     Did it involve swelling of the face/tongue/throat, SOB, or low BP? No Did it involve sudden or severe rash/hives, skin peeling, or any reaction on the inside of your mouth or nose? No Did you need to seek medical attention at a hospital or doctor's office? Yes When did it last happen?      65 years ago If all above answers are "NO", may proceed with cephalosporin use.     The results of significant diagnostics from this hospitalization (including imaging, microbiology, ancillary and laboratory) are listed below for reference.   Consultations:   Procedures/Studies: DG Chest Port 1 View  Result Date: 06/27/2022 CLINICAL DATA:  Chest pain EXAM: PORTABLE CHEST 1 VIEW COMPARISON:  January 26, 2022 FINDINGS: There is a dual lead AICD with stable lead wire positioning. The heart size and mediastinal contours are within  normal limits. Mild, chronic appearing increased lung markings are seen. There is no evidence of an acute infiltrate, pleural effusion or pneumothorax. The visualized skeletal structures are unremarkable. IMPRESSION: Chronic appearing increased lung markings without evidence of acute or active cardiopulmonary disease. Electronically Signed   By: Aram Candela M.D.   On: 06/27/2022 01:14      Labs: BNP (last 3 results) Recent Labs    08/23/21 1149 12/28/21 1602  BNP 292.7* 111.7*   Basic Metabolic Panel: Recent Labs  Lab 06/27/22 0052 06/28/22 0846  NA 140 140  K 4.7 4.2  CL 108 109  CO2 24 25  GLUCOSE 124* 107*  BUN 19 15  CREATININE 0.71 0.72  CALCIUM 9.3 9.4  MG  --  2.1   Liver Function Tests: No results for input(s): "AST", "ALT", "ALKPHOS", "BILITOT", "PROT", "ALBUMIN" in the last 168 hours. No results for input(s): "LIPASE", "AMYLASE" in the last 168 hours. No results for input(s): "AMMONIA" in the last 168 hours. CBC: Recent Labs  Lab 06/27/22 0052 06/28/22 0846  WBC 7.4 6.3  NEUTROABS 3.4  --   HGB 12.9 11.8*  HCT 38.4 34.9*  MCV 91.4 90.4   PLT 224 209   Cardiac Enzymes: No results for input(s): "CKTOTAL", "CKMB", "CKMBINDEX", "TROPONINI" in the last 168 hours. BNP: Invalid input(s): "POCBNP" CBG: No results for input(s): "GLUCAP" in the last 168 hours. D-Dimer No results for input(s): "DDIMER" in the last 72 hours. Hgb A1c No results for input(s): "HGBA1C" in the last 72 hours. Lipid Profile No results for input(s): "CHOL", "HDL", "LDLCALC", "TRIG", "CHOLHDL", "LDLDIRECT" in the last 72 hours. Thyroid function studies Recent Labs    06/27/22 0052  TSH 0.488   Anemia work up No results for input(s): "VITAMINB12", "FOLATE", "FERRITIN", "TIBC", "IRON", "RETICCTPCT" in the last 72 hours. Urinalysis    Component Value Date/Time   COLORURINE STRAW (A) 12/29/2021 0017   APPEARANCEUR CLEAR (A) 12/29/2021 0017   LABSPEC 1.006 12/29/2021 0017   PHURINE 5.0 12/29/2021 0017   GLUCOSEU NEGATIVE 12/29/2021 0017   HGBUR MODERATE (A) 12/29/2021 0017   BILIRUBINUR NEGATIVE 12/29/2021 0017   KETONESUR NEGATIVE 12/29/2021 0017   PROTEINUR NEGATIVE 12/29/2021 0017   NITRITE NEGATIVE 12/29/2021 0017   LEUKOCYTESUR NEGATIVE 12/29/2021 0017   Sepsis Labs Recent Labs  Lab 06/27/22 0052 06/28/22 0846  WBC 7.4 6.3   Microbiology No results found for this or any previous visit (from the past 240 hour(s)).   Total time spend on discharging this patient, including the last patient exam, discussing the hospital stay, instructions for ongoing care as it relates to all pertinent caregivers, as well as preparing the medical discharge records, prescriptions, and/or referrals as applicable, is 35 minutes.    Darlin Priestly, MD  [facility] 06/28/2022, 6:59 PM

## 2022-06-28 NOTE — Progress Notes (Signed)
PHARMACIST - PHYSICIAN ORDER COMMUNICATION  CONCERNING: Diltiazem and Simvastatin and risk of rhabdomyolysis  DESCRIPTION:  Patients on diltiazem and simvastatin >10 mg/day have reported cases of rhabdomyolysis. Pharmacy is to assess simvastatin dose. If >10 mg, substitute atorvastatin (Lipitor) 1mg  for each 2mg  simvastatin.  This patient is ordered simvastatin 20 mg and diltiazem 180 mg.    ACTION TAKEN: Patient's order for simvastatin has been discontinued and replaced it with Atorvastatin 10 mg per discussion with MD and patient.   , PharmD PGY1 Pharmacy Resident 06/28/2022 7:07 AM

## 2022-06-28 NOTE — Progress Notes (Signed)
Rounding Note    Patient Name: Meagan Wilcox Date of Encounter: 06/28/2022  [facility] Cardiologist: Yvonne Kendall, MD   Subjective   Patient seen on AM rounds. Denies any chest pain, shortness of breath, or palpitations. Remains a-paced on telemetry.  Inpatient Medications    Scheduled Meds:  apixaban  5 mg Oral BID   diltiazem  180 mg Oral Daily   levothyroxine  125 mcg Oral Q0600   simvastatin  20 mg Oral Daily   Continuous Infusions:  PRN Meds: acetaminophen **OR** acetaminophen, LORazepam, ondansetron **OR** ondansetron (ZOFRAN) IV, senna-docusate   Vital Signs    Vitals:   06/27/22 2300 06/28/22 0354 06/28/22 0629 06/28/22 0759  BP:  (!) 119/41  130/67  Pulse:  67  67  Resp: 17 18  17   Temp:  98.2 F (36.8 C)  98.2 F (36.8 C)  TempSrc:      SpO2:  92%  93%  Weight:   67.9 kg   Height:       No intake or output data in the 24 hours ending 06/28/22 1048    06/28/2022    6:29 AM 06/27/2022   12:59 AM 05/25/2022   10:22 AM  Last 3 Weights  Weight (lbs) 149 lb 11.1 oz 158 lb 151 lb 9.6 oz  Weight (kg) 67.9 kg 71.668 kg 68.765 kg      Telemetry    A-paced rate of 66 - Personally Reviewed  ECG    No new tracings - Personally Reviewed  Physical Exam   GEN: No acute distress.   Neck: No JVD Cardiac: RRR, no murmurs, rubs, or gallops.  Respiratory: Clear to auscultation bilaterally.Respirations are unlabored at rest on room air. GI: Soft, nontender, non-distended  MS: No edema; No deformity. Neuro:  Nonfocal  Psych: Normal affect   Labs    High Sensitivity Troponin:   Recent Labs  Lab 06/27/22 0052 06/27/22 0356  TROPONINIHS 7 11     Chemistry Recent Labs  Lab 06/27/22 0052 06/28/22 0846  NA 140 140  K 4.7 4.2  CL 108 109  CO2 24 25  GLUCOSE 124* 107*  BUN 19 15  CREATININE 0.71 0.72  CALCIUM 9.3 9.4  MG  --  2.1  GFRNONAA >60 >60  ANIONGAP 8 6    Lipids No results for input(s): "CHOL", "TRIG", "HDL",  "LABVLDL", "LDLCALC", "CHOLHDL" in the last 168 hours.  Hematology Recent Labs  Lab 06/27/22 0052 06/28/22 0846  WBC 7.4 6.3  RBC 4.20 3.86*  HGB 12.9 11.8*  HCT 38.4 34.9*  MCV 91.4 90.4  MCH 30.7 30.6  MCHC 33.6 33.8  RDW 13.5 13.7  PLT 224 209   Thyroid  Recent Labs  Lab 06/27/22 0052  TSH 0.488  FREET4 1.15*    BNPNo results for input(s): "BNP", "PROBNP" in the last 168 hours.  DDimer No results for input(s): "DDIMER" in the last 168 hours.   Radiology    DG Chest Port 1 View  Result Date: 06/27/2022 CLINICAL DATA:  Chest pain EXAM: PORTABLE CHEST 1 VIEW COMPARISON:  January 26, 2022 FINDINGS: There is a dual lead AICD with stable lead wire positioning. The heart size and mediastinal contours are within normal limits. Mild, chronic appearing increased lung markings are seen. There is no evidence of an acute infiltrate, pleural effusion or pneumothorax. The visualized skeletal structures are unremarkable. IMPRESSION: Chronic appearing increased lung markings without evidence of acute or active cardiopulmonary disease. Electronically Signed   By:  Aram Candela M.D.   On: 06/27/2022 01:14    Cardiac Studies   TTE 02/04/21 1. Left ventricular ejection fraction, by estimation, is 55 to 60%. The  left ventricle has normal function. The left ventricle has no regional  wall motion abnormalities. There is mild left ventricular hypertrophy.  Left ventricular diastolic parameters  are consistent with Grade II diastolic dysfunction (pseudonormalization).   2. Right ventricular systolic function is normal. The right ventricular  size is mildly enlarged. There is moderately elevated pulmonary artery  systolic pressure.   3. Right atrial size was mildly dilated.   4. The mitral valve is normal in structure. Mild mitral valve  regurgitation.   5. Tricuspid valve regurgitation is mild to moderate.   6. The aortic valve was not well visualized. Aortic valve regurgitation  is not  visualized.   7. The inferior vena cava is normal in size with greater than 50%  respiratory variability, suggesting right atrial pressure of 3 mmHg.     Patient Profile     88 y.o. female with a history of paroxysmal atrial fibrillation/flutter complicated bby bradycardia s/p dual-chamber PPM (01/2022), chronic HFpEF, hypertension, hyperlipidemia, hypothyroidism, chronic fatigue, who is being seen and evaluated for atrial fibrillation RVR.   Assessment & Plan    Atrial fibrillation RVR -found to be A-fib RVR rate of 150 by EMS after taking as needed verapamil dose -placed on diltiazem drip in the emergency department and shortly after converted to sinus with drip discontinued  -remained in sinus or a-paced throughout the night on telemetry -continue diltiazem 180 mg daily -continue apixaban 5 mg twice daily -resume as needed verapamil on discharge  HFpEF -LVEF 55-60% -euvolemic on exam -restart PTA losartan and furosemide -daily weight, I&O, low sodium diet  Essential hypertension -blood pressure 130/67 -continue diltiazem  -PTA medications remain on hold -vitals per un it protocol  Hyperlipidemia -previously had been on simvastatin -statin changed to atorvastatin per pharmacy recommendations due to potential drug-drug interactions with simvastatin and diltiazem -recommend lipid and hepatic panel in 8-10 weeks after starting new statin therapy     For questions or updates, please contact [facility] Please consult [email or web address] for contact info under        Signed, Terrel Nesheiwat, NP  06/28/2022, 10:48 AM

## 2022-07-05 ENCOUNTER — Ambulatory Visit: Payer: Medicare PPO | Admitting: Cardiology

## 2022-07-06 ENCOUNTER — Encounter: Payer: Self-pay | Admitting: Cardiology

## 2022-07-06 ENCOUNTER — Ambulatory Visit: Payer: Medicare PPO | Attending: Cardiology | Admitting: Cardiology

## 2022-07-06 VITALS — BP 132/54 | HR 65 | Ht 63.0 in | Wt 152.0 lb

## 2022-07-06 DIAGNOSIS — I5032 Chronic diastolic (congestive) heart failure: Secondary | ICD-10-CM

## 2022-07-06 DIAGNOSIS — I480 Paroxysmal atrial fibrillation: Secondary | ICD-10-CM | POA: Diagnosis not present

## 2022-07-06 DIAGNOSIS — I4819 Other persistent atrial fibrillation: Secondary | ICD-10-CM | POA: Diagnosis not present

## 2022-07-06 DIAGNOSIS — I10 Essential (primary) hypertension: Secondary | ICD-10-CM

## 2022-07-06 DIAGNOSIS — E785 Hyperlipidemia, unspecified: Secondary | ICD-10-CM | POA: Diagnosis not present

## 2022-07-06 DIAGNOSIS — Z950 Presence of cardiac pacemaker: Secondary | ICD-10-CM | POA: Diagnosis not present

## 2022-07-06 DIAGNOSIS — E782 Mixed hyperlipidemia: Secondary | ICD-10-CM

## 2022-07-06 DIAGNOSIS — E039 Hypothyroidism, unspecified: Secondary | ICD-10-CM

## 2022-07-06 MED ORDER — ROSUVASTATIN CALCIUM 5 MG PO TABS: 5.0000 mg | ORAL_TABLET | Freq: Every day | ORAL | 0 refills | Status: DC

## 2022-07-06 NOTE — Progress Notes (Addendum)
Cardiology Clinic Note   Patient Name: Meagan Wilcox Date of Encounter: 07/06/2022  Primary Care Provider:  Kirk Ruths, MD Primary Cardiologist:  Meagan Bush, MD  Patient Profile    88 year old female with a past medical history of paroxysmal atrial fibrillation/flutter complicated by bradycardia, status post dual-chamber pacemaker (01/2022), chronic HFpEF with an EF of 55 to 60%, hypertension, hyperlipidemia, hypothyroidism, chronic fatigue, who is being seen today for hospital follow-up.  Past Medical History    Past Medical History:  Diagnosis Date   (HFpEF) heart failure with preserved ejection fraction ([place])    a. 01/2021 Echo: EF 55-60%, no rwma, GrII DD, nl RV fxn, mod elev PASP. Mild MR, mild-mod TR.   Abnormal mammogram, unspecified 2011   Arthritis 2002   Chronic pain syndrome 08/01/2017   GERD (gastroesophageal reflux disease)    Heart murmur 1985   Hyperlipidemia    Hypertension 2005   Hypothyroidism    Persistent atrial fibrillation/Atypical atrial flutter ([place])    a. 04/2019 TEE/DCCV; b. 05/2019 Recurrent Aflutter-->Amio-->DCCV; c. CHA2DS2VASc = 4-->Eliquis; d. 08/2021 recurrent Afib req DCCV.  Flecainide started; e. 09/2021 Flecainide d/c'd 2/2 brady - couldn't tol bb.   PSVT (paroxysmal supraventricular tachycardia)    a. 09/2021 Zio: Predominantly sinus rhythm (67; range 49-102).  Rare PACs with occasional PVCs (PVC burden 3.6%).  44 SVT runs-fastest 190, longest 12.9 seconds.  No sustained arrhythmias/prolonged pauses.  No significant arrhythmia identified corresponding to patient's symptoms.   Past Surgical History:  Procedure Laterality Date   APPENDECTOMY  1939   BREAST BIOPSY Left 02-24-14   benign   BREAST BIOPSY Left 02/11/2016   neg   BREAST BIOPSY Left 2013   neg   BREAST EXCISIONAL BIOPSY Left 2014   neg   BREAST SURGERY Left 2013   stereotactic biopsy    BREAST SURGERY Left May 2010   1 mm foci of atypical ductal hyperplasia    CARDIOVERSION N/A 05/08/2019   Procedure: CARDIOVERSION;  Surgeon: Kate Sable, MD;  Location: [facility];  Service: Cardiovascular;  Laterality: N/A;   CARDIOVERSION N/A 05/31/2019   Procedure: CARDIOVERSION;  Surgeon: Minna Merritts, MD;  Location: [facility];  Service: Cardiovascular;  Laterality: N/A;   CARDIOVERSION N/A 08/25/2021   Procedure: CARDIOVERSION;  Surgeon: Kate Sable, MD;  Location: [facility];  Service: Cardiovascular;  Laterality: N/A;   CARDIOVERSION N/A 12/29/2021   Procedure: CARDIOVERSION;  Surgeon: Kate Sable, MD;  Location: [facility];  Service: Cardiovascular;  Laterality: N/A;   cataract surgery  Bilateral 2009   COLONOSCOPY  2010   Dr. Vira Agar   left breast biopsy   2009   PACEMAKER IMPLANT N/A 01/26/2022   Procedure: PACEMAKER IMPLANT;  Surgeon: Deboraha Sprang, MD;  Location: [facility];  Service: Cardiovascular;  Laterality: N/A;   TEE WITHOUT CARDIOVERSION N/A 05/08/2019   Procedure: TRANSESOPHAGEAL ECHOCARDIOGRAM (TEE);  Surgeon: Kate Sable, MD;  Location: [facility];  Service: Cardiovascular;  Laterality: N/A;    Allergies  Allergies  Allergen Reactions   Erythromycin Other (See Comments)   Other Other (See Comments)   Amoxicillin Diarrhea   Penicillins Rash    Did it involve swelling of the face/tongue/throat, SOB, or low BP? No Did it involve sudden or severe rash/hives, skin peeling, or any reaction on the inside of your mouth or nose? No Did you need to seek medical attention at a hospital or doctor's office? Yes When did it last happen?  65 years ago If all above answers are "NO", may proceed with cephalosporin use.    History of Present Illness    Meagan Wilcox is an 88 year old female with past medical history of paroxysmal atrial fibrillation/flutter complicated by bradycardia, status post dual-chamber pacemaker placement (01/2022), chronic HFpEF with an EF of 55-60%, hypertension, hyperlipidemia,  hypothyroidism, and chronic fatigue.  In September 2020 she was diagnosed with atrial flutter/atypical atrial flutter, TEE guided cardioversion.  She had recurrent tachypalpitations in October 2020 was found to be in a flutter with 2 1 conduction.  She was placed on amiodarone and underwent repeat cardioversion October 2020.  Metered dose was reduced to 200 mg daily in November 2020 in the setting of nausea and fatigue.  He was further reduced to 100 mg daily in June 2021.  May 2022 she experienced dyspnea and fatigue as well as bradycardia on home monitoring.  Formal event monitoring showed an average heart rate of 55 bpm with a 5.1% PVC burden, 10 brief atrial runs, and no evidence of recurrent atrial fibs/flutter.  Echocardiogram at that time revealed EF of 55-60%, G2 DD, no regional wall motion abnormalities, normal LV function, moderately elevated PASP, mild MR, and mild to moderate TR.  Amiodarone was subsequently discontinued in July 2020 in the setting frequent nausea and dizziness.  In January 2023 in the setting of fatigue and palpitations she presented to [facility] and was found to be in recurrent atrial flutter with rates of 160s.  She had been seen by EP and was placed on flecainide 50 mg twice daily as well as nadolol.  TEE and cardioversion were carried out and she was noted to be bradycardic post cardioversion.  Nadolol was then subsequently discontinued.  In February 2023 she was noted to be bradycardic and beta-blocker flecainide with both discontinued.  Repeat ZIO monitor showed no atrial fibrillation or atrial flutter but she had 44 brief runs of atrial tachycardia.  In April 2023 she had repeated episodes of recurrent tachycardia and she was placed on verapamil 40 mg every 4 hours x 3 doses as needed for breakthrough tachycardia.  It was felt she might require backup bradycardia pacing with resumption of AV nodal blocking agents versus monitoring to see if they would have been appropriate for an  antiarrhythmic.  Pacemaker was placed on 01/26/2022.  She was last followed up by EP in 04/2022 with complaints of worsening fatigue and severe weakness.  Dr. Caryl Comes had held her diltiazem to see if it would help her symptoms at that time.  In 05/25/2022 she saw Dr. Saunders Revel where she reported some fatigue and the discontinuation of her diltiazem did not make her feel any better.  So she was restarted on diltiazem 120 mg daily and continued on apixaban 5 mg twice daily as well as her as needed verapamil.  She presented to the [facility] emergency department 06/27/2022 with heart palpitations woke her from sleep.  EMS arrived and she was found to be in atrial fibrillation with RVR with rate of 150s but was normotensive.  Initial vitals showed blood pressure 141/85, pulse of 91, respirations of 15, SPO2 of 98%.  Pertinent blood work revealed blood glucose of 124, TSH 0.488, free T4 of 1.15, high-sensitivity troponin of 7 and 11.  Imaging chest x-ray revealed chronic.  Increased lung markings without evidence of acute or active cardiopulmonary disease.  She was subsequently placed on a Cardizem drip after giving multiple Cardizem boluses IVP.  After being placed on Cardizem she converted  back to sinus and the drip was discontinued and she was noted to remain in sinus or a paced throughout the night on telemetry.  She was discharged on 06/28/2022  She returns clinic today for hospital follow-up.  She is accompanied by family member today.  The questions related to discharge medications from the hospital.  They did understand that her diltiazem was increased in dosage and they have been doing that but there was questions today whether she was on atorvastatin 10 mg versus simvastatin 20 mg.  After further discussion it was found that due to the drug to drug interaction between diltiazem and simvastatin simvastatin was changed to atorvastatin during her hospital stay.  Unfortunately patient stated that 20 years prior she had adverse  reactions to atorvastatin and is not opposed to changing her statin medication but would prefer to take something other than the atorvastatin.  She endorses occasional shortness of breath and palpitations.  She says that she has noted as of recently that she can actually feel her heart beating whereas prior to her hospitalization she did not have that feeling.  She also endorses some continued peripheral edema with the left leg being a little bit more swollen than her right leg which she says is a chronic finding.  She denies chest pain, lightheadedness, dizziness, syncope/presyncope.  Home Medications    Current Outpatient Medications  Medication Sig Dispense Refill   acetaminophen (TYLENOL) 325 MG tablet Take 325 mg by mouth every 6 (six) hours as needed for mild pain, fever, headache or moderate pain.     amitriptyline (ELAVIL) 25 MG tablet TAKE 1 TABLET BY MOUTH DAILY 90 tablet 3   apixaban (ELIQUIS) 5 MG TABS tablet TAKE 1 TABLET(5 MG) BY MOUTH TWICE DAILY (Patient taking differently: Take 5 mg by mouth 2 (two) times daily.) 180 tablet 2   BIOTIN PO Take 1 tablet by mouth daily.     Camphor-Menthol-Methyl Sal (SALONPAS) 3.08-20-08 % PTCH Place 1 patch onto the skin daily as needed (back pain).     diltiazem (CARDIZEM CD) 180 MG 24 hr capsule Take 1 capsule (180 mg total) by mouth daily. 30 capsule 2   furosemide (LASIX) 20 MG tablet TAKE 1 TABLET(20 MG) BY MOUTH DAILY (Patient taking differently: Take 20 mg by mouth daily.) 90 tablet 0   levothyroxine (SYNTHROID) 125 MCG tablet TAKE 1 TABLET(125 MCG) BY MOUTH DAILY (Patient taking differently: Take 125 mcg by mouth daily before breakfast.) 90 tablet 3   LORazepam (ATIVAN) 0.5 MG tablet Take 1 tablet by mouth every 12 (twelve) hours as needed for anxiety.     losartan (COZAAR) 50 MG tablet Take 1 tablet (50 mg total) by mouth daily. 90 tablet 3   melatonin 5 MG TABS Take 1 tablet (5 mg total) by mouth at bedtime.  0   Multiple Vitamin  (MULTIVITAMIN) capsule Take 1 capsule by mouth daily.     omeprazole (PRILOSEC) 20 MG capsule TAKE 1 CAPSULE BY MOUTH DAILY 90 capsule 3   pentosan polysulfate (ELMIRON) 100 MG capsule Take 100 mg by mouth 2 (two) times daily.     psyllium (METAMUCIL SMOOTH TEXTURE) 28 % packet Take 1 packet by mouth daily at 8 pm.     rosuvastatin (CRESTOR) 5 MG tablet Take 1 tablet (5 mg total) by mouth daily. 90 tablet 0   simvastatin (ZOCOR) 20 MG tablet Take 20 mg by mouth daily.     verapamil (CALAN) 40 MG tablet Take 1 tablet (40 mg) by  mouth every 4 hours up to 3 doses as needed for recurrent tachycardia 30 tablet 0   No current facility-administered medications for this visit.     Family History    Family History  Problem Relation Age of Onset   Heart disease Mother    Heart disease Father    Diabetes Father    Stroke Father    Arthritis Father    She indicated that her mother is deceased. She indicated that her father is deceased.  Social History    Social History   Socioeconomic History   Marital status: Single    Spouse name: Not on file   Number of children: Not on file   Years of education: Not on file   Highest education level: Not on file  Occupational History   Not on file  Tobacco Use   Smoking status: Never   Smokeless tobacco: Never  Vaping Use   Vaping Use: Never used  Substance and Sexual Activity   Alcohol use: No    Alcohol/week: 0.0 standard drinks of alcohol   Drug use: No   Sexual activity: Not on file  Other Topics Concern   Not on file  Social History Narrative   Not on file   Social Determinants of Health   Financial Resource Strain: Not on file  Food Insecurity: No Food Insecurity (06/27/2022)   Hunger Vital Sign    Worried About Running Out of Food in the Last Year: Never true    Ran Out of Food in the Last Year: Never true  Transportation Needs: No Transportation Needs (06/27/2022)   PRAPARE - Administrator, Civil Service  (Medical): No    Lack of Transportation (Non-Medical): No  Physical Activity: Not on file  Stress: Not on file  Social Connections: Not on file  Intimate Partner Violence: Not At Risk (06/27/2022)   Humiliation, Afraid, Rape, and Kick questionnaire    Fear of Current or Ex-Partner: No    Emotionally Abused: No    Physically Abused: No    Sexually Abused: No     Review of Systems    General:  No chills, fever, night sweats or weight changes.  Endorses fatigue Cardiovascular:  No chest pain, endorses occasional dyspnea on exertion, endorses peripheral edema, orthopnea, endorses occasional palpitations, paroxysmal nocturnal dyspnea. Dermatological: No rash, lesions/masses Respiratory: No cough, endorses occasional dyspnea Urologic: No hematuria, dysuria Abdominal:   No nausea, vomiting, diarrhea, bright red blood per rectum, melena, or hematemesis Neurologic:  No visual changes, wkns, changes in mental status. All other systems reviewed and are otherwise negative except as noted above.   Physical Exam    VS:  BP (!) 132/54 (BP Location: Left Arm, Patient Position: Sitting, Cuff Size: Normal)   Pulse 65   Ht 5\' 3"  (1.6 m)   Wt 152 lb (68.9 kg)   SpO2 96%   BMI 26.93 kg/m  , BMI Body mass index is 26.93 kg/m.     GEN: Well nourished, well developed, in no acute distress. HEENT: normal.  Glasses on Neck: Supple, no JVD, carotid bruits, or masses. Cardiac: RRR, no murmurs, rubs, or gallops. No clubbing, cyanosis, edema.  Radials/DP/PT 2+ and equal bilaterally.  Respiratory:  Respirations regular and unlabored, clear to auscultation bilaterally. GI: Soft, nontender, nondistended, BS + x 4. MS: no deformity or atrophy. Skin: warm and dry, no rash. Neuro:  Strength and sensation are intact. Psych: Normal affect.  Accessory Clinical Findings    ECG personally  reviewed by me today-a paced with LVH and a left axis deviation- No acute changes  Lab Results  Component Value Date    WBC 6.3 06/28/2022   HGB 11.8 (L) 06/28/2022   HCT 34.9 (L) 06/28/2022   MCV 90.4 06/28/2022   PLT 209 06/28/2022   Lab Results  Component Value Date   CREATININE 0.72 06/28/2022   BUN 15 06/28/2022   NA 140 06/28/2022   K 4.2 06/28/2022   CL 109 06/28/2022   CO2 25 06/28/2022   Lab Results  Component Value Date   ALT 15 08/29/2021   AST 17 08/29/2021   ALKPHOS 51 08/29/2021   BILITOT 0.3 08/29/2021   No results found for: "CHOL", "HDL", "LDLCALC", "LDLDIRECT", "TRIG", "CHOLHDL"  No results found for: "HGBA1C"  Assessment & Plan   1.  Persistent atrial fibrillation/atrial flutter with recent atrial fibrillation RVR requiring hospitalization.  She was given boluses of diltiazem and started on a diltiazem drip and shortly converted out of A-fib RVR once back into in a paced rhythm.  Her home diltiazem dose was increased to 180 mg daily on discharge.  Since that she has been discharged from the hospital she has had few complaints of palpitations and is hyperaware of her heart beating.  EKG today reveals that she remains to be a paced and she has been keeping a log of her blood pressures and pulses at home and her heart rate has not been out of the 70s since discharge.  So she has been continued on apixaban 5 mg twice daily, diltiazem 180 mg daily, and verapamil 40 mg as needed.  2.  Chronic HFpEF which she appears euvolemic on exam.  She has occasional complaints of shortness of breath and fatigue with NYHA class III heart failure.  And she is continued on furosemide 20 mg daily.  She does continue to wear compression stockings for peripheral edema which has been stable and unchanged even through her hospitalizations.  She continues to have complaints of shortness of breath and fatigue we will order repeat echocardiogram we will follow-up.  3.  Hypertension with blood pressure today of 132/54.  She has been continued on losartan 50 mg daily, diltiazem 180 mg daily, and as needed  verapamil.  She has been encouraged to continue to monitor her pressure at home.  4.  Hyperlipidemia with drug to drug interaction noted during hospitalization between increased dose of diltiazem and current simvastatin that she was taking.  During her hospitalization simvastatin was discontinued and atorvastatin was administered. Atorvastatin was prescribed on discharge but was not at the pharmacy for pick-up so on return the patient had never started atorvastatin and continued on her PTA simvastatin, but after further discussion with the patient she had adverse effects to atorvastatin approximately 20 years prior and is not interested in retrying atorvastatin at this time.  So she has been started on rosuvastatin 5 mg daily with a lipid panel and hepatic panel in 3 months.  5.  She has a cardiac pacemaker that was inserted on 01/26/2022.  She has a paced on EKG today.  She is to continue with follow-up with Dr. Caryl Comes as well as her remote downloads.  6.  Hypothyroidism which she is continued on levothyroxine and this continues to be managed by her PCP.  7.  Disposition patient is return to clinic to see MD/APP in 3 months or sooner if needed.  Dewana Ammirati, NP 07/06/2022, 11:41 AM

## 2022-07-06 NOTE — Patient Instructions (Signed)
Medication Instructions:  START rosuvastatin 5 mg daily.  *If you need a refill on your cardiac medications before your next appointment, please call your pharmacy*   Lab Work: Lipid and hepatic (liver) panel to be drawn prior to your follow-up appointment in 3 months. - Please go to the [facility]. You will check in at the front desk to the right as you walk into the atrium. Valet Parking is offered if needed. - No appointment needed. You may go any day between 7 am and 6 pm.   If you have labs (blood work) drawn today and your tests are completely normal, you will receive your results only by: MyChart Message (if you have MyChart) OR A paper copy in the mail If you have any lab test that is abnormal or we need to change your treatment, we will call you to review the results.   Testing/Procedures: None ordered  Follow-Up: At [facility], you and your health needs are our priority.  As part of our continuing mission to provide you with exceptional heart care, we have created designated Provider Care Teams.  These Care Teams include your primary Cardiologist (physician) and Advanced Practice Providers (APPs -  Physician Assistants and Nurse Practitioners) who all work together to provide you with the care you need, when you need it.  We recommend signing up for the patient portal called "MyChart".  Sign up information is provided on this After Visit Summary.  MyChart is used to connect with patients for Virtual Visits (Telemedicine).  Patients are able to view lab/test results, encounter notes, upcoming appointments, etc.  Non-urgent messages can be sent to your provider as well.   To learn more about what you can do with MyChart, go to [email or web address].    Your next appointment:   3 month(s)  The format for your next appointment:   In Person  Provider:   You may see Yvonne Kendall, MD or one of the following Advanced Practice Providers on your designated Care  Team:   Nicolasa Ducking, NP Eula Listen, PA-C Cadence Fransico Michael, PA-C Charlsie Quest, NP   Important Information About Sugar

## 2022-07-12 ENCOUNTER — Observation Stay
Admission: EM | Admit: 2022-07-12 | Discharge: 2022-07-12 | Disposition: A | Discharge status: Home / Self Care | Payer: Medicare PPO | Attending: Hospitalist | Admitting: Hospitalist

## 2022-07-12 ENCOUNTER — Emergency Department: Payer: Medicare PPO

## 2022-07-12 ENCOUNTER — Encounter: Payer: Self-pay | Admitting: Anesthesiology

## 2022-07-12 ENCOUNTER — Encounter
Admission: EM | Disposition: A | Discharge status: Home / Self Care | Payer: Self-pay | Source: Home / Self Care | Attending: Emergency Medicine

## 2022-07-12 ENCOUNTER — Encounter: Payer: Self-pay | Admitting: Emergency Medicine

## 2022-07-12 DIAGNOSIS — I110 Hypertensive heart disease with heart failure: Secondary | ICD-10-CM | POA: Diagnosis not present

## 2022-07-12 DIAGNOSIS — Z79899 Other long term (current) drug therapy: Secondary | ICD-10-CM | POA: Insufficient documentation

## 2022-07-12 DIAGNOSIS — I4891 Unspecified atrial fibrillation: Secondary | ICD-10-CM | POA: Diagnosis not present

## 2022-07-12 DIAGNOSIS — R002 Palpitations: Secondary | ICD-10-CM | POA: Diagnosis present

## 2022-07-12 DIAGNOSIS — Z7901 Long term (current) use of anticoagulants: Secondary | ICD-10-CM | POA: Insufficient documentation

## 2022-07-12 DIAGNOSIS — Z950 Presence of cardiac pacemaker: Secondary | ICD-10-CM | POA: Insufficient documentation

## 2022-07-12 DIAGNOSIS — E039 Hypothyroidism, unspecified: Secondary | ICD-10-CM | POA: Insufficient documentation

## 2022-07-12 DIAGNOSIS — I480 Paroxysmal atrial fibrillation: Secondary | ICD-10-CM

## 2022-07-12 DIAGNOSIS — I471 Supraventricular tachycardia, unspecified: Secondary | ICD-10-CM | POA: Diagnosis not present

## 2022-07-12 DIAGNOSIS — I5032 Chronic diastolic (congestive) heart failure: Secondary | ICD-10-CM | POA: Diagnosis not present

## 2022-07-12 DIAGNOSIS — I10 Essential (primary) hypertension: Secondary | ICD-10-CM | POA: Diagnosis present

## 2022-07-12 LAB — CBC
HCT: 37.5 % (ref 36.0–46.0)
Hemoglobin: 12.5 g/dL (ref 12.0–15.0)
MCH: 30.3 pg (ref 26.0–34.0)
MCHC: 33.3 g/dL (ref 30.0–36.0)
MCV: 90.8 fL (ref 80.0–100.0)
Platelets: 222 10*3/uL (ref 150–400)
RBC: 4.13 MIL/uL (ref 3.87–5.11)
RDW: 13.4 % (ref 11.5–15.5)
WBC: 8.2 10*3/uL (ref 4.0–10.5)
nRBC: 0 % (ref 0.0–0.2)

## 2022-07-12 LAB — TROPONIN I (HIGH SENSITIVITY)
Troponin I (High Sensitivity): 10 ng/L (ref <18)
Troponin I (High Sensitivity): 12 ng/L (ref <18)

## 2022-07-12 LAB — MAGNESIUM: Magnesium: 2.1 mg/dL (ref 1.7–2.4)

## 2022-07-12 LAB — BASIC METABOLIC PANEL
Anion gap: 8 (ref 5–15)
BUN: 19 mg/dL (ref 8–23)
CO2: 25 mmol/L (ref 22–32)
Calcium: 9.7 mg/dL (ref 8.9–10.3)
Chloride: 106 mmol/L (ref 98–111)
Creatinine, Ser: 0.8 mg/dL (ref 0.44–1.00)
GFR, Estimated: >60 mL/min (ref >60)
Glucose, Bld: 128 mg/dL — ABNORMAL HIGH (ref 70–99)
Potassium: 3.8 mmol/L (ref 3.5–5.1)
Sodium: 139 mmol/L (ref 135–145)

## 2022-07-12 LAB — TSH: TSH: 0.645 u[IU]/mL (ref 0.350–4.500)

## 2022-07-12 LAB — BRAIN NATRIURETIC PEPTIDE: B Natriuretic Peptide: 99.4 pg/mL (ref 0.0–100.0)

## 2022-07-12 SURGERY — CARDIOVERSION
Anesthesia: General

## 2022-07-12 MED ORDER — ROSUVASTATIN CALCIUM 5 MG PO TABS
5.0000 mg | ORAL_TABLET | Freq: Every day | ORAL | Status: DC
  Administered 2022-07-12: 5 mg via ORAL
  Filled 2022-07-12: qty 1

## 2022-07-12 MED ORDER — LEVOTHYROXINE SODIUM 50 MCG PO TABS
125.0000 ug | ORAL_TABLET | Freq: Every day | ORAL | Status: DC
  Administered 2022-07-12: 125 ug via ORAL
  Filled 2022-07-12: qty 3

## 2022-07-12 MED ORDER — MELATONIN 5 MG PO TABS: 5.0000 mg | ORAL_TABLET | Freq: Every evening | ORAL | Status: AC | PRN

## 2022-07-12 MED ORDER — APIXABAN 5 MG PO TABS
5.0000 mg | ORAL_TABLET | Freq: Two times a day (BID) | ORAL | Status: DC
  Administered 2022-07-12: 5 mg via ORAL
  Filled 2022-07-12: qty 1

## 2022-07-12 MED ORDER — DILTIAZEM LOAD VIA INFUSION
10.0000 mg | Freq: Once | INTRAVENOUS | Status: AC
  Administered 2022-07-12: 10 mg via INTRAVENOUS
  Filled 2022-07-12: qty 10

## 2022-07-12 MED ORDER — DILTIAZEM HCL-DEXTROSE 125-5 MG/125ML-% IV SOLN (PREMIX)
5.0000 mg/h | INTRAVENOUS | Status: DC
  Administered 2022-07-12: 5 mg/h via INTRAVENOUS
  Filled 2022-07-12: qty 125

## 2022-07-12 MED ORDER — DILTIAZEM HCL ER COATED BEADS 180 MG PO CP24
180.0000 mg | ORAL_CAPSULE | Freq: Every day | ORAL | Status: DC
  Administered 2022-07-12: 180 mg via ORAL
  Filled 2022-07-12 (×2): qty 1

## 2022-07-12 MED ORDER — MELATONIN 5 MG PO TABS: 5.0000 mg | ORAL_TABLET | Freq: Every day | ORAL | Status: DC

## 2022-07-12 MED ORDER — LOSARTAN POTASSIUM 50 MG PO TABS
50.0000 mg | ORAL_TABLET | Freq: Every day | ORAL | Status: DC
  Administered 2022-07-12: 50 mg via ORAL
  Filled 2022-07-12: qty 1

## 2022-07-12 MED ORDER — ACETAMINOPHEN 325 MG PO TABS
650.0000 mg | ORAL_TABLET | ORAL | Status: DC | PRN
  Administered 2022-07-12: 650 mg via ORAL
  Filled 2022-07-12: qty 2

## 2022-07-12 MED ORDER — ONDANSETRON HCL 4 MG/2ML IJ SOLN: 4.0000 mg | Freq: Four times a day (QID) | INTRAMUSCULAR | Status: DC | PRN

## 2022-07-12 MED ORDER — ALPRAZOLAM 0.5 MG PO TABS: 0.2500 mg | ORAL_TABLET | Freq: Two times a day (BID) | ORAL | Status: DC | PRN

## 2022-07-12 MED ORDER — APIXABAN 5 MG PO TABS: 5.0000 mg | ORAL_TABLET | Freq: Two times a day (BID) | ORAL | Status: DC

## 2022-07-12 NOTE — ED Notes (Signed)
Pt in bed, pt appears to be in a sinus rhythm with a rate of 68, md notified of rhythm and bp.  Pt awake and answering questions appropriately.

## 2022-07-12 NOTE — ED Notes (Signed)
Pt in bed, pt states that she needs to void, pt to and from bathroom with assistance,

## 2022-07-12 NOTE — TOC Initial Note (Signed)
Transition of Care [facility]) - Initial/Assessment Note    Patient Details  Name: Meagan Wilcox MRN: [id number] Date of Birth: [birth year]  Transition of Care [facility]) CM/SW Contact:    Allayne Butcher, RN Phone Number: 07/12/2022, 1:50 PM  Clinical Narrative:                 Patient admitted for Afib now resolved.  Patient changed to observation.  RNCM reviewed MOON with patient at the bedside.  Patient lives alone and is independent.  Her niece will pick her up today.    Expected Discharge Plan: Home/Self Care Barriers to Discharge: Barriers Resolved   Patient Goals and CMS Choice Patient states their goals for this hospitalization and ongoing recovery are:: glad to be going home      Expected Discharge Plan and Services Expected Discharge Plan: Home/Self Care   Discharge Planning Services: CM Consult   Living arrangements for the past 2 months: Single Family Home Expected Discharge Date: 07/12/22               DME Arranged: N/A DME Agency: NA       HH Arranged: NA          Prior Living Arrangements/Services Living arrangements for the past 2 months: Single Family Home Lives with:: Self Patient language and need for interpreter reviewed:: Yes Do you feel safe going back to the place where you live?: Yes      Need for Family Participation in Patient Care: Yes (Comment) Care giver support system in place?: Yes (comment)   Criminal Activity/Legal Involvement Pertinent to Current Situation/Hospitalization: No - Comment as needed  Activities of Daily Living      Permission Sought/Granted                  Emotional Assessment Appearance:: Appears stated age Attitude/Demeanor/Rapport: Engaged Affect (typically observed): Accepting Orientation: : Oriented to Self, Oriented to Place, Oriented to  Time, Oriented to Situation Alcohol / Substance Use: Not Applicable Psych Involvement: No (comment)  Admission diagnosis:  Rapid atrial fibrillation [facility])  [I48.91] Patient Active Problem List   Diagnosis Date Noted   Rapid atrial fibrillation (HCC) 06/27/2022   Cardiac pacemaker 01/26/22 05/25/2022   Paroxysmal atrial flutter (HCC) 12/29/2021   PAF (paroxysmal atrial fibrillation) (HCC) 12/28/2021   Hypothyroidism    Atrial fibrillation with rapid ventricular response (HCC) 08/23/2021   PVC's (premature ventricular contractions) 03/04/2021   Bradycardia 03/04/2021   Persistent atrial fibrillation (HCC) 12/31/2020   Chronic heart failure with preserved ejection fraction (HFpEF) (HCC) 12/31/2020   Hyperlipidemia 12/31/2020   Long term current use of amiodarone 07/02/2020   Annual physical exam 06/25/2020   Anxiety 03/24/2020   Fatigue 01/30/2020   Nausea 01/30/2020   Dyspnea on exertion 01/30/2020   Varicose veins of leg with edema, bilateral 12/23/2019   Atrial flutter (HCC) 05/07/2019   Lumbar radiculopathy 08/01/2017   Neuroforaminal stenosis of lumbar spine 08/01/2017   Lumbar degenerative disc disease 08/01/2017   Atypical ductal hyperplasia of left breast 08/31/2016   Essential hypertension    PCP:  Lauro Regulus, MD Pharmacy:   [facility] [address] - [place], [place] - [address] [place] [address] Phone: [phone] Fax: [phone]  [id number] [place], [place] - [address] AT [facility] OF [address] [place] [address] [place] [address] Phone: [phone] Fax: [phone]     Social Determinants of  Health (SDOH) Interventions    Readmission Risk Interventions     No data to display

## 2022-07-12 NOTE — Consult Note (Signed)
Cardiology Consultation:   Patient ID: Meagan Wilcox; [id number]; [birth year]   Admit date: 07/12/2022 Date of Consult: 07/12/2022  Primary Care Provider: Lauro RegulusAnderson, Marshall W, MD Primary Cardiologist: End Primary Electrophysiologist:  Graciela HusbandsKlein   Patient Profile:   Meagan Wilcox is a 88 y.o. female with a hx of persistent A-fib/flutter complicated by symptomatic bradycardia status post dual-chamber PPM in 01/2022, HFpEF, HTN, HLD, hypothyroidism, arthritis, and GERD who is being seen today for the evaluation of persistent A-fib with RVR at the request of Dr. Para Marchuncan.  History of Present Illness:   Meagan Wilcox was diagnosed with A-fib/atypical atrial flutter in 04/2019 treated with TEE guided DCCV.  2D echo at that time demonstrated an EF of 55 to 60%, mild LVH, normal RV systolic function and ventricular cavity size, mildly dilated left atrium, mild to moderate mitral regurgitation, mild tricuspid regurgitation, and mildly to moderately elevated PASP.  She had recurrent tachypalpitations in 05/2019 and was found to be in atrial flutter with 2:1 conduction.  She was placed on amiodarone and underwent repeat DCCV in 05/2021.  However, in the setting of nausea and fatigue she did not tolerate amiodarone despite reduced dosing.  Outpatient cardiac monitoring in 12/2020 showed a rhythm predominantly sinus with intermittent junctional rhythm as well as a 5% PVC burden.  Echo in this setting showed an EF of 55 to 60%, no regional wall motion abnormalities, mild LVH, grade 2 diastolic dysfunction, normal RV systolic function with mildly enlarged ventricular cavity size, moderately elevated PASP, mildly dilated right atrium, mild mitral regurgitation, mild to moderate tricuspid regurgitation, and an estimated right atrial pressure of 3 mmHg.  She was admitted in 08/2021 with fatigue and palpitations with recurrent atrial flutter with RVR.  She was seen by EP and placed on flecainide as well as nadolol.  She  underwent TEE guided DCCV and was noted to be bradycardic post cardioversion.  Nadolol was discontinued in favor of pindolol.  In follow-up in 09/2021 she remained bradycardic leading flecainide and beta-blocker to be discontinued.  Repeat outpatient cardiac monitoring showed no A-fib or flutter.  She had 44 brief runs of atrial tachycardia.  She was noted to be back in atrial flutter with RVR in the office in 12/2021 and transferred to the ED.  She underwent repeat DCCV.  She subsequently followed up with EP and underwent PPM implantation due to symptomatic bradycardia.  Diltiazem has also previously been discontinued with the thought that this was contributing to her fatigue, though she noted no significant change in symptoms.  She was most recently admitted to the hospital earlier this month with A-fib with RVR and spontaneously converted on a Cardizem drip.  She returned to the hospital on 07/12/2022 with recurrent tachypalpitations.  She reported her sister had passed away recently.  She was in her usual state of health on the evening of 11/27 until around 7:30 PM when she developed sudden onset of tachypalpitations without chest pain, dyspnea, diaphoresis, nausea, or vomiting.  She reported her heart rate at home by her home BP cuff was in the 190s bpm.  Due to persistent symptoms she presented to the ED where she was found to be in A-fib with RVR with ventricular rates in the 140s bpm.  She reported adherence to all medications, including anticoagulation without missing any doses.  Chest x-ray showed chronic appearing increased lung markings with mild bibasilar atelectasis.  High-sensitivity troponin negative x 2.  TSH normal.  BNP 99.  CBC unremarkable.  Potassium 3.8,  BUN 19 creatinine 0.8.  Magnesium 2.1.  She was started on diltiazem drip with improvement in ventricular rates to the 120s at time, though returned to the 140s with minimal movement in the hospital bed.  Initially, plans were for the patient  to undergo DCCV today as well as be evaluated by EP.  EP did evaluate the patient who recommended DCCV and follow-up with them in our office on 11/30 for discussion of ablation.  However, patient spontaneously converted on diltiazem drip prior to DCCV leading to its discontinuation.  She was transitioned back to oral diltiazem at the recommendation of EP.  She has maintained sinus rhythm since and is hemodynamically stable.    Past Medical History:  Diagnosis Date   (HFpEF) heart failure with preserved ejection fraction (HCC)    a. 01/2021 Echo: EF 55-60%, no rwma, GrII DD, nl RV fxn, mod elev PASP. Mild MR, mild-mod TR.   Abnormal mammogram, unspecified 2011   Arthritis 2002   Chronic pain syndrome 08/01/2017   GERD (gastroesophageal reflux disease)    Heart murmur 1985   Hyperlipidemia    Hypertension 2005   Hypothyroidism    Persistent atrial fibrillation/Atypical atrial flutter (HCC)    a. 04/2019 TEE/DCCV; b. 05/2019 Recurrent Aflutter-->Amio-->DCCV; c. CHA2DS2VASc = 4-->Eliquis; d. 08/2021 recurrent Afib req DCCV.  Flecainide started; e. 09/2021 Flecainide d/c'd 2/2 brady - couldn't tol bb.   PSVT (paroxysmal supraventricular tachycardia)    a. 09/2021 Zio: Predominantly sinus rhythm (67; range 49-102).  Rare PACs with occasional PVCs (PVC burden 3.6%).  44 SVT runs-fastest 190, longest 12.9 seconds.  No sustained arrhythmias/prolonged pauses.  No significant arrhythmia identified corresponding to patient's symptoms.    Past Surgical History:  Procedure Laterality Date   APPENDECTOMY  1939   BREAST BIOPSY Left 02-24-14   benign   BREAST BIOPSY Left 02/11/2016   neg   BREAST BIOPSY Left 2013   neg   BREAST EXCISIONAL BIOPSY Left 2014   neg   BREAST SURGERY Left 2013   stereotactic biopsy    BREAST SURGERY Left May 2010   1 mm foci of atypical ductal hyperplasia   CARDIOVERSION N/A 05/08/2019   Procedure: CARDIOVERSION;  Surgeon: Debbe Odea, MD;  Location: [facility];   Service: Cardiovascular;  Laterality: N/A;   CARDIOVERSION N/A 05/31/2019   Procedure: CARDIOVERSION;  Surgeon: Antonieta Iba, MD;  Location: [facility];  Service: Cardiovascular;  Laterality: N/A;   CARDIOVERSION N/A 08/25/2021   Procedure: CARDIOVERSION;  Surgeon: Debbe Odea, MD;  Location: [facility];  Service: Cardiovascular;  Laterality: N/A;   CARDIOVERSION N/A 12/29/2021   Procedure: CARDIOVERSION;  Surgeon: Debbe Odea, MD;  Location: [facility];  Service: Cardiovascular;  Laterality: N/A;   cataract surgery  Bilateral 2009   COLONOSCOPY  2010   Dr. Mechele Collin   left breast biopsy   2009   PACEMAKER IMPLANT N/A 01/26/2022   Procedure: PACEMAKER IMPLANT;  Surgeon: Duke Salvia, MD;  Location: [facility] INVASIVE CV LAB;  Service: Cardiovascular;  Laterality: N/A;   TEE WITHOUT CARDIOVERSION N/A 05/08/2019   Procedure: TRANSESOPHAGEAL ECHOCARDIOGRAM (TEE);  Surgeon: Debbe Odea, MD;  Location: [facility] ORS;  Service: Cardiovascular;  Laterality: N/A;     Home Meds: Prior to Admission medications   Medication Sig Start Date End Date Taking? Authorizing Provider  acetaminophen (TYLENOL) 325 MG tablet Take 325 mg by mouth every 6 (six) hours as needed for mild pain, fever, headache or moderate pain.   Yes [provider]  amitriptyline (ELAVIL)  25 MG tablet TAKE 1 TABLET BY MOUTH DAILY 01/24/22  Yes Masoud, Renda Rolls, MD  apixaban (ELIQUIS) 5 MG TABS tablet TAKE 1 TABLET(5 MG) BY MOUTH TWICE DAILY Patient taking differently: Take 5 mg by mouth 2 (two) times daily. 06/23/22  Yes End, Cristal Deer, MD  BIOTIN PO Take 1 tablet by mouth daily.   Yes [provider]  Camphor-Menthol-Methyl Sal (SALONPAS) 3.08-20-08 % PTCH Place 1 patch onto the skin daily as needed (back pain).   Yes [provider]  diltiazem (CARDIZEM CD) 180 MG 24 hr capsule Take 1 capsule (180 mg total) by mouth daily. 06/28/22 09/26/22 Yes Darlin Priestly, MD  furosemide (LASIX) 20 MG tablet TAKE 1  TABLET(20 MG) BY MOUTH DAILY Patient taking differently: Take 20 mg by mouth daily. 06/23/22  Yes End, Cristal Deer, MD  levothyroxine (SYNTHROID) 125 MCG tablet TAKE 1 TABLET(125 MCG) BY MOUTH DAILY Patient taking differently: Take 125 mcg by mouth daily before breakfast. 06/27/21  Yes Masoud, Renda Rolls, MD  LORazepam (ATIVAN) 0.5 MG tablet Take 1 tablet by mouth every 12 (twelve) hours as needed for anxiety. 09/22/21  Yes [provider]  losartan (COZAAR) 50 MG tablet Take 1 tablet (50 mg total) by mouth daily. 05/25/22 05/20/23 Yes End, Cristal Deer, MD  Multiple Vitamin (MULTIVITAMIN) capsule Take 1 capsule by mouth daily.   Yes [provider]  omeprazole (PRILOSEC) 20 MG capsule TAKE 1 CAPSULE BY MOUTH DAILY 03/17/22  Yes Masoud, Renda Rolls, MD  pentosan polysulfate (ELMIRON) 100 MG capsule Take 100 mg by mouth 2 (two) times daily.   Yes [provider]  psyllium (METAMUCIL SMOOTH TEXTURE) 28 % packet Take 1 packet by mouth daily at 8 pm.   Yes [provider]  rosuvastatin (CRESTOR) 5 MG tablet Take 1 tablet (5 mg total) by mouth daily. 07/06/22 10/04/22 Yes Hammock, Lavonna Rua, NP  verapamil (CALAN) 40 MG tablet Take 1 tablet (40 mg) by mouth every 4 hours up to 3 doses as needed for recurrent tachycardia 12/02/21  Yes Duke Salvia, MD  melatonin 5 MG TABS Take 1 tablet (5 mg total) by mouth at bedtime as needed. Home med. 07/12/22   Darlin Priestly, MD    Inpatient Medications: Scheduled Meds:  apixaban  5 mg Oral BID   diltiazem  180 mg Oral Daily   levothyroxine  125 mcg Oral Q0600   losartan  50 mg Oral Daily   melatonin  5 mg Oral QHS   rosuvastatin  5 mg Oral Daily   Continuous Infusions:  PRN Meds: acetaminophen, ALPRAZolam, ondansetron (ZOFRAN) IV  Allergies:   Allergies  Allergen Reactions   Erythromycin Other (See Comments)   Other Other (See Comments)   Amoxicillin Diarrhea   Penicillins Rash    Did it involve swelling of the face/tongue/throat, SOB,  or low BP? No Did it involve sudden or severe rash/hives, skin peeling, or any reaction on the inside of your mouth or nose? No Did you need to seek medical attention at a hospital or doctor's office? Yes When did it last happen?      65 years ago If all above answers are "NO", may proceed with cephalosporin use.    Social History:   Social History   Socioeconomic History   Marital status: Single    Spouse name: Not on file   Number of children: Not on file   Years of education: Not on file   Highest education level: Not on file  Occupational History   Not on  file  Tobacco Use   Smoking status: Never   Smokeless tobacco: Never  Vaping Use   Vaping Use: Never used  Substance and Sexual Activity   Alcohol use: No    Alcohol/week: 0.0 standard drinks of alcohol   Drug use: No   Sexual activity: Not on file  Other Topics Concern   Not on file  Social History Narrative   Not on file   Social Determinants of Health   Financial Resource Strain: Not on file  Food Insecurity: No Food Insecurity (06/27/2022)   Hunger Vital Sign    Worried About Running Out of Food in the Last Year: Never true    Ran Out of Food in the Last Year: Never true  Transportation Needs: No Transportation Needs (06/27/2022)   PRAPARE - Administrator, Civil Service (Medical): No    Lack of Transportation (Non-Medical): No  Physical Activity: Not on file  Stress: Not on file  Social Connections: Not on file  Intimate Partner Violence: Not At Risk (06/27/2022)   Humiliation, Afraid, Rape, and Kick questionnaire    Fear of Current or Ex-Partner: No    Emotionally Abused: No    Physically Abused: No    Sexually Abused: No     Family History:   Family History  Problem Relation Age of Onset   Heart disease Mother    Heart disease Father    Diabetes Father    Stroke Father    Arthritis Father     ROS:  Review of Systems  Constitutional:  Positive for malaise/fatigue. Negative for  chills, diaphoresis, fever and weight loss.  HENT:  Negative for congestion.   Eyes:  Negative for discharge and redness.  Respiratory:  Negative for cough, sputum production, shortness of breath and wheezing.   Cardiovascular:  Positive for palpitations. Negative for chest pain, orthopnea, claudication, leg swelling and PND.  Gastrointestinal:  Negative for abdominal pain, blood in stool, heartburn, melena, nausea and vomiting.  Musculoskeletal:  Negative for falls and myalgias.  Skin:  Negative for rash.  Neurological:  Negative for dizziness, tingling, tremors, sensory change, speech change, focal weakness, loss of consciousness and weakness.  Endo/Heme/Allergies:  Does not bruise/bleed easily.  Psychiatric/Behavioral:  Negative for substance abuse. The patient is not nervous/anxious.   All other systems reviewed and are negative.     Physical Exam/Data:   Vitals:   07/12/22 1030 07/12/22 1100 07/12/22 1130 07/12/22 1200  BP: 117/66 (!) 119/54 (!) 141/98 (!) 128/53  Pulse: 68 60 68 68  Resp: (!) 22 19 18 15   Temp:      TempSrc:      SpO2: 99% 96% 97% 95%  Weight:      Height:        Intake/Output Summary (Last 24 hours) at 07/12/2022 1236 Last data filed at 07/12/2022 0704 Gross per 24 hour  Intake 47.6 ml  Output --  Net 47.6 ml   Filed Weights   07/12/22 0157  Weight: 68.9 kg   Body mass index is 26.91 kg/m.   Physical Exam: General: Well developed, well nourished, in no acute distress. Head: Normocephalic, atraumatic, sclera non-icteric, no xanthomas, nares without discharge.  Neck: Negative for carotid bruits. JVD not elevated. Lungs: Clear bilaterally to auscultation without wheezes, rales, or rhonchi. Breathing is unlabored. Heart: Tachycardic, irregularly irregular with S1 S2. No murmurs, rubs, or gallops appreciated. Abdomen: Soft, non-tender, non-distended with normoactive bowel sounds. No hepatomegaly. No rebound/guarding. No obvious abdominal  masses. Msk:  Strength and tone appear normal for age. Extremities: No clubbing or cyanosis. No edema. Distal pedal pulses are 2+ and equal bilaterally. Neuro: Alert and oriented X 3. No facial asymmetry. No focal deficit. Moves all extremities spontaneously. Psych:  Responds to questions appropriately with a normal affect.   EKG:  The EKG was personally reviewed and demonstrates: A-fib with RVR, 146 bpm, likely rate related nonspecific ST-T changes.  Repeat EKG shows NSR, 67 bpm, and nonspecific ST-T changes Telemetry:  Telemetry was personally reviewed and demonstrates: A-fib with RVR with ventricular rates from the 120s to 140s bpm  Weights: Filed Weights   07/12/22 0157  Weight: 68.9 kg    Relevant CV Studies: As above  Laboratory Data:  Chemistry Recent Labs  Lab 07/12/22 0211  NA 139  K 3.8  CL 106  CO2 25  GLUCOSE 128*  BUN 19  CREATININE 0.80  CALCIUM 9.7  GFRNONAA >60  ANIONGAP 8    No results for input(s): "PROT", "ALBUMIN", "AST", "ALT", "ALKPHOS", "BILITOT" in the last 168 hours. Hematology Recent Labs  Lab 07/12/22 0211  WBC 8.2  RBC 4.13  HGB 12.5  HCT 37.5  MCV 90.8  MCH 30.3  MCHC 33.3  RDW 13.4  PLT 222   Cardiac EnzymesNo results for input(s): "TROPONINI" in the last 168 hours. No results for input(s): "TROPIPOC" in the last 168 hours.  BNP Recent Labs  Lab 07/12/22 0210  BNP 99.4    DDimer No results for input(s): "DDIMER" in the last 168 hours.  Radiology/Studies:  [facility] Chest Port 1 View  Result Date: 07/12/2022 IMPRESSION: Chronic appearing increased lung markings with mild bibasilar atelectasis. Electronically Signed   By: Aram Candela M.D.   On: 07/12/2022 02:28    Assessment and Plan:   Persistent Afib with RVR with history of atrial flutter: Presented to the ED with recurrent symptomatic A-fib with RVR.  Evaluated by EP with recommendation to proceed with DCCV and follow-up with them in the office on 07/14/2022 for  discussion of ablation.  Patient spontaneously converted to sinus rhythm on diltiazem drip which was subsequently discontinued and the patient was placed on oral diltiazem 180 mg daily.  Continue apixaban 5 mg twice daily (does not meet reduced dosing criteria).  No symptoms concerning for bleeding.  Follow-up with EP in the office.  HFpEF: She appears euvolemic and well compensated with symptoms consistent with NYHA class III.  However, deconditioning may be contributing as well.  Symptomatic bradycardia: Status post PPM.  Follow-up with EP as directed.  HTN: Blood pressure stable.  HLD: PTA Crestor.      For questions or updates, please contact [facility] Please consult [email or web address] for contact info under Cardiology/STEMI.   Signed, Eula Listen, PA-C [facility] Pager: [phone] 07/12/2022, 12:36 PM

## 2022-07-12 NOTE — Care Management Obs Status (Signed)
MEDICARE OBSERVATION STATUS NOTIFICATION   Patient Details  Name: Meagan Wilcox MRN: [id number] Date of Birth: 02-21-1931   Medicare Observation Status Notification Given:  Yes    Allayne Butcher, RN 07/12/2022, 1:19 PM

## 2022-07-12 NOTE — Anesthesia Preprocedure Evaluation (Deleted)
Anesthesia Evaluation  Patient identified by MRN, date of birth, ID band Patient awake    Reviewed: Allergy & Precautions, NPO status , Patient's Chart, lab work & pertinent test results  History of Anesthesia Complications Negative for: history of anesthetic complications  Airway Mallampati: III  TM Distance: >3 FB Neck ROM: Full    Dental  (+) Implants   Pulmonary neg sleep apnea, neg COPD, Not current smoker   Pulmonary exam normal breath sounds clear to auscultation       Cardiovascular Exercise Tolerance: Poor hypertension, Pt. on medications +CHF (heart failure with preserved ejection fraction), + PND and + DOE  (-) Past MI + dysrhythmias (s/p cardioversion) Atrial Fibrillation + pacemaker (01/2022 dual-chamber pacemaker) + Valvular Problems/Murmurs MVP  Rhythm:Irregular Rate:Tachycardia  01/2021 Echo: EF 55-60%, no rwma, GrII DD, nl RV fxn, mod elev PASP. Mild MR, mild-mod TR.   Neuro/Psych neg Seizures PSYCHIATRIC DISORDERS Anxiety        GI/Hepatic Neg liver ROS,GERD  Medicated and Controlled,,  Endo/Other  neg diabetesHypothyroidism    Renal/GU negative Renal ROS     Musculoskeletal  (+) Arthritis ,  Neuroforaminal stenosis of lumbar spine   Abdominal   Peds  Hematology   Anesthesia Other Findings Recently hospitalized from 11/13 to 11/14 with rapid A-fib that converted on diltiazem infusion, discharged with an increased home dose of diltiazem 180 as well as verapamil 40 mg as needed, who presents to the ED with palpitations that started suddenly while at rest at 7:30 PM on 11/2   Past Medical History: No date: (HFpEF) heart failure with preserved ejection fraction (HCC)     Comment:  a. 01/2021 Echo: EF 55-60%, no rwma, GrII DD, nl RV fxn,               mod elev PASP. Mild MR, mild-mod TR. 2011: Abnormal mammogram, unspecified 2002: Arthritis 08/01/2017: Chronic pain syndrome No date: GERD  (gastroesophageal reflux disease) 1985: Heart murmur No date: Hyperlipidemia 2005: Hypertension No date: Hypothyroidism No date: Persistent atrial fibrillation/Atypical atrial flutter (HCC)     Comment:  a. 04/2019 TEE/DCCV; b. 05/2019 Recurrent               Aflutter-->Amio-->DCCV; c. CHA2DS2VASc = 4-->Eliquis; d.               08/2021 recurrent Afib req DCCV.  Flecainide started; e.               09/2021 Flecainide d/c'd 2/2 brady - couldn't tol bb. No date: PSVT (paroxysmal supraventricular tachycardia) (HCC)     Comment:  a. 09/2021 Zio: Predominantly sinus rhythm (67; range               49-102).  Rare PACs with occasional PVCs (PVC burden               3.6%).  44 SVT runs-fastest 190, longest 12.9 seconds.                No sustained arrhythmias/prolonged pauses.  No               significant arrhythmia identified corresponding to               patient's symptoms.   Reproductive/Obstetrics                             Anesthesia Physical Anesthesia Plan  ASA: 3  Anesthesia Plan: General   Post-op Pain Management:  Minimal or no pain anticipated   Induction: Intravenous  PONV Risk Score and Plan: 3 and Treatment may vary due to age or medical condition and TIVA  Airway Management Planned: Nasal Cannula and Natural Airway  Additional Equipment: None  Intra-op Plan:   Post-operative Plan:   Informed Consent:      Dental advisory given  Plan Discussed with: CRNA and Anesthesiologist  Anesthesia Plan Comments: (Discussed risks of anesthesia with patient, including possibility of difficulty with spontaneous ventilation under anesthesia necessitating airway intervention, PONV, and rare risks such as cardiac or respiratory or neurological events, and allergic reactions. Discussed the role of CRNA in patient's perioperative care. Patient understands.)        Anesthesia Quick Evaluation

## 2022-07-12 NOTE — Discharge Summary (Signed)
Physician Discharge Summary   Meagan Wilcox  female DOB: August 07, 1931  QIO:[id number]  PCP: Lauro Regulus, MD  Admit date: 07/12/2022 Discharge date: 07/12/2022  Admitted From: home Disposition:  home CODE STATUS: Full code  Discharge Instructions     Amb referral to [facility]   Complete by: As directed       Hospital Course:  For full details, please see H&P, progress notes, consult notes and ancillary notes.  Briefly,  Meagan Wilcox is a 88 y.o. female with medical history significant for paroxysmal atrial fibrillation/flutter complicated by bradycardia requiring pacemaker (01/2022), and history of prior cardioversions, chronic HFpEF, hypertension, hypothyroidism, recently hospitalized from 11/13 to 11/14 with rapid A-fib that converted on diltiazem infusion, discharged with an increased home dose of diltiazem 180 as well as verapamil 40 mg as needed, who presented to the ED with palpitations that started suddenly while at rest at 7:30 PM on 11/27.   * Rapid atrial fibrillation (HCC) --pt received dilt gtt and quickly converted back NSR.  Home cardizem 180 mg daily resumed.  Cardiology consulted, and did not rec any change in medication, and scheduled pt to see Dr. Graciela Husbands as outpatient in 2 days for further management.   --cont home Eliquis   Cardiac pacemaker 01/26/22 Indicated for symptomatic bradycardia Patient is followed by Dr. Graciela Husbands   Hypothyroidism Continue levothyroxine   Chronic heart failure with preserved ejection fraction (HFpEF) (HCC) Clinically euvolemic Continue losartan and furosemide  Last EF 55 to 60% June 2022   Essential hypertension Controlled Continue losartan and furosemide    Unless noted above, medications under "STOP" list are ones pt was not taking PTA.  Discharge Diagnoses:  Principal Problem:   Rapid atrial fibrillation [facility]) Active Problems:   Essential hypertension   Chronic heart failure with preserved ejection fraction  (HFpEF) (HCC)   Cardiac pacemaker 01/26/22   30 Day Unplanned Readmission Risk Score    Flowsheet Row ED from 07/12/2022 in [facility]  30 Day Unplanned Readmission Risk Score (%) 10.71 Filed at 07/12/2022 0801       This score is the patient's risk of an unplanned readmission within 30 days of being discharged (0 -100%). The score is based on dignosis, age, lab data, medications, orders, and past utilization.   Low:  0-14.9   Medium: 15-21.9   High: 22-29.9   Extreme: 30 and above         Discharge Instructions:  Allergies as of 07/12/2022       Reactions   Erythromycin Other (See Comments)   Other Other (See Comments)   Amoxicillin Diarrhea   Penicillins Rash   Did it involve swelling of the face/tongue/throat, SOB, or low BP? No Did it involve sudden or severe rash/hives, skin peeling, or any reaction on the inside of your mouth or nose? No Did you need to seek medical attention at a hospital or doctor's office? Yes When did it last happen?      65 years ago If all above answers are "NO", may proceed with cephalosporin use.        Medication List     STOP taking these medications    simvastatin 20 MG tablet Commonly known as: ZOCOR       TAKE these medications    acetaminophen 325 MG tablet Commonly known as: TYLENOL Take 325 mg by mouth every 6 (six) hours as needed for mild pain, fever, headache or moderate pain.   amitriptyline  25 MG tablet Commonly known as: ELAVIL TAKE 1 TABLET BY MOUTH DAILY   BIOTIN PO Take 1 tablet by mouth daily.   diltiazem 180 MG 24 hr capsule Commonly known as: CARDIZEM CD Take 1 capsule (180 mg total) by mouth daily.   Eliquis 5 MG Tabs tablet Generic drug: apixaban TAKE 1 TABLET(5 MG) BY MOUTH TWICE DAILY What changed: See the new instructions.   furosemide 20 MG tablet Commonly known as: LASIX TAKE 1 TABLET(20 MG) BY MOUTH DAILY What changed: See the new  instructions.   levothyroxine 125 MCG tablet Commonly known as: SYNTHROID TAKE 1 TABLET(125 MCG) BY MOUTH DAILY What changed: See the new instructions.   LORazepam 0.5 MG tablet Commonly known as: ATIVAN Take 1 tablet by mouth every 12 (twelve) hours as needed for anxiety.   losartan 50 MG tablet Commonly known as: COZAAR Take 1 tablet (50 mg total) by mouth daily.   melatonin 5 MG Tabs Take 1 tablet (5 mg total) by mouth at bedtime as needed. Home med. What changed:  when to take this reasons to take this additional instructions   multivitamin capsule Take 1 capsule by mouth daily.   omeprazole 20 MG capsule Commonly known as: PRILOSEC TAKE 1 CAPSULE BY MOUTH DAILY   pentosan polysulfate 100 MG capsule Commonly known as: ELMIRON Take 100 mg by mouth 2 (two) times daily.   psyllium 28 % packet Commonly known as: METAMUCIL SMOOTH TEXTURE Take 1 packet by mouth daily at 8 pm.   rosuvastatin 5 MG tablet Commonly known as: CRESTOR Take 1 tablet (5 mg total) by mouth daily.   Salonpas 3.08-20-08 % Ptch Generic drug: Camphor-Menthol-Methyl Sal Place 1 patch onto the skin daily as needed (back pain).   verapamil 40 MG tablet Commonly known as: CALAN Take 1 tablet (40 mg) by mouth every 4 hours up to 3 doses as needed for recurrent tachycardia         Follow-up Information     Duke Salvia, MD Follow up on 07/14/2022.   Specialty: Cardiology Why: 8:40 Contact information: [address] [place] [address] [phone]                 Allergies  Allergen Reactions   Erythromycin Other (See Comments)   Other Other (See Comments)   Amoxicillin Diarrhea   Penicillins Rash    Did it involve swelling of the face/tongue/throat, SOB, or low BP? No Did it involve sudden or severe rash/hives, skin peeling, or any reaction on the inside of your mouth or nose? No Did you need to seek medical attention at a hospital or doctor's  office? Yes When did it last happen?      65 years ago If all above answers are "NO", may proceed with cephalosporin use.     The results of significant diagnostics from this hospitalization (including imaging, microbiology, ancillary and laboratory) are listed below for reference.   Consultations:   Procedures/Studies: DG Chest Port 1 View  Result Date: 07/12/2022 CLINICAL DATA:  Palpitations. EXAM: PORTABLE CHEST 1 VIEW COMPARISON:  June 27, 2022 FINDINGS: A dual lead AICD is noted with stable lead wire positioning. The heart size and mediastinal contours are within normal limits. A mild, chronic appearing increased lung markings are present. Mild atelectasis is seen within the bilateral lung bases. There is no evidence of a pleural effusion or pneumothorax. The visualized skeletal structures are unremarkable. IMPRESSION: Chronic appearing increased lung markings with mild bibasilar atelectasis.  Electronically Signed   By: Aram Candela M.D.   On: 07/12/2022 02:28   DG Chest Port 1 View  Result Date: 06/27/2022 CLINICAL DATA:  Chest pain EXAM: PORTABLE CHEST 1 VIEW COMPARISON:  January 26, 2022 FINDINGS: There is a dual lead AICD with stable lead wire positioning. The heart size and mediastinal contours are within normal limits. Mild, chronic appearing increased lung markings are seen. There is no evidence of an acute infiltrate, pleural effusion or pneumothorax. The visualized skeletal structures are unremarkable. IMPRESSION: Chronic appearing increased lung markings without evidence of acute or active cardiopulmonary disease. Electronically Signed   By: Aram Candela M.D.   On: 06/27/2022 01:14      Labs: BNP (last 3 results) Recent Labs    08/23/21 1149 12/28/21 1602 07/12/22 0210  BNP 292.7* 111.7* 99.4   Basic Metabolic Panel: Recent Labs  Lab 07/12/22 0210 07/12/22 0211  NA  --  139  K  --  3.8  CL  --  106  CO2  --  25  GLUCOSE  --  128*  BUN  --  19   CREATININE  --  0.80  CALCIUM  --  9.7  MG 2.1  --    Liver Function Tests: No results for input(s): "AST", "ALT", "ALKPHOS", "BILITOT", "PROT", "ALBUMIN" in the last 168 hours. No results for input(s): "LIPASE", "AMYLASE" in the last 168 hours. No results for input(s): "AMMONIA" in the last 168 hours. CBC: Recent Labs  Lab 07/12/22 0211  WBC 8.2  HGB 12.5  HCT 37.5  MCV 90.8  PLT 222   Cardiac Enzymes: No results for input(s): "CKTOTAL", "CKMB", "CKMBINDEX", "TROPONINI" in the last 168 hours. BNP: Invalid input(s): "POCBNP" CBG: No results for input(s): "GLUCAP" in the last 168 hours. D-Dimer No results for input(s): "DDIMER" in the last 72 hours. Hgb A1c No results for input(s): "HGBA1C" in the last 72 hours. Lipid Profile No results for input(s): "CHOL", "HDL", "LDLCALC", "TRIG", "CHOLHDL", "LDLDIRECT" in the last 72 hours. Thyroid function studies Recent Labs    07/12/22 0210  TSH 0.645   Anemia work up No results for input(s): "VITAMINB12", "FOLATE", "FERRITIN", "TIBC", "IRON", "RETICCTPCT" in the last 72 hours. Urinalysis    Component Value Date/Time   COLORURINE STRAW (A) 12/29/2021 0017   APPEARANCEUR CLEAR (A) 12/29/2021 0017   LABSPEC 1.006 12/29/2021 0017   PHURINE 5.0 12/29/2021 0017   GLUCOSEU NEGATIVE 12/29/2021 0017   HGBUR MODERATE (A) 12/29/2021 0017   BILIRUBINUR NEGATIVE 12/29/2021 0017   KETONESUR NEGATIVE 12/29/2021 0017   PROTEINUR NEGATIVE 12/29/2021 0017   NITRITE NEGATIVE 12/29/2021 0017   LEUKOCYTESUR NEGATIVE 12/29/2021 0017   Sepsis Labs Recent Labs  Lab 07/12/22 0211  WBC 8.2   Microbiology No results found for this or any previous visit (from the past 240 hour(s)).   Total time spend on discharging this patient, including the last patient exam, discussing the hospital stay, instructions for ongoing care as it relates to all pertinent caregivers, as well as preparing the medical discharge records, prescriptions, and/or  referrals as applicable, is 60 minutes.    Darlin Priestly, MD  [facility] 07/12/2022, 11:57 AM

## 2022-07-12 NOTE — Care Management CC44 (Signed)
Condition Code 44 Documentation Completed  Patient Details  Name: Meagan Wilcox MRN: [id number] Date of Birth: Oct 14, 1930   Condition Code 44 given:  Yes Patient signature on Condition Code 44 notice:  Yes Documentation of 2 MD's agreement:  Yes Code 44 added to claim:  Yes    Allayne Butcher, RN 07/12/2022, 1:19 PM

## 2022-07-12 NOTE — ED Triage Notes (Addendum)
Pt arrived via [facility] from home with c/o continued palpitations since 2000 last night and after pt taking her verapamil at 2000 and 2300. Pt with recent pacemaker placement in June. Pt arrived to ED in Afib at rate 146-157bpm. Pt denies chest pain and denies SOB since palpitations began.Pt takes Eliquis.

## 2022-07-12 NOTE — ED Notes (Signed)
Pt in bed, pt reports knee pain, re positioned pt, pt is a fib on monitor, resps even and unlabored

## 2022-07-12 NOTE — ED Provider Notes (Signed)
[facility] Provider Note    Event Date/Time   First MD Initiated Contact with Patient 07/12/22 0206     (approximate)   History   Palpitations   HPI  Meagan Wilcox is a 88 y.o. female with history of A-fib on Eliquis, located by bradycardia status post dual-chamber pacemaker in June 2023, CHF, hypertension, hyperlipidemia, hypothyroidism presents emergency department palpitations that started while at rest at 7:30 PM.  No chest pain, shortness of breath, vomiting, dizziness.  Reports compliance with her medications.  States she has been cardioverted in the hospital 4 times previously.  He is on diltiazem at home.   History provided by patient and EMS.    Past Medical History:  Diagnosis Date   (HFpEF) heart failure with preserved ejection fraction (HCC)    a. 01/2021 Echo: EF 55-60%, no rwma, GrII DD, nl RV fxn, mod elev PASP. Mild MR, mild-mod TR.   Abnormal mammogram, unspecified 2011   Arthritis 2002   Chronic pain syndrome 08/01/2017   GERD (gastroesophageal reflux disease)    Heart murmur 1985   Hyperlipidemia    Hypertension 2005   Hypothyroidism    Persistent atrial fibrillation/Atypical atrial flutter (HCC)    a. 04/2019 TEE/DCCV; b. 05/2019 Recurrent Aflutter-->Amio-->DCCV; c. CHA2DS2VASc = 4-->Eliquis; d. 08/2021 recurrent Afib req DCCV.  Flecainide started; e. 09/2021 Flecainide d/c'd 2/2 brady - couldn't tol bb.   PSVT (paroxysmal supraventricular tachycardia)    a. 09/2021 Zio: Predominantly sinus rhythm (67; range 49-102).  Rare PACs with occasional PVCs (PVC burden 3.6%).  44 SVT runs-fastest 190, longest 12.9 seconds.  No sustained arrhythmias/prolonged pauses.  No significant arrhythmia identified corresponding to patient's symptoms.    Past Surgical History:  Procedure Laterality Date   APPENDECTOMY  1939   BREAST BIOPSY Left 02-24-14   benign   BREAST BIOPSY Left 02/11/2016   neg   BREAST BIOPSY Left 2013   neg   BREAST  EXCISIONAL BIOPSY Left 2014   neg   BREAST SURGERY Left 2013   stereotactic biopsy    BREAST SURGERY Left May 2010   1 mm foci of atypical ductal hyperplasia   CARDIOVERSION N/A 05/08/2019   Procedure: CARDIOVERSION;  Surgeon: Debbe Odea, MD;  Location: [facility] ORS;  Service: Cardiovascular;  Laterality: N/A;   CARDIOVERSION N/A 05/31/2019   Procedure: CARDIOVERSION;  Surgeon: Antonieta Iba, MD;  Location: [facility] ORS;  Service: Cardiovascular;  Laterality: N/A;   CARDIOVERSION N/A 08/25/2021   Procedure: CARDIOVERSION;  Surgeon: Debbe Odea, MD;  Location: [facility] ORS;  Service: Cardiovascular;  Laterality: N/A;   CARDIOVERSION N/A 12/29/2021   Procedure: CARDIOVERSION;  Surgeon: Debbe Odea, MD;  Location: [facility] ORS;  Service: Cardiovascular;  Laterality: N/A;   cataract surgery  Bilateral 2009   COLONOSCOPY  2010   Dr. Mechele Collin   left breast biopsy   2009   PACEMAKER IMPLANT N/A 01/26/2022   Procedure: PACEMAKER IMPLANT;  Surgeon: Duke Salvia, MD;  Location: [facility] INVASIVE CV LAB;  Service: Cardiovascular;  Laterality: N/A;   TEE WITHOUT CARDIOVERSION N/A 05/08/2019   Procedure: TRANSESOPHAGEAL ECHOCARDIOGRAM (TEE);  Surgeon: Debbe Odea, MD;  Location: [facility] ORS;  Service: Cardiovascular;  Laterality: N/A;    MEDICATIONS:  Prior to Admission medications   Medication Sig Start Date End Date Taking? Authorizing Provider  acetaminophen (TYLENOL) 325 MG tablet Take 325 mg by mouth every 6 (six) hours as needed for mild pain, fever, headache or moderate pain.    [provider]  amitriptyline (ELAVIL) 25 MG tablet TAKE 1 TABLET BY MOUTH DAILY 01/24/22   Cletis Athens, MD  apixaban (ELIQUIS) 5 MG TABS tablet TAKE 1 TABLET(5 MG) BY MOUTH TWICE DAILY Patient taking differently: Take 5 mg by mouth 2 (two) times daily. 06/23/22   End, Harrell Gave, MD  BIOTIN PO Take 1 tablet by mouth daily.    [provider]  Camphor-Menthol-Methyl Sal (SALONPAS) 3.08-20-08 %  PTCH Place 1 patch onto the skin daily as needed (back pain).    [provider]  diltiazem (CARDIZEM CD) 180 MG 24 hr capsule Take 1 capsule (180 mg total) by mouth daily. 06/28/22 09/26/22  Enzo Bi, MD  furosemide (LASIX) 20 MG tablet TAKE 1 TABLET(20 MG) BY MOUTH DAILY Patient taking differently: Take 20 mg by mouth daily. 06/23/22   End, Harrell Gave, MD  levothyroxine (SYNTHROID) 125 MCG tablet TAKE 1 TABLET(125 MCG) BY MOUTH DAILY Patient taking differently: Take 125 mcg by mouth daily before breakfast. 06/27/21   Cletis Athens, MD  LORazepam (ATIVAN) 0.5 MG tablet Take 1 tablet by mouth every 12 (twelve) hours as needed for anxiety. 09/22/21   [provider]  losartan (COZAAR) 50 MG tablet Take 1 tablet (50 mg total) by mouth daily. 05/25/22 05/20/23  End, Harrell Gave, MD  melatonin 5 MG TABS Take 1 tablet (5 mg total) by mouth at bedtime. 08/31/21   Sidney Ace, MD  Multiple Vitamin (MULTIVITAMIN) capsule Take 1 capsule by mouth daily.    [provider]  omeprazole (PRILOSEC) 20 MG capsule TAKE 1 CAPSULE BY MOUTH DAILY 03/17/22   Cletis Athens, MD  pentosan polysulfate (ELMIRON) 100 MG capsule Take 100 mg by mouth 2 (two) times daily.    [provider]  psyllium (METAMUCIL SMOOTH TEXTURE) 28 % packet Take 1 packet by mouth daily at 8 pm.    [provider]  rosuvastatin (CRESTOR) 5 MG tablet Take 1 tablet (5 mg total) by mouth daily. 07/06/22 10/04/22  Gerrie Nordmann, NP  simvastatin (ZOCOR) 20 MG tablet Take 20 mg by mouth daily.    [provider]  verapamil (CALAN) 40 MG tablet Take 1 tablet (40 mg) by mouth every 4 hours up to 3 doses as needed for recurrent tachycardia 12/02/21   Deboraha Sprang, MD    Physical Exam   Triage Vital Signs: ED Triage Vitals  Enc Vitals Group     BP 07/12/22 0208 137/80     Pulse Rate 07/12/22 0208 (!) 151     Resp 07/12/22 0208 18     Temp 07/12/22 0208 98.2 F (36.8 C)     Temp Source  07/12/22 0208 Oral     SpO2 07/12/22 0208 96 %     Weight 07/12/22 0157 151 lb 14.4 oz (68.9 kg)     Height 07/12/22 0157 5\' 3"  (1.6 m)     Head Circumference --      Peak Flow --      Pain Score --      Pain Loc --      Pain Edu? --      Excl. in [place]? --     Most recent vital signs: Vitals:   07/12/22 0208 07/12/22 0300  BP: 137/80 139/81  Pulse: (!) 151 (!) 121  Resp: 18 (!) 22  Temp: 98.2 F (36.8 C)   SpO2: 96% 97%    CONSTITUTIONAL: Alert and oriented and responds appropriately to questions. Well-appearing; well-nourished HEAD: Normocephalic, atraumatic EYES: Conjunctivae clear, pupils appear equal,  sclera nonicteric ENT: normal nose; moist mucous membranes NECK: Supple, normal ROM CARD: Irregularly irregular and tachycardic; S1 and S2 appreciated; no murmurs, no clicks, no rubs, no gallops RESP: Normal chest excursion without splinting or tachypnea; breath sounds clear and equal bilaterally; no wheezes, no rhonchi, no rales, no hypoxia or respiratory distress, speaking full sentences ABD/GI: Normal bowel sounds; non-distended; soft, non-tender, no rebound, no guarding, no peritoneal signs BACK: The back appears normal EXT: Normal ROM in all joints; no deformity noted, no edema; no cyanosis, no calf tenderness or calf swelling SKIN: Normal color for age and race; warm; no rash on exposed skin NEURO: Moves all extremities equally, normal speech PSYCH: The patient's mood and manner are appropriate.   ED Results / Procedures / Treatments   LABS: (all labs ordered are listed, but only abnormal results are displayed) Labs Reviewed  BASIC METABOLIC PANEL - Abnormal; Notable for the following components:      Result Value   Glucose, Bld 128 (*)    All other components within normal limits  CBC  MAGNESIUM  TSH  BRAIN NATRIURETIC PEPTIDE  TROPONIN I (HIGH SENSITIVITY)     EKG:  EKG Interpretation  Date/Time:  Tuesday July 12 2022 02:02:58 EST Ventricular  Rate:  146 PR Interval:    QRS Duration: 86 QT Interval:  298 QTC Calculation: 465 R Axis:   -39 Text Interpretation: Atrial fibrillation with rapid V-rate Left axis deviation RSR' in V1 or V2, probably normal variant Repolarization abnormality, prob rate related Confirmed by Pryor Curia [phone]) on 07/12/2022 2:36:36 AM         RADIOLOGY: My personal review and interpretation of imaging: Chest x-ray clear.  I have personally reviewed all radiology reports.   DG Chest Port 1 View  Result Date: 07/12/2022 CLINICAL DATA:  Palpitations. EXAM: PORTABLE CHEST 1 VIEW COMPARISON:  June 27, 2022 FINDINGS: A dual lead AICD is noted with stable lead wire positioning. The heart size and mediastinal contours are within normal limits. A mild, chronic appearing increased lung markings are present. Mild atelectasis is seen within the bilateral lung bases. There is no evidence of a pleural effusion or pneumothorax. The visualized skeletal structures are unremarkable. IMPRESSION: Chronic appearing increased lung markings with mild bibasilar atelectasis. Electronically Signed   By: Virgina Norfolk M.D.   On: 07/12/2022 02:28     PROCEDURES:  Critical Care performed: Yes, see critical care procedure note(s)   CRITICAL CARE Performed by: Cyril Mourning Teena Mangus   Total critical care time: 45 minutes  Critical care time was exclusive of separately billable procedures and treating other patients.  Critical care was necessary to treat or prevent imminent or life-threatening deterioration.  Critical care was time spent personally by me on the following activities: development of treatment plan with patient and/or surrogate as well as nursing, discussions with consultants, evaluation of patient's response to treatment, examination of patient, obtaining history from patient or surrogate, ordering and performing treatments and interventions, ordering and review of laboratory studies, ordering and review of  radiographic studies, pulse oximetry and re-evaluation of patient's condition.   Marland Kitchen1-3 Lead EKG Interpretation  Performed by: Brentlee Sciara, Delice Bison, DO Authorized by: Lavanda Nevels, Delice Bison, DO     Interpretation: abnormal     ECG rate:  148   ECG rate assessment: tachycardic     Rhythm: atrial fibrillation     Ectopy: none     Conduction: normal       IMPRESSION / MDM / ASSESSMENT AND PLAN /  ED COURSE  I reviewed the triage vital signs and the nursing notes.    Patient here with A-fib with RVR.  The patient is on the cardiac monitor to evaluate for evidence of arrhythmia and/or significant heart rate changes.   DIFFERENTIAL DIAGNOSIS (includes but not limited to):   A-fib with RVR, anemia, thyroid dysfunction, electrolyte derangement, ACS, PE, CHF   Patient's presentation is most consistent with acute presentation with potential threat to life or bodily function.   PLAN: We will obtain CBC, BMP, TSH, magnesium level, troponin, BNP, chest x-ray.  Discussed with patient that given she is compliant with her medication she would be a candidate for cardioversion from the ED she states she does not think she would want this at this time and would like to talk to her family further.  Will start diltiazem and plan on admission unless she changes her mind and wants electrocardioversion in the ED.   MEDICATIONS GIVEN IN ED: Medications  diltiazem (CARDIZEM) 1 mg/mL load via infusion 10 mg (10 mg Intravenous Bolus from Bag 07/12/22 0302)    And  diltiazem (CARDIZEM) 125 mg in dextrose 5% 125 mL (1 mg/mL) infusion (5 mg/hr Intravenous New Bag/Given 07/12/22 0302)     ED COURSE: Labs show normal electrolytes, normal hemoglobin, negative troponin.  Negative BNP.  Chest x-ray reviewed and interpreted by myself and radiologist and shows no acute findings.  Heart rate slowly improving on diltiazem as is her blood pressure.  Will discuss with hospitalist for admission.   CONSULTS:  Consulted and  discussed patient's case with hospitalist, Dr. Damita Dunnings.  I have recommended admission and consulting physician agrees and will place admission orders.  Patient (and family if present) agree with this plan.   I reviewed all nursing notes, vitals, pertinent previous records.  All labs, EKGs, imaging ordered have been independently reviewed and interpreted by myself.    OUTSIDE RECORDS REVIEWED: Patient last admitted November 13 for the same.       FINAL CLINICAL IMPRESSION(S) / ED DIAGNOSES   Final diagnoses:  Atrial fibrillation with RVR ([place])     Rx / DC Orders   ED Discharge Orders     None        Note:  This document was prepared using Dragon voice recognition software and may include unintentional dictation errors.   Bunyan Brier, Delice Bison, DO 07/12/22 [phone]

## 2022-07-12 NOTE — H&P (Signed)
History and Physical    Patient: Meagan Wilcox FGH:[id number] DOB: 1930-12-22 DOA: 07/12/2022 DOS: the patient was seen and examined on 07/12/2022 PCP: Lauro Regulus, MD  Patient coming from: Home  Chief Complaint:  Chief Complaint  Patient presents with   Palpitations    HPI: Meagan Wilcox is a 88 y.o. female with medical history significant for paroxysmal atrial fibrillation/flutter complicated by bradycardia requiring pacemaker (01/2022), and with history of prior cardioversions, chronic HFpEF, hypertension, hyperlipidemia, hypothyroidism, chronic fatigue, recently hospitalized from 11/13 to 11/14 with rapid A-fib that converted on diltiazem infusion, discharged with an increased home dose of diltiazem 180 as well as verapamil 40 mg as needed, who presents to the ED with palpitations that started suddenly while at rest at 7:30 PM on 11/27.  She denies associated chest pain, shortness of breath, nausea vomiting or diaphoresis.  She reports compliance with her medication.  She recently saw her cardiologist in follow-up on 11/22. ED course and data review: Pulse 151 with otherwise normal vitals.  Troponin 10, BNP 99.  CBC, BMP, TSH and magnesium all unremarkable. EKG, personally viewed and interpreted showing A-fib at 146.  Chest x-ray with chronic appearing increased lung markings with mild bibasilar atelectasis.   Patient was treated with a diltiazem infusion 10 mg without improvement.  Cardioversion was offered but patient declined.  She was subsequently started on a diltiazem infusion and hospitalist consulted.   Review of Systems: As mentioned in the history of present illness. All other systems reviewed and are negative.  Past Medical History:  Diagnosis Date   (HFpEF) heart failure with preserved ejection fraction (HCC)    a. 01/2021 Echo: EF 55-60%, no rwma, GrII DD, nl RV fxn, mod elev PASP. Mild MR, mild-mod TR.   Abnormal mammogram, unspecified 2011   Arthritis 2002    Chronic pain syndrome 08/01/2017   GERD (gastroesophageal reflux disease)    Heart murmur 1985   Hyperlipidemia    Hypertension 2005   Hypothyroidism    Persistent atrial fibrillation/Atypical atrial flutter (HCC)    a. 04/2019 TEE/DCCV; b. 05/2019 Recurrent Aflutter-->Amio-->DCCV; c. CHA2DS2VASc = 4-->Eliquis; d. 08/2021 recurrent Afib req DCCV.  Flecainide started; e. 09/2021 Flecainide d/c'd 2/2 brady - couldn't tol bb.   PSVT (paroxysmal supraventricular tachycardia)    a. 09/2021 Zio: Predominantly sinus rhythm (67; range 49-102).  Rare PACs with occasional PVCs (PVC burden 3.6%).  44 SVT runs-fastest 190, longest 12.9 seconds.  No sustained arrhythmias/prolonged pauses.  No significant arrhythmia identified corresponding to patient's symptoms.   Past Surgical History:  Procedure Laterality Date   APPENDECTOMY  1939   BREAST BIOPSY Left 02-24-14   benign   BREAST BIOPSY Left 02/11/2016   neg   BREAST BIOPSY Left 2013   neg   BREAST EXCISIONAL BIOPSY Left 2014   neg   BREAST SURGERY Left 2013   stereotactic biopsy    BREAST SURGERY Left May 2010   1 mm foci of atypical ductal hyperplasia   CARDIOVERSION N/A 05/08/2019   Procedure: CARDIOVERSION;  Surgeon: Debbe Odea, MD;  Location: [facility] ORS;  Service: Cardiovascular;  Laterality: N/A;   CARDIOVERSION N/A 05/31/2019   Procedure: CARDIOVERSION;  Surgeon: Antonieta Iba, MD;  Location: [facility];  Service: Cardiovascular;  Laterality: N/A;   CARDIOVERSION N/A 08/25/2021   Procedure: CARDIOVERSION;  Surgeon: Debbe Odea, MD;  Location: [facility] OR[facility];  Service: Cardiovascular;  Laterality: N/A;   CARDIOVERSION N/A 12/29/2021   Procedure: CARDIOVERSION;  Surgeon: Debbe Odea, MD;  Location:  [facility] OR[facility];  Service: Cardiovascular;  Laterality: N/A;   cataract surgery  Bilateral 2009   COLONOSCOPY  2010   Dr. Mechele Collin   left breast biopsy   2009   PACEMAKER IMPLANT N/A 01/26/2022   Procedure: PACEMAKER IMPLANT;  Surgeon:  Duke Salvia, MD;  Location: [facility] INVASIVE CV LAB;  Service: Cardiovascular;  Laterality: N/A;   TEE WITHOUT CARDIOVERSION N/A 05/08/2019   Procedure: TRANSESOPHAGEAL ECHOCARDIOGRAM (TEE);  Surgeon: Debbe Odea, MD;  Location: [facility] ORS;  Service: Cardiovascular;  Laterality: N/A;   Social History:  reports that she has never smoked. She has never used smokeless tobacco. She reports that she does not drink alcohol and does not use drugs.  Allergies  Allergen Reactions   Erythromycin Other (See Comments)   Other Other (See Comments)   Amoxicillin Diarrhea   Penicillins Rash    Did it involve swelling of the face/tongue/throat, SOB, or low BP? No Did it involve sudden or severe rash/hives, skin peeling, or any reaction on the inside of your mouth or nose? No Did you need to seek medical attention at a hospital or doctor's office? Yes When did it last happen?      65 years ago If all above answers are "NO", may proceed with cephalosporin use.    Family History  Problem Relation Age of Onset   Heart disease Mother    Heart disease Father    Diabetes Father    Stroke Father    Arthritis Father     Prior to Admission medications   Medication Sig Start Date End Date Taking? Authorizing Provider  acetaminophen (TYLENOL) 325 MG tablet Take 325 mg by mouth every 6 (six) hours as needed for mild pain, fever, headache or moderate pain.    [provider]  amitriptyline (ELAVIL) 25 MG tablet TAKE 1 TABLET BY MOUTH DAILY 01/24/22   Corky Downs, MD  apixaban (ELIQUIS) 5 MG TABS tablet TAKE 1 TABLET(5 MG) BY MOUTH TWICE DAILY Patient taking differently: Take 5 mg by mouth 2 (two) times daily. 06/23/22   End, Cristal Deer, MD  BIOTIN PO Take 1 tablet by mouth daily.    [provider]  Camphor-Menthol-Methyl Sal (SALONPAS) 3.08-20-08 % PTCH Place 1 patch onto the skin daily as needed (back pain).    [provider]  diltiazem (CARDIZEM CD) 180 MG 24 hr capsule Take 1  capsule (180 mg total) by mouth daily. 06/28/22 09/26/22  Darlin Priestly, MD  furosemide (LASIX) 20 MG tablet TAKE 1 TABLET(20 MG) BY MOUTH DAILY Patient taking differently: Take 20 mg by mouth daily. 06/23/22   End, Cristal Deer, MD  levothyroxine (SYNTHROID) 125 MCG tablet TAKE 1 TABLET(125 MCG) BY MOUTH DAILY Patient taking differently: Take 125 mcg by mouth daily before breakfast. 06/27/21   Corky Downs, MD  LORazepam (ATIVAN) 0.5 MG tablet Take 1 tablet by mouth every 12 (twelve) hours as needed for anxiety. 09/22/21   [provider]  losartan (COZAAR) 50 MG tablet Take 1 tablet (50 mg total) by mouth daily. 05/25/22 05/20/23  End, Cristal Deer, MD  melatonin 5 MG TABS Take 1 tablet (5 mg total) by mouth at bedtime. 08/31/21   Tresa Moore, MD  Multiple Vitamin (MULTIVITAMIN) capsule Take 1 capsule by mouth daily.    [provider]  omeprazole (PRILOSEC) 20 MG capsule TAKE 1 CAPSULE BY MOUTH DAILY 03/17/22   Corky Downs, MD  pentosan polysulfate (ELMIRON) 100 MG capsule Take 100 mg by mouth 2 (two) times daily.  [provider]  psyllium (METAMUCIL SMOOTH TEXTURE) 28 % packet Take 1 packet by mouth daily at 8 pm.    [provider]  rosuvastatin (CRESTOR) 5 MG tablet Take 1 tablet (5 mg total) by mouth daily. 07/06/22 10/04/22  Charlsie Quest, NP  simvastatin (ZOCOR) 20 MG tablet Take 20 mg by mouth daily.    [provider]  verapamil (CALAN) 40 MG tablet Take 1 tablet (40 mg) by mouth every 4 hours up to 3 doses as needed for recurrent tachycardia 12/02/21   Duke Salvia, MD    Physical Exam: Vitals:   07/12/22 0157 07/12/22 0208 07/12/22 0300  BP:  137/80 139/81  Pulse:  (!) 151 (!) 121  Resp:  18 (!) 22  Temp:  98.2 F (36.8 C)   TempSrc:  Oral   SpO2:  96% 97%  Weight: 68.9 kg    Height: 5\' 3"  (1.6 m)     Physical Exam Vitals and nursing note reviewed.  Constitutional:      General: She is not in acute distress. HENT:      Head: Normocephalic and atraumatic.  Cardiovascular:     Rate and Rhythm: Tachycardia present. Rhythm irregular.     Heart sounds: Normal heart sounds.  Pulmonary:     Effort: Pulmonary effort is normal.     Breath sounds: Normal breath sounds.  Abdominal:     Palpations: Abdomen is soft.     Tenderness: There is no abdominal tenderness.  Neurological:     Mental Status: Mental status is at baseline.     Labs on Admission: I have personally reviewed following labs and imaging studies  CBC: Recent Labs  Lab 07/12/22 0211  WBC 8.2  HGB 12.5  HCT 37.5  MCV 90.8  PLT 222   Basic Metabolic Panel: Recent Labs  Lab 07/12/22 0210 07/12/22 0211  NA  --  139  K  --  3.8  CL  --  106  CO2  --  25  GLUCOSE  --  128*  BUN  --  19  CREATININE  --  0.80  CALCIUM  --  9.7  MG 2.1  --    GFR: Estimated Creatinine Clearance: 44.4 mL/min (by C-G formula based on SCr of 0.8 mg/dL). Liver Function Tests: No results for input(s): "AST", "ALT", "ALKPHOS", "BILITOT", "PROT", "ALBUMIN" in the last 168 hours. No results for input(s): "LIPASE", "AMYLASE" in the last 168 hours. No results for input(s): "AMMONIA" in the last 168 hours. Coagulation Profile: No results for input(s): "INR", "PROTIME" in the last 168 hours. Cardiac Enzymes: No results for input(s): "CKTOTAL", "CKMB", "CKMBINDEX", "TROPONINI" in the last 168 hours. BNP (last 3 results) No results for input(s): "PROBNP" in the last 8760 hours. HbA1C: No results for input(s): "HGBA1C" in the last 72 hours. CBG: No results for input(s): "GLUCAP" in the last 168 hours. Lipid Profile: No results for input(s): "CHOL", "HDL", "LDLCALC", "TRIG", "CHOLHDL", "LDLDIRECT" in the last 72 hours. Thyroid Function Tests: Recent Labs    07/12/22 0210  TSH 0.645   Anemia Panel: No results for input(s): "VITAMINB12", "FOLATE", "FERRITIN", "TIBC", "IRON", "RETICCTPCT" in the last 72 hours. Urine analysis:    Component Value Date/Time    COLORURINE STRAW (A) 12/29/2021 0017   APPEARANCEUR CLEAR (A) 12/29/2021 0017   LABSPEC 1.006 12/29/2021 0017   PHURINE 5.0 12/29/2021 0017   GLUCOSEU NEGATIVE 12/29/2021 0017   HGBUR MODERATE (A) 12/29/2021 0017   BILIRUBINUR NEGATIVE 12/29/2021 0017  KETONESUR NEGATIVE 12/29/2021 0017   PROTEINUR NEGATIVE 12/29/2021 0017   NITRITE NEGATIVE 12/29/2021 0017   LEUKOCYTESUR NEGATIVE 12/29/2021 0017    Radiological Exams on Admission: DG Chest Port 1 View  Result Date: 07/12/2022 CLINICAL DATA:  Palpitations. EXAM: PORTABLE CHEST 1 VIEW COMPARISON:  June 27, 2022 FINDINGS: A dual lead AICD is noted with stable lead wire positioning. The heart size and mediastinal contours are within normal limits. A mild, chronic appearing increased lung markings are present. Mild atelectasis is seen within the bilateral lung bases. There is no evidence of a pleural effusion or pneumothorax. The visualized skeletal structures are unremarkable. IMPRESSION: Chronic appearing increased lung markings with mild bibasilar atelectasis. Electronically Signed   By: Aram Candelahaddeus  Houston M.D.   On: 07/12/2022 02:28     Data Reviewed: Relevant notes from primary care and specialist visits, past discharge summaries as available in EHR, including Care Everywhere. Prior diagnostic testing as pertinent to current admission diagnoses Updated medications and problem lists for reconciliation ED course, including vitals, labs, imaging, treatment and response to treatment Triage notes, nursing and pharmacy notes and ED provider's notes Notable results as noted in HPI     Assessment and Plan: * Rapid atrial fibrillation (HCC) Continue diltiazem infusion Continue apixaban Will hold home as needed verapamil Cardiology consult Will keep n.p.o. in case cardioversion will be attempted   Cardiac pacemaker 01/26/22 Indicated for symptomatic bradycardia Patient is followed by Dr. Graciela HusbandsKlein   Hypothyroidism Continue  levothyroxine   Chronic heart failure with preserved ejection fraction (HFpEF) (HCC) Clinically euvolemic Continue losartan and furosemide  Last EF 55 to 60% June 2022   Fatigue Chronic and stable   Essential hypertension Controlled             DVT prophylaxis: Apixaban  Consults: Cardiology, [facility] Dr. Jens Somrenshaw  Advance Care Planning:   Code Status: Prior   Family Communication: none  Disposition Plan: Back to previous home environment  Severity of Illness: The appropriate patient status for this patient is INPATIENT. Inpatient status is judged to be reasonable and necessary in order to provide the required intensity of service to ensure the patient's safety. The patient's presenting symptoms, physical exam findings, and initial radiographic and laboratory data in the context of their chronic comorbidities is felt to place them at high risk for further clinical deterioration. Furthermore, it is not anticipated that the patient will be medically stable for discharge from the hospital within 2 midnights of admission.   * I certify that at the point of admission it is my clinical judgment that the patient will require inpatient hospital care spanning beyond 2 midnights from the point of admission due to high intensity of service, high risk for further deterioration and high frequency of surveillance required.*  Author: Andris BaumannHazel V Semaje Kinker, MD 07/12/2022 3:40 AM  For on call review [email or web address].

## 2022-07-14 ENCOUNTER — Ambulatory Visit: Payer: Medicare PPO | Attending: Internal Medicine | Admitting: Internal Medicine

## 2022-07-14 ENCOUNTER — Encounter: Payer: Self-pay | Admitting: Internal Medicine

## 2022-07-14 VITALS — BP 130/52 | HR 65 | Ht 63.0 in | Wt 153.0 lb

## 2022-07-14 DIAGNOSIS — I10 Essential (primary) hypertension: Secondary | ICD-10-CM | POA: Diagnosis not present

## 2022-07-14 DIAGNOSIS — I480 Paroxysmal atrial fibrillation: Secondary | ICD-10-CM | POA: Diagnosis not present

## 2022-07-14 DIAGNOSIS — R001 Bradycardia, unspecified: Secondary | ICD-10-CM

## 2022-07-14 DIAGNOSIS — I493 Ventricular premature depolarization: Secondary | ICD-10-CM

## 2022-07-14 DIAGNOSIS — Z950 Presence of cardiac pacemaker: Secondary | ICD-10-CM | POA: Diagnosis not present

## 2022-07-14 NOTE — Progress Notes (Signed)
Patient Care Team: Lauro Regulus, MD as PCP - General (Internal Medicine) End, Cristal Deer, MD as PCP - Cardiology (Cardiology) Lemar Livings, Merrily Pew, MD (General Surgery) Darcey Nora, MD (Unknown Physician Specialty)   HPI  Meagan Wilcox is a 88 y.o. female seen in follow-up for atrial fibrillation/flutter intermittent bradycardia, rhythm control and rate control strategies having been largely of failure secondary to progressive bradycardia, concerns about aggravating bradycardia.  (She comes in with her niece Meagan Wilcox) she even failed ISA beta-blockers.  With issues of tacky bradycardia and symptomatic bradycardia particularly, discussions were had regarding antiarrhythmic therapy probably limited dofetilide given her age and the bradycardia and backup bradycardia pacing with a rate control strategy.  She elected the latter and underwent pacing Abbott  for rate controlling strategy 6/23  Not tolerated amiodarone and or flecainide.  Recurrent emergency room 11/23 with rapid atrial fibrillation>> HR 146>>DCCV associated with lightheadedness and tachypalpitations.  Occurred later the day following being informed of the death of her sister    Mean heart rate in sinus was 67 with a minimum of 49.    Following pacemaker implantation has had no more of the very severe weakness spells that are largely immobilizing, she does continue to complain of fatigue.  Ambulation is limited by leg pain also.    DATE TEST EF    9/20 Echo   55-60 % MR mild-mod  6/22 Echo   55-60 % DDysfunction Gd 2             Date Cr K Hgb  1/23 1.13 4.5  13.2   5/23 0.97 3.8 11.2  11/23 0.8 3.8 12.5   Thromboembolic risk factors ( age -40, HTN-1, Gender-1) for a CHADSVASc Score of >=4    Records and Results Reviewed   Past Medical History:  Diagnosis Date   (HFpEF) heart failure with preserved ejection fraction (HCC)    a. 01/2021 Echo: EF 55-60%, no rwma, GrII DD, nl RV fxn, mod elev  PASP. Mild MR, mild-mod TR.   Abnormal mammogram, unspecified 2011   Arthritis 2002   Chronic pain syndrome 08/01/2017   GERD (gastroesophageal reflux disease)    Heart murmur 1985   Hyperlipidemia    Hypertension 2005   Hypothyroidism    Persistent atrial fibrillation/Atypical atrial flutter (HCC)    a. 04/2019 TEE/DCCV; b. 05/2019 Recurrent Aflutter-->Amio-->DCCV; c. CHA2DS2VASc = 4-->Eliquis; d. 08/2021 recurrent Afib req DCCV.  Flecainide started; e. 09/2021 Flecainide d/c'd 2/2 brady - couldn't tol bb.   PSVT (paroxysmal supraventricular tachycardia)    a. 09/2021 Zio: Predominantly sinus rhythm (67; range 49-102).  Rare PACs with occasional PVCs (PVC burden 3.6%).  44 SVT runs-fastest 190, longest 12.9 seconds.  No sustained arrhythmias/prolonged pauses.  No significant arrhythmia identified corresponding to patient's symptoms.    Past Surgical History:  Procedure Laterality Date   APPENDECTOMY  1939   BREAST BIOPSY Left 02-24-14   benign   BREAST BIOPSY Left 02/11/2016   neg   BREAST BIOPSY Left 2013   neg   BREAST EXCISIONAL BIOPSY Left 2014   neg   BREAST SURGERY Left 2013   stereotactic biopsy    BREAST SURGERY Left May 2010   1 mm foci of atypical ductal hyperplasia   CARDIOVERSION N/A 05/08/2019   Procedure: CARDIOVERSION;  Surgeon: Debbe Odea, MD;  Location: [facility] ORS;  Service: Cardiovascular;  Laterality: N/A;   CARDIOVERSION N/A 05/31/2019   Procedure: CARDIOVERSION;  Surgeon: Antonieta Iba, MD;  Location: [facility] ORS;  Service: Cardiovascular;  Laterality: N/A;   CARDIOVERSION N/A 08/25/2021   Procedure: CARDIOVERSION;  Surgeon: Debbe Odea, MD;  Location: [facility] ORS;  Service: Cardiovascular;  Laterality: N/A;   CARDIOVERSION N/A 12/29/2021   Procedure: CARDIOVERSION;  Surgeon: Debbe Odea, MD;  Location: [facility] OR[facility];  Service: Cardiovascular;  Laterality: N/A;   cataract surgery  Bilateral 2009   COLONOSCOPY  2010   Dr. Mechele Collin   left breast biopsy    2009   PACEMAKER IMPLANT N/A 01/26/2022   Procedure: PACEMAKER IMPLANT;  Surgeon: Duke Salvia, MD;  Location: [facility] INVASIVE CV LAB;  Service: Cardiovascular;  Laterality: N/A;   TEE WITHOUT CARDIOVERSION N/A 05/08/2019   Procedure: TRANSESOPHAGEAL ECHOCARDIOGRAM (TEE);  Surgeon: Debbe Odea, MD;  Location: [facility] ORS;  Service: Cardiovascular;  Laterality: N/A;    Current Meds  Medication Sig   acetaminophen (TYLENOL) 325 MG tablet Take 325 mg by mouth every 6 (six) hours as needed for mild pain, fever, headache or moderate pain.   amitriptyline (ELAVIL) 25 MG tablet TAKE 1 TABLET BY MOUTH DAILY   apixaban (ELIQUIS) 5 MG TABS tablet TAKE 1 TABLET(5 MG) BY MOUTH TWICE DAILY   BIOTIN PO Take 1 tablet by mouth daily.   Camphor-Menthol-Methyl Sal (SALONPAS) 3.08-20-08 % PTCH Place 1 patch onto the skin daily as needed (back pain).   diltiazem (CARDIZEM CD) 180 MG 24 hr capsule Take 1 capsule (180 mg total) by mouth daily.   furosemide (LASIX) 20 MG tablet TAKE 1 TABLET(20 MG) BY MOUTH DAILY   levothyroxine (SYNTHROID) 125 MCG tablet TAKE 1 TABLET(125 MCG) BY MOUTH DAILY   LORazepam (ATIVAN) 0.5 MG tablet Take 1 tablet by mouth every 12 (twelve) hours as needed for anxiety.   losartan (COZAAR) 50 MG tablet Take 1 tablet (50 mg total) by mouth daily.   melatonin 5 MG TABS Take 1 tablet (5 mg total) by mouth at bedtime as needed. Home med.   Multiple Vitamin (MULTIVITAMIN) capsule Take 1 capsule by mouth daily.   omeprazole (PRILOSEC) 20 MG capsule TAKE 1 CAPSULE BY MOUTH DAILY   pentosan polysulfate (ELMIRON) 100 MG capsule Take 100 mg by mouth 2 (two) times daily.   psyllium (METAMUCIL SMOOTH TEXTURE) 28 % packet Take 1 packet by mouth daily at 8 pm.   rosuvastatin (CRESTOR) 5 MG tablet Take 1 tablet (5 mg total) by mouth daily.   verapamil (CALAN) 40 MG tablet Take 1 tablet (40 mg) by mouth every 4 hours up to 3 doses as needed for recurrent tachycardia    Allergies  Allergen Reactions    Erythromycin Other (See Comments)   Other Other (See Comments)   Amoxicillin Diarrhea   Penicillins Rash    Did it involve swelling of the face/tongue/throat, SOB, or low BP? No Did it involve sudden or severe rash/hives, skin peeling, or any reaction on the inside of your mouth or nose? No Did you need to seek medical attention at a hospital or doctor's office? Yes When did it last happen?      65 years ago If all above answers are "NO", may proceed with cephalosporin use.      Review of Systems negative except from HPI and PMH  Physical Exam BP (!) 130/52 (BP Location: Left Arm, Patient Position: Sitting, Cuff Size: Normal)   Pulse 65   Ht 5\' 3"  (1.6 m)   Wt 153 lb (69.4 kg)   SpO2 96%   BMI 27.10 kg/m  Well developed and well  nourished in no acute distress HENT normal Neck supple with JVP-flat Clear Device pocket well healed; without hematoma or erythema.  There is no tethering  Regular rate and rhythm, no  gallop No / murmur Abd-soft with active BS No Clubbing cyanosis  edema Skin-warm and dry A & Oriented  Grossly normal sensory and motor function  ECG Apacing @ 65  Device function not interrgoated    ECG atrial paced at 65 Intervals 18/10/37 Voltage criteria for LVH  Estimated Creatinine Clearance: 44.6 mL/min (by C-G formula based on SCr of 0.8 mg/dL).   Assessment and  Plan  Atrial flutter atypical 2:1 conduction/ Atrial fib   Pacemaker-[employer] Jude DOI 6/23 (standard RV apical lead position)   HFpEF  Anemia   Bradycardia   HTN  PVCs  Fatigue  Recurrent atrial tachyarrhythmias palpitations and symptoms.  Discussed alternative strategies, ongoing efforts at rate control including AV junction ablation.  This would obviate the need for rate controlling drugs.  We discussed the downsides including procedural risks of death and vascular injury and a small possibility of the development of pacemaker cardiomyopathy given RV apical pacing  location  She will review it further with her niece and then let us know how she would like to proceed.  Current medicines are reviewed at length with the patient today .  The patient does not  have concerns regarding medicines.

## 2022-07-14 NOTE — Patient Instructions (Signed)
Medication Instructions:  - Your physician recommends that you continue on your current medications as directed. Please refer to the Current Medication list given to you today.  *If you need a refill on your cardiac medications before your next appointment, please call your pharmacy*   Lab Work: - none ordered  If you have labs (blood work) drawn today and your tests are completely normal, you will receive your results only by: MyChart Message (if you have MyChart) OR A paper copy in the mail If you have any lab test that is abnormal or we need to change your treatment, we will call you to review the results.   Testing/Procedures: - none ordered   Follow-Up: At [facility], you and your health needs are our priority.  As part of our continuing mission to provide you with exceptional heart care, we have created designated Provider Care Teams.  These Care Teams include your primary Cardiologist (physician) and Advanced Practice Providers (APPs -  Physician Assistants and Nurse Practitioners) who all work together to provide you with the care you need, when you need it.  We recommend signing up for the patient portal called "MyChart".  Sign up information is provided on this After Visit Summary.  MyChart is used to connect with patients for Virtual Visits (Telemedicine).  Patients are able to view lab/test results, encounter notes, upcoming appointments, etc.  Non-urgent messages can be sent to your provider as well.   To learn more about what you can do with MyChart, go to [email or web address].    Your next appointment:   June 2024   The format for your next appointment:   In Person  Provider:   Sherryl Manges, MD    Other Instructions  Please call the office if you decide you would like to proceed with the AV node ablation Herbert Seta, RN- [phone])  Important Information About Sugar

## 2022-07-19 ENCOUNTER — Telehealth: Payer: Self-pay | Admitting: Internal Medicine

## 2022-07-19 ENCOUNTER — Other Ambulatory Visit: Payer: Self-pay | Admitting: Internal Medicine

## 2022-07-19 NOTE — Telephone Encounter (Signed)
Pt's niece is requesting call back from [facility], RN to discuss ablation that Dr. Graciela Husbands is requesting pt have done. She states they have more questions and concerns and would like to discuss before they schedule.

## 2022-07-20 ENCOUNTER — Encounter: Payer: Self-pay | Admitting: Internal Medicine

## 2022-07-22 ENCOUNTER — Other Ambulatory Visit: Payer: Self-pay | Admitting: Internal Medicine

## 2022-07-27 ENCOUNTER — Ambulatory Visit (INDEPENDENT_AMBULATORY_CARE_PROVIDER_SITE_OTHER): Payer: Medicare PPO

## 2022-07-27 DIAGNOSIS — R001 Bradycardia, unspecified: Secondary | ICD-10-CM | POA: Diagnosis not present

## 2022-07-27 LAB — CUP PACEART REMOTE DEVICE CHECK
Battery Remaining Longevity: 110 mo
Battery Remaining Percentage: 95.5 %
Battery Voltage: 3.02 V
Brady Statistic AP VP Percent: 1 %
Brady Statistic AP VS Percent: 81 %
Brady Statistic AS VP Percent: 1 %
Brady Statistic AS VS Percent: 17 %
Brady Statistic RA Percent Paced: 81 %
Brady Statistic RV Percent Paced: 1.2 %
Date Time Interrogation Session: 20231213052932
Implantable Lead Connection Status: 753985
Implantable Lead Connection Status: 753985
Implantable Lead Implant Date: 20230614
Implantable Lead Implant Date: 20230614
Implantable Lead Location: 753859
Implantable Lead Location: 753860
Implantable Lead Model: 1944
Implantable Lead Model: 1948
Implantable Pulse Generator Implant Date: 20230614
Lead Channel Impedance Value: 440 Ohm
Lead Channel Impedance Value: 610 Ohm
Lead Channel Pacing Threshold Amplitude: 0.5 V
Lead Channel Pacing Threshold Amplitude: 0.625 V
Lead Channel Pacing Threshold Pulse Width: 0.5 ms
Lead Channel Pacing Threshold Pulse Width: 0.5 ms
Lead Channel Sensing Intrinsic Amplitude: 4.5 mV
Lead Channel Sensing Intrinsic Amplitude: 7.5 mV
Lead Channel Setting Pacing Amplitude: 0.875
Lead Channel Setting Pacing Amplitude: 2 V
Lead Channel Setting Pacing Pulse Width: 0.5 ms
Lead Channel Setting Sensing Sensitivity: 2 mV
Pulse Gen Model: 2272
Pulse Gen Serial Number: 8087558

## 2022-08-02 NOTE — Telephone Encounter (Signed)
Late entry- the patient's niece also sent a MyChart message on 07/21/22 with questions. Dr. Graciela Husbands responded to the patient/ her niece. Will send a follow up message today as I have not heard back from the patient/ her niece.

## 2022-08-23 ENCOUNTER — Telehealth: Payer: Self-pay

## 2022-08-23 ENCOUNTER — Other Ambulatory Visit: Payer: Self-pay

## 2022-08-23 DIAGNOSIS — I480 Paroxysmal atrial fibrillation: Secondary | ICD-10-CM

## 2022-08-23 NOTE — Progress Notes (Signed)
Remote pacemaker transmission.   

## 2022-08-23 NOTE — Telephone Encounter (Signed)
Work up complete for AV Node Ablation is complete. Pt will go to [facility] for labs the week of 09/12/22.  Spoke with pt's Niece and she would like her to stay as a 2nd case due to her being 89 years old and she lives a little over an hour away. She gets car sick and will have to stop a few times on her way to [facility].

## 2022-08-25 ENCOUNTER — Ambulatory Visit: Payer: Medicare PPO | Admitting: Internal Medicine

## 2022-08-30 NOTE — Telephone Encounter (Signed)
I called pt's Niece and let her know that I did find a person for the first case so I can leave her at 2nd case. I did have to move her time back and she is aware of the time change.  She will need to arrive at 7:30am for a 9:30am procedure time.  She was ok with this and was very appreciative for [place] being able to accommodate the pt.

## 2022-09-09 ENCOUNTER — Telehealth: Payer: Self-pay | Admitting: Internal Medicine

## 2022-09-09 NOTE — Telephone Encounter (Signed)
Pt niece called stating pt is having two teeth extracted the week before her ablation. She states the dental office is not requesting clearance but from their knowledge they want make to sure pt will be okay to have the extraction before her ablation

## 2022-09-09 NOTE — Telephone Encounter (Signed)
Called the patient she is scheduled to have dental surgery in a couple of weeks. Advised the patient the dental office will have to fax over Pre-Op cardiac clearance request to the office. Provide the fax number to the patient. Patient voiced understand.

## 2022-09-12 ENCOUNTER — Telehealth: Payer: Self-pay

## 2022-09-12 NOTE — Telephone Encounter (Signed)
   Primary Cardiologist: Nelva Bush, MD  Chart reviewed as part of pre-operative protocol coverage. Simple dental extractions (1-2 teeth) are considered low risk procedures per guidelines and generally do not require any specific cardiac clearance. It is also generally accepted that for simple extractions and dental cleanings, there is no need to interrupt blood thinner therapy.   Due to upcoming AV node ablation, I will forward request to primary cardiologist in the event that requesting provider requires that procedure be done with hold of Eliquis.   SBE prophylaxis is not required for the patient.  Please call with questions.  Emmaline Life, NP-C  09/12/2022, 4:09 PM [address] Office [phone] Fax [phone]

## 2022-09-12 NOTE — Telephone Encounter (Signed)
..  Pre-operative Risk Assessment    Patient Name: Meagan Wilcox  DOB: 06/07/1931 MRN: [id number]    2 Request for Surgical Clearance    Procedure:   removing abscessed #30, deep recurrent decay #31 with bone grafting   Date of Surgery:  09/20/22                                 Surgeon:  Jeneen Rinks belcher Surgeon's Group or Practice Name:  [facility] Phone number:  [phone] Fax number:  [phone]   Type of Clearance Requested:   - Medical  - Pharmacy:  Hold Apixaban (Eliquis)     Type of Anesthesia:  Local    Additional requests/questions:    Gwenlyn Found   09/12/2022, 3:29 PM

## 2022-09-13 ENCOUNTER — Encounter: Payer: Self-pay | Admitting: Internal Medicine

## 2022-09-13 NOTE — Telephone Encounter (Signed)
The patient may have her teeth removed before the ablation. GT

## 2022-09-13 NOTE — Telephone Encounter (Signed)
error 

## 2022-09-13 NOTE — Telephone Encounter (Signed)
We are waiting on pharm-d recommendations.

## 2022-09-13 NOTE — Telephone Encounter (Signed)
Patient calling to follow up on clearance. 

## 2022-09-14 ENCOUNTER — Other Ambulatory Visit: Payer: Self-pay

## 2022-09-14 ENCOUNTER — Telehealth: Payer: Self-pay | Admitting: Internal Medicine

## 2022-09-14 ENCOUNTER — Other Ambulatory Visit
Admission: RE | Admit: 2022-09-14 | Discharge: 2022-09-14 | Disposition: A | Discharge status: Home / Self Care | Payer: Medicare PPO | Attending: Internal Medicine | Admitting: Internal Medicine

## 2022-09-14 DIAGNOSIS — E785 Hyperlipidemia, unspecified: Secondary | ICD-10-CM | POA: Insufficient documentation

## 2022-09-14 DIAGNOSIS — I480 Paroxysmal atrial fibrillation: Secondary | ICD-10-CM

## 2022-09-14 LAB — LIPID PANEL
Cholesterol: 165 mg/dL (ref 0–200)
HDL: 92 mg/dL (ref >40)
LDL Cholesterol: 52 mg/dL (ref 0–99)
Total CHOL/HDL Ratio: 1.8 RATIO
Triglycerides: 103 mg/dL (ref <150)
VLDL: 21 mg/dL (ref 0–40)

## 2022-09-14 LAB — CBC
HCT: 40.7 % (ref 36.0–46.0)
Hemoglobin: 13.4 g/dL (ref 12.0–15.0)
MCH: 30.6 pg (ref 26.0–34.0)
MCHC: 32.9 g/dL (ref 30.0–36.0)
MCV: 92.9 fL (ref 80.0–100.0)
Platelets: 251 10*3/uL (ref 150–400)
RBC: 4.38 MIL/uL (ref 3.87–5.11)
RDW: 13.5 % (ref 11.5–15.5)
WBC: 7.2 10*3/uL (ref 4.0–10.5)
nRBC: 0 % (ref 0.0–0.2)

## 2022-09-14 LAB — HEPATIC FUNCTION PANEL
ALT: 15 U/L (ref 0–44)
AST: 21 U/L (ref 15–41)
Albumin: 4 g/dL (ref 3.5–5.0)
Alkaline Phosphatase: 59 U/L (ref 38–126)
Bilirubin, Direct: 0.1 mg/dL (ref 0.0–0.2)
Indirect Bilirubin: 0.5 mg/dL (ref 0.3–0.9)
Total Bilirubin: 0.6 mg/dL (ref 0.3–1.2)
Total Protein: 7.2 g/dL (ref 6.5–8.1)

## 2022-09-14 LAB — BASIC METABOLIC PANEL
Anion gap: 11 (ref 5–15)
BUN: 20 mg/dL (ref 8–23)
CO2: 24 mmol/L (ref 22–32)
Calcium: 9.4 mg/dL (ref 8.9–10.3)
Chloride: 100 mmol/L (ref 98–111)
Creatinine, Ser: 0.87 mg/dL (ref 0.44–1.00)
GFR, Estimated: >60 mL/min (ref >60)
Glucose, Bld: 100 mg/dL — ABNORMAL HIGH (ref 70–99)
Potassium: 4.1 mmol/L (ref 3.5–5.1)
Sodium: 135 mmol/L (ref 135–145)

## 2022-09-14 NOTE — Telephone Encounter (Signed)
Patient with diagnosis of afib on Eliquis for anticoagulation.    Procedure: removing abscessed #30, deep recurrent decay #31 with bone grafting   Date of procedure: 09/20/22  CHA2DS2-VASc Score = 5  This indicates a 7.2% annual risk of stroke. The patient's score is based upon: CHF History: 1 HTN History: 1 Diabetes History: 0 Stroke History: 0 Vascular Disease History: 0 Age Score: 2 Gender Score: 1   Upcoming AV node ablation on 09/26/22. Per Dr Caryl Comes, [place] hold would not affect this.  CrCl 62mL/min Platelet count 222K  Patient does not require pre-op antibiotics for dental procedure.  Per office protocol, patient can hold Eliquis for 1 day prior to procedure.    **This guidance is not considered finalized until pre-operative APP has relayed final recommendations.**

## 2022-09-14 NOTE — Telephone Encounter (Signed)
The patient has been notified of the result and verbalized understanding.  All questions (if any) were answered. Gershon Crane, LPN 1/53/7943 2:76 AM

## 2022-09-14 NOTE — Telephone Encounter (Signed)
   Primary Cardiologist: Nelva Bush, MD  Chart reviewed as part of pre-operative protocol coverage. Simple dental extractions are considered low risk procedures per guidelines and generally do not require any specific cardiac clearance. It is also generally accepted that for simple extractions and dental cleanings, there is no need to interrupt blood thinner therapy.   SBE prophylaxis is not required for the patient.  Per office protocol, patient may hold Eliquis for 1 day prior to dental surgery if indicated by Dr. Lewanda Rife.   I will route this recommendation to the requesting party via Epic fax function and remove from pre-op pool.  Please call with questions.  Emmaline Life, NP-C  09/14/2022, 8:08 AM [address] Office [phone] Fax [phone]

## 2022-09-14 NOTE — Telephone Encounter (Signed)
[place] with [facility] states the patient is there to get lab work, but they do not have any orders.

## 2022-09-14 NOTE — Telephone Encounter (Signed)
No number left for the lab... according to her chart labs were drawn this morning after the cll was taken by our office.. will close this encounter.

## 2022-09-15 NOTE — Progress Notes (Signed)
All labs are stable. No changes needed to current medication regimen.

## 2022-09-19 ENCOUNTER — Other Ambulatory Visit: Payer: Self-pay | Admitting: Internal Medicine

## 2022-09-23 NOTE — Pre-Procedure Instructions (Signed)
Instructed patient on the following items: Arrival time 0730 Nothing to eat or drink after midnight No meds AM of procedure Responsible person to drive you home and stay with you for 24 hrs

## 2022-09-26 ENCOUNTER — Telehealth: Payer: Self-pay | Admitting: Internal Medicine

## 2022-09-26 ENCOUNTER — Ambulatory Visit (HOSPITAL_COMMUNITY)
Admission: RE | Admit: 2022-09-26 | Discharge: 2022-09-26 | Disposition: A | Discharge status: Home / Self Care | Payer: Medicare PPO | Attending: Internal Medicine | Admitting: Internal Medicine

## 2022-09-26 ENCOUNTER — Encounter (HOSPITAL_COMMUNITY)
Admission: RE | Disposition: A | Discharge status: Home / Self Care | Payer: Self-pay | Source: Home / Self Care | Attending: Internal Medicine

## 2022-09-26 DIAGNOSIS — I493 Ventricular premature depolarization: Secondary | ICD-10-CM | POA: Insufficient documentation

## 2022-09-26 DIAGNOSIS — R000 Tachycardia, unspecified: Secondary | ICD-10-CM | POA: Diagnosis present

## 2022-09-26 DIAGNOSIS — I4892 Unspecified atrial flutter: Secondary | ICD-10-CM | POA: Insufficient documentation

## 2022-09-26 DIAGNOSIS — I110 Hypertensive heart disease with heart failure: Secondary | ICD-10-CM | POA: Diagnosis not present

## 2022-09-26 DIAGNOSIS — I5032 Chronic diastolic (congestive) heart failure: Secondary | ICD-10-CM | POA: Insufficient documentation

## 2022-09-26 DIAGNOSIS — D649 Anemia, unspecified: Secondary | ICD-10-CM | POA: Insufficient documentation

## 2022-09-26 DIAGNOSIS — I480 Paroxysmal atrial fibrillation: Secondary | ICD-10-CM | POA: Insufficient documentation

## 2022-09-26 DIAGNOSIS — R5383 Other fatigue: Secondary | ICD-10-CM | POA: Diagnosis not present

## 2022-09-26 DIAGNOSIS — Z950 Presence of cardiac pacemaker: Secondary | ICD-10-CM | POA: Insufficient documentation

## 2022-09-26 HISTORY — PX: AV NODE ABLATION: EP1193

## 2022-09-26 SURGERY — AV NODE ABLATION

## 2022-09-26 MED ORDER — FENTANYL CITRATE (PF) 100 MCG/2ML IJ SOLN
INTRAMUSCULAR | Status: DC | PRN
  Administered 2022-09-26: 12.5 ug via INTRAVENOUS

## 2022-09-26 MED ORDER — SODIUM CHLORIDE 0.9% FLUSH: 3.0000 mL | INTRAVENOUS | Status: DC | PRN

## 2022-09-26 MED ORDER — ACETAMINOPHEN 325 MG PO TABS: 650.0000 mg | ORAL_TABLET | ORAL | Status: DC | PRN

## 2022-09-26 MED ORDER — MIDAZOLAM HCL 5 MG/5ML IJ SOLN
INTRAMUSCULAR | Status: AC
  Filled 2022-09-26: qty 5

## 2022-09-26 MED ORDER — SODIUM CHLORIDE 0.9 % IV SOLN: INTRAVENOUS | Status: DC

## 2022-09-26 MED ORDER — SODIUM CHLORIDE 0.9 % IV SOLN: 250.0000 mL | INTRAVENOUS | Status: DC | PRN

## 2022-09-26 MED ORDER — ONDANSETRON HCL 4 MG/2ML IJ SOLN: 4.0000 mg | Freq: Four times a day (QID) | INTRAMUSCULAR | Status: DC | PRN

## 2022-09-26 MED ORDER — BUPIVACAINE HCL (PF) 0.25 % IJ SOLN
INTRAMUSCULAR | Status: AC
  Filled 2022-09-26: qty 30

## 2022-09-26 MED ORDER — MIDAZOLAM HCL 5 MG/5ML IJ SOLN
INTRAMUSCULAR | Status: DC | PRN
  Administered 2022-09-26: 1 mg via INTRAVENOUS

## 2022-09-26 MED ORDER — BUPIVACAINE HCL (PF) 0.25 % IJ SOLN
INTRAMUSCULAR | Status: DC | PRN
  Administered 2022-09-26: 20 mL

## 2022-09-26 MED ORDER — FENTANYL CITRATE (PF) 100 MCG/2ML IJ SOLN
INTRAMUSCULAR | Status: AC
  Filled 2022-09-26: qty 2

## 2022-09-26 MED ORDER — SODIUM CHLORIDE 0.9% FLUSH: 3.0000 mL | Freq: Two times a day (BID) | INTRAVENOUS | Status: DC

## 2022-09-26 MED ORDER — HEPARIN (PORCINE) IN NACL 1000-0.9 UT/500ML-% IV SOLN
INTRAVENOUS | Status: DC | PRN
  Administered 2022-09-26: 500 mL

## 2022-09-26 SURGICAL SUPPLY — 6 items
BAG SNAP BAND KOVER 36X36 (MISCELLANEOUS) IMPLANT
CATH CELSIUS THERM D CV 7F (ABLATOR) IMPLANT
PACK EP LATEX FREE (CUSTOM PROCEDURE TRAY) ×1
PACK EP LF (CUSTOM PROCEDURE TRAY) ×1 IMPLANT
PAD DEFIB RADIO PHYSIO CONN (PAD) ×1 IMPLANT
SHEATH PINNACLE 8F 10CM (SHEATH) IMPLANT

## 2022-09-26 NOTE — Progress Notes (Signed)
Per patients niece, after the procedure she will need a catheter because she cannot use the purewick or bedpan and she gets very anxious when she cannot void.

## 2022-09-26 NOTE — Progress Notes (Signed)
77f sheath aspirated and removed from right femoral vein. Manual pressure applied for 15 minutes. Site level 0 , no s+s of hematoma. Tegaderm dressing applied, bedrest instructions given.    Bilateral dp pulses palpable.     Bedrest begins at 11:30:00

## 2022-09-26 NOTE — Progress Notes (Addendum)
Pt states she is unable to use a bed pan not the pure wick either. Received an order for a foley. I attempted a 14g foley without success. Page Rosana Hoes RN was also unable to place it. Pt currently on bed pan. Per Page RN Dr Lovena Le stated if pt can not urinate use bladder scan and if greater than 300 ml she can get up to bedside commode. Risks of bleeding have been explained to pt and family.  Pt stating she had abdominal pain due to needing to void, unable to get accurate bladder scan because we couldn't press down enough to record. Pt was gotten up bedside and voided approx 300 ml clear pale yellow urine, no bleeding at groin site, pt now resting comfortably.

## 2022-09-26 NOTE — Discharge Instructions (Signed)

## 2022-09-26 NOTE — Progress Notes (Signed)
Pt states she is unable to use a bed pan not the pure wick either. Received an order for a foley. I attempted a 14g foley without success. Page Rosana Hoes RN was also unable to place it. Pt currently on bed pan. Per Page RN Dr Lovena Le stated if pt can not urinate use bladder scan and if greater than 300 ml she can get up to bedside commode. Risks of bleeding have been explained to pt and family.  Pt stating she had abdominal pain due to needing to void, unable to get accurate bladder scan because we couldn't press down enough to record. Pt was gotten up bedside and voided approx 300 ml clear pale yellow urine, no bleeding at groin site, pt now resting comfortably.  1420 pt up again to bedside commode w/o incident. Clear pale yellow urine approx 300 cc

## 2022-09-26 NOTE — H&P (Signed)
Patient Care Team: Kirk Ruths, MD as PCP - General (Internal Medicine) End, Harrell Gave, MD as PCP - Cardiology (Cardiology) Bary Castilla, Forest Gleason, MD (General Surgery) Deeann Cree, MD (Unknown Physician Specialty)     HPI   Meagan Wilcox is a 89 y.o. female seen in follow-up for atrial fibrillation/flutter intermittent bradycardia, rhythm control and rate control strategies having been largely of failure secondary to progressive bradycardia, concerns about aggravating bradycardia.  (She comes in with her niece Meagan Wilcox) she even failed ISA beta-blockers.  With issues of tacky bradycardia and symptomatic bradycardia particularly, discussions were had regarding antiarrhythmic therapy probably limited dofetilide given her age and the bradycardia and backup bradycardia pacing with a rate control strategy.  She elected the latter and underwent pacing Abbott  for rate controlling strategy 6/23   Not tolerated amiodarone and or flecainide.   Recurrent emergency room 11/23 with rapid atrial fibrillation>> HR 146>>DCCV associated with lightheadedness and tachypalpitations.  Occurred later the day following being informed of the death of her sister     Mean heart rate in sinus was 67 with a minimum of 49.     Following pacemaker implantation has had no more of the very severe weakness spells that are largely immobilizing, she does continue to complain of fatigue.  Ambulation is limited by leg pain also.     DATE TEST EF    9/20 Echo   55-60 % MR mild-mod  6/22 Echo   55-60 % DDysfunction Gd 2             Date Cr K Hgb  1/23 1.13 4.5  13.2   5/23 0.97 3.8 11.2  11/23 0.8 3.8 [id number]    Thromboembolic risk factors ( age -24, HTN-1, Gender-1) for a CHADSVASc Score of >=4       Records and Results Reviewed        Past Medical History:  Diagnosis Date   (HFpEF) heart failure with preserved ejection fraction ([place])      a. 01/2021 Echo: EF 55-60%, no rwma, GrII  DD, nl RV fxn, mod elev PASP. Mild MR, mild-mod TR.   Abnormal mammogram, unspecified 2011   Arthritis 2002   Chronic pain syndrome 08/01/2017   GERD (gastroesophageal reflux disease)     Heart murmur 1985   Hyperlipidemia     Hypertension 2005   Hypothyroidism     Persistent atrial fibrillation/Atypical atrial flutter ([place])      a. 04/2019 TEE/DCCV; b. 05/2019 Recurrent Aflutter-->Amio-->DCCV; c. CHA2DS2VASc = 4-->Eliquis; d. 08/2021 recurrent Afib req DCCV.  Flecainide started; e. 09/2021 Flecainide d/c'd 2/2 brady - couldn't tol bb.   PSVT (paroxysmal supraventricular tachycardia)      a. 09/2021 Zio: Predominantly sinus rhythm (67; range 49-102).  Rare PACs with occasional PVCs (PVC burden 3.6%).  44 SVT runs-fastest 190, longest 12.9 seconds.  No sustained arrhythmias/prolonged pauses.  No significant arrhythmia identified corresponding to patient's symptoms.           Past Surgical History:  Procedure Laterality Date   APPENDECTOMY   1939   BREAST BIOPSY Left 02-24-14    benign   BREAST BIOPSY Left 02/11/2016    neg   BREAST BIOPSY Left 2013    neg   BREAST EXCISIONAL BIOPSY Left 2014    neg   BREAST SURGERY Left 2013    stereotactic biopsy    BREAST SURGERY Left May 2010    1 mm foci  of atypical ductal hyperplasia   CARDIOVERSION N/A 05/08/2019    Procedure: CARDIOVERSION;  Surgeon: Kate Sable, MD;  Location: [facility];  Service: Cardiovascular;  Laterality: N/A;   CARDIOVERSION N/A 05/31/2019    Procedure: CARDIOVERSION;  Surgeon: Minna Merritts, MD;  Location: [facility];  Service: Cardiovascular;  Laterality: N/A;   CARDIOVERSION N/A 08/25/2021    Procedure: CARDIOVERSION;  Surgeon: Kate Sable, MD;  Location: [facility];  Service: Cardiovascular;  Laterality: N/A;   CARDIOVERSION N/A 12/29/2021    Procedure: CARDIOVERSION;  Surgeon: Kate Sable, MD;  Location: [facility];  Service: Cardiovascular;  Laterality: N/A;   cataract surgery  Bilateral 2009    COLONOSCOPY   2010    Dr. Vira Agar   left breast biopsy    2009   PACEMAKER IMPLANT N/A 01/26/2022    Procedure: PACEMAKER IMPLANT;  Surgeon: Deboraha Sprang, MD;  Location: [facility];  Service: Cardiovascular;  Laterality: N/A;   TEE WITHOUT CARDIOVERSION N/A 05/08/2019    Procedure: TRANSESOPHAGEAL ECHOCARDIOGRAM (TEE);  Surgeon: Kate Sable, MD;  Location: [facility];  Service: Cardiovascular;  Laterality: N/A;      Active Medications      Current Meds  Medication Sig   acetaminophen (TYLENOL) 325 MG tablet Take 325 mg by mouth every 6 (six) hours as needed for mild pain, fever, headache or moderate pain.   amitriptyline (ELAVIL) 25 MG tablet TAKE 1 TABLET BY MOUTH DAILY   apixaban (ELIQUIS) 5 MG TABS tablet TAKE 1 TABLET(5 MG) BY MOUTH TWICE DAILY   BIOTIN PO Take 1 tablet by mouth daily.   Camphor-Menthol-Methyl Sal (SALONPAS) 3.08-20-08 % PTCH Place 1 patch onto the skin daily as needed (back pain).   diltiazem (CARDIZEM CD) 180 MG 24 hr capsule Take 1 capsule (180 mg total) by mouth daily.   furosemide (LASIX) 20 MG tablet TAKE 1 TABLET(20 MG) BY MOUTH DAILY   levothyroxine (SYNTHROID) 125 MCG tablet TAKE 1 TABLET(125 MCG) BY MOUTH DAILY   LORazepam (ATIVAN) 0.5 MG tablet Take 1 tablet by mouth every 12 (twelve) hours as needed for anxiety.   losartan (COZAAR) 50 MG tablet Take 1 tablet (50 mg total) by mouth daily.   melatonin 5 MG TABS Take 1 tablet (5 mg total) by mouth at bedtime as needed. Home med.   Multiple Vitamin (MULTIVITAMIN) capsule Take 1 capsule by mouth daily.   omeprazole (PRILOSEC) 20 MG capsule TAKE 1 CAPSULE BY MOUTH DAILY   pentosan polysulfate (ELMIRON) 100 MG capsule Take 100 mg by mouth 2 (two) times daily.   psyllium (METAMUCIL SMOOTH TEXTURE) 28 % packet Take 1 packet by mouth daily at 8 pm.   rosuvastatin (CRESTOR) 5 MG tablet Take 1 tablet (5 mg total) by mouth daily.   verapamil (CALAN) 40 MG tablet Take 1 tablet (40 mg) by mouth every 4 hours  up to 3 doses as needed for recurrent tachycardia             Allergies  Allergen Reactions   Erythromycin Other (See Comments)   Other Other (See Comments)   Amoxicillin Diarrhea   Penicillins Rash      Did it involve swelling of the face/tongue/throat, SOB, or low BP? No Did it involve sudden or severe rash/hives, skin peeling, or any reaction on the inside of your mouth or nose? No Did you need to seek medical attention at a hospital or doctor's office? Yes When did it last happen?      65 years ago  If all above answers are "NO", may proceed with cephalosporin use.          Review of Systems negative except from HPI and PMH   Physical Exam BP (!) 130/52 (BP Location: Left Arm, Patient Position: Sitting, Cuff Size: Normal)   Pulse 65   Ht 5' 3"$  (1.6 m)   Wt 153 lb (69.4 kg)   SpO2 96%   BMI 27.10 kg/m  Well developed and well nourished in no acute distress HENT normal Neck supple with JVP-flat Clear Device pocket well healed; without hematoma or erythema.  There is no tethering  Regular rate and rhythm, no  gallop No / murmur Abd-soft with active BS No Clubbing cyanosis  edema Skin-warm and dry A & Oriented  Grossly normal sensory and motor function   ECG Apacing @ 65   Device function not interrgoated     ECG atrial paced at 65 Intervals 18/10/37 Voltage criteria for LVH   Estimated Creatinine Clearance: 44.6 mL/min (by C-G formula based on SCr of 0.8 mg/dL).     Assessment and  Plan   Atrial flutter atypical 2:1 conduction/ Atrial fib    Pacemaker-[employer] Jude DOI 6/23 (standard RV apical lead position)   HFpEF   Anemia   Bradycardia   HTN   PVCs   Fatigue   Recurrent atrial tachyarrhythmias palpitations and symptoms.  Discussed alternative strategies, ongoing efforts at rate control including AV junction ablation.  This would obviate the need for rate controlling drugs.  We discussed the downsides including procedural risks of death and  vascular injury and a small possibility of the development of pacemaker cardiomyopathy given RV apical pacing location   She will review it further with her niece and then let us know how she would like to proceed.   Current medicines are reviewed at length with the patient today .  The patient does not  have concerns regarding medicines.    EP Attending  Patient seen and examined. Agree with the findings as noted above. The patient presents for AV node ablation due to uncontrolled atrial fib with a  RVR. I have reviewed the indications/risks/benefits/goals/expectations of the procedure with the patient and she wishes to proceed.  Carleene Overlie Arelis Neumeier,MD

## 2022-09-26 NOTE — Telephone Encounter (Signed)
*  STAT* If patient is at the pharmacy, call can be transferred to refill team.   1. Which medications need to be refilled? (please list name of each medication and dose if known) diltiazem (CARDIZEM CD) 180 MG 24 hr capsule   2. Which pharmacy/location (including street and city if local pharmacy) is medication to be sent to? [facility] [place], [address] AT [facility] OF [address]   3. Do they need a 30 day or 90 day supply? 30 day  Patient is out of medication.

## 2022-09-27 ENCOUNTER — Encounter (HOSPITAL_COMMUNITY): Payer: Self-pay | Admitting: Internal Medicine

## 2022-09-27 MED ORDER — DILTIAZEM HCL ER COATED BEADS 180 MG PO CP24: 180.0000 mg | ORAL_CAPSULE | Freq: Every day | ORAL | 2 refills | Status: DC

## 2022-10-01 ENCOUNTER — Other Ambulatory Visit: Payer: Self-pay | Admitting: Cardiology

## 2022-10-03 ENCOUNTER — Other Ambulatory Visit: Admission: RE | Admit: 2022-10-03 | Payer: Medicare PPO | Source: Ambulatory Visit | Admitting: Internal Medicine

## 2022-10-05 NOTE — Progress Notes (Unsigned)
Follow-up Outpatient Visit Date: 10/06/2022  Primary Care Provider: Kirk Ruths, MD [place] [id number]  Chief Complaint: Follow-up atrial fibrillation s/p AV node ablation  HPI:  Meagan Wilcox is a 89 y.o. female with history of paroxysmal atrial fibrillation/flutter complicated by bradycardia status post dual-chamber pacemaker (01/2022), chronic HFpEF, hypertension, hyperlipidemia, hypothyroidism, GERD, and arthritis, who presents for follow-up of atrial flutter and HFpEF.  I last saw her in October, at which time she reported some fatigue, which did not improve after discontinuation of diltiazem.  We agreed to restart diltiazem and transition losartan/HCTZ to losartan alone, as she was also on furosemide for volume management in the setting of HFpEF.  She was seen in the ED twice in 06/2022 with atrial fibrillation with rapid ventricular response.  She saw Dr. Caryl Comes in follow-up in late November, who recommended AV node ablation.  This was performed by Dr. Lovena Le last week.  Today, Meagan Wilcox reports that she is feeling a little better with less fatigue and no further palpitations following her AV node ablation last week.  She has not had any chest pain or lightheadedness.  Exertional dyspnea with strenuous activities remains unchanged.  Mild leg edema is still present but well-controlled with compression stockings.  She remains compliant with her medications and inquires about the need to continue taking diltiazem now that her AV node has been ablated.  --------------------------------------------------------------------------------------------------  Past Medical History:  Diagnosis Date   (HFpEF) heart failure with preserved ejection fraction ([place])    a. 01/2021 Echo: EF 55-60%, no rwma, GrII DD, nl RV fxn, mod elev PASP. Mild MR, mild-mod TR.   Abnormal mammogram, unspecified 2011   Arthritis 2002   Chronic pain syndrome 08/01/2017    GERD (gastroesophageal reflux disease)    Heart murmur 1985   Hyperlipidemia    Hypertension 2005   Hypothyroidism    Persistent atrial fibrillation/Atypical atrial flutter ([place])    a. 04/2019 TEE/DCCV; b. 05/2019 Recurrent Aflutter-->Amio-->DCCV; c. CHA2DS2VASc = 4-->Eliquis; d. 08/2021 recurrent Afib req DCCV.  Flecainide started; e. 09/2021 Flecainide d/c'd 2/2 brady - couldn't tol bb.   PSVT (paroxysmal supraventricular tachycardia)    a. 09/2021 Zio: Predominantly sinus rhythm (67; range 49-102).  Rare PACs with occasional PVCs (PVC burden 3.6%).  44 SVT runs-fastest 190, longest 12.9 seconds.  No sustained arrhythmias/prolonged pauses.  No significant arrhythmia identified corresponding to patient's symptoms.   Past Surgical History:  Procedure Laterality Date   APPENDECTOMY  1939   AV NODE ABLATION N/A 09/26/2022   Procedure: AV NODE ABLATION;  Surgeon: Evans Lance, MD;  Location: [facility];  Service: Cardiovascular;  Laterality: N/A;   BREAST BIOPSY Left 02-24-14   benign   BREAST BIOPSY Left 02/11/2016   neg   BREAST BIOPSY Left 2013   neg   BREAST EXCISIONAL BIOPSY Left 2014   neg   BREAST SURGERY Left 2013   stereotactic biopsy    BREAST SURGERY Left May 2010   1 mm foci of atypical ductal hyperplasia   CARDIOVERSION N/A 05/08/2019   Procedure: CARDIOVERSION;  Surgeon: Kate Sable, MD;  Location: [facility];  Service: Cardiovascular;  Laterality: N/A;   CARDIOVERSION N/A 05/31/2019   Procedure: CARDIOVERSION;  Surgeon: Minna Merritts, MD;  Location: [facility];  Service: Cardiovascular;  Laterality: N/A;   CARDIOVERSION N/A 08/25/2021   Procedure: CARDIOVERSION;  Surgeon: Kate Sable, MD;  Location: [facility];  Service: Cardiovascular;  Laterality: N/A;  CARDIOVERSION N/A 12/29/2021   Procedure: CARDIOVERSION;  Surgeon: Kate Sable, MD;  Location: [facility];  Service: Cardiovascular;  Laterality: N/A;   cataract surgery  Bilateral 2009    COLONOSCOPY  2010   Dr. Vira Agar   left breast biopsy   2009   PACEMAKER IMPLANT N/A 01/26/2022   Procedure: PACEMAKER IMPLANT;  Surgeon: Deboraha Sprang, MD;  Location: [facility];  Service: Cardiovascular;  Laterality: N/A;   TEE WITHOUT CARDIOVERSION N/A 05/08/2019   Procedure: TRANSESOPHAGEAL ECHOCARDIOGRAM (TEE);  Surgeon: Kate Sable, MD;  Location: [facility];  Service: Cardiovascular;  Laterality: N/A;    Current Meds  Medication Sig   acetaminophen (TYLENOL) 500 MG tablet Take 500 mg by mouth every 6 (six) hours as needed (pain).   amitriptyline (ELAVIL) 25 MG tablet TAKE 1 TABLET BY MOUTH DAILY   apixaban (ELIQUIS) 5 MG TABS tablet TAKE 1 TABLET(5 MG) BY MOUTH TWICE DAILY   BIOTIN PO Take 1 tablet by mouth daily.   Camphor-Menthol-Methyl Sal (SALONPAS) 3.08-20-08 % PTCH Place 1 patch onto the skin daily as needed (pain).   diltiazem (CARDIZEM CD) 180 MG 24 hr capsule Take 1 capsule (180 mg total) by mouth daily.   furosemide (LASIX) 20 MG tablet TAKE 1 TABLET(20 MG) BY MOUTH DAILY   levothyroxine (SYNTHROID) 125 MCG tablet TAKE 1 TABLET(125 MCG) BY MOUTH DAILY   LORazepam (ATIVAN) 0.5 MG tablet Take 0.5 mg by mouth daily as needed for anxiety.   losartan (COZAAR) 50 MG tablet Take 1 tablet (50 mg total) by mouth daily.   melatonin 5 MG TABS Take 1 tablet (5 mg total) by mouth at bedtime as needed. Home med.   Multiple Vitamin (MULTIVITAMIN) capsule Take 1 capsule by mouth daily.   naproxen sodium (ALEVE) 220 MG tablet Take 220 mg by mouth daily as needed (pain.).   omeprazole (PRILOSEC) 20 MG capsule TAKE 1 CAPSULE BY MOUTH DAILY   pentosan polysulfate (ELMIRON) 100 MG capsule Take 100 mg by mouth 2 (two) times daily.   psyllium (METAMUCIL SMOOTH TEXTURE) 28 % packet Take 1 packet by mouth daily at 12 noon.   rosuvastatin (CRESTOR) 5 MG tablet TAKE 1 TABLET(5 MG) BY MOUTH DAILY   verapamil (CALAN) 40 MG tablet Take 1 tablet (40 mg) by mouth every 4 hours up to 3 doses as  needed for recurrent tachycardia    Allergies: Erythromycin, Amoxicillin, and Penicillins  Social History   Tobacco Use   Smoking status: Never   Smokeless tobacco: Never  Vaping Use   Vaping Use: Never used  Substance Use Topics   Alcohol use: No    Alcohol/week: 0.0 standard drinks of alcohol   Drug use: No    Family History  Problem Relation Age of Onset   Heart disease Mother    Heart disease Father    Diabetes Father    Stroke Father    Arthritis Father     Review of Systems: A 12-system review of systems was performed and was negative except as noted in the HPI.  --------------------------------------------------------------------------------------------------  Physical Exam: BP 138/60 (BP Location: Right Arm, Patient Position: Sitting, Cuff Size: Normal)   Pulse 62   Ht 5' 3"$  (1.6 m)   Wt 152 lb (68.9 kg)   SpO2 98%   BMI 26.93 kg/m   General:  NAD.  Accompanied by her niece. Neck: No JVD or HJR. Lungs: Clear to auscultation bilaterally without wheezes or crackles. Heart: Regular rate and rhythm without murmurs, rubs, or  gallops. Abdomen: Soft, nontender, nondistended. Extremities: No lower extremity edema with compression stockings in place.  Right femoral venotomy well-healed without hematoma.  EKG: AV paced rhythm.  Lab Results  Component Value Date   WBC 7.2 09/14/2022   HGB 13.4 09/14/2022   HCT 40.7 09/14/2022   MCV 92.9 09/14/2022   PLT 251 09/14/2022    Lab Results  Component Value Date   NA 135 09/14/2022   K 4.1 09/14/2022   CL 100 09/14/2022   CO2 24 09/14/2022   BUN 20 09/14/2022   CREATININE 0.87 09/14/2022   GLUCOSE 100 (H) 09/14/2022   ALT 15 09/14/2022    Lab Results  Component Value Date   CHOL 165 09/14/2022   HDL 92 09/14/2022   LDLCALC 52 09/14/2022   TRIG 103 09/14/2022   CHOLHDL 1.8 09/14/2022    --------------------------------------------------------------------------------------------------  ASSESSMENT  AND PLAN: Persistent atrial fibrillation and tachy-brady syndrome: Meagan Wilcox is feeling well following her AV node ablation last week.  She will need to remain on indefinite anticoagulation with apixaban.  Given that she should not have any further episodes of rapid ventricular response, we will discontinue diltiazem today.  Hopefully, that will help with some of her chronic fatigue as well.  She should follow-up as previously arranged with Dr. Lovena Le next month.  Ongoing device follow-up per Dr. Caryl Comes and the device clinic.  Chronic HFpEF: Meagan Wilcox appears euvolemic on exam today with stable NYHA class II symptoms.  We will continue current dose of furosemide.  Hypertension: Blood pressure borderline elevated today.  As we will be stopping diltiazem, I have asked Meagan Wilcox to monitor her blood pressure at home and to alert [place] if it is consistently above 140/90 so that we can consider increasing losartan.  Follow-up: Return to clinic in 4 months.  Follow-up with Dr. Lovena Le at [facility] as scheduled on 10/27/2022.  Nelva Bush, MD 10/06/2022 10:14 AM

## 2022-10-06 ENCOUNTER — Encounter: Payer: Self-pay | Admitting: Internal Medicine

## 2022-10-06 ENCOUNTER — Ambulatory Visit: Payer: Medicare PPO | Attending: Internal Medicine | Admitting: Internal Medicine

## 2022-10-06 VITALS — BP 138/60 | HR 62 | Ht 63.0 in | Wt 152.0 lb

## 2022-10-06 DIAGNOSIS — I4819 Other persistent atrial fibrillation: Secondary | ICD-10-CM

## 2022-10-06 DIAGNOSIS — I495 Sick sinus syndrome: Secondary | ICD-10-CM | POA: Diagnosis not present

## 2022-10-06 DIAGNOSIS — I10 Essential (primary) hypertension: Secondary | ICD-10-CM | POA: Diagnosis not present

## 2022-10-06 DIAGNOSIS — I5032 Chronic diastolic (congestive) heart failure: Secondary | ICD-10-CM

## 2022-10-06 NOTE — Patient Instructions (Addendum)
Medication Instructions:  Your physician recommends the following medication changes.  STOP TAKING: Diltiazem 180 mg daily  *If you need a refill on your cardiac medications before your next appointment, please call your pharmacy*   Lab Work: None ordered today   Testing/Procedures: None ordered today   Follow-Up: At [facility], you and your health needs are our priority.  As part of our continuing mission to provide you with exceptional heart care, we have created designated Provider Care Teams.  These Care Teams include your primary Cardiologist (physician) and Advanced Practice Providers (APPs -  Physician Assistants and Nurse Practitioners) who all work together to provide you with the care you need, when you need it.  We recommend signing up for the patient portal called "MyChart".  Sign up information is provided on this After Visit Summary.  MyChart is used to connect with patients for Virtual Visits (Telemedicine).  Patients are able to view lab/test results, encounter notes, upcoming appointments, etc.  Non-urgent messages can be sent to your provider as well.   To learn more about what you can do with MyChart, go to [email or web address].    Your next appointment:   4 month(s)  Provider:   You may see Nelva Bush, MD or one of the following Advanced Practice Providers on your designated Care Team:   Murray Hodgkins, NP Christell Faith, PA-C Cadence Kathlen Mody, PA-C Gerrie Nordmann, NP    Dr. Saunders Revel recommends checking and keeping a log of your blood pressure. Please call the office if you notice your blood pressure is consistently greater than 140/90.  It is best to check your blood pressure 1-2 hours after taking your medications.  Below are some tips that our clinical pharmacists share for home BP monitoring:          Rest 10 minutes before taking your blood pressure.          Don't smoke or drink caffeinated beverages for at least 30 minutes before.           Take your blood pressure before (not after) you eat.          Sit comfortably with your back supported and both feet on the floor (don't cross your legs).          Elevate your arm to heart level on a table or a desk.          Use the proper sized cuff. It should fit smoothly and snugly around your bare upper arm. There should be enough room to slip a fingertip under the cuff. The bottom edge of the cuff should be 1 inch above the crease of the elbow.

## 2022-10-26 ENCOUNTER — Ambulatory Visit (INDEPENDENT_AMBULATORY_CARE_PROVIDER_SITE_OTHER): Payer: Medicare PPO

## 2022-10-26 DIAGNOSIS — I495 Sick sinus syndrome: Secondary | ICD-10-CM

## 2022-10-27 ENCOUNTER — Ambulatory Visit: Payer: Medicare PPO | Attending: Internal Medicine | Admitting: Internal Medicine

## 2022-10-27 ENCOUNTER — Encounter: Payer: Self-pay | Admitting: Internal Medicine

## 2022-10-27 VITALS — BP 144/66 | HR 65 | Ht 63.0 in | Wt 150.4 lb

## 2022-10-27 DIAGNOSIS — I4892 Unspecified atrial flutter: Secondary | ICD-10-CM | POA: Diagnosis not present

## 2022-10-27 DIAGNOSIS — I493 Ventricular premature depolarization: Secondary | ICD-10-CM | POA: Diagnosis not present

## 2022-10-27 DIAGNOSIS — Z950 Presence of cardiac pacemaker: Secondary | ICD-10-CM

## 2022-10-27 DIAGNOSIS — R001 Bradycardia, unspecified: Secondary | ICD-10-CM | POA: Diagnosis not present

## 2022-10-27 LAB — CUP PACEART INCLINIC DEVICE CHECK
Battery Remaining Longevity: 98 mo
Battery Voltage: 3.02 V
Brady Statistic RA Percent Paced: 53 %
Brady Statistic RV Percent Paced: 98 %
Date Time Interrogation Session: 20240314171706
Implantable Lead Connection Status: 753985
Implantable Lead Connection Status: 753985
Implantable Lead Implant Date: 20230614
Implantable Lead Implant Date: 20230614
Implantable Lead Location: 753859
Implantable Lead Location: 753860
Implantable Lead Model: 1944
Implantable Lead Model: 1948
Implantable Pulse Generator Implant Date: 20230614
Lead Channel Impedance Value: 525 Ohm
Lead Channel Impedance Value: 662.5 Ohm
Lead Channel Pacing Threshold Amplitude: 0.5 V
Lead Channel Pacing Threshold Amplitude: 0.5 V
Lead Channel Pacing Threshold Amplitude: 0.75 V
Lead Channel Pacing Threshold Amplitude: 0.75 V
Lead Channel Pacing Threshold Pulse Width: 0.5 ms
Lead Channel Pacing Threshold Pulse Width: 0.5 ms
Lead Channel Pacing Threshold Pulse Width: 0.5 ms
Lead Channel Pacing Threshold Pulse Width: 0.5 ms
Lead Channel Sensing Intrinsic Amplitude: 4.7 mV
Lead Channel Sensing Intrinsic Amplitude: 8.5 mV
Lead Channel Setting Pacing Amplitude: 2 V
Lead Channel Setting Pacing Amplitude: 2.5 V
Lead Channel Setting Pacing Pulse Width: 0.5 ms
Lead Channel Setting Sensing Sensitivity: 4 mV
Pulse Gen Model: 2272
Pulse Gen Serial Number: 8087558

## 2022-10-27 MED ORDER — AMLODIPINE BESYLATE 2.5 MG PO TABS: 2.5000 mg | ORAL_TABLET | Freq: Every day | ORAL | 5 refills | Status: DC

## 2022-10-27 NOTE — Progress Notes (Signed)
HPI Mrs. Parrillo returns today for followup. She is a pleasant 89 yo woman with uncontrolled PAF who underwent AV node ablation several weeks ago. She has done well in the interim with no palpitations. No chest pain or sob. No edema.  Allergies  Allergen Reactions   Erythromycin Diarrhea   Amoxicillin Diarrhea   Penicillins Rash    Did it involve swelling of the face/tongue/throat, SOB, or low BP? No Did it involve sudden or severe rash/hives, skin peeling, or any reaction on the inside of your mouth or nose? No Did you need to seek medical attention at a hospital or doctor's office? Yes When did it last happen?      65 years ago If all above answers are "NO", may proceed with cephalosporin use.     Current Outpatient Medications  Medication Sig Dispense Refill   amLODipine (NORVASC) 2.5 MG tablet Take 1 tablet (2.5 mg total) by mouth daily. 60 tablet 5   acetaminophen (TYLENOL) 500 MG tablet Take 500 mg by mouth every 6 (six) hours as needed (pain).     amitriptyline (ELAVIL) 25 MG tablet TAKE 1 TABLET BY MOUTH DAILY 90 tablet 3   apixaban (ELIQUIS) 5 MG TABS tablet TAKE 1 TABLET(5 MG) BY MOUTH TWICE DAILY 180 tablet 2   BIOTIN PO Take 1 tablet by mouth daily.     Camphor-Menthol-Methyl Sal (SALONPAS) 3.08-20-08 % PTCH Place 1 patch onto the skin daily as needed (pain).     furosemide (LASIX) 20 MG tablet TAKE 1 TABLET(20 MG) BY MOUTH DAILY 90 tablet 1   levothyroxine (SYNTHROID) 125 MCG tablet TAKE 1 TABLET(125 MCG) BY MOUTH DAILY 90 tablet 3   LORazepam (ATIVAN) 0.5 MG tablet Take 0.5 mg by mouth daily as needed for anxiety.     losartan (COZAAR) 50 MG tablet Take 1 tablet (50 mg total) by mouth daily. 90 tablet 3   melatonin 5 MG TABS Take 1 tablet (5 mg total) by mouth at bedtime as needed. Home med.     Multiple Vitamin (MULTIVITAMIN) capsule Take 1 capsule by mouth daily.     naproxen sodium (ALEVE) 220 MG tablet Take 220 mg by mouth daily as needed (pain.).     omeprazole  (PRILOSEC) 20 MG capsule TAKE 1 CAPSULE BY MOUTH DAILY 90 capsule 3   pentosan polysulfate (ELMIRON) 100 MG capsule Take 100 mg by mouth 2 (two) times daily.     psyllium (METAMUCIL SMOOTH TEXTURE) 28 % packet Take 1 packet by mouth daily at 12 noon.     rosuvastatin (CRESTOR) 5 MG tablet TAKE 1 TABLET(5 MG) BY MOUTH DAILY 90 tablet 0   verapamil (CALAN) 40 MG tablet Take 1 tablet (40 mg) by mouth every 4 hours up to 3 doses as needed for recurrent tachycardia 30 tablet 0   No current facility-administered medications for this visit.     Past Medical History:  Diagnosis Date   (HFpEF) heart failure with preserved ejection fraction ([place])    a. 01/2021 Echo: EF 55-60%, no rwma, GrII DD, nl RV fxn, mod elev PASP. Mild MR, mild-mod TR.   Abnormal mammogram, unspecified 2011   Arthritis 2002   Chronic pain syndrome 08/01/2017   GERD (gastroesophageal reflux disease)    Heart murmur 1985   Hyperlipidemia    Hypertension 2005   Hypothyroidism    Persistent atrial fibrillation/Atypical atrial flutter ([place])    a. 04/2019 TEE/DCCV; b. 05/2019 Recurrent Aflutter-->Amio-->DCCV; c. CHA2DS2VASc = 4-->Eliquis; d. 08/2021  recurrent Afib req DCCV.  Flecainide started; e. 09/2021 Flecainide d/c'd 2/2 brady - couldn't tol bb.   PSVT (paroxysmal supraventricular tachycardia)    a. 09/2021 Zio: Predominantly sinus rhythm (67; range 49-102).  Rare PACs with occasional PVCs (PVC burden 3.6%).  44 SVT runs-fastest 190, longest 12.9 seconds.  No sustained arrhythmias/prolonged pauses.  No significant arrhythmia identified corresponding to patient's symptoms.    ROS:   All systems reviewed and negative except as noted in the HPI.   Past Surgical History:  Procedure Laterality Date   APPENDECTOMY  1939   AV NODE ABLATION N/A 09/26/2022   Procedure: AV NODE ABLATION;  Surgeon: Evans Lance, MD;  Location: [facility];  Service: Cardiovascular;  Laterality: N/A;   BREAST BIOPSY Left 02-24-14   benign    BREAST BIOPSY Left 02/11/2016   neg   BREAST BIOPSY Left 2013   neg   BREAST EXCISIONAL BIOPSY Left 2014   neg   BREAST SURGERY Left 2013   stereotactic biopsy    BREAST SURGERY Left May 2010   1 mm foci of atypical ductal hyperplasia   CARDIOVERSION N/A 05/08/2019   Procedure: CARDIOVERSION;  Surgeon: Kate Sable, MD;  Location: [facility];  Service: Cardiovascular;  Laterality: N/A;   CARDIOVERSION N/A 05/31/2019   Procedure: CARDIOVERSION;  Surgeon: Minna Merritts, MD;  Location: [facility];  Service: Cardiovascular;  Laterality: N/A;   CARDIOVERSION N/A 08/25/2021   Procedure: CARDIOVERSION;  Surgeon: Kate Sable, MD;  Location: [facility];  Service: Cardiovascular;  Laterality: N/A;   CARDIOVERSION N/A 12/29/2021   Procedure: CARDIOVERSION;  Surgeon: Kate Sable, MD;  Location: [facility];  Service: Cardiovascular;  Laterality: N/A;   cataract surgery  Bilateral 2009   COLONOSCOPY  2010   Dr. Vira Agar   left breast biopsy   2009   PACEMAKER IMPLANT N/A 01/26/2022   Procedure: PACEMAKER IMPLANT;  Surgeon: Deboraha Sprang, MD;  Location: [facility];  Service: Cardiovascular;  Laterality: N/A;   TEE WITHOUT CARDIOVERSION N/A 05/08/2019   Procedure: TRANSESOPHAGEAL ECHOCARDIOGRAM (TEE);  Surgeon: Kate Sable, MD;  Location: [facility];  Service: Cardiovascular;  Laterality: N/A;     Family History  Problem Relation Age of Onset   Heart disease Mother    Heart disease Father    Diabetes Father    Stroke Father    Arthritis Father      Social History   Socioeconomic History   Marital status: Single    Spouse name: Not on file   Number of children: Not on file   Years of education: Not on file   Highest education level: Not on file  Occupational History   Not on file  Tobacco Use   Smoking status: Never   Smokeless tobacco: Never  Vaping Use   Vaping Use: Never used  Substance and Sexual Activity   Alcohol use: No    Alcohol/week: 0.0  standard drinks of alcohol   Drug use: No   Sexual activity: Not on file  Other Topics Concern   Not on file  Social History Narrative   Not on file   Social Determinants of Health   Financial Resource Strain: Not on file  Food Insecurity: No Food Insecurity (06/27/2022)   Hunger Vital Sign    Worried About Running Out of Food in the Last Year: Never true    Ran Out of Food in the Last Year: Never true  Transportation Needs: No Transportation Needs (06/27/2022)  PRAPARE - Hydrologist (Medical): No    Lack of Transportation (Non-Medical): No  Physical Activity: Not on file  Stress: Not on file  Social Connections: Not on file  Intimate Partner Violence: Not At Risk (06/27/2022)   Humiliation, Afraid, Rape, and Kick questionnaire    Fear of Current or Ex-Partner: No    Emotionally Abused: No    Physically Abused: No    Sexually Abused: No     BP (!) 144/66   Pulse 65   Ht '5\' 3"'$  (1.6 m)   Wt 150 lb 6.4 oz (68.2 kg)   SpO2 96%   BMI 26.64 kg/m   Physical Exam:  Well appearing NAD HEENT: Unremarkable Neck:  No JVD, no thyromegally Lymphatics:  No adenopathy Back:  No CVA tenderness Lungs:  Clear with no wheezes HEART:  Regular rate rhythm, no murmurs, no rubs, no clicks Abd:  soft, positive bowel sounds, no organomegally, no rebound, no guarding Ext:  2 plus pulses, no edema, no cyanosis, no clubbing Skin:  No rashes no nodules Neuro:  CN II through XII intact, motor grossly intact  EKG - NSR with AV pacing  DEVICE  Normal device function.  See PaceArt for details. Escape is at 40/min  Assess/Plan: PAF - she is doing well after AV node ablation in the setting of prior PPM. She is asymptomatic. Continue current meds. HTN -her bp is minimally elevated. I asked her to restart amlodipine 2.5 mg daily.  Coags - she will continue eliquis. No bleeding.   Carleene Overlie Anyelina Claycomb,MD

## 2022-10-27 NOTE — Patient Instructions (Addendum)
Medication Instructions:  Your physician has recommended you make the following change in your medication: Start Taking: Amlodipine 2.5 mg Amlodipine 2.5 mg  You will-  Take 1 tablet (2.5 mg total) by mouth daily    Lab Work: None ordered.  If you have labs (blood work) drawn today and your tests are completely normal, you will receive your results only by: [place] (if you have MyChart) OR A paper copy in the mail If you have any lab test that is abnormal or we need to change your treatment, we will call you to review the results.  Testing/Procedures: None ordered.  Follow-Up: At [facility], you and your health needs are our priority.  As part of our continuing mission to provide you with exceptional heart care, we have created designated Provider Care Teams.  These Care Teams include your primary Cardiologist (physician) and Advanced Practice Providers (APPs -  Physician Assistants and Nurse Practitioners) who all work together to provide you with the care you need, when you need it.  We recommend signing up for the patient portal called "MyChart".  Sign up information is provided on this After Visit Summary.  MyChart is used to connect with patients for Virtual Visits (Telemedicine).  Patients are able to view lab/test results, encounter notes, upcoming appointments, etc.  Non-urgent messages can be sent to your provider as well.   To learn more about what you can do with MyChart, go to [email or web address].    Your next appointment:   1 year(s)  The format for your next appointment:   In Person  Provider:   Cristopher Peru, MD{or one of the following Advanced Practice Providers on your designated Care Team:   Tommye Standard, [place] Legrand Como "Jonni Sanger" Chalmers Cater, [place]  Remote monitoring is used to monitor your Pacemaker from home. This monitoring reduces the number of office visits required to check your device to one time per year. It allows [place] to keep an eye on the functioning  of your device to ensure it is working properly. You are scheduled for a device check from home on 01/25/23. You may send your transmission at any time that day. If you have a wireless device, the transmission will be sent automatically. After your physician reviews your transmission, you will receive a postcard with your next transmission date.

## 2022-10-28 LAB — CUP PACEART REMOTE DEVICE CHECK
Battery Remaining Longevity: 98 mo
Battery Remaining Percentage: 94 %
Battery Voltage: 3.02 V
Brady Statistic AP VP Percent: 59 %
Brady Statistic AP VS Percent: 1 %
Brady Statistic AS VP Percent: 38 %
Brady Statistic AS VS Percent: 1 %
Brady Statistic RA Percent Paced: 53 %
Brady Statistic RV Percent Paced: 98 %
Date Time Interrogation Session: 20240313050758
Implantable Lead Connection Status: 753985
Implantable Lead Connection Status: 753985
Implantable Lead Implant Date: 20230614
Implantable Lead Implant Date: 20230614
Implantable Lead Location: 753859
Implantable Lead Location: 753860
Implantable Lead Model: 1944
Implantable Lead Model: 1948
Implantable Pulse Generator Implant Date: 20230614
Lead Channel Impedance Value: 460 Ohm
Lead Channel Impedance Value: 610 Ohm
Lead Channel Pacing Threshold Amplitude: 0.75 V
Lead Channel Pacing Threshold Amplitude: 0.75 V
Lead Channel Pacing Threshold Pulse Width: 0.5 ms
Lead Channel Pacing Threshold Pulse Width: 0.5 ms
Lead Channel Sensing Intrinsic Amplitude: 10.9 mV
Lead Channel Sensing Intrinsic Amplitude: 2.1 mV
Lead Channel Setting Pacing Amplitude: 2 V
Lead Channel Setting Pacing Amplitude: 2.5 V
Lead Channel Setting Pacing Pulse Width: 0.5 ms
Lead Channel Setting Sensing Sensitivity: 4 mV
Pulse Gen Model: 2272
Pulse Gen Serial Number: 8087558

## 2022-11-30 NOTE — Progress Notes (Signed)
Remote pacemaker transmission.   

## 2022-12-26 ENCOUNTER — Other Ambulatory Visit: Payer: Self-pay | Admitting: Cardiology

## 2023-01-16 ENCOUNTER — Encounter: Payer: Medicare PPO | Admitting: Cardiology

## 2023-01-25 ENCOUNTER — Ambulatory Visit (INDEPENDENT_AMBULATORY_CARE_PROVIDER_SITE_OTHER): Payer: Medicare PPO

## 2023-01-25 DIAGNOSIS — I495 Sick sinus syndrome: Secondary | ICD-10-CM

## 2023-01-25 NOTE — Progress Notes (Signed)
Cardiology Office Note Date:  01/25/2023  Patient ID:  Meagan Wilcox, Meagan Wilcox 02/06/1931, MRN [id number] PCP:  Lauro Regulus, MD  Cardiologist:  Yvonne Kendall, MD Electrophysiologist: Sherryl Manges, MD    Chief Complaint: 6 mon PPM follow-up  History of Present Illness: Meagan Wilcox is a 89 y.o. female with PMH notable for parox AFib, tachy-brady s/p AVN ablation s/p PPM, HFpEF, HTN; seen today for Sherryl Manges, MD for routine electrophysiology followup.   She is s/p AVN ablation w Dr. Ladona Ridgel 09/2022. She was saw Dr. Ladona Ridgel 10/2022.  She was hypertension during the visit and he restarted amlodipine 2.5 mg.  She was otherwise doing well.  Today, the patient states that she continues to have baseline shortness of breath and weakness.  She thinks her blood pressure may have decreased over the last couple days, but did not bring her log of BP readings with her.  She thinks the lowest reading she seen is in the 130s.  Her usual blood pressures are 150-160. She denies palpitations or atrial arrhythmia awareness.  She diligently takes Eliquis twice daily, no missed doses, no bleeding concerns  Her niece joins her for today's appointment. They mention that there have been several deaths of close family members within the last 6 months.   she denies chest pain, palpitations, PND, nausea, vomiting, dizziness, syncope, weight gain, or early satiety.    Device Information: St. Jude dual chamber PPM, imp 01/2022; SSS  AAD History: Amiodarone - side effects Flecainide - side effects  Past Medical History:  Diagnosis Date   (HFpEF) heart failure with preserved ejection fraction (HCC)    a. 01/2021 Echo: EF 55-60%, no rwma, GrII DD, nl RV fxn, mod elev PASP. Mild MR, mild-mod TR.   Abnormal mammogram, unspecified 2011   Arthritis 2002   Chronic pain syndrome 08/01/2017   GERD (gastroesophageal reflux disease)    Heart murmur 1985   Hyperlipidemia    Hypertension 2005   Hypothyroidism     Persistent atrial fibrillation/Atypical atrial flutter (HCC)    a. 04/2019 TEE/DCCV; b. 05/2019 Recurrent Aflutter-->Amio-->DCCV; c. CHA2DS2VASc = 4-->Eliquis; d. 08/2021 recurrent Afib req DCCV.  Flecainide started; e. 09/2021 Flecainide d/c'd 2/2 brady - couldn't tol bb.   PSVT (paroxysmal supraventricular tachycardia)    a. 09/2021 Zio: Predominantly sinus rhythm (67; range 49-102).  Rare PACs with occasional PVCs (PVC burden 3.6%).  44 SVT runs-fastest 190, longest 12.9 seconds.  No sustained arrhythmias/prolonged pauses.  No significant arrhythmia identified corresponding to patient's symptoms.    Past Surgical History:  Procedure Laterality Date   APPENDECTOMY  1939   AV NODE ABLATION N/A 09/26/2022   Procedure: AV NODE ABLATION;  Surgeon: Marinus Maw, MD;  Location: [facility];  Service: Cardiovascular;  Laterality: N/A;   BREAST BIOPSY Left 02-24-14   benign   BREAST BIOPSY Left 02/11/2016   neg   BREAST BIOPSY Left 2013   neg   BREAST EXCISIONAL BIOPSY Left 2014   neg   BREAST SURGERY Left 2013   stereotactic biopsy    BREAST SURGERY Left May 2010   1 mm foci of atypical ductal hyperplasia   CARDIOVERSION N/A 05/08/2019   Procedure: CARDIOVERSION;  Surgeon: Debbe Odea, MD;  Location: [facility];  Service: Cardiovascular;  Laterality: N/A;   CARDIOVERSION N/A 05/31/2019   Procedure: CARDIOVERSION;  Surgeon: Antonieta Iba, MD;  Location: [facility];  Service: Cardiovascular;  Laterality: N/A;   CARDIOVERSION N/A 08/25/2021   Procedure: CARDIOVERSION;  Surgeon: Debbe Odea, MD;  Location: [facility] OR[facility];  Service: Cardiovascular;  Laterality: N/A;   CARDIOVERSION N/A 12/29/2021   Procedure: CARDIOVERSION;  Surgeon: Debbe Odea, MD;  Location: [facility];  Service: Cardiovascular;  Laterality: N/A;   cataract surgery  Bilateral 2009   COLONOSCOPY  2010   Dr. Mechele Collin   left breast biopsy   2009   PACEMAKER IMPLANT N/A 01/26/2022   Procedure: PACEMAKER  IMPLANT;  Surgeon: Duke Salvia, MD;  Location: [facility] INVASIVE CV LAB;  Service: Cardiovascular;  Laterality: N/A;   TEE WITHOUT CARDIOVERSION N/A 05/08/2019   Procedure: TRANSESOPHAGEAL ECHOCARDIOGRAM (TEE);  Surgeon: Debbe Odea, MD;  Location: [facility] OR[facility];  Service: Cardiovascular;  Laterality: N/A;    Current Outpatient Medications  Medication Instructions   acetaminophen (TYLENOL) 500 mg, Oral, Every 6 hours PRN   amitriptyline (ELAVIL) 25 MG tablet TAKE 1 TABLET BY MOUTH DAILY   amLODipine (NORVASC) 2.5 mg, Oral, Daily   apixaban (ELIQUIS) 5 MG TABS tablet TAKE 1 TABLET(5 MG) BY MOUTH TWICE DAILY   BIOTIN PO 1 tablet, Oral, Daily   Camphor-Menthol-Methyl Sal (SALONPAS) 3.08-20-08 % PTCH 1 patch, Transdermal, Daily PRN   furosemide (LASIX) 20 MG tablet TAKE 1 TABLET(20 MG) BY MOUTH DAILY   levothyroxine (SYNTHROID) 125 MCG tablet TAKE 1 TABLET(125 MCG) BY MOUTH DAILY   LORazepam (ATIVAN) 0.5 mg, Oral, Daily PRN   losartan (COZAAR) 50 mg, Oral, Daily   melatonin 5 mg, Oral, At bedtime PRN, Home med.   Multiple Vitamin (MULTIVITAMIN) capsule 1 capsule, Oral, Daily   naproxen sodium (ALEVE) 220 mg, Oral, Daily PRN   omeprazole (PRILOSEC) 20 MG capsule TAKE 1 CAPSULE BY MOUTH DAILY   pentosan polysulfate (ELMIRON) 100 mg, Oral, 2 times daily   psyllium (METAMUCIL SMOOTH TEXTURE) 28 % packet 1 packet, Oral, Daily   rosuvastatin (CRESTOR) 5 MG tablet TAKE 1 TABLET(5 MG) BY MOUTH DAILY   verapamil (CALAN) 40 MG tablet Take 1 tablet (40 mg) by mouth every 4 hours up to 3 doses as needed for recurrent tachycardia    Social History:  The patient  reports that she has never smoked. She has never used smokeless tobacco. She reports that she does not drink alcohol and does not use drugs.   Family History:  The patient's family history includes Arthritis in her father; Diabetes in her father; Heart disease in her father and mother; Stroke in her father.  ROS:  Please see the history of present  illness. All other systems are reviewed and otherwise negative.   PHYSICAL EXAM:  VS:  There were no vitals taken for this visit. BMI: There is no height or weight on file to calculate BMI.  GEN- The patient is well appearing, alert and oriented x 3 today.   Lungs- Clear to ausculation bilaterally, normal work of breathing.  Heart- Regular rate and rhythm, no murmurs, rubs or gallops Extremities- 1+ peripheral edema, warm, dry Skin-  device pocket well-healed, no tethering   Device interrogation done today and reviewed by myself:  Battery 8+ years Presents in AFib Ventricular escape at 36, dependent Lead thresholds, impedence, sensing stable  Paroxysmal A-fib episodes noted, more frequent about 3 days ago No changes made today  EKG is not ordered. Personal review of EKG from  10/27/2022  shows:  AS, VP at 65bpm;   Recent Labs: 07/12/2022: B Natriuretic Peptide 99.4; Magnesium 2.1; TSH 0.645 09/14/2022: ALT 15; BUN 20; Creatinine, Ser 0.87; Hemoglobin 13.4; Platelets 251; Potassium 4.1; Sodium 135  09/14/2022:  Cholesterol 165; HDL 92; LDL Cholesterol 52; Total CHOL/HDL Ratio 1.8; Triglycerides 103; VLDL 21   CrCl cannot be calculated (Patient's most recent lab result is older than the maximum 21 days allowed.).   Wt Readings from Last 3 Encounters:  10/27/22 150 lb 6.4 oz (68.2 kg)  10/06/22 152 lb (68.9 kg)  09/26/22 147 lb (66.7 kg)     Additional studies reviewed include: Previous EP, cardiology notes.   TTE, 02/04/2021 1. Left ventricular ejection fraction, by estimation, is 55 to 60%. The left ventricle has normal function. The left ventricle has no regional wall motion abnormalities. There is mild left ventricular hypertrophy. Left ventricular diastolic parameters are consistent with Grade II diastolic dysfunction (pseudonormalization).   2. Right ventricular systolic function is normal. The right ventricular size is mildly enlarged. There is moderately elevated pulmonary  artery systolic pressure.   3. Right atrial size was mildly dilated.   4. The mitral valve is normal in structure. Mild mitral valve regurgitation.   5. Tricuspid valve regurgitation is mild to moderate.   6. The aortic valve was not well visualized. Aortic valve regurgitation is not visualized.   7. The inferior vena cava is normal in size with greater than 50% respiratory variability, suggesting right atrial pressure of 3 mmHg.   Comparison(s): EF 55-60%.   ASSESSMENT AND PLAN:  #) Tachy-brady  #) PPM in situ #) s/p AVN ablation  Device functioning well, see Paceart for details Junctional escape at 36  #) parox Aflutter, afib Continues to have paroxysms of atrial flutter/A-fib Short of breath, fatigue stable  #) Hypercoag d/t AFlutter CHA2DS2-VASc Score = 5 [CHF History: 1, HTN History: 1, Diabetes History: 0, Stroke History: 0, Vascular Disease History: 0, Age Score: 2, Gender Score: 1].  Therefore, the patient's annual risk of stroke is 7.2 % NOAC - 5mg  eliquis BID, appropriately dosed No bleeding concerns.      #) HTN Normotensive today in office I recommend she continue to check blood pressure daily and record readings and bring those to her appointments Discussed her BP goal of 130-140    Current medicines are reviewed at length with the patient today.   The patient has concerns regarding her medicines.  The following changes were made today:  none  Labs/ tests ordered today include:  No orders of the defined types were placed in this encounter.    Disposition: Follow up with Dr. Graciela Husbands or EP APP in 6 months She prefers to see EP team at [facility] office, and not travel to [facility] unless it is required  SignedSherie Don, NP  01/25/23  4:34 PM  Electrophysiology [facility]

## 2023-01-26 ENCOUNTER — Encounter: Payer: Self-pay | Admitting: Internal Medicine

## 2023-01-26 ENCOUNTER — Ambulatory Visit: Payer: Medicare PPO | Attending: Cardiology | Admitting: Cardiology

## 2023-01-26 ENCOUNTER — Encounter: Payer: Self-pay | Admitting: Cardiology

## 2023-01-26 VITALS — BP 136/66 | HR 70 | Ht 63.0 in | Wt 150.0 lb

## 2023-01-26 DIAGNOSIS — D6869 Other thrombophilia: Secondary | ICD-10-CM

## 2023-01-26 DIAGNOSIS — Z9889 Other specified postprocedural states: Secondary | ICD-10-CM | POA: Insufficient documentation

## 2023-01-26 DIAGNOSIS — I10 Essential (primary) hypertension: Secondary | ICD-10-CM

## 2023-01-26 DIAGNOSIS — I480 Paroxysmal atrial fibrillation: Secondary | ICD-10-CM

## 2023-01-26 DIAGNOSIS — I4892 Unspecified atrial flutter: Secondary | ICD-10-CM

## 2023-01-26 DIAGNOSIS — I495 Sick sinus syndrome: Secondary | ICD-10-CM

## 2023-01-26 DIAGNOSIS — Z950 Presence of cardiac pacemaker: Secondary | ICD-10-CM | POA: Diagnosis not present

## 2023-01-26 LAB — CUP PACEART INCLINIC DEVICE CHECK
Battery Remaining Longevity: 100 mo
Battery Voltage: 3.02 V
Brady Statistic RA Percent Paced: 28 %
Brady Statistic RV Percent Paced: 97 %
Date Time Interrogation Session: 20240613144632
Implantable Lead Connection Status: 753985
Implantable Lead Connection Status: 753985
Implantable Lead Implant Date: 20230614
Implantable Lead Implant Date: 20230614
Implantable Lead Location: 753859
Implantable Lead Location: 753860
Implantable Lead Model: 1944
Implantable Lead Model: 1948
Implantable Pulse Generator Implant Date: 20230614
Lead Channel Impedance Value: 512.5 Ohm
Lead Channel Impedance Value: 762.5 Ohm
Lead Channel Pacing Threshold Amplitude: 0.75 V
Lead Channel Pacing Threshold Amplitude: 0.75 V
Lead Channel Pacing Threshold Pulse Width: 0.5 ms
Lead Channel Pacing Threshold Pulse Width: 0.5 ms
Lead Channel Sensing Intrinsic Amplitude: 1.6 mV
Lead Channel Sensing Intrinsic Amplitude: 10.9 mV
Lead Channel Setting Pacing Amplitude: 2 V
Lead Channel Setting Pacing Amplitude: 2.5 V
Lead Channel Setting Pacing Pulse Width: 0.5 ms
Lead Channel Setting Sensing Sensitivity: 4 mV
Pulse Gen Model: 2272
Pulse Gen Serial Number: 8087558

## 2023-01-26 LAB — CUP PACEART REMOTE DEVICE CHECK
Battery Remaining Longevity: 100 mo
Battery Remaining Percentage: 92 %
Battery Voltage: 3.02 V
Brady Statistic AP VP Percent: 47 %
Brady Statistic AP VS Percent: 1 %
Brady Statistic AS VP Percent: 50 %
Brady Statistic AS VS Percent: 1 %
Brady Statistic RA Percent Paced: 28 %
Brady Statistic RV Percent Paced: 97 %
Date Time Interrogation Session: 20240612215406
Implantable Lead Connection Status: 753985
Implantable Lead Connection Status: 753985
Implantable Lead Implant Date: 20230614
Implantable Lead Implant Date: 20230614
Implantable Lead Location: 753859
Implantable Lead Location: 753860
Implantable Lead Model: 1944
Implantable Lead Model: 1948
Implantable Pulse Generator Implant Date: 20230614
Lead Channel Impedance Value: 490 Ohm
Lead Channel Impedance Value: 680 Ohm
Lead Channel Pacing Threshold Amplitude: 0.5 V
Lead Channel Pacing Threshold Amplitude: 0.75 V
Lead Channel Pacing Threshold Pulse Width: 0.5 ms
Lead Channel Pacing Threshold Pulse Width: 0.5 ms
Lead Channel Sensing Intrinsic Amplitude: 12 mV
Lead Channel Sensing Intrinsic Amplitude: 4.4 mV
Lead Channel Setting Pacing Amplitude: 2 V
Lead Channel Setting Pacing Amplitude: 2.5 V
Lead Channel Setting Pacing Pulse Width: 0.5 ms
Lead Channel Setting Sensing Sensitivity: 4 mV
Pulse Gen Model: 2272
Pulse Gen Serial Number: 8087558

## 2023-01-26 NOTE — Patient Instructions (Signed)
Medication Instructions:  STOP taking verapamil  *If you need a refill on your cardiac medications before your next appointment, please call your pharmacy*   Lab Work: No labs ordered  If you have labs (blood work) drawn today and your tests are completely normal, you will receive your results only by: MyChart Message (if you have MyChart) OR A paper copy in the mail If you have any lab test that is abnormal or we need to change your treatment, we will call you to review the results.   Testing/Procedures: No testing ordered   Follow-Up: At [facility], you and your health needs are our priority.  As part of our continuing mission to provide you with exceptional heart care, we have created designated Provider Care Teams.  These Care Teams include your primary Cardiologist (physician) and Advanced Practice Providers (APPs -  Physician Assistants and Nurse Practitioners) who all work together to provide you with the care you need, when you need it.  We recommend signing up for the patient portal called "MyChart".  Sign up information is provided on this After Visit Summary.  MyChart is used to connect with patients for Virtual Visits (Telemedicine).  Patients are able to view lab/test results, encounter notes, upcoming appointments, etc.  Non-urgent messages can be sent to your provider as well.   To learn more about what you can do with MyChart, go to [email or web address].    Your next appointment:   6 month(s)  Provider:   Sherryl Manges, MD or Sherie Don, NP

## 2023-01-26 NOTE — Progress Notes (Signed)
Spoke with niece and patient about the AF episodes and that HR is now controlled  by AV ablation so as to reassure   they appreciated

## 2023-02-08 ENCOUNTER — Ambulatory Visit: Payer: Medicare PPO | Admitting: Cardiology

## 2023-02-08 ENCOUNTER — Ambulatory Visit: Payer: Medicare PPO | Admitting: Internal Medicine

## 2023-02-20 NOTE — Progress Notes (Signed)
Remote pacemaker transmission.   

## 2023-03-02 ENCOUNTER — Ambulatory Visit: Payer: Medicare PPO | Admitting: Cardiology

## 2023-03-16 ENCOUNTER — Other Ambulatory Visit: Payer: Self-pay | Admitting: Internal Medicine

## 2023-03-16 DIAGNOSIS — I480 Paroxysmal atrial fibrillation: Secondary | ICD-10-CM

## 2023-03-16 NOTE — Telephone Encounter (Signed)
Prescription refill request for Eliquis received. Indication: Aflutter Last office visit: 01/26/23 ([place])  Scr: 0.8 (01/26/23)  Age: 89 Weight: 68kg  Appropriate dose. Refill sent.

## 2023-03-16 NOTE — Telephone Encounter (Signed)
Refill request

## 2023-03-23 ENCOUNTER — Ambulatory Visit: Payer: Medicare PPO | Admitting: Cardiology

## 2023-03-23 NOTE — Progress Notes (Deleted)
  Cardiology Office Note:  .   Date:  03/23/2023  ID:  Meagan Wilcox, DOB [birth year], MRN [id number] PCP: Lauro Regulus, MD  [facility] Providers Cardiologist:  Yvonne Kendall, MD Electrophysiologist:  Sherryl Manges, MD { Click to update primary MD,subspecialty MD or APP then REFRESH:1}   History of Present Illness: .   Meagan Wilcox is a 89 y.o. female ***  ROS: ***  Studies Reviewed: .        *** Risk Assessment/Calculations:   {Does this patient have ATRIAL FIBRILLATION?:[id number]} No BP recorded.  {Refresh Note OR Click here to enter BP  :1}***       Physical Exam:   VS:  There were no vitals taken for this visit.   Wt Readings from Last 3 Encounters:  01/26/23 150 lb (68 kg)  10/27/22 150 lb 6.4 oz (68.2 kg)  10/06/22 152 lb (68.9 kg)    GEN: Well nourished, well developed in no acute distress NECK: No JVD; No carotid bruits CARDIAC: ***RRR, no murmurs, rubs, gallops RESPIRATORY:  Clear to auscultation without rales, wheezing or rhonchi  ABDOMEN: Soft, non-tender, non-distended EXTREMITIES:  No edema; No deformity   ASSESSMENT AND PLAN: .   ***    {Are you ordering a CV Procedure (e.g. stress test, cath, DCCV, TEE, etc)?   Press F2        :[id number]}  Dispo: ***  Signed,  , NP

## 2023-03-24 ENCOUNTER — Other Ambulatory Visit: Payer: Self-pay | Admitting: Internal Medicine

## 2023-03-27 ENCOUNTER — Ambulatory Visit: Payer: Medicare PPO | Attending: Cardiology | Admitting: Physician Assistant

## 2023-03-27 ENCOUNTER — Encounter: Payer: Self-pay | Admitting: Physician Assistant

## 2023-03-27 VITALS — BP 137/69 | HR 70 | Ht 64.0 in | Wt 151.4 lb

## 2023-03-27 DIAGNOSIS — I5032 Chronic diastolic (congestive) heart failure: Secondary | ICD-10-CM

## 2023-03-27 DIAGNOSIS — I4892 Unspecified atrial flutter: Secondary | ICD-10-CM | POA: Diagnosis not present

## 2023-03-27 DIAGNOSIS — I4819 Other persistent atrial fibrillation: Secondary | ICD-10-CM | POA: Diagnosis not present

## 2023-03-27 DIAGNOSIS — E785 Hyperlipidemia, unspecified: Secondary | ICD-10-CM

## 2023-03-27 DIAGNOSIS — I10 Essential (primary) hypertension: Secondary | ICD-10-CM | POA: Diagnosis not present

## 2023-03-27 DIAGNOSIS — Z950 Presence of cardiac pacemaker: Secondary | ICD-10-CM

## 2023-03-27 DIAGNOSIS — Z9889 Other specified postprocedural states: Secondary | ICD-10-CM

## 2023-03-27 NOTE — Patient Instructions (Addendum)
Medication Instructions:  Your Physician recommend you continue on your current medication as directed.     *If you need a refill on your cardiac medications before your next appointment, please call your pharmacy*   Lab Work: NONE   Testing/Procedures:  Your physician has requested that you have an echocardiogram in four months (same day as office visit). Echocardiography is a painless test that uses sound waves to create images of your heart. It provides your doctor with information about the size and shape of your heart and how well your heart's chambers and valves are working.   You may receive an ultrasound enhancing agent through an IV if needed to better visualize your heart during the echo. This procedure takes approximately one hour.  There are no restrictions for this procedure.  This will take place at [address] [address], [place] [address]    Follow-Up: At [facility], you and your health needs are our priority.  As part of our continuing mission to provide you with exceptional heart care, we have created designated Provider Care Teams.  These Care Teams include your primary Cardiologist (physician) and Advanced Practice Providers (APPs -  Physician Assistants and Nurse Practitioners) who all work together to provide you with the care you need, when you need it.  We recommend signing up for the patient portal called "MyChart".  Sign up information is provided on this After Visit Summary.  MyChart is used to connect with patients for Virtual Visits (Telemedicine).  Patients are able to view lab/test results, encounter notes, upcoming appointments, etc.  Non-urgent messages can be sent to your provider as well.   To learn more about what you can do with MyChart, go to [email or web address].    Your next appointment:   4 month(s)  Provider:   You may see Yvonne Kendall, MD  or one of the following Advanced Practice Providers on your  designated Care Team:    Eula Listen, [place]

## 2023-03-27 NOTE — Progress Notes (Signed)
Cardiology Office Note    Date:  03/27/2023   ID:  Meagan Wilcox, DOB 01-Feb-1931, MRN [id number]  PCP:  Lauro Regulus, MD  Cardiologist:  Yvonne Kendall, MD  Electrophysiologist:  Sherryl Manges, MD   Chief Complaint: Follow up  History of Present Illness:   Meagan Wilcox is a 89 y.o. 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  She was diagnosed with A-fib/flutter in 04/2019 and underwent TEE guided cardioversion.  She had recurrent tachypalpitations in 05/2019 and was found to be in atrial flutter with 2-1 AV conduction.  She was placed on amiodarone and underwent repeat DCCV, however did not tolerate amiodarone long-term due to nausea, fatigue, and bradycardia.  Outpatient cardiac monitoring showed an average heart rate of 55 bpm with a 5.1% PVC burden, 10 brief atrial runs, and no evidence of recurrent A-fib or flutter.  Echo at that time revealed an EF of 55 to 60%, grade 2 diastolic dysfunction, no regional wall motion abnormalities, moderately elevated PASP, mild mitral regurgitation and mild to moderate tricuspid regurgitation.  She was found to be in recurrent atrial flutter with RVR in 08/2021 and was placed on flecainide and nadolol by EP.  She underwent TEE guided DCCV and was noted to be bradycardic post cardioversion leading nadolol to be discontinued.  In 09/2021 she remained bradycardic with flecainide being discontinued.  Repeat outpatient cardiac monitoring showed no recurrence of A-fib/flutter with 44 brief episodes of atrial tachycardia.  In 11/2021 she had repeat episodes of recurrent tachypalpitations and was placed on verapamil as needed.  She subsequently underwent pacemaker implantation in 01/2022, though continued to have significant fatigue and  weakness, despite discontinuation of diltiazem leading to his reinitiation.  She was seen in the ED twice in 06/2022 for A-fib with RVR.  In this setting, she subsequently underwent AV nodal ablation in 09/2022.  She was seen by general cardiology in 09/2018 for and felt a little bit better with less fatigue and no further palpitations following AV nodal ablation.  Exertional dyspnea with strenuous activities remain unchanged.  Diltiazem was discontinued.  She comes in accompanied by her niece today who is doing well from a cardiac perspective.  No symptoms of angina or cardiac decompensation.  She notes stable chronic fatigue and weakness that are typically exacerbated if she has to stand for extended timeframe secondary to knee arthritis.  If she is standing for prolonged timeframe she also notes some shortness of breath.  No dizziness, presyncope, or syncope.  She notes that she does typically wear compression socks, though has not worn them this summer secondary to heat.  No falls or symptoms concerning for bleeding.  Weight stable.  Blood pressure readings reasonably controlled in the 120s to 140s systolic for the most part with a reading in the 150s systolic.  Overall, patient and niece feel like the patient's quality of life has improved following AV nodal ablation.   Labs independently reviewed: 01/2023 - potassium 4.3, BUN 19, serum creatinine 0.8, albumin 4.3, AST/ALT normal, A1c 6.1, TC 150, TG 79, HDL 79, LDL 55, Hgb 12.6, PLT 195, TSH 0.367  Past Medical History:  Diagnosis Date   (HFpEF) heart failure with preserved ejection fraction (HCC)    a. 01/2021 Echo: EF 55-60%, no rwma, GrII DD, nl RV fxn, mod  elev PASP. Mild MR, mild-mod TR.   Abnormal mammogram, unspecified 2011   Arthritis 2002   Chronic pain syndrome 08/01/2017   GERD (gastroesophageal reflux disease)    Heart murmur 1985   Hyperlipidemia    Hypertension 2005   Hypothyroidism    Persistent atrial fibrillation/Atypical  atrial flutter (HCC)    a. 04/2019 TEE/DCCV; b. 05/2019 Recurrent Aflutter-->Amio-->DCCV; c. CHA2DS2VASc = 4-->Eliquis; d. 08/2021 recurrent Afib req DCCV.  Flecainide started; e. 09/2021 Flecainide d/c'd 2/2 brady - couldn't tol bb.   PSVT (paroxysmal supraventricular tachycardia)    a. 09/2021 Zio: Predominantly sinus rhythm (67; range 49-102).  Rare PACs with occasional PVCs (PVC burden 3.6%).  44 SVT runs-fastest 190, longest 12.9 seconds.  No sustained arrhythmias/prolonged pauses.  No significant arrhythmia identified corresponding to patient's symptoms.    Past Surgical History:  Procedure Laterality Date   APPENDECTOMY  1939   AV NODE ABLATION N/A 09/26/2022   Procedure: AV NODE ABLATION;  Surgeon: Marinus Maw, MD;  Location: [facility];  Service: Cardiovascular;  Laterality: N/A;   BREAST BIOPSY Left 02-24-14   benign   BREAST BIOPSY Left 02/11/2016   neg   BREAST BIOPSY Left 2013   neg   BREAST EXCISIONAL BIOPSY Left 2014   neg   BREAST SURGERY Left 2013   stereotactic biopsy    BREAST SURGERY Left May 2010   1 mm foci of atypical ductal hyperplasia   CARDIOVERSION N/A 05/08/2019   Procedure: CARDIOVERSION;  Surgeon: Debbe Odea, MD;  Location: [facility] OR[facility];  Service: Cardiovascular;  Laterality: N/A;   CARDIOVERSION N/A 05/31/2019   Procedure: CARDIOVERSION;  Surgeon: Antonieta Iba, MD;  Location: [facility] OR[facility];  Service: Cardiovascular;  Laterality: N/A;   CARDIOVERSION N/A 08/25/2021   Procedure: CARDIOVERSION;  Surgeon: Debbe Odea, MD;  Location: [facility];  Service: Cardiovascular;  Laterality: N/A;   CARDIOVERSION N/A 12/29/2021   Procedure: CARDIOVERSION;  Surgeon: Debbe Odea, MD;  Location: [facility] OR[facility];  Service: Cardiovascular;  Laterality: N/A;   cataract surgery  Bilateral 2009   COLONOSCOPY  2010   Dr. Mechele Collin   left breast biopsy   2009   PACEMAKER IMPLANT N/A 01/26/2022   Procedure: PACEMAKER IMPLANT;  Surgeon: Duke Salvia, MD;   Location: [facility] INVASIVE CV LAB;  Service: Cardiovascular;  Laterality: N/A;   TEE WITHOUT CARDIOVERSION N/A 05/08/2019   Procedure: TRANSESOPHAGEAL ECHOCARDIOGRAM (TEE);  Surgeon: Debbe Odea, MD;  Location: [facility];  Service: Cardiovascular;  Laterality: N/A;    Current Medications: Current Meds  Medication Sig   acetaminophen (TYLENOL) 500 MG tablet Take 500 mg by mouth every 6 (six) hours as needed (pain).   amitriptyline (ELAVIL) 25 MG tablet TAKE 1 TABLET BY MOUTH DAILY   amLODipine (NORVASC) 2.5 MG tablet Take 1 tablet (2.5 mg total) by mouth daily.   apixaban (ELIQUIS) 5 MG TABS tablet TAKE 1 TABLET(5 MG) BY MOUTH TWICE DAILY   BIOTIN PO Take 1 tablet by mouth daily.   Camphor-Menthol-Methyl Sal (SALONPAS) 3.08-20-08 % PTCH Place 1 patch onto the skin daily as needed (pain).   furosemide (LASIX) 20 MG tablet TAKE 1 TABLET(20 MG) BY MOUTH DAILY   levothyroxine (SYNTHROID) 125 MCG tablet TAKE 1 TABLET(125 MCG) BY MOUTH DAILY   LORazepam (ATIVAN) 0.5 MG tablet Take 0.5 mg by mouth daily as needed for anxiety.   losartan (COZAAR) 50 MG tablet Take 1 tablet (50 mg total) by mouth daily.   melatonin 5 MG TABS Take 1 tablet (5 mg  total) by mouth at bedtime as needed. Home med.   Multiple Vitamin (MULTIVITAMIN) capsule Take 1 capsule by mouth daily.   naproxen sodium (ALEVE) 220 MG tablet Take 220 mg by mouth daily as needed (pain.).   omeprazole (PRILOSEC) 20 MG capsule TAKE 1 CAPSULE BY MOUTH DAILY   pentosan polysulfate (ELMIRON) 100 MG capsule Take 100 mg by mouth 2 (two) times daily.   psyllium (METAMUCIL SMOOTH TEXTURE) 28 % packet Take 1 packet by mouth daily at 12 noon.   rosuvastatin (CRESTOR) 5 MG tablet TAKE 1 TABLET(5 MG) BY MOUTH DAILY    Allergies:   Erythromycin, Amoxicillin, and Penicillins   Social History   Socioeconomic History   Marital status: Single    Spouse name: Not on file   Number of children: Not on file   Years of education: Not on file   Highest  education level: Not on file  Occupational History   Not on file  Tobacco Use   Smoking status: Never   Smokeless tobacco: Never  Vaping Use   Vaping status: Never Used  Substance and Sexual Activity   Alcohol use: No    Alcohol/week: 0.0 standard drinks of alcohol   Drug use: No   Sexual activity: Not on file  Other Topics Concern   Not on file  Social History Narrative   Not on file   Social Determinants of Health   Financial Resource Strain: Low Risk  (02/02/2023)   Received from [facility], [facility]   Overall Financial Resource Strain (CARDIA)    Difficulty of Paying Living Expenses: Not hard at all  Food Insecurity: No Food Insecurity (02/02/2023)   Received from [facility], [facility]   Hunger Vital Sign    Worried About Running Out of Food in the Last Year: Never true    Ran Out of Food in the Last Year: Never true  Transportation Needs: No Transportation Needs (02/02/2023)   Received from [facility], [facility]   [facility] - Transportation    In the past 12 months, has lack of transportation kept you from medical appointments or from getting medications?: No    Lack of Transportation (Non-Medical): No  Physical Activity: Not on file  Stress: Not on file  Social Connections: Not on file     Family History:  The patient's family history includes Arthritis in her father; Diabetes in her father; Heart disease in her father and mother; Stroke in her father.  ROS:   12-point review of systems is negative unless otherwise noted in the HPI.   EKGs/Labs/Other Studies Reviewed:    Studies reviewed were summarized above. The additional studies were reviewed today:  Cardiac Studies & Procedures       ECHOCARDIOGRAM  ECHOCARDIOGRAM COMPLETE 02/04/2021  Narrative ECHOCARDIOGRAM REPORT    Patient Name:   PHOEBEE AUTREY [facility] Date of Exam: 02/04/2021 Medical Rec  #:  [id number]     Height:       64.0 in Accession #:    [id number]    Weight:       164.0 lb Date of Birth:  Sep 07, 1930     BSA:          1.798 m Patient Age:    89 years      BP:           160/66 mmHg Patient Gender: F  HR:           43 bpm. Exam Location:  [place]  Procedure: 2D Echo, Cardiac Doppler and Color Doppler  Indications:    R06.9 DOE; R60.0 Lower extremity edema  History:        Patient has prior history of Echocardiogram examinations, most recent 05/08/2019. Ablation, Arrythmias:Atrial Flutter and Atrial Fibrillation, Signs/Symptoms:Edema and Dyspnea; Risk Factors:Hypertension, Non-Smoker and Dyslipidemia. Patient denies chest pain and SOB. She does have DOE for about 1 year and leg edema for the last 2 months.  Sonographer:    Carlos American RVT, RDCS (AE), RDMS Referring Phys: 51 CHRISTOPHER END   Sonographer Comments: Suboptimal parasternal window and patient is morbidly obese. Image acquisition challenging due to patient body habitus. IMPRESSIONS   1. Left ventricular ejection fraction, by estimation, is 55 to 60%. The left ventricle has normal function. The left ventricle has no regional wall motion abnormalities. There is mild left ventricular hypertrophy. Left ventricular diastolic parameters are consistent with Grade II diastolic dysfunction (pseudonormalization). 2. Right ventricular systolic function is normal. The right ventricular size is mildly enlarged. There is moderately elevated pulmonary artery systolic pressure. 3. Right atrial size was mildly dilated. 4. The mitral valve is normal in structure. Mild mitral valve regurgitation. 5. Tricuspid valve regurgitation is mild to moderate. 6. The aortic valve was not well visualized. Aortic valve regurgitation is not visualized. 7. The inferior vena cava is normal in size with greater than 50% respiratory variability, suggesting right atrial pressure of 3 mmHg.  Comparison(s): EF  55-60%.  FINDINGS Left Ventricle: Left ventricular ejection fraction, by estimation, is 55 to 60%. The left ventricle has normal function. The left ventricle has no regional wall motion abnormalities. The left ventricular internal cavity size was normal in size. There is mild left ventricular hypertrophy. Left ventricular diastolic parameters are consistent with Grade II diastolic dysfunction (pseudonormalization).  Right Ventricle: The right ventricular size is mildly enlarged. No increase in right ventricular wall thickness. Right ventricular systolic function is normal. There is moderately elevated pulmonary artery systolic pressure. The tricuspid regurgitant velocity is 3.77 m/s, and with an assumed right atrial pressure of 3 mmHg, the estimated right ventricular systolic pressure is 59.9 mmHg.  Left Atrium: Left atrial size was normal in size.  Right Atrium: Right atrial size was mildly dilated.  Pericardium: There is no evidence of pericardial effusion.  Mitral Valve: The mitral valve is normal in structure. Mild mitral valve regurgitation.  Tricuspid Valve: The tricuspid valve is normal in structure. Tricuspid valve regurgitation is mild to moderate.  Aortic Valve: The aortic valve was not well visualized. Aortic valve regurgitation is not visualized. Aortic valve mean gradient measures 6.0 mmHg. Aortic valve peak gradient measures 11.0 mmHg.  Pulmonic Valve: The pulmonic valve was not well visualized. Pulmonic valve regurgitation is not visualized.  Aorta: The aortic root is normal in size and structure.  Venous: The inferior vena cava is normal in size with greater than 50% respiratory variability, suggesting right atrial pressure of 3 mmHg.  IAS/Shunts: No atrial level shunt detected by color flow Doppler.    LV Volumes (MOD) LV vol d, MOD A2C: 65.7 ml Diastology LV vol d, MOD A4C: 66.6 ml LV e' medial:    5.78 cm/s LV vol s, MOD A2C: 22.2 ml LV E/e' medial:  20.2 LV vol  s, MOD A4C: 23.6 ml LV e' lateral:   11.20 cm/s LV SV MOD A2C:     43.5 ml LV E/e' lateral: 10.4 LV  SV MOD A4C:     66.6 ml LV SV MOD BP:      43.9 ml  3D Volume EF: 3D EF:        71 % LV EDV:       123 ml LV ESV:       36 ml LV SV:        88 ml  RIGHT VENTRICLE RV S prime:     11.90 cm/s TAPSE (M-mode): 2.4 cm  LEFT ATRIUM             Index       RIGHT ATRIUM           Index LA Vol (A2C):   93.5 ml 52.00 ml/m RA Area:     20.30 cm LA Vol (A4C):   32.2 ml 17.91 ml/m RA Volume:   62.10 ml  34.54 ml/m LA Biplane Vol: 59.5 ml 33.09 ml/m AORTIC VALVE                    PULMONIC VALVE AV Vmax:           166.00 cm/s  PV Vmax:       0.96 m/s AV Vmean:          120.000 cm/s PV Peak grad:  3.7 mmHg AV VTI:            0.392 m AV Peak Grad:      11.0 mmHg AV Mean Grad:      6.0 mmHg LVOT Vmax:         120.00 cm/s LVOT Vmean:        83.600 cm/s LVOT VTI:          0.302 m LVOT/AV VTI ratio: 0.77  AORTA Ao Root diam: 2.30 cm Ao Asc diam:  3.10 cm Ao Arch diam: 2.8 cm  MITRAL VALVE                TRICUSPID VALVE MV Area (PHT): 3.89 cm     TR Peak grad:   56.9 mmHg MV Decel Time: 195 msec     TR Vmax:        377.00 cm/s MV E velocity: 117.00 cm/s MV A velocity: 50.30 cm/s   SHUNTS MV E/A ratio:  2.33         Systemic VTI: 0.30 m  Debbe Odea MD Electronically signed by Debbe Odea MD Signature Date/Time: 02/04/2021/2:27:13 PM    Final   TEE  ECHO TEE 05/08/2019  Narrative TRANSESOPHOGEAL ECHO REPORT    Patient Name:   MARCE SCURRY [facility] Date of Exam: 05/08/2019 Medical Rec #:  [id number]     Height:       66.0 in Accession #:    [id number]    Weight:       168.5 lb Date of Birth:  Mar 05, 1931     BSA:          1.86 m Patient Age:    88 years      BP:           130/68 mmHg Patient Gender: F             HR:           98 bpm. Exam Location:  [facility]   Procedure: Transesophageal Echo, Color Doppler and Cardiac Doppler  Indications:     Atrial Flutter  427.32  History:         Patient has prior history of Echocardiogram examinations,  most recent 05/07/2019. Hyperlipidemia.  Sonographer:     Cristela Blue RDCS (AE) Referring Phys:  [id number] Lennon Alstrom Diagnosing Phys: Debbe Odea MD    PROCEDURE: Normal Transesophogeal exam. The TEE was followed by a successful Cardioversion. No evidence of intracardiac thrombus. The transesophogeal probe was passed through the esophogus of the patient. Image quality was good. The patient developed no complications during the procedure.  IMPRESSIONS   1. Left ventricular ejection fraction, by visual estimation, is 55 to 60%. The left ventricle has normal function. Normal left ventricular size. There is no left ventricular hypertrophy. 2. Global right ventricle has normal systolic function.The right ventricular size is normal. Right vetricular wall thickness was not assessed. 3. Left atrial size was not well visualized. 4. Mildly dilated. no evidence of LA thrombus, velocity of 50cm/s in LA appendage. 5. Right atrial size was normal. 6. The mitral valve is normal in structure. Mild mitral valve regurgitation. 7. The tricuspid valve is normal in structure. Tricuspid valve regurgitation is mild. 8. The aortic valve is tricuspid Aortic valve regurgitation was not visualized by color flow Doppler. Structurally normal aortic valve, with no evidence of sclerosis or stenosis. 9. The pulmonic valve was not well visualized. Pulmonic valve regurgitation is not visualized by color flow Doppler.  FINDINGS Left Ventricle: Left ventricular ejection fraction, by visual estimation, is 55 to 60%. The left ventricle has normal function. There is no left ventricular hypertrophy. Normal left ventricular size.  Right Ventricle: The right ventricular size is normal. Right vetricular wall thickness was not assessed. Global RV systolic function is has normal systolic function.  Left Atrium: Left atrial size was not  well visualized. Mildly dilated. no evidence of LA thrombus, velocity of 50cm/s in LA appendage.  Right Atrium: Right atrial size was normal in size  Pericardium: There is no evidence of pericardial effusion.  Mitral Valve: The mitral valve is normal in structure. Mild mitral valve regurgitation.  Tricuspid Valve: The tricuspid valve is normal in structure. Tricuspid valve regurgitation is mild by color flow Doppler.  Aortic Valve: The aortic valve is tricuspid. Aortic valve regurgitation was not visualized by color flow Doppler. The aortic valve is structurally normal, with no evidence of sclerosis or stenosis.  Pulmonic Valve: The pulmonic valve was not well visualized. Pulmonic valve regurgitation is not visualized by color flow Doppler.  Aorta: The aortic root is normal in size and structure.  Venous: The left upper pulmonary vein is normal.  Shunts: No atrial level shunt detected by color flow Doppler.    AORTA                 Normals Ao Root diam: 2.85 cm 31 mm   Debbe Odea MD Electronically signed by Debbe Odea MD Signature Date/Time: 05/08/2019/6:19:26 PM    Final   MONITORS  LONG TERM MONITOR-LIVE TELEMETRY (3-14 DAYS) 10/06/2021  Narrative  The patient was monitored for 12 days.  The predominant rhythm was sinus with an average rate of 67 bpm (range 49-102 bpm in sinus).  There were rare PACs and occasional PVCs (PVC burden 3.6%).  44 atrial runs occurred, lasting up to 12.9 seconds with a maximum rate of 190 bpm.  No sustained arrhythmia or prolonged pause was observed.  Patient triggered events correspond to sinus rhythm and PVCs.  Predominantly sinus rhythm with rare PACs and occasional PVCs.  Multiple runs of PSVT noted.  No significant arrhythmia identified corresponding to patient's symptoms.  EKG:  EKG is ordered today.  The EKG ordered today demonstrates V-paced rhythm, 70 bpm  Recent Labs: 07/12/2022: B Natriuretic  Peptide 99.4; Magnesium 2.1; TSH 0.645 09/14/2022: ALT 15; BUN 20; Creatinine, Ser 0.87; Hemoglobin 13.4; Platelets 251; Potassium 4.1; Sodium 135  Recent Lipid Panel    Component Value Date/Time   CHOL 165 09/14/2022 1058   TRIG 103 09/14/2022 1058   HDL 92 09/14/2022 1058   CHOLHDL 1.8 09/14/2022 1058   VLDL 21 09/14/2022 1058   LDLCALC 52 09/14/2022 1058    PHYSICAL EXAM:    VS:  BP 137/69 (BP Location: Left Arm, Patient Position: Sitting, Cuff Size: Normal)   Pulse 70   Ht 5\' 4"  (1.626 m)   Wt 151 lb 6.4 oz (68.7 kg)   SpO2 95%   BMI 25.99 kg/m   BMI: Body mass index is 25.99 kg/m.  Physical Exam Vitals reviewed.  Constitutional:      Appearance: She is well-developed.  HENT:     Head: Normocephalic and atraumatic.  Eyes:     General:        Right eye: No discharge.        Left eye: No discharge.  Neck:     Vascular: No JVD.  Cardiovascular:     Rate and Rhythm: Normal rate and regular rhythm.     Heart sounds: Normal heart sounds, S1 normal and S2 normal. Heart sounds not distant. No midsystolic click and no opening snap. No murmur heard.    No friction rub.  Pulmonary:     Effort: Pulmonary effort is normal. No respiratory distress.     Breath sounds: Normal breath sounds. No decreased breath sounds, wheezing or rales.  Chest:     Chest wall: No tenderness.  Abdominal:     General: There is no distension.  Musculoskeletal:     Cervical back: Normal range of motion.  Skin:    General: Skin is warm and dry.     Nails: There is no clubbing.  Neurological:     Mental Status: She is alert and oriented to person, place, and time.  Psychiatric:        Speech: Speech normal.        Behavior: Behavior normal.        Thought Content: Thought content normal.        Judgment: Judgment normal.     Wt Readings from Last 3 Encounters:  03/27/23 151 lb 6.4 oz (68.7 kg)  01/26/23 150 lb (68 kg)  10/27/22 150 lb 6.4 oz (68.2 kg)     ASSESSMENT & PLAN:   HFpEF:  Euvolemic and well compensated, on low-dose furosemide.  In the context of advanced age, antibiotics and minimize polypharmacy, defer addition of MRA or SGLT2 inhibitor given concern for off target effects.  Recent labs stable.  With some noted dyspnea with prolonged standing, obtain echo to evaluate for new cardiomyopathy.  Persistent A-fib/flutter with tachybradycardia syndrome status post dual-chamber PPM and AV nodal ablation: Device appears to be functioning normally.  CHA2DS2-VASc at least 5.  She remains on apixaban 5 mg twice daily and does not be reduced dosing criteria.  No falls or symptoms concerning for bleeding.  Follow-up with EP as directed.  HTN: Blood pressure is well-controlled in the office.  She remains on amlodipine and losartan.  HLD: LDL 55 in 01/2023 with normal AST/ALT at that time.  She remains on rosuvastatin.   Disposition: F/u with Dr. Okey Dupre or an APP in  4 months, and EP as directed.    Medication Adjustments/Labs and Tests Ordered: Current medicines are reviewed at length with the patient today.  Concerns regarding medicines are outlined above. Medication changes, Labs and Tests ordered today are summarized above and listed in the Patient Instructions accessible in Encounters.   Signed, Eula Listen, PA-C 03/27/2023 4:26 PM     [facility] [place] [address] [place], [place] [address] [phone]

## 2023-03-28 ENCOUNTER — Other Ambulatory Visit: Payer: Self-pay | Admitting: Internal Medicine

## 2023-03-29 ENCOUNTER — Ambulatory Visit: Payer: Medicare PPO | Admitting: Nurse Practitioner

## 2023-04-26 ENCOUNTER — Ambulatory Visit: Payer: Medicare PPO

## 2023-04-26 DIAGNOSIS — I495 Sick sinus syndrome: Secondary | ICD-10-CM | POA: Diagnosis not present

## 2023-04-26 LAB — CUP PACEART REMOTE DEVICE CHECK
Battery Remaining Longevity: 97 mo
Battery Remaining Percentage: 89 %
Battery Voltage: 3.02 V
Brady Statistic AP VP Percent: 47 %
Brady Statistic AP VS Percent: 1 %
Brady Statistic AS VP Percent: 53 %
Brady Statistic AS VS Percent: 1 %
Brady Statistic RA Percent Paced: 33 %
Brady Statistic RV Percent Paced: 99 %
Date Time Interrogation Session: 20240911023513
Implantable Lead Connection Status: 753985
Implantable Lead Connection Status: 753985
Implantable Lead Implant Date: 20230614
Implantable Lead Implant Date: 20230614
Implantable Lead Location: 753859
Implantable Lead Location: 753860
Implantable Lead Model: 1944
Implantable Lead Model: 1948
Implantable Pulse Generator Implant Date: 20230614
Lead Channel Impedance Value: 530 Ohm
Lead Channel Impedance Value: 710 Ohm
Lead Channel Pacing Threshold Amplitude: 0.5 V
Lead Channel Pacing Threshold Amplitude: 0.75 V
Lead Channel Pacing Threshold Pulse Width: 0.5 ms
Lead Channel Pacing Threshold Pulse Width: 0.5 ms
Lead Channel Sensing Intrinsic Amplitude: 12 mV
Lead Channel Sensing Intrinsic Amplitude: 4.2 mV
Lead Channel Setting Pacing Amplitude: 2 V
Lead Channel Setting Pacing Amplitude: 2.5 V
Lead Channel Setting Pacing Pulse Width: 0.5 ms
Lead Channel Setting Sensing Sensitivity: 4 mV
Pulse Gen Model: 2272
Pulse Gen Serial Number: 8087558

## 2023-05-11 NOTE — Progress Notes (Signed)
Remote pacemaker transmission.   

## 2023-05-15 ENCOUNTER — Other Ambulatory Visit: Payer: Self-pay | Admitting: Internal Medicine

## 2023-07-03 ENCOUNTER — Other Ambulatory Visit: Payer: Self-pay | Admitting: Internal Medicine

## 2023-07-13 ENCOUNTER — Other Ambulatory Visit: Payer: Self-pay | Admitting: Internal Medicine

## 2023-07-13 DIAGNOSIS — I480 Paroxysmal atrial fibrillation: Secondary | ICD-10-CM

## 2023-07-17 ENCOUNTER — Other Ambulatory Visit: Payer: Self-pay

## 2023-07-17 DIAGNOSIS — I480 Paroxysmal atrial fibrillation: Secondary | ICD-10-CM

## 2023-07-17 MED ORDER — APIXABAN 5 MG PO TABS: 5.0000 mg | ORAL_TABLET | Freq: Two times a day (BID) | ORAL | 1 refills | Status: DC

## 2023-07-17 MED ORDER — FUROSEMIDE 20 MG PO TABS: 20.0000 mg | ORAL_TABLET | Freq: Every day | ORAL | 0 refills | Status: DC

## 2023-07-17 NOTE — Telephone Encounter (Signed)
Requested Prescriptions   Signed Prescriptions Disp Refills   furosemide (LASIX) 20 MG tablet 90 tablet 0    Sig: Take 1 tablet (20 mg total) by mouth daily.    Authorizing Provider: END, Cristal Deer    Ordering User: Feliberto Harts L     last visit: 03/27/23 with plan to f/u in 4 months. next visit: 07/28/23

## 2023-07-17 NOTE — Telephone Encounter (Signed)
Prescription refill request for Eliquis received. Indication: Aflutter Last office visit: 03/27/23 ([place]) Scr: 0.8 (01/26/23)  Age: 89 Weight: 68.7kg  Appropriate dose. Refill sent.

## 2023-07-26 ENCOUNTER — Ambulatory Visit (INDEPENDENT_AMBULATORY_CARE_PROVIDER_SITE_OTHER): Payer: Medicare PPO

## 2023-07-26 DIAGNOSIS — I495 Sick sinus syndrome: Secondary | ICD-10-CM

## 2023-07-26 LAB — CUP PACEART REMOTE DEVICE CHECK
Battery Remaining Longevity: 95 mo
Battery Remaining Percentage: 86 %
Battery Voltage: 3.02 V
Brady Statistic AP VP Percent: 47 %
Brady Statistic AP VS Percent: 1 %
Brady Statistic AS VP Percent: 52 %
Brady Statistic AS VS Percent: 1 %
Brady Statistic RA Percent Paced: 32 %
Brady Statistic RV Percent Paced: 99 %
Date Time Interrogation Session: 20241211021544
Implantable Lead Connection Status: 753985
Implantable Lead Connection Status: 753985
Implantable Lead Implant Date: 20230614
Implantable Lead Implant Date: 20230614
Implantable Lead Location: 753859
Implantable Lead Location: 753860
Implantable Lead Model: 1944
Implantable Lead Model: 1948
Implantable Pulse Generator Implant Date: 20230614
Lead Channel Impedance Value: 510 Ohm
Lead Channel Impedance Value: 740 Ohm
Lead Channel Pacing Threshold Amplitude: 0.5 V
Lead Channel Pacing Threshold Amplitude: 0.75 V
Lead Channel Pacing Threshold Pulse Width: 0.5 ms
Lead Channel Pacing Threshold Pulse Width: 0.5 ms
Lead Channel Sensing Intrinsic Amplitude: 12 mV
Lead Channel Sensing Intrinsic Amplitude: 4.4 mV
Lead Channel Setting Pacing Amplitude: 2 V
Lead Channel Setting Pacing Amplitude: 2.5 V
Lead Channel Setting Pacing Pulse Width: 0.5 ms
Lead Channel Setting Sensing Sensitivity: 4 mV
Pulse Gen Model: 2272
Pulse Gen Serial Number: 8087558

## 2023-07-28 ENCOUNTER — Ambulatory Visit: Payer: Medicare PPO | Attending: Physician Assistant

## 2023-07-28 ENCOUNTER — Ambulatory Visit: Payer: Medicare PPO | Admitting: Medical

## 2023-07-28 ENCOUNTER — Encounter: Payer: Self-pay | Admitting: Medical

## 2023-07-28 VITALS — BP 126/60 | HR 70 | Ht 64.0 in | Wt 154.4 lb

## 2023-07-28 DIAGNOSIS — I5032 Chronic diastolic (congestive) heart failure: Secondary | ICD-10-CM

## 2023-07-28 DIAGNOSIS — I483 Typical atrial flutter: Secondary | ICD-10-CM

## 2023-07-28 DIAGNOSIS — I495 Sick sinus syndrome: Secondary | ICD-10-CM

## 2023-07-28 DIAGNOSIS — Z950 Presence of cardiac pacemaker: Secondary | ICD-10-CM

## 2023-07-28 DIAGNOSIS — Z9889 Other specified postprocedural states: Secondary | ICD-10-CM | POA: Diagnosis not present

## 2023-07-28 DIAGNOSIS — I4819 Other persistent atrial fibrillation: Secondary | ICD-10-CM | POA: Diagnosis not present

## 2023-07-28 DIAGNOSIS — I10 Essential (primary) hypertension: Secondary | ICD-10-CM

## 2023-07-28 DIAGNOSIS — E785 Hyperlipidemia, unspecified: Secondary | ICD-10-CM

## 2023-07-28 LAB — ECHOCARDIOGRAM COMPLETE
AR max vel: 1.57 cm2
AV Area VTI: 1.44 cm2
AV Area mean vel: 1.49 cm2
AV Mean grad: 6 mm[Hg]
AV Peak grad: 10.4 mm[Hg]
Ao pk vel: 1.61 m/s
Calc EF: 57.8 %
Height: 64 in
S' Lateral: 2.4 cm
Single Plane A2C EF: 63.3 %
Single Plane A4C EF: 49.3 %
Weight: 2470 [oz_av]

## 2023-07-28 NOTE — Progress Notes (Signed)
Cardiology Office Note:    Date:  07/28/2023   ID:  Meagan Wilcox, DOB 1930-12-02, MRN [id number]  PCP:  Lauro Regulus, MD  [facility] Cardiologist:  Yvonne Kendall, MD  [facility] Electrophysiologist:  Sherryl Manges, MD   Referring MD: Lauro Regulus, MD   Chief Complaint: 3 month follow-up  History of Present Illness:    Meagan Wilcox is a 89 y.o. 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   The patient was diagnosed with A-fib/flutter in September 2020 and underwent TEE guided cardioversion.  She had recurrent tachypalpitations and was found to be in a flutter with 2-1 AV conduction.  She was started on amiodarone and underwent repeat cardioversion, however did not tolerate amiodarone due to nausea, fatigue, bradycardia.  Outpatient cardiac monitoring showed an average heart rate of 55 bpm with a 5.1% PVC burden, 10 brief atrial runs and no evidence of recurrent A-fib or flutter.  Echo at that time showed EF of 55 to 60%, grade 2 diastolic dysfunction, no wall motion abnormality, moderately elevated PASP, mild MR, mild to moderate TR.  She was found to be in recurrent a flutter with RVR in January 2023 and was placed on flecainide and nadolol by EP.  She underwent TEE guided cardioversion and was noted to be bradycardic post cardioversion leading to nadolol being discontinued.  In February 2023 she was bradycardic and flecainide was discontinued.  Repeat outpatient monitoring showed no recurrence of A-fib or flutter with 44 brief episodes of A. tach.  In April 2023 she had repeat episodes of recurrent tachypalpitations and was placed on verapamil as needed.  She underwent pacemaker implantation in June 2023 though continued to have significant fatigue and weakness, despite  discontinuation of diltiazem leading to this reinitiation.  She was seen in the ED twice in November 2023 for A-fib RVR.  She underwent AV nodal ablation in February 2024.  Patient was last seen in August 2024 and reported DOE. Echo was ordered.   Today, the patient is overall feeling better. She has occasional exertional shortness of breath and feels tired. When she feels this way, she sits down and rests. She denies chest pain. She has occasional palpitations when she exerts herself. Reports she is eating and sleeping better. Plan for echo today.  Past Medical History:  Diagnosis Date   (HFpEF) heart failure with preserved ejection fraction (HCC)    a. 01/2021 Echo: EF 55-60%, no rwma, GrII DD, nl RV fxn, mod elev PASP. Mild MR, mild-mod TR.   Abnormal mammogram, unspecified 2011   Arthritis 2002   Chronic pain syndrome 08/01/2017   GERD (gastroesophageal reflux disease)    Heart murmur 1985   Hyperlipidemia    Hypertension 2005   Hypothyroidism    Persistent atrial fibrillation/Atypical atrial flutter (HCC)    a. 04/2019 TEE/DCCV; b. 05/2019 Recurrent Aflutter-->Amio-->DCCV; c. CHA2DS2VASc = 4-->Eliquis; d. 08/2021 recurrent Afib req DCCV.  Flecainide started; e. 09/2021 Flecainide d/c'd 2/2 brady - couldn't tol bb.   PSVT (paroxysmal supraventricular tachycardia) (HCC)    a. 09/2021 Zio: Predominantly sinus rhythm (67; range 49-102).  Rare PACs with occasional PVCs (PVC burden 3.6%).  44 SVT runs-fastest 190, longest 12.9 seconds.  No sustained arrhythmias/prolonged pauses.  No significant arrhythmia identified corresponding to patient's symptoms.    Past Surgical History:  Procedure Laterality Date   APPENDECTOMY  1939   AV NODE ABLATION N/A 09/26/2022   Procedure: AV NODE ABLATION;  Surgeon: Marinus Maw, MD;  Location: [facility] INVASIVE CV LAB;  Service: Cardiovascular;  Laterality: N/A;   BREAST BIOPSY Left 02-24-14   benign   BREAST BIOPSY Left 02/11/2016   neg   BREAST BIOPSY Left  2013   neg   BREAST EXCISIONAL BIOPSY Left 2014   neg   BREAST SURGERY Left 2013   stereotactic biopsy    BREAST SURGERY Left May 2010   1 mm foci of atypical ductal hyperplasia   CARDIOVERSION N/A 05/08/2019   Procedure: CARDIOVERSION;  Surgeon: Debbe Odea, MD;  Location: [facility] ORS;  Service: Cardiovascular;  Laterality: N/A;   CARDIOVERSION N/A 05/31/2019   Procedure: CARDIOVERSION;  Surgeon: Antonieta Iba, MD;  Location: [facility] ORS;  Service: Cardiovascular;  Laterality: N/A;   CARDIOVERSION N/A 08/25/2021   Procedure: CARDIOVERSION;  Surgeon: Debbe Odea, MD;  Location: [facility] ORS;  Service: Cardiovascular;  Laterality: N/A;   CARDIOVERSION N/A 12/29/2021   Procedure: CARDIOVERSION;  Surgeon: Debbe Odea, MD;  Location: [facility] ORS;  Service: Cardiovascular;  Laterality: N/A;   cataract surgery  Bilateral 2009   COLONOSCOPY  2010   Dr. Mechele Collin   left breast biopsy   2009   PACEMAKER IMPLANT N/A 01/26/2022   Procedure: PACEMAKER IMPLANT;  Surgeon: Duke Salvia, MD;  Location: [facility] INVASIVE CV LAB;  Service: Cardiovascular;  Laterality: N/A;   TEE WITHOUT CARDIOVERSION N/A 05/08/2019   Procedure: TRANSESOPHAGEAL ECHOCARDIOGRAM (TEE);  Surgeon: Debbe Odea, MD;  Location: [facility] ORS;  Service: Cardiovascular;  Laterality: N/A;    Current Medications: Current Meds  Medication Sig   acetaminophen (TYLENOL) 500 MG tablet Take 500 mg by mouth every 6 (six) hours as needed (pain).   amitriptyline (ELAVIL) 25 MG tablet TAKE 1 TABLET BY MOUTH DAILY   amLODipine (NORVASC) 2.5 MG tablet Take 1 tablet (2.5 mg total) by mouth daily.   BIOTIN PO Take 1 tablet by mouth daily.   Camphor-Menthol-Methyl Sal (SALONPAS) 3.08-20-08 % PTCH Place 1 patch onto the skin daily as needed (pain).   ELIQUIS 5 MG TABS tablet TAKE 1 TABLET(5 MG) BY MOUTH TWICE DAILY   furosemide (LASIX) 20 MG tablet TAKE 1 TABLET(20 MG) BY MOUTH DAILY   levothyroxine (SYNTHROID) 112 MCG tablet Take 112 mcg  by mouth daily.   LORazepam (ATIVAN) 0.5 MG tablet Take 0.5 mg by mouth daily as needed for anxiety.   losartan (COZAAR) 50 MG tablet TAKE 1 TABLET(50 MG) BY MOUTH DAILY   melatonin 5 MG TABS Take 1 tablet (5 mg total) by mouth at bedtime as needed. Home med.   Multiple Vitamin (MULTIVITAMIN) capsule Take 1 capsule by mouth daily.   naproxen sodium (ALEVE) 220 MG tablet Take 220 mg by mouth daily as needed (pain.).   omeprazole (PRILOSEC) 20 MG capsule TAKE 1 CAPSULE BY MOUTH DAILY   pentosan polysulfate (ELMIRON) 100 MG capsule Take 100 mg by mouth 2 (two) times daily.   psyllium (METAMUCIL SMOOTH TEXTURE) 28 % packet Take 1 packet by mouth daily at 12 noon.   rosuvastatin (CRESTOR) 5 MG tablet TAKE 1 TABLET(5 MG) BY MOUTH DAILY     Allergies:   Erythromycin, Amoxicillin, and Penicillins   Social History   Socioeconomic History   Marital status: Single    Spouse name: Not on file   Number of children: Not on file   Years of education: Not on  file   Highest education level: Not on file  Occupational History   Not on file  Tobacco Use   Smoking status: Never   Smokeless tobacco: Never  Vaping Use   Vaping status: Never Used  Substance and Sexual Activity   Alcohol use: No    Alcohol/week: 0.0 standard drinks of alcohol   Drug use: No   Sexual activity: Not on file  Other Topics Concern   Not on file  Social History Narrative   Not on file   Social Drivers of Health   Financial Resource Strain: Low Risk  (02/02/2023)   [facility], [facility]   Overall Financial Resource Strain (CARDIA)    Difficulty of Paying Living Expenses: Not hard at all  Food Insecurity: No Food Insecurity (02/02/2023)   Received from [facility], [facility]   Hunger Vital Sign    Worried About Running Out of Food in the Last Year: Never true    Ran Out of Food in the Last Year: Never true  Transportation Needs:  No Transportation Needs (02/02/2023)   Received from [facility], [facility]   [facility] - Transportation    In the past 12 months, has lack of transportation kept you from medical appointments or from getting medications?: No    Lack of Transportation (Non-Medical): No  Physical Activity: Not on file  Stress: Not on file  Social Connections: Not on file     Family History: The patient's family history includes Arthritis in her father; Diabetes in her father; Heart disease in her father and mother; Stroke in her father.  ROS:   Please see the history of present illness.     All other systems reviewed and are negative.  EKGs/Labs/Other Studies Reviewed:    The following studies were reviewed today:  Echo today  Echo 2022   1. Left ventricular ejection fraction, by estimation, is 55 to 60%. The  left ventricle has normal function. The left ventricle has no regional  wall motion abnormalities. There is mild left ventricular hypertrophy.  Left ventricular diastolic parameters  are consistent with Grade II diastolic dysfunction (pseudonormalization).   2. Right ventricular systolic function is normal. The right ventricular  size is mildly enlarged. There is moderately elevated pulmonary artery  systolic pressure.   3. Right atrial size was mildly dilated.   4. The mitral valve is normal in structure. Mild mitral valve  regurgitation.   5. Tricuspid valve regurgitation is mild to moderate.   6. The aortic valve was not well visualized. Aortic valve regurgitation  is not visualized.   7. The inferior vena cava is normal in size with greater than 50%  respiratory variability, suggesting right atrial pressure of 3 mmHg.   Comparison(s): EF 55-60%.   Heart monitor 2022 The patient was monitored for 13 days, 22 hours. The predominant rhythm was sinus with an average rate of 55 bpm (range 40-90 bpm in sinus). Periods of junctional rhythm were  noted. There were rare PAC's and frequent PVC's (5.1% PVC burden). Ten atrial runs occurred, lasting up to 18 beats with a maximum rate of 126 bpm. No prolonged pause was observed. There obvious atrial fibrillation. Patient triggered events correspond to sinus rhythm, PVC's, and junctional rhythm.   Predominantly sinus rhythm with intermittent junctional rhythm, as well as frequent PVC's (5.1% burden).  Rare PAC's and brief PSVT also noted.  EKG:  EKG is ordered  today.  The ekg ordered today demonstrates V paced rhythm, 70bpm  Recent Labs: 09/14/2022: ALT 15; BUN 20; Creatinine, Ser 0.87; Hemoglobin 13.4; Platelets 251; Potassium 4.1; Sodium 135  Recent Lipid Panel    Component Value Date/Time   CHOL 165 09/14/2022 1058   TRIG 103 09/14/2022 1058   HDL 92 09/14/2022 1058   CHOLHDL 1.8 09/14/2022 1058   VLDL 21 09/14/2022 1058   LDLCALC 52 09/14/2022 1058    Physical Exam:    VS:  BP 126/60 (BP Location: Left Arm, Patient Position: Sitting, Cuff Size: Normal)   Pulse 70   Ht 5\' 4"  (1.626 m)   Wt 154 lb 6 oz (70 kg)   BMI 26.50 kg/m     Wt Readings from Last 3 Encounters:  07/28/23 154 lb 6 oz (70 kg)  03/27/23 151 lb 6.4 oz (68.7 kg)  01/26/23 150 lb (68 kg)     GEN:  Well nourished, well developed in no acute distress HEENT: Normal NECK: No JVD; No carotid bruits LYMPHATICS: No lymphadenopathy CARDIAC: RRR, no murmurs, rubs, gallops RESPIRATORY:  Clear to auscultation without rales, wheezing or rhonchi  ABDOMEN: Soft, non-tender, non-distended MUSCULOSKELETAL:  No edema; No deformity  SKIN: Warm and dry NEUROLOGIC:  Alert and oriented x 3 PSYCHIATRIC:  Normal affect   ASSESSMENT:    1. Persistent atrial fibrillation (HCC)   2. Typical atrial flutter (HCC)   3. Tachycardia-bradycardia syndrome (HCC)   4. Hx of atrioventricular node ablation   5. Cardiac pacemaker 01/26/22   6. Chronic heart failure with preserved ejection fraction (HFpEF) (HCC)   7. Essential  hypertension   8. Hyperlipidemia, unspecified hyperlipidemia type    PLAN:    In order of problems listed above:  Persistent Afib/flutter Tachy-brady syndrome s/p dual chamber PPM and AV nodal ablation Most recent device interrogation showed 31% Afib, longest episode lasting 11 days. She reports episodes of exertional shortness of breath, palpitations and fatigue. Suspect this may be from afib, but overall she is feeling much better than before. Continue Eliquis 5mg  BID. She follows with EP  HFpEF She is euvolemic on exam today. Continue lasix 20mg  daily. Continue Losartan 50mg  daily. Echo today after appointment.   HTN BP is good today, some labile pressures at home. No changes. Continue Losartan and amlodipine.   HLD LDL 66. Continue Crestor 5mg  daily.   Disposition: Follow up in 6 month(s) with MD/aPP  Signed, Keelon Zurn David Stall, PA-C  07/28/2023 4:04 PM    [facility]

## 2023-07-28 NOTE — Patient Instructions (Signed)
Medication Instructions:  Your physician recommends that you continue on your current medications as directed. Please refer to the Current Medication list given to you today.   *If you need a refill on your cardiac medications before your next appointment, please call your pharmacy*   Lab Work: No labs ordered today    Testing/Procedures: No test ordered today    Follow-Up: At [facility], you and your health needs are our priority.  As part of our continuing mission to provide you with exceptional heart care, we have created designated Provider Care Teams.  These Care Teams include your primary Cardiologist (physician) and Advanced Practice Providers (APPs -  Physician Assistants and Nurse Practitioners) who all work together to provide you with the care you need, when you need it.  We recommend signing up for the patient portal called "MyChart".  Sign up information is provided on this After Visit Summary.  MyChart is used to connect with patients for Virtual Visits (Telemedicine).  Patients are able to view lab/test results, encounter notes, upcoming appointments, etc.  Non-urgent messages can be sent to your provider as well.   To learn more about what you can do with MyChart, go to [email or web address].    Your next appointment:   6 month(s)  Provider:   You may see Yvonne Kendall, MD or one of the following Advanced Practice Providers on your designated Care Team:   Nicolasa Ducking, NP Eula Listen, PA-C Cadence Fransico Michael, PA-C Charlsie Quest, NP Carlos Levering, NP    Your physician recommends that you schedule a follow-up appointment with EP in 3 months.

## 2023-08-23 ENCOUNTER — Other Ambulatory Visit: Payer: Self-pay | Admitting: Internal Medicine

## 2023-09-01 NOTE — Progress Notes (Signed)
Remote pacemaker transmission.   

## 2023-10-02 ENCOUNTER — Other Ambulatory Visit: Payer: Self-pay | Admitting: Internal Medicine

## 2023-10-02 ENCOUNTER — Telehealth: Payer: Self-pay | Admitting: Internal Medicine

## 2023-10-02 MED ORDER — ROSUVASTATIN CALCIUM 5 MG PO TABS: 5.0000 mg | ORAL_TABLET | Freq: Every day | ORAL | 1 refills | Status: DC

## 2023-10-02 NOTE — Telephone Encounter (Signed)
*  STAT* If patient is at the pharmacy, call can be transferred to refill team.   1. Which medications need to be refilled? (please list name of each medication and dose if known) rosuvastatin (CRESTOR) 5 MG tablet   2. Which pharmacy/location (including street and city if local pharmacy) is medication to be sent to? [id number] - [place], [place] - [address] AT [facility] [address]   3. Do they need a 30 day or 90 day supply? 90

## 2023-10-02 NOTE — Telephone Encounter (Signed)
Last office visit: 07/28/23 with plan to f/u in 6 months Next office visit: none/does have active recall  Requested Prescriptions   Signed Prescriptions Disp Refills   rosuvastatin (CRESTOR) 5 MG tablet 90 tablet 1    Sig: Take 1 tablet (5 mg total) by mouth daily.    Authorizing Provider: END, CHRISTOPHER    Ordering User: Guerry Minors

## 2023-10-11 ENCOUNTER — Other Ambulatory Visit: Payer: Self-pay | Admitting: Internal Medicine

## 2023-10-25 ENCOUNTER — Ambulatory Visit: Payer: Medicare PPO

## 2023-10-25 DIAGNOSIS — I495 Sick sinus syndrome: Secondary | ICD-10-CM

## 2023-10-25 LAB — CUP PACEART REMOTE DEVICE CHECK
Battery Remaining Longevity: 91 mo
Battery Remaining Percentage: 83 %
Battery Voltage: 3.01 V
Brady Statistic AP VP Percent: 46 %
Brady Statistic AP VS Percent: 1 %
Brady Statistic AS VP Percent: 52 %
Brady Statistic AS VS Percent: 1 %
Brady Statistic RA Percent Paced: 25 %
Brady Statistic RV Percent Paced: 97 %
Date Time Interrogation Session: 20250312020014
Implantable Lead Connection Status: 753985
Implantable Lead Connection Status: 753985
Implantable Lead Implant Date: 20230614
Implantable Lead Implant Date: 20230614
Implantable Lead Location: 753859
Implantable Lead Location: 753860
Implantable Lead Model: 1944
Implantable Lead Model: 1948
Implantable Pulse Generator Implant Date: 20230614
Lead Channel Impedance Value: 460 Ohm
Lead Channel Impedance Value: 730 Ohm
Lead Channel Pacing Threshold Amplitude: 0.5 V
Lead Channel Pacing Threshold Amplitude: 0.75 V
Lead Channel Pacing Threshold Pulse Width: 0.5 ms
Lead Channel Pacing Threshold Pulse Width: 0.5 ms
Lead Channel Sensing Intrinsic Amplitude: 1 mV
Lead Channel Sensing Intrinsic Amplitude: 12 mV
Lead Channel Setting Pacing Amplitude: 2 V
Lead Channel Setting Pacing Amplitude: 2.5 V
Lead Channel Setting Pacing Pulse Width: 0.5 ms
Lead Channel Setting Sensing Sensitivity: 4 mV
Pulse Gen Model: 2272
Pulse Gen Serial Number: 8087558

## 2023-10-27 ENCOUNTER — Encounter: Payer: Medicare PPO | Admitting: Cardiology

## 2023-11-03 ENCOUNTER — Encounter: Payer: Self-pay | Admitting: Cardiology

## 2023-11-03 ENCOUNTER — Ambulatory Visit: Payer: Medicare PPO | Attending: Cardiology | Admitting: Cardiology

## 2023-11-03 VITALS — BP 140/64 | HR 70 | Ht 64.0 in | Wt 151.2 lb

## 2023-11-03 DIAGNOSIS — I480 Paroxysmal atrial fibrillation: Secondary | ICD-10-CM

## 2023-11-03 DIAGNOSIS — Z9889 Other specified postprocedural states: Secondary | ICD-10-CM | POA: Diagnosis not present

## 2023-11-03 DIAGNOSIS — I495 Sick sinus syndrome: Secondary | ICD-10-CM

## 2023-11-03 DIAGNOSIS — Z950 Presence of cardiac pacemaker: Secondary | ICD-10-CM | POA: Diagnosis not present

## 2023-11-03 DIAGNOSIS — D6869 Other thrombophilia: Secondary | ICD-10-CM

## 2023-11-03 LAB — CUP PACEART INCLINIC DEVICE CHECK
Battery Remaining Longevity: 92 mo
Battery Voltage: 3.01 V
Brady Statistic RA Percent Paced: 25 %
Brady Statistic RV Percent Paced: 97 %
Date Time Interrogation Session: 20250321164026
Implantable Lead Connection Status: 753985
Implantable Lead Connection Status: 753985
Implantable Lead Implant Date: 20230614
Implantable Lead Implant Date: 20230614
Implantable Lead Location: 753859
Implantable Lead Location: 753860
Implantable Lead Model: 1944
Implantable Lead Model: 1948
Implantable Pulse Generator Implant Date: 20230614
Lead Channel Impedance Value: 462.5 Ohm
Lead Channel Impedance Value: 712.5 Ohm
Lead Channel Pacing Threshold Amplitude: 0.75 V
Lead Channel Pacing Threshold Amplitude: 0.75 V
Lead Channel Pacing Threshold Pulse Width: 0.5 ms
Lead Channel Pacing Threshold Pulse Width: 0.5 ms
Lead Channel Sensing Intrinsic Amplitude: 1.3 mV
Lead Channel Setting Pacing Amplitude: 2 V
Lead Channel Setting Pacing Amplitude: 2.5 V
Lead Channel Setting Pacing Pulse Width: 0.5 ms
Lead Channel Setting Sensing Sensitivity: 4 mV
Pulse Gen Model: 2272
Pulse Gen Serial Number: 8087558

## 2023-11-03 NOTE — Patient Instructions (Signed)
 Medication Instructions:  If you decide to start Amiodarone you will need to STOP Amitriptyline (this would be by your primary care provider)   Take your Synthroid first thing in the morning, and then in 1 hour you can take other medications.   *If you need a refill on your cardiac medications before your next appointment, please call your pharmacy*   Follow-Up: At [facility], you and your health needs are our priority.  As part of our continuing mission to provide you with exceptional heart care, we have created designated Provider Care Teams.  These Care Teams include your primary Cardiologist (physician) and Advanced Practice Providers (APPs -  Physician Assistants and Nurse Practitioners) who all work together to provide you with the care you need, when you need it.  We recommend signing up for the patient portal called "MyChart".  Sign up information is provided on this After Visit Summary.  MyChart is used to connect with patients for Virtual Visits (Telemedicine).  Patients are able to view lab/test results, encounter notes, upcoming appointments, etc.  Non-urgent messages can be sent to your provider as well.   To learn more about what you can do with MyChart, go to [email or web address].    Your next appointment:   6 month(s)  Provider:   Sherie Don, NP

## 2023-11-03 NOTE — Progress Notes (Signed)
 Electrophysiology Clinic Note    Date:  11/03/2023  Patient ID:  Meagan Wilcox, Meagan Wilcox 1930-11-03, MRN [id number] PCP:  Lauro Regulus, MD  Cardiologist:  Yvonne Kendall, MD Electrophysiologist: Sherryl Manges, MD   Discussed the use of AI scribe software for clinical note transcription with the patient, who gave verbal consent to proceed.   Patient Profile    Chief Complaint: PPM, afib follow-up  History of Present Illness: Meagan Wilcox is a 89 y.o. female with PMH notable for parox AFib, tachy-brady s/p AVN ablation s/p PPM, HFpEF, HTN; seen today for Sherryl Manges, MD for routine electrophysiology followup.   She is s/p AVN ablation w Dr. Ladona Ridgel 09/2022. I saw her 01/2023 where she was overall feeling stable. She was having ongoing SOB and fatigue, thought these were stable.   On follow-up today, she continues to have fatigue and is concerned that her AFib is the cause. She does not think her fatigue is worse than it was a year ago. She questions the best times to take her medications. She recently had dental work requiring abx that caused diarrhea. She then had increased lower extremity edema. Has worked with PCP to increase lasix dose, and is now back to taking lasix PRN when weight is above 150lb.   Her niece, Darl Pikes, joins for appt whom I have met before. Darl Pikes does not live with patient.      Arrhythmia/Device History St. Jude dual chamber PPM, imp 01/2022; dx tachy-brady   Flecainide - stopped d/t SE Amiodarone - stopped d/t SE   ROS:  Please see the history of present illness. All other systems are reviewed and otherwise negative.    Physical Exam    VS:  BP (!) 140/64   Pulse 70   Ht 5\' 4"  (1.626 m)   Wt 151 lb 3.2 oz (68.6 kg)   SpO2 98%   BMI 25.95 kg/m  BMI: Body mass index is 25.95 kg/m.  Wt Readings from Last 3 Encounters:  11/03/23 151 lb 3.2 oz (68.6 kg)  07/28/23 154 lb 6 oz (70 kg)  03/27/23 151 lb 6.4 oz (68.7 kg)     GEN- The patient is  well appearing, alert and oriented x 3 today.   Lungs- Clear to ausculation bilaterally, normal work of breathing.  Heart- Regular rate and rhythm, no murmurs, rubs or gallops Extremities- No peripheral edema, warm, dry Skin-  device pocket well-healed, no tethering. Tender to palpation   Device interrogation done today and reviewed by myself:  Battery 7.4 years Lead thresholds, impedence, sensing stable  Presents in AF, VP Frequent AFib episodes, appears persistent as of 08/2023 AF burden switch 54% Slightly adjusted atrial sensitivity  Studies Reviewed   Previous EP, cardiology notes.    EKG is ordered. Personal review of EKG from today shows:    EKG Interpretation Date/Time:  Friday November 03 2023 14:45:34 EDT Ventricular Rate:  70 PR Interval:    QRS Duration:  172 QT Interval:  442 QTC Calculation: 477 R Axis:   -64  Text Interpretation: Ventricular-paced rhythm Confirmed by Sherie Don [phone]) on 11/03/2023 2:54:04 PM    TTE, 07/28/2023  1. Left ventricular ejection fraction, by estimation, is 55 to 60%. Left ventricular ejection fraction by 2D MOD biplane is 57.8 %. The left ventricle has normal function. The left ventricle has no regional wall motion abnormalities. There is mild left ventricular hypertrophy. Left ventricular diastolic parameters are indeterminate.   2. Right ventricular systolic function  is normal. The right ventricular size is normal.   3. Left atrial size was moderately dilated.   4. The mitral valve is normal in structure. Mild to moderate mitral valve regurgitation.   5. The aortic valve is tricuspid. Aortic valve regurgitation is mild. Aortic valve sclerosis is present, with no evidence of aortic valve stenosis.   TTE, 02/04/2021 1. Left ventricular ejection fraction, by estimation, is 55 to 60%. The left ventricle has normal function. The left ventricle has no regional wall motion abnormalities. There is mild left ventricular hypertrophy. Left  ventricular diastolic parameters are consistent with Grade II diastolic dysfunction (pseudonormalization).   2. Right ventricular systolic function is normal. The right ventricular size is mildly enlarged. There is moderately elevated pulmonary artery systolic pressure.   3. Right atrial size was mildly dilated.   4. The mitral valve is normal in structure. Mild mitral valve regurgitation.   5. Tricuspid valve regurgitation is mild to moderate.   6. The aortic valve was not well visualized. Aortic valve regurgitation is not visualized.   7. The inferior vena cava is normal in size with greater than 50% respiratory variability, suggesting right atrial pressure of 3 mmHg.   Comparison(s): EF 55-60%.   Assessment and Plan     #) tachy-brady #) s/p PPM #) s/p AVN ablation #) parox afib, flutter Increased AFib, flutter burden as of late Patient does not appear to be symptomatic of arrhythmia Most recent echo with normal LVEF She was previously intolerant of amiodarone d/t bradycardia but this was prior to her PPM implant Can consider initiating amiodarone, though significant interactions with patient's current medication list (amitriptyline).  Discussed that at this time, I do not believe the risks/benefits of amiodarone warrant initiation. She will continue to monitor symptoms Consider updated TTE at follow-up Consider discussing alternative to amitriptyline with PCP   #) Hypercoag d/t parox afib CHA2DS2-VASc Score = at least 5 [CHF History: 1, HTN History: 1, Diabetes History: 0, Stroke History: 0, Vascular Disease History: 0, Age Score: 2, Gender Score: 1].  Therefore, the patient's annual risk of stroke is 7.2 %.    Stroke ppx - 5mg  eliquis, appropriately dosed. Cr 0.9 on 3/25 labs via CE No bleeding concerns    #) hypothyroid Discussed taking synthroid first thing in the AM on empty stomach Wait 1 hr before eating breakfast or taking other medications       Current  medicines are reviewed at length with the patient today.   The patient has concerns regarding her medicines.  The following changes were made today:  none  Labs/ tests ordered today include:  Orders Placed This Encounter  Procedures   EKG 12-Lead     Disposition: Follow up with Dr. Graciela Husbands or EP APP in 6 months   I spent 38 minutes face-to-face with patient and her niece providing counseling, education, including reviewing of her recent TTE, labwork, and previous notes regarding her prior AAD medicaitons   Signed, Sherie Don, NP  11/03/23  4:22 PM  Electrophysiology [facility]

## 2023-11-25 ENCOUNTER — Encounter: Payer: Self-pay | Admitting: Internal Medicine

## 2023-11-27 NOTE — Progress Notes (Signed)
 With her AFib now ( not really 30 %) but over the last few months 100 %, would reprogram her VVIR to prevent the inappropriate tracking at the URL(note the 1.4%) pacing at the uppermost bin Thanks SK

## 2023-12-11 NOTE — Progress Notes (Signed)
 Remote pacemaker transmission.

## 2024-01-08 ENCOUNTER — Other Ambulatory Visit: Payer: Self-pay | Admitting: Internal Medicine

## 2024-01-08 DIAGNOSIS — I480 Paroxysmal atrial fibrillation: Secondary | ICD-10-CM

## 2024-01-09 NOTE — Telephone Encounter (Signed)
 Prescription refill request for Eliquis  received. Indication:afib Last office visit:3/25 Scr:0.9  3/25 Age: 89 Weight:68.6  kg  Prescription refilled

## 2024-01-24 ENCOUNTER — Ambulatory Visit (INDEPENDENT_AMBULATORY_CARE_PROVIDER_SITE_OTHER): Payer: Medicare PPO

## 2024-01-24 DIAGNOSIS — I495 Sick sinus syndrome: Secondary | ICD-10-CM

## 2024-01-24 LAB — CUP PACEART REMOTE DEVICE CHECK
Battery Remaining Longevity: 90 mo
Battery Remaining Percentage: 81 %
Battery Voltage: 3.01 V
Brady Statistic AP VP Percent: 15 %
Brady Statistic AP VS Percent: 0 %
Brady Statistic AS VP Percent: 79 %
Brady Statistic AS VS Percent: 2.8 %
Brady Statistic RA Percent Paced: 3.1 %
Brady Statistic RV Percent Paced: 97 %
Date Time Interrogation Session: 20250611024351
Implantable Lead Connection Status: 753985
Implantable Lead Connection Status: 753985
Implantable Lead Implant Date: 20230614
Implantable Lead Implant Date: 20230614
Implantable Lead Location: 753859
Implantable Lead Location: 753860
Implantable Lead Model: 1944
Implantable Lead Model: 1948
Implantable Pulse Generator Implant Date: 20230614
Lead Channel Impedance Value: 450 Ohm
Lead Channel Impedance Value: 690 Ohm
Lead Channel Pacing Threshold Amplitude: 0.5 V
Lead Channel Pacing Threshold Amplitude: 0.75 V
Lead Channel Pacing Threshold Pulse Width: 0.5 ms
Lead Channel Pacing Threshold Pulse Width: 0.5 ms
Lead Channel Sensing Intrinsic Amplitude: 1.3 mV
Lead Channel Sensing Intrinsic Amplitude: 12 mV
Lead Channel Setting Pacing Amplitude: 2 V
Lead Channel Setting Pacing Amplitude: 2.5 V
Lead Channel Setting Pacing Pulse Width: 0.5 ms
Lead Channel Setting Sensing Sensitivity: 4 mV
Pulse Gen Model: 2272
Pulse Gen Serial Number: 8087558

## 2024-01-25 ENCOUNTER — Telehealth: Payer: Self-pay

## 2024-01-25 NOTE — Telephone Encounter (Signed)
 I spoke with pts niece who is bringing patient to appointment. I explained why and she was very appreciative and understood. I made an appointment with AT for tomorrow to make the programming changes and assess/address a more complete picture as it does look like there is a noticeable difference in daily activity and patient is symptomatic; ie- persistent fatigue.   Changes recommended by industry rep.  - Change Sensitivity from 4mV to 2mV - Enable autocapture on RV - Enable atrial auto sensitivity at auto 0.93mV - Shorten PVAB to 70ms d/t atrial undersensing - if that ^ doesn't work consider VVI DDI Probably fusing as she does have an underlying it appears from the sensing test

## 2024-01-25 NOTE — Progress Notes (Signed)
  Electrophysiology Office Note:   ID:  Meagan Wilcox, Meagan Wilcox [birth year], MRN [id number]  Primary Cardiologist: Sammy Crisp, MD Electrophysiologist: Richardo Chandler, MD      History of Present Illness:   Meagan Wilcox is a 89 y.o. 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 .    Patient reports doing about the same. Has some fatigue but not very active. Does ADLs and self care, other than heavy objects. Doesn't mop or vacuum, but dresses, bathes, and simple food prep.  Mild chest discomfort at times, but at rest, and possible related to food -> she will pay more attention to timing around eating. Otherwise, no syncope or orthopnea. Uses lasix  with occasional extra dose with weight gain/extra fluid.   Review of systems complete and found to be negative unless listed in HPI.   EP Information / Studies Reviewed:    EKG is ordered today. Personal review as below.  EKG Interpretation Date/Time:  Friday January 26 2024 09:22:39 EDT Ventricular Rate:  70 PR Interval:    QRS Duration:  158 QT Interval:  436 QTC Calculation: 470 R Axis:   -66  Text Interpretation: Ventricular-paced rhythm When compared with ECG of 03-Nov-2023 14:45, No significant change was found Confirmed by Pilar Bridge [phone]) on 01/26/2024 9:25:58 AM    PPM Interrogation-  reviewed in detail today,  See PACEART report.  Arrhythmia/Device History ABBOTT Dual chamber PPM, imp 01/2022; dx tachy-brady [place]   Flecainide  - stopped d/t SE Amiodarone  - stopped d/t SE   Physical Exam:   VS:  BP 128/66   Pulse 70   Ht 5' 4 (1.626 m)   Wt 154 lb 9.6 oz (70.1 kg)   SpO2 97%   BMI 26.54 kg/m    Wt Readings from Last 3 Encounters:  01/26/24 154 lb 9.6 oz (70.1 kg)  11/03/23 151 lb 3.2 oz (68.6 kg)  07/28/23 154 lb 6 oz (70 kg)     GEN: No acute distress  NECK: No JVD; No carotid bruits CARDIAC: Regular rate  and rhythm (V paced), no murmurs, rubs, gallops RESPIRATORY:  Clear to auscultation without rales, wheezing or rhonchi  ABDOMEN: Soft, non-tender, non-distended EXTREMITIES:  Trace edema; No deformity   ASSESSMENT AND PLAN:    Tachy-Brady syndrome s/p Abbott PPM  Normal PPM function See Pace Art report Changed Sensitivity from 4mV to 2mV Enabled autocapture on RV Enabled atrial auto sensitivity at auto 0.66mV Shortened PVAB to 70ms d/t atrial undersensing  If these changes do not resolve undersensing, would program VVI in the setting of likely now permanent AF.   Longstanding persistent AF/AFL s/p AV nodal ablation 100% burden by device since December, suspect longer with some concern of undersensing.  Previously she was intolerant to amiodarone  d/t bradycardia (pre-pacing) but also possible interaction with amitriptyline  Would lean towards rate control alone   Secondary hypercoagulable state Pt on Eliquis  as above   Disposition:   Follow up with EP Team in 6 months  Signed, Tylene Galla, PA-C

## 2024-01-25 NOTE — Telephone Encounter (Signed)
 PPM scheduled remote reviewed. Normal device function.  Presenting rhythm: AF-VP with undersensing of R waves x 2, functional RV LOC d/t undesensing. Also noted on AMS # 1 event EGM. Routing to triage per protocol.  Persistent AF since December. On OAC per chart.    Next remote 91 days. AB, CVRS  Spoke w/ Geraldean Klein and he recommended changing sensitivity to 2 and enabling autocapture.   Called patient. Has been very fatigued as of late. C/o CP that she thinks is indigestion. Waiting on patients niece to call back.

## 2024-01-26 ENCOUNTER — Encounter: Payer: Self-pay | Admitting: Student

## 2024-01-26 ENCOUNTER — Ambulatory Visit: Attending: Student | Admitting: Student

## 2024-01-26 VITALS — BP 128/66 | HR 70 | Ht 64.0 in | Wt 154.6 lb

## 2024-01-26 DIAGNOSIS — Z9889 Other specified postprocedural states: Secondary | ICD-10-CM | POA: Diagnosis not present

## 2024-01-26 DIAGNOSIS — Z950 Presence of cardiac pacemaker: Secondary | ICD-10-CM | POA: Diagnosis not present

## 2024-01-26 DIAGNOSIS — I480 Paroxysmal atrial fibrillation: Secondary | ICD-10-CM

## 2024-01-26 DIAGNOSIS — I495 Sick sinus syndrome: Secondary | ICD-10-CM

## 2024-01-26 DIAGNOSIS — D6869 Other thrombophilia: Secondary | ICD-10-CM

## 2024-01-26 LAB — CUP PACEART INCLINIC DEVICE CHECK
Battery Remaining Longevity: 106 mo
Battery Voltage: 3.01 V
Brady Statistic RA Percent Paced: 3.1 %
Brady Statistic RV Percent Paced: 97 %
Date Time Interrogation Session: 20250613095848
Implantable Lead Connection Status: 753985
Implantable Lead Connection Status: 753985
Implantable Lead Implant Date: 20230614
Implantable Lead Implant Date: 20230614
Implantable Lead Location: 753859
Implantable Lead Location: 753860
Implantable Lead Model: 1944
Implantable Lead Model: 1948
Implantable Pulse Generator Implant Date: 20230614
Lead Channel Impedance Value: 450 Ohm
Lead Channel Impedance Value: 687.5 Ohm
Lead Channel Pacing Threshold Amplitude: 0.5 V
Lead Channel Pacing Threshold Pulse Width: 0.5 ms
Lead Channel Sensing Intrinsic Amplitude: 0.7 mV
Lead Channel Sensing Intrinsic Amplitude: 10.8 mV
Lead Channel Setting Pacing Amplitude: 0.75 V
Lead Channel Setting Pacing Amplitude: 2 V
Lead Channel Setting Pacing Pulse Width: 0.5 ms
Lead Channel Setting Sensing Sensitivity: 2 mV
Pulse Gen Model: 2272
Pulse Gen Serial Number: 8087558

## 2024-01-26 NOTE — Patient Instructions (Signed)
 Medication Instructions:  No medication changes today. *If you need a refill on your cardiac medications before your next appointment, please call your pharmacy*  Lab Work: No labwork ordered today. If you have labs (blood work) drawn today and your tests are completely normal, you will receive your results only by: MyChart Message (if you have MyChart) OR A paper copy in the mail If you have any lab test that is abnormal or we need to change your treatment, we will call you to review the results.  Testing/Procedures: No testing ordered today  Follow-Up: At [facility], you and your health needs are our priority.  As part of our continuing mission to provide you with exceptional heart care, our providers are all part of one team.  This team includes your primary Cardiologist (physician) and Advanced Practice Providers or APPs (Physician Assistants and Nurse Practitioners) who all work together to provide you with the care you need, when you need it.  Your next appointment:   6 month(s)  Provider:   You may see Richardo Chandler, MD or one of the following Advanced Practice Providers on your designated Care Team:   Mertha Abrahams, PA-C Sandon Yoho Andy Dillard Pascal, PA-C Suzann Riddle, NP Creighton Doffing, NP    We recommend signing up for the patient portal called MyChart.  Sign up information is provided on this After Visit Summary.  MyChart is used to connect with patients for Virtual Visits (Telemedicine).  Patients are able to view lab/test results, encounter notes, upcoming appointments, etc.  Non-urgent messages can be sent to your provider as well.   To learn more about what you can do with MyChart, go to [email or web address].

## 2024-02-09 ENCOUNTER — Ambulatory Visit: Payer: Self-pay | Admitting: Cardiology

## 2024-03-01 ENCOUNTER — Ambulatory Visit: Admitting: Medical

## 2024-03-01 ENCOUNTER — Encounter: Payer: Self-pay | Admitting: Medical

## 2024-03-01 ENCOUNTER — Ambulatory Visit: Attending: Medical | Admitting: Medical

## 2024-03-01 VITALS — BP 140/63 | HR 70 | Ht 63.0 in | Wt 152.4 lb

## 2024-03-01 DIAGNOSIS — I495 Sick sinus syndrome: Secondary | ICD-10-CM | POA: Diagnosis not present

## 2024-03-01 DIAGNOSIS — E782 Mixed hyperlipidemia: Secondary | ICD-10-CM

## 2024-03-01 DIAGNOSIS — I4819 Other persistent atrial fibrillation: Secondary | ICD-10-CM | POA: Diagnosis not present

## 2024-03-01 DIAGNOSIS — I10 Essential (primary) hypertension: Secondary | ICD-10-CM | POA: Diagnosis not present

## 2024-03-01 DIAGNOSIS — I5032 Chronic diastolic (congestive) heart failure: Secondary | ICD-10-CM | POA: Diagnosis not present

## 2024-03-01 NOTE — Progress Notes (Signed)
 Cardiology Office Note   Date:  03/01/2024  ID:  Meagan Wilcox, DOB 05-22-1931, MRN [id number] PCP: Lenon Layman ORN, MD  [facility] Providers Cardiologist:  Lonni Hanson, MD Electrophysiologist:  Elspeth Sage, MD    History of Present Illness Meagan Wilcox is a 89 y.o. 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    The patient was diagnosed with A-fib/flutter in September 2020 and underwent TEE guided cardioversion.  She had recurrent tachypalpitations and was found to be in a flutter with 2-1 AV conduction.  She was started on amiodarone  and underwent repeat cardioversion, however did not tolerate amiodarone  due to nausea, fatigue, bradycardia.  Outpatient cardiac monitoring showed an average heart rate of 55 bpm with a 5.1% PVC burden, 10 brief atrial runs and no evidence of recurrent A-fib or flutter.  Echo at that time showed EF of 55 to 60%, grade 2 diastolic dysfunction, no wall motion abnormality, moderately elevated PASP, mild MR, mild to moderate TR.  She was found to be in recurrent a flutter with RVR in January 2023 and was placed on flecainide  and nadolol by EP.  She underwent TEE guided cardioversion and was noted to be bradycardic post cardioversion leading to nadolol being discontinued.  In February 2023 she was bradycardic and flecainide  was discontinued.  Repeat outpatient monitoring showed no recurrence of A-fib or flutter with 44 brief episodes of A. tach.  In April 2023 she had repeat episodes of recurrent tachypalpitations and was placed on verapamil  as needed.  She underwent pacemaker implantation in June 2023 though continued to have significant fatigue and weakness, despite discontinuation of diltiazem  leading to this reinitiation.  She was seen in the ED twice in  November 2023 for A-fib RVR.  She underwent AV nodal ablation in February 2024.  Echo 07/2023 for DOE showed LVEF 55-60%, no WMA, mild LVH, mild AI.  Device interrogation showed increase Afib burden and she was brought in 01/26/24 and her device was reprogrammed.   Today, the patient is overall feeling better. She does feel tired throughout the day, which seems to be normal for her. She denies chest pain. She has occasional SOB when she does activity, but resolves with rest. She denies lower leg edema. She wears compression socks and takes Torsemide as needed.   Studies Reviewed EKG Interpretation Date/Time:  Friday March 01 2024 11:45:02 EDT Ventricular Rate:  70 PR Interval:    QRS Duration:  172 QT Interval:  446 QTC Calculation: 481 R Axis:   -71  Text Interpretation: Ventricular-paced rhythm When compared with ECG of 26-Jan-2024 09:22, No significant change was found Confirmed by Franchester, Everhett Bozard ([id number]) on 03/01/2024 11:49:18 AM    Echo 07/2023  1. Left ventricular ejection fraction, by estimation, is 55 to 60%. Left  ventricular ejection fraction by 2D MOD biplane is 57.8 %. The left  ventricle has normal function. The left ventricle has no regional wall  motion abnormalities. There is mild left  ventricular hypertrophy. Left ventricular diastolic parameters are  indeterminate.   2. Right ventricular systolic function is normal. The right ventricular  size is normal.   3. Left atrial size was moderately dilated.   4. The mitral valve is normal in structure. Mild to moderate mitral valve  regurgitation.   5. The aortic valve is tricuspid. Aortic valve  regurgitation is mild.  Aortic valve sclerosis is present, with no evidence of aortic valve  stenosis.   Echo 2022  1. Left ventricular ejection fraction, by estimation, is 55 to 60%. The  left ventricle has normal function. The left ventricle has no regional  wall motion abnormalities. There is mild left ventricular hypertrophy.   Left ventricular diastolic parameters  are consistent with Grade II diastolic dysfunction (pseudonormalization).   2. Right ventricular systolic function is normal. The right ventricular  size is mildly enlarged. There is moderately elevated pulmonary artery  systolic pressure.   3. Right atrial size was mildly dilated.   4. The mitral valve is normal in structure. Mild mitral valve  regurgitation.   5. Tricuspid valve regurgitation is mild to moderate.   6. The aortic valve was not well visualized. Aortic valve regurgitation  is not visualized.   7. The inferior vena cava is normal in size with greater than 50%  respiratory variability, suggesting right atrial pressure of 3 mmHg.   Comparison(s): EF 55-60%.    Heart monitor 2022 The patient was monitored for 13 days, 22 hours. The predominant rhythm was sinus with an average rate of 55 bpm (range 40-90 bpm in sinus). Periods of junctional rhythm were noted. There were rare PAC's and frequent PVC's (5.1% PVC burden). Ten atrial runs occurred, lasting up to 18 beats with a maximum rate of 126 bpm. No prolonged pause was observed. There obvious atrial fibrillation. Patient triggered events correspond to sinus rhythm, PVC's, and junctional rhythm.   Predominantly sinus rhythm with intermittent junctional rhythm, as well as frequent PVC's (5.1% burden).  Rare PAC's and brief PSVT also noted.  Physical Exam VS:  BP (!) 140/63 (BP Location: Left Arm, Patient Position: Sitting, Cuff Size: Normal)   Pulse 70   Ht 5' 3 (1.6 m)   Wt 152 lb 6.4 oz (69.1 kg)   SpO2 99%   BMI 27.00 kg/m        Wt Readings from Last 3 Encounters:  03/01/24 152 lb 6.4 oz (69.1 kg)  01/26/24 154 lb 9.6 oz (70.1 kg)  11/03/23 151 lb 3.2 oz (68.6 kg)    GEN: Well nourished, well developed in no acute distress NECK: No JVD; No carotid bruits CARDIAC: RRR, no murmurs, rubs, gallops RESPIRATORY:  Clear to auscultation without rales, wheezing or rhonchi   ABDOMEN: Soft, non-tender, non-distended EXTREMITIES:  No edema; No deformity   ASSESSMENT AND PLAN  Persistent Afib/flutter Tachy-brady syndrome s/p PPM The patient reports fatigue has improved. EKG shows V paced rhythm with a heart rate of 70bpm. Continue Eliquis  5mg  BID. She is followed by EP.   HFpEF The patient is euvolemic on exam. She takes daily weights, wears compression socks, and eats low salt. Continue lasix  20mg  daily/PRN.  HTN BP is reasonable. Continue amlodipine  2.5mg  daily, Losartan  50mg  daily.   HLD LDL 59. Continue Crestor  5mg  daily.        Dispo: Follow-up in 6 months  Signed, Jessenia Corretjar Collazo, CMA

## 2024-03-01 NOTE — Patient Instructions (Signed)
 Medication Instructions:  Your physician recommends that you continue on your current medications as directed. Please refer to the Current Medication list given to you today.    *If you need a refill on your cardiac medications before your next appointment, please call your pharmacy*  Lab Work: No labs ordered today    Testing/Procedures: No test ordered today   Follow-Up: At [facility], you and your health needs are our priority.  As part of our continuing mission to provide you with exceptional heart care, our providers are all part of one team.  This team includes your primary Cardiologist (physician) and Advanced Practice Providers or APPs (Physician Assistants and Nurse Practitioners) who all work together to provide you with the care you need, when you need it.  Your next appointment:   6 month(s)  Provider:   Lonni Hanson, MD or Cadence Franchester, PA-C

## 2024-03-20 ENCOUNTER — Ambulatory Visit: Payer: Self-pay | Admitting: Cardiology

## 2024-03-20 NOTE — Progress Notes (Signed)
 Remote pacemaker transmission.

## 2024-03-25 ENCOUNTER — Other Ambulatory Visit: Payer: Self-pay | Admitting: Internal Medicine

## 2024-04-24 ENCOUNTER — Ambulatory Visit: Payer: Medicare PPO

## 2024-04-24 DIAGNOSIS — I495 Sick sinus syndrome: Secondary | ICD-10-CM | POA: Diagnosis not present

## 2024-04-25 LAB — CUP PACEART REMOTE DEVICE CHECK
Battery Remaining Longevity: 98 mo
Battery Remaining Percentage: 78 %
Battery Voltage: 3.01 V
Brady Statistic AP VP Percent: 0 %
Brady Statistic AP VS Percent: 0 %
Brady Statistic AS VP Percent: 0 %
Brady Statistic AS VS Percent: 0 %
Brady Statistic RA Percent Paced: 1 %
Brady Statistic RV Percent Paced: 95 %
Date Time Interrogation Session: 20250910020015
Implantable Lead Connection Status: 753985
Implantable Lead Connection Status: 753985
Implantable Lead Implant Date: 20230614
Implantable Lead Implant Date: 20230614
Implantable Lead Location: 753859
Implantable Lead Location: 753860
Implantable Lead Model: 1944
Implantable Lead Model: 1948
Implantable Pulse Generator Implant Date: 20230614
Lead Channel Impedance Value: 430 Ohm
Lead Channel Impedance Value: 650 Ohm
Lead Channel Pacing Threshold Amplitude: 0.5 V
Lead Channel Pacing Threshold Amplitude: 0.5 V
Lead Channel Pacing Threshold Pulse Width: 0.5 ms
Lead Channel Pacing Threshold Pulse Width: 0.5 ms
Lead Channel Sensing Intrinsic Amplitude: 0.7 mV
Lead Channel Sensing Intrinsic Amplitude: 7.2 mV
Lead Channel Setting Pacing Amplitude: 0.75 V
Lead Channel Setting Pacing Amplitude: 2 V
Lead Channel Setting Pacing Pulse Width: 0.5 ms
Lead Channel Setting Sensing Sensitivity: 2 mV
Pulse Gen Model: 2272
Pulse Gen Serial Number: 8087558

## 2024-05-03 NOTE — Progress Notes (Signed)
 Remote PPM Transmission

## 2024-05-06 ENCOUNTER — Ambulatory Visit: Payer: Self-pay | Admitting: Cardiology

## 2024-05-18 ENCOUNTER — Other Ambulatory Visit: Payer: Self-pay | Admitting: Internal Medicine

## 2024-06-22 ENCOUNTER — Other Ambulatory Visit: Payer: Self-pay | Admitting: Internal Medicine

## 2024-07-04 ENCOUNTER — Other Ambulatory Visit: Payer: Self-pay | Admitting: Internal Medicine

## 2024-07-10 ENCOUNTER — Other Ambulatory Visit: Payer: Self-pay | Admitting: Internal Medicine

## 2024-07-10 DIAGNOSIS — I480 Paroxysmal atrial fibrillation: Secondary | ICD-10-CM

## 2024-07-10 NOTE — Telephone Encounter (Signed)
 Eliquis  5mg  refill request received. Patient is 89 years old, weight-69.1kg, Crea-0.8 on 02/02/24, Diagnosis-Afib, and last seen by Cadence Furth on 03/01/24. Dose is appropriate based on dosing criteria. Will send in refill to requested pharmacy.

## 2024-07-24 ENCOUNTER — Ambulatory Visit: Payer: Medicare PPO

## 2024-07-25 LAB — CUP PACEART REMOTE DEVICE CHECK
Battery Remaining Longevity: 95 mo
Battery Remaining Percentage: 75 %
Battery Voltage: 3.01 V
Brady Statistic AP VP Percent: 0 %
Brady Statistic AP VS Percent: 0 %
Brady Statistic AS VP Percent: 0 %
Brady Statistic AS VS Percent: 0 %
Brady Statistic RA Percent Paced: 1 %
Brady Statistic RV Percent Paced: 96 %
Date Time Interrogation Session: 20251210020014
Implantable Lead Connection Status: 753985
Implantable Lead Connection Status: 753985
Implantable Lead Implant Date: 20230614
Implantable Lead Implant Date: 20230614
Implantable Lead Location: 753859
Implantable Lead Location: 753860
Implantable Lead Model: 1944
Implantable Lead Model: 1948
Implantable Pulse Generator Implant Date: 20230614
Lead Channel Impedance Value: 450 Ohm
Lead Channel Impedance Value: 680 Ohm
Lead Channel Pacing Threshold Amplitude: 0.5 V
Lead Channel Pacing Threshold Amplitude: 0.5 V
Lead Channel Pacing Threshold Pulse Width: 0.5 ms
Lead Channel Pacing Threshold Pulse Width: 0.5 ms
Lead Channel Sensing Intrinsic Amplitude: 0.7 mV
Lead Channel Sensing Intrinsic Amplitude: 3.2 mV
Lead Channel Setting Pacing Amplitude: 0.75 V
Lead Channel Setting Pacing Amplitude: 2 V
Lead Channel Setting Pacing Pulse Width: 0.5 ms
Lead Channel Setting Sensing Sensitivity: 2 mV
Pulse Gen Model: 2272
Pulse Gen Serial Number: 8087558

## 2024-07-29 ENCOUNTER — Telehealth: Payer: Self-pay | Admitting: Student

## 2024-07-29 NOTE — Telephone Encounter (Signed)
 Patient would like clarification in regard to a message that she received about her remote transmission on 07/24/24. Please advise.

## 2024-07-29 NOTE — Telephone Encounter (Signed)
 Patient/niece are confused by the report that posts to my chart. It is showing a lot of exclamation points and red flags that do not make sense to them.   I reviewed the transmission and provided reassurance that the device is working normally and everything is okay with her transmission.  They will be receiving also a message from the doctor after he reviews and they should use that for clarification as the my chart results do not accurately reflect what is really recorded on the device.

## 2024-07-31 NOTE — Progress Notes (Signed)
 Remote PPM Transmission

## 2024-08-02 ENCOUNTER — Ambulatory Visit: Payer: Self-pay | Admitting: Cardiology

## 2024-09-04 ENCOUNTER — Encounter: Payer: Self-pay | Admitting: Internal Medicine

## 2024-09-04 ENCOUNTER — Ambulatory Visit: Attending: Internal Medicine | Admitting: Internal Medicine

## 2024-09-04 VITALS — BP 148/72 | HR 70 | Ht 63.0 in | Wt 153.8 lb

## 2024-09-04 DIAGNOSIS — I10 Essential (primary) hypertension: Secondary | ICD-10-CM

## 2024-09-04 DIAGNOSIS — I4821 Permanent atrial fibrillation: Secondary | ICD-10-CM | POA: Diagnosis not present

## 2024-09-04 DIAGNOSIS — I5032 Chronic diastolic (congestive) heart failure: Secondary | ICD-10-CM

## 2024-09-04 DIAGNOSIS — Z79899 Other long term (current) drug therapy: Secondary | ICD-10-CM

## 2024-09-04 DIAGNOSIS — E782 Mixed hyperlipidemia: Secondary | ICD-10-CM | POA: Diagnosis not present

## 2024-09-04 DIAGNOSIS — Z9889 Other specified postprocedural states: Secondary | ICD-10-CM | POA: Diagnosis not present

## 2024-09-04 NOTE — Progress Notes (Signed)
 " Cardiology Office Note:  .   Date:  09/04/2024  ID:  Meagan Wilcox, DOB 1931/02/26, MRN [id number] PCP: Meagan Wilcox ORN, MD  [facility] Providers Cardiologist:  Meagan Hanson, MD Electrophysiologist:  Meagan Sage, MD (Inactive)     History of Present Illness: .   Meagan Wilcox is a 89 y.o. female with history of atrial fibrillation/flutter complicated by tachy-brady syndrome status post dual-chamber pacemaker and AV node ablation, chronic HFpEF, hypertension, hyperlipidemia, hypothyroidism, GERD, and arthritis, presents for follow-up of atrial fibrillation/flutter and HFpEF.  She was last seen in our office in July by Meagan Wilcox, Meagan Wilcox, at which time she noted some fatigue and exertional dyspnea but was overall feeling better than at prior visits.  Today, Meagan Wilcox reports that she has been feeling fairly well.  She notes that she fell yesterday while trying to move her recycle bin.  She lost her balance and fell back, landing on her buttocks.  She has some mild soreness there.  She denies bleeding as well as numbness/weakness in her legs.  She applied heat and ice yesterday evening and took some acetaminophen  before going to bed.  She has otherwise felt well other than sporadic palpitations when active.  She denies chest pain and shortness of breath.  She has mild lower extremity edema from time to time, though this has been well-controlled with compression stockings.  She notes that her blood pressure at home is typically better than today's reading in the office with systolic readings usually between 120 and 140 mmHg.  ROS: See HPI  Studies Reviewed: Meagan Wilcox   EKG Interpretation Date/Time:  Wednesday September 04 2024 10:02:09 EST Ventricular Rate:  70 PR Interval:    QRS Duration:  172 QT Interval:  452 QTC Calculation: 488 R Axis:   -73  Text Interpretation: Ventricular-paced rhythm Abnormal ECG When compared with ECG of 01-Mar-2024 11:45, No significant change was found  Confirmed by Meagan Wilcox [phone]) on 09/04/2024 5:25:45 PM    Risk Assessment/Calculations:    CHA2DS2-VASc Score = 5   This indicates a 7.2% annual risk of stroke. The patient's score is based upon: CHF History: 1 HTN History: 1 Diabetes History: 0 Stroke History: 0 Vascular Disease History: 0 Age Score: 2 Gender Score: 1    HYPERTENSION CONTROL Vitals:   09/04/24 0953 09/04/24 1020  BP: (!) 150/65 (!) 148/72    The patient's blood pressure is elevated above target today.  In order to address the patient's elevated BP: Blood pressure will be monitored at home to determine if medication changes need to be made.          Physical Exam:   VS:  BP (!) 148/72   Pulse 70 Comment: 70 oximeter  Ht 5' 3 (1.6 m)   Wt 153 lb 12.8 oz (69.8 kg)   SpO2 97%   BMI 27.24 kg/m    Wt Readings from Last 3 Encounters:  09/04/24 153 lb 12.8 oz (69.8 kg)  03/01/24 152 lb 6.4 oz (69.1 kg)  01/26/24 154 lb 9.6 oz (70.1 kg)    General:  NAD. Neck: No JVD or HJR. Lungs: Clear to auscultation bilaterally without wheezes or crackles. Heart: Regular rate and rhythm with 1/6 systolic murmur. Abdomen: Soft, nontender, nondistended. Extremities: Trace ankle edema bilaterally.  ASSESSMENT AND PLAN: .    Permanent atrial fibrillation status post AV node ablation: [name] is feeling well.  She remains ventricularly paced status post AV node ablation.  Continue indefinite anticoagulation  with apixaban  5 mg twice daily based on age, weight, and creatinine.  CBC has not been checked for almost a year; we will draw 1 today.  Ongoing follow-up through the EP/device clinic.  Chronic HFpEF: Mild lower extremity edema is stable.  Continue compression stocking and low-dose furosemide  use.  Hypertension: Blood pressure mildly elevated today but typically better at home.  We have agreed to continue monitoring blood pressure without medication changes today; continue amlodipine  2.5 mg daily and  losartan  50 mg daily.  Hyperlipidemia: Continue rosuvastatin  5 mg daily.  Recent lipid panel showed excellent LDL of 53.    Dispo: Follow-up with Meagan Riddle, NP, in June.  Return to see me in 1 year.  Signed, Meagan Hanson, MD  "

## 2024-09-04 NOTE — Patient Instructions (Signed)
 Medication Instructions:  Your physician recommends that you continue on your current medications as directed. Please refer to the Current Medication list given to you today.    *If you need a refill on your cardiac medications before your next appointment, please call your pharmacy*  Lab Work: Your provider would like for you to have following labs drawn today CBC.     Testing/Procedures: No test ordered today   Follow-Up: At [facility], you and your health needs are our priority.  As part of our continuing mission to provide you with exceptional heart care, our providers are all part of one team.  This team includes your primary Cardiologist (physician) and Advanced Practice Providers or APPs (Physician Assistants and Nurse Practitioners) who all work together to provide you with the care you need, when you need it.  Your next appointment:   5 month(s)  Provider:   Suzann Riddle, NP    Your physician recommends that you schedule a follow-up appointment in 1 year with Dr. Mady

## 2024-09-05 ENCOUNTER — Ambulatory Visit: Payer: Self-pay | Admitting: Internal Medicine

## 2024-09-05 LAB — CBC
Hematocrit: 36.2 % (ref 34.0–46.6)
Hemoglobin: 12.1 g/dL (ref 11.1–15.9)
MCH: 31.7 pg (ref 26.6–33.0)
MCHC: 33.4 g/dL (ref 31.5–35.7)
MCV: 95 fL (ref 79–97)
Platelets: 174 x10E3/uL (ref 150–450)
RBC: 3.82 x10E6/uL (ref 3.77–5.28)
RDW: 12.7 % (ref 11.7–15.4)
WBC: 7.8 x10E3/uL (ref 3.4–10.8)

## 2024-09-20 ENCOUNTER — Other Ambulatory Visit: Payer: Self-pay | Admitting: Internal Medicine

## 2024-10-23 ENCOUNTER — Ambulatory Visit

## 2024-11-20 ENCOUNTER — Ambulatory Visit: Admitting: Internal Medicine

## 2025-01-22 ENCOUNTER — Ambulatory Visit

## 2025-02-04 ENCOUNTER — Ambulatory Visit: Admitting: Cardiology

## 2025-04-23 ENCOUNTER — Ambulatory Visit

## 2025-07-23 ENCOUNTER — Ambulatory Visit

## 2025-10-22 ENCOUNTER — Ambulatory Visit
